# Patient Record
Sex: Female | Born: 1940
Health system: Southern US, Community
[De-identification: ages and names within clinical notes are randomized; demographics above are authoritative.]

## PROBLEM LIST (undated history)

## (undated) DIAGNOSIS — J189 Pneumonia, unspecified organism: Secondary | ICD-10-CM

## (undated) DIAGNOSIS — E785 Hyperlipidemia, unspecified: Secondary | ICD-10-CM

## (undated) DIAGNOSIS — K219 Gastro-esophageal reflux disease without esophagitis: Secondary | ICD-10-CM

## (undated) DIAGNOSIS — F32A Depression, unspecified: Secondary | ICD-10-CM

## (undated) DIAGNOSIS — N189 Chronic kidney disease, unspecified: Secondary | ICD-10-CM

## (undated) DIAGNOSIS — M353 Polymyalgia rheumatica: Secondary | ICD-10-CM

## (undated) DIAGNOSIS — M545 Low back pain, unspecified: Secondary | ICD-10-CM

## (undated) DIAGNOSIS — M1990 Unspecified osteoarthritis, unspecified site: Secondary | ICD-10-CM

## (undated) DIAGNOSIS — I10 Essential (primary) hypertension: Secondary | ICD-10-CM

## (undated) DIAGNOSIS — R42 Dizziness and giddiness: Secondary | ICD-10-CM

## (undated) DIAGNOSIS — F419 Anxiety disorder, unspecified: Secondary | ICD-10-CM

## (undated) DIAGNOSIS — Z8719 Personal history of other diseases of the digestive system: Secondary | ICD-10-CM

## (undated) DIAGNOSIS — I350 Nonrheumatic aortic (valve) stenosis: Secondary | ICD-10-CM

## (undated) DIAGNOSIS — J020 Streptococcal pharyngitis: Secondary | ICD-10-CM

## (undated) DIAGNOSIS — G8929 Other chronic pain: Secondary | ICD-10-CM

## (undated) DIAGNOSIS — M810 Age-related osteoporosis without current pathological fracture: Secondary | ICD-10-CM

## (undated) DIAGNOSIS — R011 Cardiac murmur, unspecified: Secondary | ICD-10-CM

## (undated) DIAGNOSIS — R0600 Dyspnea, unspecified: Secondary | ICD-10-CM

## (undated) DIAGNOSIS — F329 Major depressive disorder, single episode, unspecified: Secondary | ICD-10-CM

## (undated) HISTORY — DX: Age-related osteoporosis without current pathological fracture: M81.0

## (undated) HISTORY — DX: Chronic kidney disease, unspecified: N18.9

## (undated) HISTORY — DX: Nonrheumatic aortic (valve) stenosis: I35.0

## (undated) HISTORY — PX: CATARACT EXTRACTION W/ INTRAOCULAR LENS  IMPLANT, BILATERAL: SHX1307

## (undated) HISTORY — DX: Dizziness and giddiness: R42

## (undated) HISTORY — PX: JOINT REPLACEMENT: SHX530

## (undated) HISTORY — PX: TOTAL KNEE ARTHROPLASTY: SHX125

## (undated) HISTORY — DX: Streptococcal pharyngitis: J02.0

## (undated) HISTORY — DX: Hyperlipidemia, unspecified: E78.5

## (undated) HISTORY — DX: Unspecified osteoarthritis, unspecified site: M19.90

## (undated) HISTORY — DX: Essential (primary) hypertension: I10

## (undated) HISTORY — PX: BILATERAL CARPAL TUNNEL RELEASE: SHX6508

## (undated) HISTORY — PX: ABDOMINAL HYSTERECTOMY: SHX81

## (undated) HISTORY — PX: TOOTH EXTRACTION: SHX859

---

## 2001-04-03 HISTORY — PX: SHOULDER ARTHROSCOPY: SHX128

## 2002-03-03 ENCOUNTER — Encounter: Payer: Self-pay | Admitting: Orthopedic Surgery

## 2002-03-06 ENCOUNTER — Inpatient Hospital Stay (HOSPITAL_COMMUNITY): Admission: RE | Admit: 2002-03-06 | Discharge: 2002-03-11 | Payer: Self-pay | Admitting: Orthopedic Surgery

## 2002-03-06 DIAGNOSIS — Z96652 Presence of left artificial knee joint: Secondary | ICD-10-CM | POA: Insufficient documentation

## 2004-09-29 ENCOUNTER — Ambulatory Visit (HOSPITAL_BASED_OUTPATIENT_CLINIC_OR_DEPARTMENT_OTHER): Admission: RE | Admit: 2004-09-29 | Discharge: 2004-09-29 | Payer: Self-pay | Admitting: Orthopedic Surgery

## 2004-09-29 ENCOUNTER — Ambulatory Visit (HOSPITAL_COMMUNITY): Admission: RE | Admit: 2004-09-29 | Discharge: 2004-09-29 | Payer: Self-pay | Admitting: Orthopedic Surgery

## 2006-02-01 ENCOUNTER — Encounter: Admission: RE | Admit: 2006-02-01 | Discharge: 2006-02-01 | Payer: Self-pay | Admitting: Orthopedic Surgery

## 2006-02-27 ENCOUNTER — Encounter: Admission: RE | Admit: 2006-02-27 | Discharge: 2006-02-27 | Payer: Self-pay | Admitting: Orthopedic Surgery

## 2009-05-22 ENCOUNTER — Inpatient Hospital Stay (HOSPITAL_COMMUNITY): Admission: EM | Admit: 2009-05-22 | Discharge: 2009-05-22 | Payer: Self-pay | Admitting: Emergency Medicine

## 2009-06-01 ENCOUNTER — Encounter: Payer: Self-pay | Admitting: Cardiovascular Disease

## 2009-06-22 ENCOUNTER — Inpatient Hospital Stay (HOSPITAL_COMMUNITY): Admission: RE | Admit: 2009-06-22 | Discharge: 2009-06-26 | Payer: Self-pay | Admitting: Orthopedic Surgery

## 2009-06-22 DIAGNOSIS — Z96651 Presence of right artificial knee joint: Secondary | ICD-10-CM | POA: Insufficient documentation

## 2010-06-22 LAB — BASIC METABOLIC PANEL
BUN: 29 mg/dL — ABNORMAL HIGH (ref 6–23)
CO2: 24 mEq/L (ref 19–32)
Calcium: 8.7 mg/dL (ref 8.4–10.5)
Chloride: 103 mEq/L (ref 96–112)
Creatinine, Ser: 1.22 mg/dL — ABNORMAL HIGH (ref 0.4–1.2)
GFR calc Af Amer: 53 mL/min — ABNORMAL LOW (ref >60)
GFR calc non Af Amer: 44 mL/min — ABNORMAL LOW (ref >60)
Glucose, Bld: 131 mg/dL — ABNORMAL HIGH (ref 70–99)
Potassium: 4.2 mEq/L (ref 3.5–5.1)
Sodium: 133 mEq/L — ABNORMAL LOW (ref 135–145)

## 2010-06-22 LAB — URINE MICROSCOPIC-ADD ON

## 2010-06-22 LAB — DIFFERENTIAL
Basophils Absolute: 0 10*3/uL (ref 0.0–0.1)
Basophils Relative: 0 % (ref 0–1)
Eosinophils Absolute: 0 10*3/uL (ref 0.0–0.7)
Eosinophils Relative: 0 % (ref 0–5)
Lymphocytes Relative: 5 % — ABNORMAL LOW (ref 12–46)
Lymphs Abs: 0.9 10*3/uL (ref 0.7–4.0)
Monocytes Absolute: 0.9 10*3/uL (ref 0.1–1.0)
Monocytes Relative: 5 % (ref 3–12)
Neutro Abs: 15.3 10*3/uL — ABNORMAL HIGH (ref 1.7–7.7)
Neutrophils Relative %: 89 % — ABNORMAL HIGH (ref 43–77)

## 2010-06-22 LAB — APTT: aPTT: 28 seconds (ref 24–37)

## 2010-06-22 LAB — URINALYSIS, ROUTINE W REFLEX MICROSCOPIC
Bilirubin Urine: NEGATIVE
Glucose, UA: NEGATIVE mg/dL
Ketones, ur: NEGATIVE mg/dL
Nitrite: NEGATIVE
Protein, ur: NEGATIVE mg/dL
Specific Gravity, Urine: 1.012 (ref 1.005–1.030)
Urobilinogen, UA: 0.2 mg/dL (ref 0.0–1.0)
pH: 5 (ref 5.0–8.0)

## 2010-06-22 LAB — CK TOTAL AND CKMB (NOT AT ARMC)
CK, MB: 1.1 ng/mL (ref 0.3–4.0)
Relative Index: INVALID (ref 0.0–2.5)
Total CK: 63 U/L (ref 7–177)

## 2010-06-22 LAB — RAPID STREP SCREEN (MED CTR MEBANE ONLY): Streptococcus, Group A Screen (Direct): POSITIVE — AB

## 2010-06-22 LAB — CARDIAC PANEL(CRET KIN+CKTOT+MB+TROPI)
CK, MB: 1.1 ng/mL (ref 0.3–4.0)
Relative Index: INVALID (ref 0.0–2.5)
Total CK: 62 U/L (ref 7–177)
Troponin I: 0.02 ng/mL (ref 0.00–0.06)

## 2010-06-22 LAB — CBC
HCT: 33.3 % — ABNORMAL LOW (ref 36.0–46.0)
Hemoglobin: 11.5 g/dL — ABNORMAL LOW (ref 12.0–15.0)
MCHC: 34.5 g/dL (ref 30.0–36.0)
MCV: 93.3 fL (ref 78.0–100.0)
Platelets: 307 10*3/uL (ref 150–400)
RBC: 3.57 MIL/uL — ABNORMAL LOW (ref 3.87–5.11)
RDW: 14 % (ref 11.5–15.5)
WBC: 17.1 10*3/uL — ABNORMAL HIGH (ref 4.0–10.5)

## 2010-06-22 LAB — LIPID PANEL
Cholesterol: 175 mg/dL (ref 0–200)
HDL: 55 mg/dL (ref >39)
LDL Cholesterol: 105 mg/dL — ABNORMAL HIGH (ref 0–99)
Total CHOL/HDL Ratio: 3.2 RATIO
Triglycerides: 76 mg/dL (ref <150)
VLDL: 15 mg/dL (ref 0–40)

## 2010-06-22 LAB — TROPONIN I: Troponin I: 0.01 ng/mL (ref 0.00–0.06)

## 2010-06-22 LAB — POCT CARDIAC MARKERS
CKMB, poc: <1 ng/mL — ABNORMAL LOW (ref 1.0–8.0)
Myoglobin, poc: 81 ng/mL (ref 12–200)
Troponin i, poc: <0.05 ng/mL (ref 0.00–0.09)

## 2010-06-22 LAB — PROTIME-INR
INR: 1.06 (ref 0.00–1.49)
Prothrombin Time: 13.7 seconds (ref 11.6–15.2)

## 2010-06-26 LAB — COMPREHENSIVE METABOLIC PANEL
ALT: 13 U/L (ref 0–35)
ALT: 15 U/L (ref 0–35)
AST: 26 U/L (ref 0–37)
AST: 31 U/L (ref 0–37)
Albumin: 2.6 g/dL — ABNORMAL LOW (ref 3.5–5.2)
Albumin: 3.3 g/dL — ABNORMAL LOW (ref 3.5–5.2)
Alkaline Phosphatase: 62 U/L (ref 39–117)
Alkaline Phosphatase: 73 U/L (ref 39–117)
BUN: 31 mg/dL — ABNORMAL HIGH (ref 6–23)
BUN: 37 mg/dL — ABNORMAL HIGH (ref 6–23)
CO2: 23 mEq/L (ref 19–32)
CO2: 26 mEq/L (ref 19–32)
Calcium: 8.3 mg/dL — ABNORMAL LOW (ref 8.4–10.5)
Calcium: 8.5 mg/dL (ref 8.4–10.5)
Chloride: 109 mEq/L (ref 96–112)
Chloride: 110 mEq/L (ref 96–112)
Creatinine, Ser: 1.66 mg/dL — ABNORMAL HIGH (ref 0.4–1.2)
Creatinine, Ser: 1.95 mg/dL — ABNORMAL HIGH (ref 0.4–1.2)
GFR calc Af Amer: 31 mL/min — ABNORMAL LOW (ref >60)
GFR calc Af Amer: 37 mL/min — ABNORMAL LOW (ref >60)
GFR calc non Af Amer: 26 mL/min — ABNORMAL LOW (ref >60)
GFR calc non Af Amer: 31 mL/min — ABNORMAL LOW (ref >60)
Glucose, Bld: 102 mg/dL — ABNORMAL HIGH (ref 70–99)
Glucose, Bld: 121 mg/dL — ABNORMAL HIGH (ref 70–99)
Potassium: 5.6 mEq/L — ABNORMAL HIGH (ref 3.5–5.1)
Potassium: 6 mEq/L — ABNORMAL HIGH (ref 3.5–5.1)
Sodium: 137 mEq/L (ref 135–145)
Sodium: 141 mEq/L (ref 135–145)
Total Bilirubin: 0.3 mg/dL (ref 0.3–1.2)
Total Bilirubin: 0.6 mg/dL (ref 0.3–1.2)
Total Protein: 5.6 g/dL — ABNORMAL LOW (ref 6.0–8.3)
Total Protein: 6.7 g/dL (ref 6.0–8.3)

## 2010-06-26 LAB — URINALYSIS, ROUTINE W REFLEX MICROSCOPIC
Bilirubin Urine: NEGATIVE
Bilirubin Urine: NEGATIVE
Glucose, UA: NEGATIVE mg/dL
Glucose, UA: NEGATIVE mg/dL
Hgb urine dipstick: NEGATIVE
Hgb urine dipstick: NEGATIVE
Ketones, ur: NEGATIVE mg/dL
Ketones, ur: NEGATIVE mg/dL
Nitrite: NEGATIVE
Nitrite: NEGATIVE
Protein, ur: NEGATIVE mg/dL
Protein, ur: NEGATIVE mg/dL
Specific Gravity, Urine: 1.015 (ref 1.005–1.030)
Specific Gravity, Urine: 1.022 (ref 1.005–1.030)
Urobilinogen, UA: 0.2 mg/dL (ref 0.0–1.0)
Urobilinogen, UA: 0.2 mg/dL (ref 0.0–1.0)
pH: 5 (ref 5.0–8.0)
pH: 5.5 (ref 5.0–8.0)

## 2010-06-26 LAB — RENAL FUNCTION PANEL
Albumin: 2.8 g/dL — ABNORMAL LOW (ref 3.5–5.2)
BUN: 27 mg/dL — ABNORMAL HIGH (ref 6–23)
CO2: 26 mEq/L (ref 19–32)
Calcium: 8.1 mg/dL — ABNORMAL LOW (ref 8.4–10.5)
Chloride: 108 mEq/L (ref 96–112)
Creatinine, Ser: 1.32 mg/dL — ABNORMAL HIGH (ref 0.4–1.2)
GFR calc Af Amer: 48 mL/min — ABNORMAL LOW (ref >60)
GFR calc non Af Amer: 40 mL/min — ABNORMAL LOW (ref >60)
Glucose, Bld: 96 mg/dL (ref 70–99)
Phosphorus: 3.4 mg/dL (ref 2.3–4.6)
Potassium: 4.8 mEq/L (ref 3.5–5.1)
Sodium: 139 mEq/L (ref 135–145)

## 2010-06-26 LAB — DIFFERENTIAL
Basophils Absolute: 0 10*3/uL (ref 0.0–0.1)
Basophils Relative: 0 % (ref 0–1)
Eosinophils Absolute: 0.2 10*3/uL (ref 0.0–0.7)
Eosinophils Relative: 4 % (ref 0–5)
Lymphocytes Relative: 31 % (ref 12–46)
Lymphs Abs: 1.7 10*3/uL (ref 0.7–4.0)
Monocytes Absolute: 0.5 10*3/uL (ref 0.1–1.0)
Monocytes Relative: 10 % (ref 3–12)
Neutro Abs: 3 10*3/uL (ref 1.7–7.7)
Neutrophils Relative %: 55 % (ref 43–77)

## 2010-06-26 LAB — CBC
HCT: 23.8 % — ABNORMAL LOW (ref 36.0–46.0)
HCT: 25.9 % — ABNORMAL LOW (ref 36.0–46.0)
HCT: 27.1 % — ABNORMAL LOW (ref 36.0–46.0)
HCT: 27.3 % — ABNORMAL LOW (ref 36.0–46.0)
HCT: 27.5 % — ABNORMAL LOW (ref 36.0–46.0)
HCT: 34.8 % — ABNORMAL LOW (ref 36.0–46.0)
Hemoglobin: 11.1 g/dL — ABNORMAL LOW (ref 12.0–15.0)
Hemoglobin: 8 g/dL — ABNORMAL LOW (ref 12.0–15.0)
Hemoglobin: 8.8 g/dL — ABNORMAL LOW (ref 12.0–15.0)
Hemoglobin: 8.8 g/dL — ABNORMAL LOW (ref 12.0–15.0)
Hemoglobin: 9.1 g/dL — ABNORMAL LOW (ref 12.0–15.0)
Hemoglobin: 9.2 g/dL — ABNORMAL LOW (ref 12.0–15.0)
MCHC: 32 g/dL (ref 30.0–36.0)
MCHC: 32.4 g/dL (ref 30.0–36.0)
MCHC: 33 g/dL (ref 30.0–36.0)
MCHC: 33.7 g/dL (ref 30.0–36.0)
MCHC: 33.8 g/dL (ref 30.0–36.0)
MCHC: 34 g/dL (ref 30.0–36.0)
MCV: 94.5 fL (ref 78.0–100.0)
MCV: 94.6 fL (ref 78.0–100.0)
MCV: 94.8 fL (ref 78.0–100.0)
MCV: 95 fL (ref 78.0–100.0)
MCV: 95.4 fL (ref 78.0–100.0)
MCV: 96 fL (ref 78.0–100.0)
Platelets: 272 10*3/uL (ref 150–400)
Platelets: 274 10*3/uL (ref 150–400)
Platelets: 283 10*3/uL (ref 150–400)
Platelets: 296 10*3/uL (ref 150–400)
Platelets: 297 10*3/uL (ref 150–400)
Platelets: 354 10*3/uL (ref 150–400)
RBC: 2.51 MIL/uL — ABNORMAL LOW (ref 3.87–5.11)
RBC: 2.73 MIL/uL — ABNORMAL LOW (ref 3.87–5.11)
RBC: 2.87 MIL/uL — ABNORMAL LOW (ref 3.87–5.11)
RBC: 2.88 MIL/uL — ABNORMAL LOW (ref 3.87–5.11)
RBC: 2.89 MIL/uL — ABNORMAL LOW (ref 3.87–5.11)
RBC: 3.62 MIL/uL — ABNORMAL LOW (ref 3.87–5.11)
RDW: 13.6 % (ref 11.5–15.5)
RDW: 14.1 % (ref 11.5–15.5)
RDW: 14.3 % (ref 11.5–15.5)
RDW: 14.5 % (ref 11.5–15.5)
RDW: 14.5 % (ref 11.5–15.5)
RDW: 14.7 % (ref 11.5–15.5)
WBC: 10.2 10*3/uL (ref 4.0–10.5)
WBC: 5.4 10*3/uL (ref 4.0–10.5)
WBC: 6.9 10*3/uL (ref 4.0–10.5)
WBC: 7.2 10*3/uL (ref 4.0–10.5)
WBC: 7.5 10*3/uL (ref 4.0–10.5)
WBC: 8.4 10*3/uL (ref 4.0–10.5)

## 2010-06-26 LAB — BASIC METABOLIC PANEL
BUN: 37 mg/dL — ABNORMAL HIGH (ref 6–23)
BUN: 45 mg/dL — ABNORMAL HIGH (ref 6–23)
BUN: 46 mg/dL — ABNORMAL HIGH (ref 6–23)
CO2: 23 mEq/L (ref 19–32)
CO2: 24 mEq/L (ref 19–32)
CO2: 28 mEq/L (ref 19–32)
Calcium: 8.3 mg/dL — ABNORMAL LOW (ref 8.4–10.5)
Calcium: 8.5 mg/dL (ref 8.4–10.5)
Calcium: 9.3 mg/dL (ref 8.4–10.5)
Chloride: 109 mEq/L (ref 96–112)
Chloride: 109 mEq/L (ref 96–112)
Chloride: 109 mEq/L (ref 96–112)
Creatinine, Ser: 1.6 mg/dL — ABNORMAL HIGH (ref 0.4–1.2)
Creatinine, Ser: 1.91 mg/dL — ABNORMAL HIGH (ref 0.4–1.2)
Creatinine, Ser: 2.21 mg/dL — ABNORMAL HIGH (ref 0.4–1.2)
GFR calc Af Amer: 27 mL/min — ABNORMAL LOW (ref >60)
GFR calc Af Amer: 32 mL/min — ABNORMAL LOW (ref >60)
GFR calc Af Amer: 39 mL/min — ABNORMAL LOW (ref >60)
GFR calc non Af Amer: 22 mL/min — ABNORMAL LOW (ref >60)
GFR calc non Af Amer: 26 mL/min — ABNORMAL LOW (ref >60)
GFR calc non Af Amer: 32 mL/min — ABNORMAL LOW (ref >60)
Glucose, Bld: 101 mg/dL — ABNORMAL HIGH (ref 70–99)
Glucose, Bld: 129 mg/dL — ABNORMAL HIGH (ref 70–99)
Glucose, Bld: 68 mg/dL — ABNORMAL LOW (ref 70–99)
Potassium: 5.7 mEq/L — ABNORMAL HIGH (ref 3.5–5.1)
Potassium: 6.6 mEq/L (ref 3.5–5.1)
Potassium: 6.7 mEq/L (ref 3.5–5.1)
Sodium: 135 mEq/L (ref 135–145)
Sodium: 136 mEq/L (ref 135–145)
Sodium: 144 mEq/L (ref 135–145)

## 2010-06-26 LAB — ABO/RH: ABO/RH(D): O POS

## 2010-06-26 LAB — PROTIME-INR
INR: 0.98 (ref 0.00–1.49)
Prothrombin Time: 12.9 seconds (ref 11.6–15.2)

## 2010-06-26 LAB — POTASSIUM: Potassium: 5.5 mEq/L — ABNORMAL HIGH (ref 3.5–5.1)

## 2010-06-26 LAB — OSMOLALITY, URINE: Osmolality, Ur: 466 mOsm/kg (ref 390–1090)

## 2010-06-26 LAB — CREATININE, URINE, RANDOM: Creatinine, Urine: 55.4 mg/dL

## 2010-06-26 LAB — TYPE AND SCREEN
ABO/RH(D): O POS
Antibody Screen: NEGATIVE

## 2010-06-26 LAB — PHOSPHORUS: Phosphorus: 3.5 mg/dL (ref 2.3–4.6)

## 2010-06-26 LAB — APTT: aPTT: 28 seconds (ref 24–37)

## 2010-06-26 LAB — SODIUM, URINE, RANDOM: Sodium, Ur: 106 mEq/L

## 2010-08-19 NOTE — Op Note (Signed)
NAME:  Tracey Walton, Tracey Walton                        ACCOUNT NO.:  [id number]   MEDICAL RECORD NO.:  [id number]                   PATIENT TYPE:  INP   LOCATION:  0002                                 FACILITY:  [facility]   PHYSICIAN:  Vania Rea. Supple, M.D.               DATE OF BIRTH:  [birth year]   DATE OF PROCEDURE:  03/06/2002  DATE OF DISCHARGE:                                 OPERATIVE REPORT   PREOPERATIVE DIAGNOSES:  Left knee end-stage osteoarthrosis.   POSTOPERATIVE DIAGNOSES:  Left knee end-stage osteoarthrosis.   PROCEDURE:  Cemented Osteonics [place] posterior stabilized total knee  arthroplasty with a size 7 femur, size 7 tibia, 12 mm thick polyethylene  insert and a 26 mm patella.   SURGEON:  Vania Rea. Supple, M.D.   Threasa HeadsFrench Ana A. Shuford, P.A.-C.   ANESTHESIA:  General endotracheal.   TOURNIQUET TIME:  Approximately one hour.   ESTIMATED BLOOD LOSS:  150 cc   DRAINS:  Hemovac x1.   HISTORY OF PRESENT ILLNESS:  Tracey Walton is a 70 year old female whose had  chronic and progressively increasing left knee pain and functional  limitations with an examination showing diffuse joint line tenderness and  painful arc of motion. Radiographs confirm end-stage osteoarthrosis with  primarily medial compartment involvement and slight varus alignment. Due to  her persistent pain and increasing functional limitations, she is brought to  the operating room at this time for planned left total knee arthroplasty.   Preoperatively she was counselled on treatment options as well as risks  versus benefits thereof, possible complications of bleeding, infection,  neurovascular injury, DVT, PE, persistence of pain, loss of motion, and  possible need for revision surgery as well as potential anesthetic  complications reviewed. The patient understands and accepts and agrees with  our planned procedure.   DESCRIPTION OF PROCEDURE:  After undergoing preoperative evaluation, the  patient  received prophylactic antibiotics. She was placed supine on the  operating table and underwent smooth induction of general endotracheal  anesthesia. A Foley catheter was placed. The tourniquet was applied to the  left thigh and left leg was sterilely prepped and draped in standard  fashion. The leg was exsanguinated with the tourniquet inflated to 350 mmHg.  An anterior midline incision was then made from four fingerbreadths above  the patella to just medial to the tibial tubercle. Total length  approximately 20 cm. Skin flaps elevated medially and laterally with  electrocautery used for hemostasis. A median parapatellar arthrotomy was  then made and the patella was everted, approximately 1/2 the fat pad was  excised. Remnants of the ACL were divided and incised. The knee was then  flexed up and the medial and lateral menisci were removed. A drill was then  used to gain entrance into the femoral canal and intermedullary guide was  then placed and a distal femoral cut was then made removing 1 cm from the  distal femur with a 5 degree valgus alignment. The distal femoral sizing  guide was then placed and a size 7 had the appropriate fit. The distal  femoral cutting guide was then applied and the appropriate cuts were then  made from the distal femur. The trochlear groove cutting guide was then  placed. We notched the trochlear groove as well as the intercondylar notch  box cut utilizing the appropriate devices. The trial size 7 distal femoral  implant was then placed with good fit. The knee was then flexed up and the  proximal tibia was exposed. The tibial eminences were removed with an  oscillating saw. Access was then gained into the intermedullary canal with a  drill and intermedullary guide was then passed. We made a 5 degree posterior  slope cut removing 6 mm from the defect on the medial tibial plateau. A  trial tibial tray was then placed and proper rotation of the tibial tray was   then determined. The proximal tibia was then exposed and we used the keel  cutting broach to sequentially broach up to the appropriate sized keep for  the tibial tray. Once this was completed, the trial implants were removed.  Our attention was turned to the patella where we utilized the recessed  patella cutting guide to create a 26 mm diameter recess patellar cut out and  three stabilizing drill holes. Meticulous debridement of residual soft  tissue and bony debris was then performed. Pulsatile lavage was used to  meticulously clean the joint. The cement was then mixed at the back table  and at the appropriate consistency it was used to place the implants  beginning with the tibia, then the femur and then the patella. After the  cement was appropriately hardened, we meticulously removed extra cement. The  knee was taken through a range of motion with the size 10 and 12 trials and  the 12 mm implant gave excellent soft tissue balance and full knee motion  and good stability. I should mention that prior to cementing the implants we  did remove osteophytes from the posterior aspect of the femoral condyles and  meticulously debrided the posterior aspect of the joint. Once the trial  implant was removed, we did a final inspection of the posterior aspect of  the joint and found no obvious additional debris. The final tibial insert  was then opened and adapted, the tibial tray was meticulously cleaned. It  was tapped into position. The knee was then taken through a final range of  motion  showing excellent stability. The patella showed excellent tracking  so a lateral release was not required. The Hemovac drain was then brought  out laterally. At this point, the tourniquet was let down and we used  electrocautery for hemostasis. The wound was then closed with a running #1  Vicryl for the synovium and interrupted figure-of-eight #1 Vicryl for the arthrotomy, 2-0 Vicryl to the subcu and 3-0  Monocryl running intracuticular  used to close the skin. Steri-Strips were then applied. A combination of  Marcaine with epinephrine was instilled into the knee joint through the  drain at the end of the case. The leg was wrapped with a bulky dry dressing  from foot to thigh with an Ace bandage. A knee immobilizer was applied. The  patient was then extubated and taken to the recovery room in stable  condition.  Vania Rea. Supple, M.D.    KMS/MEDQ  D:  03/06/2002  T:  03/06/2002  Job:  [id number]

## 2010-08-19 NOTE — Discharge Summary (Signed)
NAME:  DIEDRA, SINOR                        ACCOUNT NO.:  [id number]   MEDICAL RECORD NO.:  [id number]                   PATIENT TYPE:  INP   LOCATION:  0483                                 FACILITY:  [facility]   PHYSICIAN:  Vania Rea. Supple, M.D.               DATE OF BIRTH:  [birth year]   DATE OF ADMISSION:  03/06/2002  DATE OF DISCHARGE:  03/11/2002                                 DISCHARGE SUMMARY   ADMISSION DIAGNOSES:  1. End-stage osteoarthritis of the left knee.  2. Hypercholesterolemia.   DISCHARGE DIAGNOSES:  1. End-stage osteoarthritis of the left knee, status post left total knee     replacement.  2. Hypercholesterolemia.  3. Postoperative hemorrhagic anemia.   PROCEDURE:  The patient was taken to the operating room on 03/06/02, and  underwent a left total knee replacement by Dr. Francena Hanly.  Assistant was  [employer]. Shuford, P.A.-C.  Surgery was done under general anesthesia, and a  Hemovac drain x1 was placed at the time of surgery.   CONSULTATIONS:  1. Pharmacy for Coumadin management.  2. Physical therapy.  3. Occupational therapy.   HISTORY OF PRESENT ILLNESS:  The patient is a 70 year old female who has  progressive pain and deformity to the left knee.  She has undergone previous  Synvisc injections.  She has also had previous knee intra-articular  Cortizone injections without significant relief.  It has all been very  temporary.  She has been noted to had end-stage osteoarthritis on x-ray with  left knee being greater then the right.  At this time, she has failed  conservative outpatient management and wishes to proceed with total knee  arthroplasty.  Risks and benefits have been discussed at length with the  patient, and at this time she is ready to proceed.   LABORATORY DATA:  CBC on admission showed a hemoglobin of 13.2, hematocrit  38.6, white blood cell count 5.7, red blood cell count 4.20.  Serial  hemoglobins and hematocrits were followed throughout  hospital stay.  Hemoglobin and hematocrit did decline to 9.4 and 27.0 on 03/11/02, but were  stable at the time of discharge.  Differential on admission was all within  normal limits.  Coagulation studies on admission were all within normal  limits.  PT and INR at the time of discharge were 19.9 and 1.9 on Coumadin  therapy.  Routine chemistries on admission were all within normal limits.  Followup chemistry on 03/07/02, showed a glucose slightly high at 133.  Urinalysis on admission showed urobilinogen high at 2.0.  The patient's  blood type is O positive with antibody screen negative.  EKG on admission  showed normal sinus rhythm with normal electrocardiogram.  Preoperative knee  x-rays revealed marked degenerative changes of the left knee.   HOSPITAL COURSE:  The patient was admitted to [facility] and taken  to the operating room.  She underwent the above stated  procedure without  complications.  The patient tolerated the procedure well, allowed to return  to the recovery room and then to the orthopaedic floor for continued  postoperative care.  On postoperative day #1, seen by orthopaedics.  The  patient was doing okay overall.  She had nausea with Percocet.  Stated that  Darvocet worked in the past.  Hemoglobin was 10.2.  Hemovac discontinued on  this day.  Pain medication changed to Darvocet, and clonidine 0.1 mg b.i.d.  was ordered for a blood pressure greater then 160/90.  On 03/08/02,  postoperative day #2, the patient was resting comfortably, had no major  complaints, temperature max was 102, she was afebrile at the time being  seen.  Hemoglobin and hematocrit were 10.0 and 29.3.  Dressing was changed.  She was continued with physical therapy and occupational therapy, IV and PCA  were discontinued on this day.  Bedside incentive spirometer was encouraged  q.1h. while awake.  On 03/09/02, seen by orthopaedics.  The patient was doing  well, had no change.  Hemoglobin was  10.1.  On 03/10/02, postoperative day  #4, seen by orthopaedics.  Temperature max of 100.6, minimal pain.  Incision  was clean, dry, and intact.  Had fair range of motion.  Plan was to be  discharged on 03/11/02.  On 03/11/02, seen by orthopaedics.  The patient was  doing well.  Had no complaints, was ready for discharge, and was discharged  home on this date.   DISPOSITION:  The patient was discharged home on 03/11/02.   DISCHARGE MEDICATIONS:  1. Percocet 5/325 mg one or two p.o. q.4-6h. p.r.n. pain.  2. Ambien 10 mg one p.o. q.h.s. p.r.n.  3. Robaxin 500 mg one p.o. q.6-8h. p.r.n.  4. Coumadin 5 mg take 1-1/2 tablets daily at 6 p.m.   DIET:  As tolerated.   ACTIVITY:  The patient is weightbearing as tolerated.  [employer] for home  care.  Knee precautions.   FOLLOWUP:  The patient is to follow up two weeks from the date of surgery  with Dr. Rennis Chris.  She is to call the office for an appointment.   CONDITION ON DISCHARGE:  Stable and improved.     Clarene Reamer, P.A.-C.                   Vania Rea. Supple, M.D.    SW/MEDQ  D:  03/24/2002  T:  03/24/2002  Job:  [id number]

## 2010-08-19 NOTE — H&P (Signed)
NAME:  Tracey Walton, Tracey Walton                        ACCOUNT NO.:  [id number]   MEDICAL RECORD NO.:  [id number]                   PATIENT TYPE:  INP   LOCATION:  NA                                   FACILITY:  [facility]   PHYSICIAN:  Vania Rea. Supple, M.D.               DATE OF BIRTH:  [birth year]   DATE OF ADMISSION:  03/06/2002  DATE OF DISCHARGE:                                HISTORY & PHYSICAL   CHIEF COMPLAINT:  Left knee pain.   HISTORY OF PRESENT ILLNESS:  The patient is a 70 year old female who has had  progressive and pain and deformity into her left knee.  She has undergone  previous Synvisc injections.  She has also had previous knee intra-articular  Cortizone injections without significant relief.  These have all been very  temporary.  She has been noted to have end-stage osteoarthritis on x-ray  with the left being greater then the right.  At this time, she has failed  conservative outpatient measures, and wishes to proceed with total knee  arthroplasty.  Risks and benefits have been discussed at length with the  patient, and at this time she is ready to proceed.   PAST MEDICAL HISTORY:  1. Hypercholesterolemia.  2. The patient was seen by Dr. Donnie Aho, cardiologist, on 02/17/02, and     cleared for surgery.   PAST SURGICAL HISTORY:  1. Hysterectomy.  2. Right shoulder arthroscopy.   MEDICATIONS:  1. Lipitor 40 mg q.d.  2. Vioxx 25 mg q.d.   ALLERGIES:  1. PENICILLIN causes a rash.  2. MORPHINE causes itching.   FAMILY HISTORY:  Mother with heart disease.   REVIEW OF SYMPTOMS:  The patient denies any recent fevers, chills, night  sweats, no bleeding tendencies.  CNS:  No blurred, double vision, seizures,  paralysis.  RESPIRATORY:  No shortness of breath, productive cough, or  hemoptysis.  CARDIOVASCULAR:  No chest pain, angina, or orthopnea.  GASTROINTESTINAL:  No nausea, vomiting, constipation, diarrhea, melena, or  bloody stools.  GENITOURINARY:  No dysuria,  hematuria.  MUSCULOSKELETAL:  As  pertinent to present illness.   SOCIAL HISTORY:  The patient is widowed and lives alone in a one story home.  Her sister has had recently had bilateral total knees and will have some  family available postoperatively.   PHYSICAL EXAMINATION:  GENERAL:  A well-developed, well-nourished 60-year-  old female.  VITAL SIGNS:  Blood pressure 142/82.  HEENT:  Normocephalic, atraumatic.  NECK:  Supple, no lymphadenopathy, no carotid bruits.  CHEST:  Clear to auscultation bilaterally.  No rales, no rhonchi.  HEART:  Regular rate and rhythm, no murmurs, rubs, gallops, heaves, or  thrills.  ABDOMEN:  Positive bowel sounds, soft, nontender.  EXTREMITIES:  Bilateral knees with crepitus, and tenderness along the  bilateral joint lines.  Her left knee is mild effusion.   IMPRESSION:  1. End-stage osteoarthritis of the left greater then right  knee.  2. Hypercholesterolemia.   PLAN:  Left total knee arthroplasty.  Risks and benefits were discussed with  the patient at length, and she wishes to proceed.       Tracy A. Shuford, P.A.-C.                 Vania Rea. Supple, M.D.    TAS/MEDQ  D:  03/05/2002  T:  03/05/2002  Job:  [id number]

## 2010-08-19 NOTE — Op Note (Signed)
NAMESELMA, [name]              ACCOUNT NO.:  [id number]   MEDICAL RECORD NO.:  [id number]          PATIENT TYPE:  AMB   LOCATION:  DSC                          FACILITY:  [facility]   PHYSICIAN:  Katy Fitch. Sypher, M.D. DATE OF BIRTH:  [birth year]   DATE OF PROCEDURE:  09/29/2004  DATE OF DISCHARGE:                                 OPERATIVE REPORT   PREOPERATIVE DIAGNOSIS:  Chronic entrapment neuropathy, median nerve left  carpal tunnel.   POSTOPERATIVE DIAGNOSIS:  Chronic entrapment neuropathy, median nerve left  carpal tunnel.   OPERATION:  Release of left transcarpal ligament.   OPERATIVE SURGEON:  Josephine Igo, M.D.   ASSISTANT:  Annye Rusk, PA-C   ANESTHESIA:  General by LMA.   SUPERVISING ANESTHESIOLOGIST:  Dr. Gelene [name].   INDICATION:  Tracey Walton is a 70 year old woman referred for evaluation  and management of and numb and painful left hand. Clinical examination  revealed signs of carpal tunnel syndrome. Electrodiagnostic completed by Dr.  Julius Bowels confirmed median neuropathy at the level of left wrist.  Due to a  failure to respond to nonoperative measures, she is brought to the operating  at this time for release of her left transcarpal ligament.   PROCEDURE:  Tracey Walton is brought to the operating room and placed in  supine position on the table.  Following the induction of general anesthesia  by LMA, the left arm was prepped with Betadine soap and solution, sterilely  draped.  Following exsanguination of the left arm an Esmarch bandage and  arterial tourniquet on the proximal brachium, it was inflated to 220 mmHg.   Procedure commenced with short incision in the line the ring finger in the  palm.  The subcutaneous tissue were carefully divided revealing the palmar  fascia. This was split longitudinally to the medial of the common sensory  branch of the median nerve. This was followed back to transcarpal ligament  which was carefully isolated from the  nerve. The ligament was released along  its ulnar border extending into the distal forearm. This widely opened the  carpal canal. No mass or other predicaments were noted. Bleeding points were  not evident. This wound was then repaired with intradermal 3-0 Prolene  suture.  A compressive dressing applied with a volar plaster splint  maintaining the wrist in 5 degrees dorsiflexion.  For aftercare [name]  is provided a prescription for Percocet 5 milligrams one p.o. q.4-6h. 6  hours, pain 20 tablets without refill.     RVS/MEDQ  D:  09/29/2004  T:  09/29/2004  Job:  [id number]

## 2011-07-14 ENCOUNTER — Encounter: Payer: Self-pay | Admitting: *Deleted

## 2012-09-30 DIAGNOSIS — M545 Low back pain, unspecified: Secondary | ICD-10-CM | POA: Insufficient documentation

## 2012-09-30 DIAGNOSIS — G8929 Other chronic pain: Secondary | ICD-10-CM | POA: Insufficient documentation

## 2013-01-04 DIAGNOSIS — M353 Polymyalgia rheumatica: Secondary | ICD-10-CM | POA: Insufficient documentation

## 2014-02-16 ENCOUNTER — Other Ambulatory Visit (HOSPITAL_COMMUNITY): Payer: Self-pay | Admitting: Orthopedic Surgery

## 2014-02-16 DIAGNOSIS — M25562 Pain in left knee: Secondary | ICD-10-CM

## 2014-02-19 ENCOUNTER — Other Ambulatory Visit: Payer: Self-pay | Admitting: Gastroenterology

## 2014-02-19 DIAGNOSIS — R1310 Dysphagia, unspecified: Secondary | ICD-10-CM

## 2014-02-23 ENCOUNTER — Encounter (HOSPITAL_COMMUNITY)
Admission: RE | Admit: 2014-02-23 | Discharge: 2014-02-23 | Disposition: A | Discharge status: Home / Self Care | Payer: Medicare Other | Source: Ambulatory Visit | Attending: Orthopedic Surgery | Admitting: Orthopedic Surgery

## 2014-02-23 DIAGNOSIS — M25562 Pain in left knee: Secondary | ICD-10-CM

## 2014-02-23 MED ORDER — TECHNETIUM TC 99M MEDRONATE IV KIT
25.0000 | PACK | Freq: Once | INTRAVENOUS | Status: AC | PRN
  Administered 2014-02-23: 25 via INTRAVENOUS

## 2014-02-24 ENCOUNTER — Other Ambulatory Visit: Payer: Medicare Other

## 2014-03-06 ENCOUNTER — Ambulatory Visit
Admission: RE | Admit: 2014-03-06 | Discharge: 2014-03-06 | Disposition: A | Discharge status: Home / Self Care | Payer: Medicare Other | Source: Ambulatory Visit | Attending: Gastroenterology | Admitting: Gastroenterology

## 2014-03-06 DIAGNOSIS — R1310 Dysphagia, unspecified: Secondary | ICD-10-CM

## 2014-05-29 ENCOUNTER — Ambulatory Visit (INDEPENDENT_AMBULATORY_CARE_PROVIDER_SITE_OTHER): Payer: Medicare Other | Admitting: Cardiovascular Disease

## 2014-05-29 ENCOUNTER — Encounter: Payer: Self-pay | Admitting: Cardiovascular Disease

## 2014-05-29 VITALS — BP 160/100 | HR 79 | Ht 64.5 in | Wt 228.0 lb

## 2014-05-29 DIAGNOSIS — R079 Chest pain, unspecified: Secondary | ICD-10-CM

## 2014-05-29 DIAGNOSIS — R0789 Other chest pain: Secondary | ICD-10-CM | POA: Insufficient documentation

## 2014-05-29 DIAGNOSIS — Z13220 Encounter for screening for lipoid disorders: Secondary | ICD-10-CM

## 2014-05-29 DIAGNOSIS — R011 Cardiac murmur, unspecified: Secondary | ICD-10-CM | POA: Diagnosis not present

## 2014-05-29 DIAGNOSIS — E785 Hyperlipidemia, unspecified: Secondary | ICD-10-CM

## 2014-05-29 DIAGNOSIS — I10 Essential (primary) hypertension: Secondary | ICD-10-CM | POA: Insufficient documentation

## 2014-05-29 LAB — LIPID PANEL
Cholesterol: 238 mg/dL — ABNORMAL HIGH (ref 0–200)
HDL: 45.5 mg/dL (ref >39.00)
LDL Cholesterol: 166 mg/dL — ABNORMAL HIGH (ref 0–99)
NonHDL: 192.5
Total CHOL/HDL Ratio: 5
Triglycerides: 131 mg/dL (ref 0.0–149.0)
VLDL: 26.2 mg/dL (ref 0.0–40.0)

## 2014-05-29 LAB — HEPATIC FUNCTION PANEL
ALT: 15 U/L (ref 0–35)
AST: 19 U/L (ref 0–37)
Albumin: 4.3 g/dL (ref 3.5–5.2)
Alkaline Phosphatase: 87 U/L (ref 39–117)
Bilirubin, Direct: 0.1 mg/dL (ref 0.0–0.3)
Total Bilirubin: 0.5 mg/dL (ref 0.2–1.2)
Total Protein: 7.9 g/dL (ref 6.0–8.3)

## 2014-05-29 LAB — MAGNESIUM: Magnesium: 2 mg/dL (ref 1.5–2.5)

## 2014-05-29 LAB — BASIC METABOLIC PANEL
BUN: 32 mg/dL — ABNORMAL HIGH (ref 6–23)
CO2: 32 mEq/L (ref 19–32)
Calcium: 10.3 mg/dL (ref 8.4–10.5)
Chloride: 99 mEq/L (ref 96–112)
Creatinine, Ser: 1.2 mg/dL (ref 0.40–1.20)
GFR: 46.78 mL/min — ABNORMAL LOW (ref >60.00)
Glucose, Bld: 100 mg/dL — ABNORMAL HIGH (ref 70–99)
Potassium: 4.1 mEq/L (ref 3.5–5.1)
Sodium: 137 mEq/L (ref 135–145)

## 2014-05-29 LAB — TSH: TSH: 0.86 u[IU]/mL (ref 0.35–4.50)

## 2014-05-29 MED ORDER — CARVEDILOL 6.25 MG PO TABS: 6.2500 mg | ORAL_TABLET | Freq: Two times a day (BID) | ORAL | Status: DC

## 2014-05-29 NOTE — Patient Instructions (Addendum)
Your physician has recommended you make the following change in your medication:  START Carvedilol (Coreg) 6.25 mg twice daily - take 12 hours apart  Your physician recommends that you have lab work: TODAY - BMET, Magnesium, TSH, Liver function, cholesterol  Your physician has requested that you have a lexiscan myoview. For further information please visit [email or web address]. Please follow instruction sheet, as given.  Your physician has requested that you have an echocardiogram. Echocardiography is a painless test that uses sound waves to create images of your heart. It provides your doctor with information about the size and shape of your heart and how well your heart's chambers and valves are working. This procedure takes approximately one hour. There are no restrictions for this procedure.  Your physician recommends that you schedule a follow-up appointment in: 1 month with Dr. Elease HashimotoNahser

## 2014-05-29 NOTE — Progress Notes (Signed)
Cardiology Office Note   Date:  05/29/2014   ID:  KC SEDLAK, DOB 1941/03/15, MRN [id number]  PCP:  No PCP Per Patient  Cardiologist:   Vesta Mixer, MD   Chief Complaint  Patient presents with  . Hypertension   Problem List 1. Essential HTN  2. Chest pressure 3. Arthritis 4. Heart murmur 5. Hyperlipidemia 6. Hiatal hernia    History of Present Illness: Tracey Walton is a 74 y.o. female who presents for further evaluation of her HTN Has had HTN for many years.  Her medical doctors at the no longer [facility] have had difficulty getting her blood pressure controlled. She has occasional episodes of chest pressure. These chest pain  Tend occur with exertion and are relieved with rest. They last for several minutes. There is no radiation.  She has chronic shortness of breath but the chest pains are not necessarily associated with worsening shortness breath.  She does not get any regular exercise. She tries to avoid eating excessive salt but admits that she still eats occasional salty foods.  She's had hyperlipidemia for the past 7 or 8 years. She is currently on Pravachol.    Past Medical History  Diagnosis Date  . Strep throat   . Chest pain 2011    negative for MI  . Hypertension   . Hyperlipidemia   . Chronic kidney disease     renal insufficiency  . Arthritis     Past Surgical History  Procedure Laterality Date  . Partial hysterectomy    . Carpal tunnel repair      left 2005 right 2003 surgery  . Right shoulder arthroscopy  2003     Current Outpatient Prescriptions  Medication Sig Dispense Refill  . amLODipine (NORVASC) 2.5 MG tablet Take 2.5 mg by mouth daily.    . calcium-vitamin D (OSCAL WITH D) 500-200 MG-UNIT per tablet Take 1 tablet by mouth daily.    . fish oil-omega-3 fatty acids 1000 MG capsule Take 1,200 mg by mouth daily.    Marland Kitchen FLUoxetine (PROZAC) 20 MG capsule Take 20 mg by mouth daily.    Marland Kitchen lisinopril-hydrochlorothiazide  (PRINZIDE,ZESTORETIC) 20-25 MG per tablet Take 1 tablet by mouth daily.    . Magnesium 250 MG TABS Take 250 mg by mouth daily.    . naproxen sodium (ANAPROX) 220 MG tablet Take 220 mg by mouth daily as needed. AS NEEDED FOR MILD PAIN    . omeprazole (PRILOSEC) 20 MG capsule Take 20 mg by mouth daily.    . pravastatin (PRAVACHOL) 40 MG tablet Take 40 mg by mouth daily.    . traMADol (ULTRAM) 50 MG tablet Take 50 mg by mouth every 8 (eight) hours as needed. AS NEEDED FOR MILD PAIN     No current facility-administered medications for this visit.    Allergies:   Penicillins    Social History:  The patient  reports that she has never smoked. She has never used smokeless tobacco. She reports that she does not drink alcohol or use illicit drugs.   Family History:  The patient's family history includes Heart attack in her mother; Heart disease in her mother; Heart failure in her father; Hyperlipidemia in her father and sister; Hypertension in her father and sister; Kidney failure in her father.    ROS:  Please see the history of present illness.    Review of Systems: Constitutional:  denies fever, chills, diaphoresis, appetite change and fatigue.  HEENT: denies photophobia, eye pain, redness,  hearing loss, ear pain, congestion, sore throat, rhinorrhea, sneezing, neck pain, neck stiffness and tinnitus.  Respiratory: admits to SOB, DOE,   Cardiovascular: admits to chest pain, palpitations . Has some leg swelling.  Gastrointestinal: denies nausea, vomiting, abdominal pain, diarrhea, constipation, blood in stool.  Genitourinary: denies dysuria, urgency, frequency, hematuria, flank pain and difficulty urinating.  Musculoskeletal: admits to  back pain, joint swelling, arthralgias    Skin: denies pallor, rash and wound.  Neurological: denies dizziness, seizures, syncope, weakness, light-headedness, numbness and headaches.   Hematological: denies adenopathy, easy bruising, personal or family bleeding  history.  Psychiatric/ Behavioral: denies suicidal ideation, mood changes, confusion, nervousness, sleep disturbance and agitation.       All other systems are reviewed and negative.    PHYSICAL EXAM: VS:  BP 160/100 mmHg  Pulse 79  Ht 5' 4.5" (1.638 m)  Wt 228 lb (103.42 kg)  BMI 38.55 kg/m2 , BMI Body mass index is 38.55 kg/(m^2). GEN: Well nourished, well developed, in no acute distress HEENT: normal Neck: no JVD, carotid bruits, or masses Cardiac: RRR; no murmurs, rubs, or gallops,no edema  Respiratory:  clear to auscultation bilaterally, normal work of breathing GI: soft, nontender, nondistended, + BS MS: no deformity or atrophy Skin: warm and dry, no rash Neuro:  Strength and sensation are intact Psych: normal   EKG:  EKG is ordered today. The ekg ordered today demonstrates normal sinus rhythm at 79. She has poor R-wave progression that is probably due to lead placement. She has tiny inferior, nonsignificant Q waves.   Recent Labs: No results found for requested labs within last 365 days.    Lipid Panel    Component Value Date/Time   CHOL  05/22/2009 1417    175        ATP III CLASSIFICATION:  <200     mg/dL   Desirable  [id number]  mg/dL   Borderline High  >=045>=240    mg/dL   High          TRIG 76 05/22/2009 1417   HDL 55 05/22/2009 1417   CHOLHDL 3.2 05/22/2009 1417   VLDL 15 05/22/2009 1417   LDLCALC * 05/22/2009 1417    105        Total Cholesterol/HDL:CHD Risk Coronary Heart Disease Risk Table                     Men   Women  1/2 Average Risk   3.4   3.3  Average Risk       5.0   4.4  2 X Average Risk   9.6   7.1  3 X Average Risk  23.4   11.0        Use the calculated Patient Ratio above and the CHD Risk Table to determine the patient's CHD Risk.        ATP III CLASSIFICATION (LDL):  <100     mg/dL   Optimal  [id number]  mg/dL   Near or Above                    Optimal  130-159  mg/dL   Borderline  [id number]  mg/dL   High  >956>190     mg/dL   Very  High      Wt Readings from Last 3 Encounters:  05/29/14 228 lb (103.42 kg)      Other studies Reviewed: Additional studies/ records that were reviewed today include: . Review of the above records  demonstrates:    ASSESSMENT AND PLAN:  1. Essential HTN  - she presents with poorly controlled hypertension. I suspect a lot of this is her dietary indiscretion. We had a long talk about high salt foods. She will work on her diet. We'll start her on carvedilol 6.25 mg twice a day. We will see her again in one month for further evaluation  2. Chest pressure- she has some chest pressure that sounds somewhat typical and at other times atypical. We'll get a 2 day axis can Myoview study for further evaluation.  3. Arthritis 4. Heart murmur - she has a soft systolic murmur and also has a soft diastolic murmur. She may have some degree of aortic insufficiency/aortic stenosis. We will get an echo cardiogram for further evaluation.  5. Hyperlipidemia - she has a history of hyperlipidemia. She's currently on Pravachol. We'll follow up with further levels at a later time.  6. Hiatal hernia   Current medicines are reviewed at length with the patient today.  The patient does not have concerns regarding medicines.  The following changes have been made:  no change   Disposition:   FU with me 1 month    Signed, Shalese Strahan, Deloris Ping, MD  05/29/2014 10:17 AM    [facility] [address] [place], [place], [place]  [address] Phone: [phone]; Fax: [phone]

## 2014-06-19 ENCOUNTER — Ambulatory Visit (HOSPITAL_BASED_OUTPATIENT_CLINIC_OR_DEPARTMENT_OTHER): Payer: Medicare Other | Admitting: Radiology

## 2014-06-19 ENCOUNTER — Ambulatory Visit (HOSPITAL_COMMUNITY): Payer: Medicare Other | Attending: Cardiology | Admitting: Radiology

## 2014-06-19 DIAGNOSIS — I10 Essential (primary) hypertension: Secondary | ICD-10-CM

## 2014-06-19 DIAGNOSIS — E785 Hyperlipidemia, unspecified: Secondary | ICD-10-CM | POA: Diagnosis not present

## 2014-06-19 DIAGNOSIS — E669 Obesity, unspecified: Secondary | ICD-10-CM | POA: Diagnosis not present

## 2014-06-19 DIAGNOSIS — R011 Cardiac murmur, unspecified: Secondary | ICD-10-CM

## 2014-06-19 DIAGNOSIS — R079 Chest pain, unspecified: Secondary | ICD-10-CM | POA: Diagnosis not present

## 2014-06-19 DIAGNOSIS — I340 Nonrheumatic mitral (valve) insufficiency: Secondary | ICD-10-CM | POA: Insufficient documentation

## 2014-06-19 MED ORDER — REGADENOSON 0.4 MG/5ML IV SOLN
0.4000 mg | Freq: Once | INTRAVENOUS | Status: AC
  Administered 2014-06-19: 0.4 mg via INTRAVENOUS

## 2014-06-19 MED ORDER — TECHNETIUM TC 99M SESTAMIBI GENERIC - CARDIOLITE
33.0000 | Freq: Once | INTRAVENOUS | Status: AC | PRN
  Administered 2014-06-19: 33 via INTRAVENOUS

## 2014-06-19 NOTE — Progress Notes (Signed)
[facility] SITE 3 NUCLEAR MED [address] [place], [place] [address] [phone]    Cardiology Nuclear Med Study  Tracey Walton is a 74 y.o. female     MRN : [id number]     DOB: 02/27/1941  Procedure Date: 06/19/2014  Nuclear Med Background Indication for Stress Test:  Evaluation for Ischemia and Abnormal EKG History:  no prior cardiac hx or testing Cardiac Risk Factors: Family History - CAD and Hypertension  Symptoms:  Chest Pressure with Exertion, DOE and SOB   Nuclear Pre-Procedure Caffeine/Decaff Intake:  None NPO After: 8:30pm   Lungs:  clear O2 Sat: 93% on room air. IV 0.9% NS with Angio Cath:  22g  IV Site: R Hand  IV Started by:  Doyne Keelonya Yount, CNMT  Chest Size (in):  42 Cup Size: DD  Height: 5' 4.5" (1.638 m)  Weight:  228 lb (103.42 kg)  BMI:  Body mass index is 38.55 kg/(m^2). Tech Comments:  n/a    Nuclear Med Study 1 or 2 day study: 2 day  Stress Test Type:  Lexiscan  Reading MD: n/a  Order Authorizing Provider:  Katherina RightP. Nahser, MD  Resting Radionuclide: Technetium 856m Sestamibi  Resting Radionuclide Dose: 33.0 mCi on 06/24/14   Stress Radionuclide:  Technetium 726m Sestamibi  Stress Radionuclide Dose: 33.0 mCi on 06/19/14           Stress Protocol Rest HR: 62 Stress HR: 86  Rest BP: 132/77 Stress BP: 128/57  Exercise Time (min): n/a METS: n/a   Predicted Max HR: 147 bpm % Max HR: 58.5 bpm Rate Pressure Product: [id number]   Dose of Adenosine (mg):  n/a Dose of Lexiscan: 0.4 mg  Dose of Atropine (mg): n/a Dose of Dobutamine: n/a mcg/kg/min (at max HR)  Stress Test Technologist: Nelson ChimesSharon Brooks, BS-ES  Nuclear Technologist:  Kerby NoraElzbieta Kubak, CNMT     Rest Procedure:  Myocardial perfusion imaging was performed at rest 45 minutes following the intravenous administration of Technetium 286m Sestamibi. Rest ECG: NSR-LVH  Stress Procedure:  The patient received IV Lexiscan 0.4 mg over 15-seconds.  Technetium 46m Sestamibi injected at 30-seconds.   Quantitative spect images were obtained after a 45 minute delay.  During the infusion of Lexiscan the patient complained of SOB that resolved in recovery.  Stress ECG: No significant change from baseline ECG  QPS Raw Data Images:  Breast attenuation artifact noted. Stress Images:  Overall homogeneous radiotracer uptake at stress and rest. Bowel loop attenuation as well as breast attenuation noted. Rest Images:  As above Subtraction (SDS):  No evidence of ischemia. Transient Ischemic Dilatation (Normal <1.22):  0.89 Lung/Heart Ratio (Normal <0.45):  0.20  Quantitative Gated Spect Images QGS EDV:  110 ml QGS ESV:  50 ml  Impression Exercise Capacity:  Lexiscan with no exercise. BP Response:  Normal blood pressure response. Clinical Symptoms:  No significant symptoms noted. ECG Impression:  No significant ST segment change suggestive of ischemia. Comparison with Prior Nuclear Study: No images to compare  Overall Impression:  Normal stress nuclear study. Overall low risk study with no areas of significant ischemia identified. Sensitivity and specificity of study are reduced by breast attenuation as well as bowel loop attenuation artifact.  LV Ejection Fraction: 54%.  LV Wall Motion:  There is hypokinesis of the base to mid inferior wall. This may be artifactual however given attenuation.  Donato SchultzSKAINS, Princetta Uplinger, MD

## 2014-06-19 NOTE — Progress Notes (Signed)
Echocardiogram performed.  

## 2014-06-24 ENCOUNTER — Encounter: Payer: Self-pay | Admitting: Cardiovascular Disease

## 2014-06-24 ENCOUNTER — Ambulatory Visit (INDEPENDENT_AMBULATORY_CARE_PROVIDER_SITE_OTHER): Payer: Medicare Other | Admitting: Cardiovascular Disease

## 2014-06-24 ENCOUNTER — Ambulatory Visit (HOSPITAL_COMMUNITY): Payer: Medicare Other | Attending: Cardiology

## 2014-06-24 VITALS — BP 136/90 | HR 84 | Ht 64.5 in | Wt 228.8 lb

## 2014-06-24 DIAGNOSIS — I159 Secondary hypertension, unspecified: Secondary | ICD-10-CM | POA: Diagnosis not present

## 2014-06-24 DIAGNOSIS — R0989 Other specified symptoms and signs involving the circulatory and respiratory systems: Secondary | ICD-10-CM

## 2014-06-24 LAB — BASIC METABOLIC PANEL
BUN: 36 mg/dL — ABNORMAL HIGH (ref 6–23)
CO2: 28 mEq/L (ref 19–32)
Calcium: 9.6 mg/dL (ref 8.4–10.5)
Chloride: 98 mEq/L (ref 96–112)
Creatinine, Ser: 1.17 mg/dL (ref 0.40–1.20)
GFR: 48.16 mL/min — ABNORMAL LOW (ref >60.00)
Glucose, Bld: 83 mg/dL (ref 70–99)
Potassium: 4.1 mEq/L (ref 3.5–5.1)
Sodium: 134 mEq/L — ABNORMAL LOW (ref 135–145)

## 2014-06-24 MED ORDER — ATORVASTATIN CALCIUM 40 MG PO TABS: 20.0000 mg | ORAL_TABLET | Freq: Every day | ORAL | Status: DC

## 2014-06-24 MED ORDER — TECHNETIUM TC 99M SESTAMIBI GENERIC - CARDIOLITE
33.0000 | Freq: Once | INTRAVENOUS | Status: AC | PRN
  Administered 2014-06-24: 33 via INTRAVENOUS

## 2014-06-24 NOTE — Progress Notes (Signed)
Cardiology Office Note   Date:  06/24/2014   ID:  [name], DOB 1940-12-04, MRN [id number]  PCP:  No PCP Per Patient  Cardiologist:   Thayer Headings, MD   Chief Complaint  Patient presents with  . Hypertension   Problem List 1. Essential HTN  2. Chest pressure 3. Arthritis 4. Heart murmur 5. Hyperlipidemia 6. Hiatal hernia    History of Present Illness: Tracey Walton is a 74 y.o. female who presents for further evaluation of her HTN Has had HTN for many years.  Her medical doctors at the no longer [facility] have had difficulty getting her blood pressure controlled. She has occasional episodes of chest pressure. These chest pain  Tend occur with exertion and are relieved with rest. They last for several minutes. There is no radiation.  She has chronic shortness of breath but the chest pains are not necessarily associated with worsening shortness breath.  She does not get any regular exercise. She tries to avoid eating excessive salt but admits that she still eats occasional salty foods.  She's had hyperlipidemia for the past 7 or 8 years. She is currently on Pravachol.  June 24, 2014:   [name] presents follow up of her HTN, and CP.  She was scheduled for an echo and myoview  Echo showed: normal LV systolic function. She has mild aortic stenosis, trivial aortic insufficiency, and mild mitral regurgitation.  Left ventricle: The cavity size was normal. Systolic function was normal. The estimated ejection fraction was in the range of 55% to 60%. Wall motion was normal; there were no regional wall motion abnormalities. Doppler parameters are consistent with abnormal left ventricular relaxation (grade 1 diastolic dysfunction). - Aortic valve: Cusp separation was reduced. There was very mild stenosis. There was trivial regurgitation. Mean gradient (S): 11 mm Hg. - Mitral valve: There was mild regurgitation  She had a [employer] today.   Initial images look good. No evidence of ischemia  Has been limiting the salt in her diet.    Past Medical History  Diagnosis Date  . Strep throat   . Chest pain 2011    negative for MI  . Hypertension   . Hyperlipidemia   . Chronic kidney disease     renal insufficiency  . Arthritis     Past Surgical History  Procedure Laterality Date  . Partial hysterectomy    . Carpal tunnel repair      left 2005 right 2003 surgery  . Right shoulder arthroscopy  2003     Current Outpatient Prescriptions  Medication Sig Dispense Refill  . amLODipine (NORVASC) 2.5 MG tablet Take 2.5 mg by mouth daily.    . calcium-vitamin D (OSCAL WITH D) 500-200 MG-UNIT per tablet Take 1 tablet by mouth daily.    . carvedilol (COREG) 6.25 MG tablet Take 1 tablet (6.25 mg total) by mouth 2 (two) times daily. 62 tablet 11  . fish oil-omega-3 fatty acids 1000 MG capsule Take 1,200 mg by mouth daily.    Marland Kitchen FLUoxetine (PROZAC) 20 MG capsule Take 20 mg by mouth daily.    Marland Kitchen lisinopril-hydrochlorothiazide (PRINZIDE,ZESTORETIC) 20-25 MG per tablet Take 1 tablet by mouth daily.    . Magnesium 250 MG TABS Take 250 mg by mouth daily.    . naproxen sodium (ANAPROX) 220 MG tablet Take 220 mg by mouth daily as needed. AS NEEDED FOR MILD PAIN    . omeprazole (PRILOSEC) 20 MG capsule Take 20 mg by mouth daily.    Marland Kitchen  pravastatin (PRAVACHOL) 40 MG tablet Take 40 mg by mouth daily.    . traMADol (ULTRAM) 50 MG tablet Take 50 mg by mouth every 8 (eight) hours as needed. AS NEEDED FOR MILD PAIN     No current facility-administered medications for this visit.    Allergies:   Penicillins    Social History:  The patient  reports that she has never smoked. She has never used smokeless tobacco. She reports that she does not drink alcohol or use illicit drugs.   Family History:  The patient's family history includes Heart attack in her mother; Heart disease in her mother; Heart failure in her father; Hyperlipidemia in her father  and sister; Hypertension in her father and sister; Kidney failure in her father.    ROS:  Please see the history of present illness.    Review of Systems: Constitutional:  denies fever, chills, diaphoresis, appetite change and fatigue.  HEENT: denies photophobia, eye pain, redness, hearing loss, ear pain, congestion, sore throat, rhinorrhea, sneezing, neck pain, neck stiffness and tinnitus.  Respiratory: admits to SOB, DOE,   Cardiovascular: admits to chest pain, palpitations . Has some leg swelling.  Gastrointestinal: denies nausea, vomiting, abdominal pain, diarrhea, constipation, blood in stool.  Genitourinary: denies dysuria, urgency, frequency, hematuria, flank pain and difficulty urinating.  Musculoskeletal: admits to  back pain, joint swelling, arthralgias    Skin: denies pallor, rash and wound.  Neurological: denies dizziness, seizures, syncope, weakness, light-headedness, numbness and headaches.   Hematological: denies adenopathy, easy bruising, personal or family bleeding history.  Psychiatric/ Behavioral: denies suicidal ideation, mood changes, confusion, nervousness, sleep disturbance and agitation.       All other systems are reviewed and negative.    PHYSICAL EXAM: VS:  There were no vitals taken for this visit. , BMI There is no weight on file to calculate BMI. GEN: Well nourished, well developed, in no acute distress HEENT: normal Neck: no JVD, carotid bruits, or masses Cardiac: RRR; no murmurs, rubs, or gallops,no edema  Respiratory:  clear to auscultation bilaterally, normal work of breathing GI: soft, nontender, nondistended, + BS MS: no deformity or atrophy Skin: warm and dry, no rash Neuro:  Strength and sensation are intact Psych: normal   EKG:  EKG is ordered today. The ekg ordered today demonstrates normal sinus rhythm at 79. She has poor R-wave progression that is probably due to lead placement. She has tiny inferior, nonsignificant Q  waves.   Recent Labs: 05/29/2014: ALT 15; BUN 32*; Creatinine 1.20; Magnesium 2.0; Potassium 4.1; Sodium 137; TSH 0.86    Lipid Panel    Component Value Date/Time   CHOL 238* 05/29/2014 1104   TRIG 131.0 05/29/2014 1104   HDL 45.50 05/29/2014 1104   CHOLHDL 5 05/29/2014 1104   VLDL 26.2 05/29/2014 1104   LDLCALC 166* 05/29/2014 1104      Wt Readings from Last 3 Encounters:  06/19/14 228 lb (103.42 kg)  05/29/14 228 lb (103.42 kg)      Other studies Reviewed: Additional studies/ records that were reviewed today include: . Review of the above records demonstrates:    ASSESSMENT AND PLAN:  1. Essential HTN  - she is improved her diet since we last met.   We've started her on carvedilol and she seems to be tolerating the carvedilol quite well. She does have occasional episodes of some lightheadedness just before lunchtime. I've encouraged her to work on diet, exercise, and weight loss program. Will check BMP today.  2. Chest pressure-  her stress Myoview study is unremarkable. There is no evidence of ischemia.  3. Arthritis 4. Heart murmur -she was found have mild aortic stenosis, trace aortic insufficiency, mild mitral regurgitation. We'll continue to follow this. None of these are clinically significant at this time.  5. Hyperlipidemia - she has a history of hyperlipidemia. She's currently on Pravachol. Her LDL is still 166. Will DC the Pravachol start her on atorvastatin 40 mg a day.  6. Hiatal hernia   Current medicines are reviewed at length with the patient today.  The patient does not have concerns regarding medicines.  The following changes have been made:  no change   Disposition:   FU with me 1 month    Signed, Marieann Zipp, Wonda Cheng, MD  06/24/2014 8:12 AM    [facility] [place], [place], [place]  [address] Phone: [phone]; Fax: [phone]

## 2014-06-24 NOTE — Patient Instructions (Addendum)
Your physician has recommended you make the following change in your medication:  1) STOP Pravachol 2) START Atorvastatin 40 mg daily  Your physician recommends that you return for lab work: TODAY (bmet)  Your physician recommends that you return for lab work in: 3 months (Fasting Lipids/LFT/BMET)   Your physician recommends that you schedule a follow-up appointment in: 3 months with Dr. Carolan ShiverNahser  BRING MEDICATIONS WITH YOU TO THIS VISIT

## 2014-06-29 ENCOUNTER — Other Ambulatory Visit: Payer: Self-pay | Admitting: Nurse Practitioner

## 2014-06-29 ENCOUNTER — Telehealth: Payer: Self-pay | Admitting: Cardiovascular Disease

## 2014-06-29 NOTE — Telephone Encounter (Signed)
Returned patient's call to review stress test and lab results with patient.  Patient verbalized understanding of results and asked if she could reduce one of her blood pressure medicines.  She advised that at office visit last week, Dr. Elease HashimotoNahser was thinking of changing one of them and wanted her to call back to report her dosages.  Patient reports she is not taking Lisinopril/HCTZ; I have removed this from her list.  Patient reports BP readings today 120/70 when she woke up and then 96/50 after medications; she reports average readings are approximately 110/65.  She complains of some light-headedness.  She reports that she takes Carvedilol and HCTZ when she wakes up and Carvedilol and Norvasc before bedtime.  I advised patient that I will discuss with Dr. Elease HashimotoNahser and call her back today or tomorrow with his advice and to try taking Norvasc in the middle of the day today to try to alleviate light-headedness in the mornings.  Patient verbalized understanding and agreement.

## 2014-06-29 NOTE — Telephone Encounter (Signed)
Follow UP  Pt returning phone call from Friday 3/25. Please call back and discuss.

## 2014-06-30 NOTE — Telephone Encounter (Signed)
Spoke with patient and reviewed Dr. Harvie BridgeNahser's advice to decrease HCTZ to 12.5 mg once daily to help with her orthostasis.  I advised patient to call me next week to let me know how she is feeling.  Patient verbalized understanding and agreement.

## 2014-06-30 NOTE — Telephone Encounter (Signed)
Will decrease HCTZ to 1/2 current dose to see if that helps her orthostasis

## 2014-07-06 ENCOUNTER — Other Ambulatory Visit: Payer: Self-pay | Admitting: Cardiovascular Disease

## 2014-07-06 DIAGNOSIS — I350 Nonrheumatic aortic (valve) stenosis: Secondary | ICD-10-CM | POA: Insufficient documentation

## 2014-07-07 ENCOUNTER — Other Ambulatory Visit: Payer: Self-pay

## 2014-07-07 MED ORDER — HYDROCHLOROTHIAZIDE 25 MG PO TABS: 12.5000 mg | ORAL_TABLET | Freq: Every day | ORAL | Status: DC

## 2014-09-04 ENCOUNTER — Other Ambulatory Visit (INDEPENDENT_AMBULATORY_CARE_PROVIDER_SITE_OTHER): Payer: Medicare Other | Admitting: *Deleted

## 2014-09-04 DIAGNOSIS — I159 Secondary hypertension, unspecified: Secondary | ICD-10-CM | POA: Diagnosis not present

## 2014-09-04 LAB — HEPATIC FUNCTION PANEL
ALT: 15 U/L (ref 0–35)
AST: 17 U/L (ref 0–37)
Albumin: 3.8 g/dL (ref 3.5–5.2)
Alkaline Phosphatase: 77 U/L (ref 39–117)
Bilirubin, Direct: 0.1 mg/dL (ref 0.0–0.3)
Total Bilirubin: 0.4 mg/dL (ref 0.2–1.2)
Total Protein: 7 g/dL (ref 6.0–8.3)

## 2014-09-04 LAB — LIPID PANEL
Cholesterol: 202 mg/dL — ABNORMAL HIGH (ref 0–200)
HDL: 41 mg/dL (ref >39.00)
LDL Cholesterol: 140 mg/dL — ABNORMAL HIGH (ref 0–99)
NonHDL: 161
Total CHOL/HDL Ratio: 5
Triglycerides: 104 mg/dL (ref 0.0–149.0)
VLDL: 20.8 mg/dL (ref 0.0–40.0)

## 2014-09-04 LAB — BASIC METABOLIC PANEL
BUN: 34 mg/dL — ABNORMAL HIGH (ref 6–23)
CO2: 28 mEq/L (ref 19–32)
Calcium: 9.1 mg/dL (ref 8.4–10.5)
Chloride: 102 mEq/L (ref 96–112)
Creatinine, Ser: 1.24 mg/dL — ABNORMAL HIGH (ref 0.40–1.20)
GFR: 45.01 mL/min — ABNORMAL LOW (ref >60.00)
Glucose, Bld: 90 mg/dL (ref 70–99)
Potassium: 4.4 mEq/L (ref 3.5–5.1)
Sodium: 136 mEq/L (ref 135–145)

## 2014-09-08 ENCOUNTER — Encounter: Payer: Self-pay | Admitting: *Deleted

## 2014-09-08 NOTE — Progress Notes (Signed)
Cardiology Office Note   Date:  09/08/2014   ID:  Tracey Walton, DOB 1941-03-17, MRN [id number]  PCP:  No PCP Per Patient  Cardiologist:   Thayer Headings, MD   Chief Complaint  Patient presents with  . Hypertension   Problem List 1. Essential HTN  2. Chest pressure 3. Arthritis 4. Heart murmur 5. Hyperlipidemia 6. Hiatal hernia    History of Present Illness: Tracey Walton is a 74 y.o. female who presents for further evaluation of her HTN Has had HTN for many years.  Her medical doctors at the no longer [facility] have had difficulty getting her blood pressure controlled. She has occasional episodes of chest pressure. These chest pain  Tend occur with exertion and are relieved with rest. They last for several minutes. There is no radiation.  She has chronic shortness of breath but the chest pains are not necessarily associated with worsening shortness breath.  She does not get any regular exercise. She tries to avoid eating excessive salt but admits that she still eats occasional salty foods.  She's had hyperlipidemia for the past 7 or 8 years. She is currently on Pravachol.  June 24, 2014:   Tracey Walton presents follow up of her HTN, and CP.  She was scheduled for an echo and myoview  Echo showed: normal LV systolic function. She has mild aortic stenosis, trivial aortic insufficiency, and mild mitral regurgitation.  Left ventricle: The cavity size was normal. Systolic function was normal. The estimated ejection fraction was in the range of 55% to 60%. Wall motion was normal; there were no regional wall motion abnormalities. Doppler parameters are consistent with abnormal left ventricular relaxation (grade 1 diastolic dysfunction). - Aortic valve: Cusp separation was reduced. There was very mild stenosis. There was trivial regurgitation. Mean gradient (S): 11 mm Hg. - Mitral valve: There was mild regurgitation  She had a [employer] today.   Initial images look good. No evidence of ischemia  Has been limiting the salt in her diet.    June 8.  2016:  Tracey Walton is doing well. BP is well controlled at home.   Past Medical History  Diagnosis Date  . Strep throat   . Chest pain 2011    negative for MI  . Hypertension   . Hyperlipidemia   . Chronic kidney disease     renal insufficiency  . Arthritis     Past Surgical History  Procedure Laterality Date  . Partial hysterectomy    . Carpal tunnel repair      left 2005 right 2003 surgery  . Right shoulder arthroscopy  2003     Current Outpatient Prescriptions  Medication Sig Dispense Refill  . amLODipine (NORVASC) 2.5 MG tablet TAKE 1 TABLET BY MOUTH DAILY IN THE EVENING 30 tablet 3  . atorvastatin (LIPITOR) 40 MG tablet Take 0.5 tablets (20 mg total) by mouth daily. 3 tablet 6  . calcium-vitamin D (OSCAL WITH D) 500-200 MG-UNIT per tablet Take 1 tablet by mouth daily.    . carvedilol (COREG) 6.25 MG tablet Take 1 tablet (6.25 mg total) by mouth 2 (two) times daily. 62 tablet 11  . fish oil-omega-3 fatty acids 1000 MG capsule Take 1,200 mg by mouth daily.    Marland Kitchen FLUoxetine (PROZAC) 20 MG capsule Take 20 mg by mouth daily.    . hydrochlorothiazide (HYDRODIURIL) 25 MG tablet Take 0.5 tablets (12.5 mg total) by mouth daily. 30 tablet 3  . Magnesium 250 MG TABS Take  250 mg by mouth daily.    . naproxen sodium (ANAPROX) 220 MG tablet Take 220 mg by mouth daily as needed. AS NEEDED FOR MILD PAIN    . omeprazole (PRILOSEC) 20 MG capsule Take 20 mg by mouth daily.    . traMADol (ULTRAM) 50 MG tablet Take 50 mg by mouth every 8 (eight) hours as needed. AS NEEDED FOR MILD PAIN     No current facility-administered medications for this visit.    Allergies:   Penicillins    Social History:  The patient  reports that she has never smoked. She has never used smokeless tobacco. She reports that she does not drink alcohol or use illicit drugs.   Family History:  The patient's  family history includes Heart attack in her mother; Heart disease in her mother; Heart failure in her father; Hyperlipidemia in her father and sister; Hypertension in her father and sister; Kidney failure in her father.    ROS:  Please see the history of present illness.    Review of Systems: Constitutional:  denies fever, chills, diaphoresis, appetite change and fatigue.  HEENT: denies photophobia, eye pain, redness, hearing loss, ear pain, congestion, sore throat, rhinorrhea, sneezing, neck pain, neck stiffness and tinnitus.  Respiratory: admits to SOB, DOE,   Cardiovascular: admits to chest pain, palpitations . Has some leg swelling.  Gastrointestinal: denies nausea, vomiting, abdominal pain, diarrhea, constipation, blood in stool.  Genitourinary: denies dysuria, urgency, frequency, hematuria, flank pain and difficulty urinating.  Musculoskeletal: admits to  back pain, joint swelling, arthralgias    Skin: denies pallor, rash and wound.  Neurological: denies dizziness, seizures, syncope, weakness, light-headedness, numbness and headaches.   Hematological: denies adenopathy, easy bruising, personal or family bleeding history.  Psychiatric/ Behavioral: denies suicidal ideation, mood changes, confusion, nervousness, sleep disturbance and agitation.       All other systems are reviewed and negative.    PHYSICAL EXAM: VS:  There were no vitals taken for this visit. , BMI There is no weight on file to calculate BMI. GEN: Well nourished, well developed, in no acute distress HEENT: normal Neck: no JVD, carotid bruits, or masses Cardiac: RRR; no murmurs, rubs, or gallops,no edema  Respiratory:  clear to auscultation bilaterally, normal work of breathing GI: soft, nontender, nondistended, + BS MS: no deformity or atrophy Skin: warm and dry, no rash Neuro:  Strength and sensation are intact Psych: normal   EKG:  EKG is ordered today. The ekg ordered today demonstrates normal sinus  rhythm at 79. She has poor R-wave progression that is probably due to lead placement. She has tiny inferior, nonsignificant Q waves.   Recent Labs: 05/29/2014: Magnesium 2.0; TSH 0.86 09/04/2014: ALT 15; BUN 34*; Creatinine 1.24*; Potassium 4.4; Sodium 136    Lipid Panel    Component Value Date/Time   CHOL 202* 09/04/2014 0742   TRIG 104.0 09/04/2014 0742   HDL 41.00 09/04/2014 0742   CHOLHDL 5 09/04/2014 0742   VLDL 20.8 09/04/2014 0742   LDLCALC 140* 09/04/2014 0742      Wt Readings from Last 3 Encounters:  06/24/14 103.783 kg (228 lb 12.8 oz)  06/19/14 103.42 kg (228 lb)  05/29/14 103.42 kg (228 lb)      Other studies Reviewed: Additional studies/ records that were reviewed today include: . Review of the above records demonstrates:    ASSESSMENT AND PLAN:  1. Essential HTN  - she is improved her diet since we last met.  BP is much better at home.  Continue current meds.   2. Chest pressure- her stress Myoview study is unremarkable. There is no evidence of ischemia.  3. Heart murmur -she was found have mild aortic stenosis, trace aortic insufficiency, mild mitral regurgitation. We'll continue to follow this. None of these are clinically significant at this time.  4. Hyperlipidemia - she has a history of hyperlipidemia. She's was on  Pravachol.    We started atorvastatin 40 a day but she took 1/2 tablet instead.  Her LDL is still 140. Will increase Atorvastatin to 40 a day and recheck labs in 3 months .    5. Essential Hypertension :    We added coreg 6.25 BID at her last visit.  Will continue to encourage diet and exercise and weight loss.   Current medicines are reviewed at length with the patient today.  The patient does not have concerns regarding medicines.  The following changes have been made:  no change   Disposition:   FU with me in 6 months    Signed, Nahser, Wonda Cheng, MD  09/08/2014 6:00 AM    [facility] [place],  [place], [place]  [address] Phone: [phone]; Fax: [phone]

## 2014-09-09 ENCOUNTER — Encounter: Payer: Self-pay | Admitting: Cardiovascular Disease

## 2014-09-09 ENCOUNTER — Ambulatory Visit (INDEPENDENT_AMBULATORY_CARE_PROVIDER_SITE_OTHER): Payer: Medicare Other | Admitting: Cardiovascular Disease

## 2014-09-09 VITALS — BP 156/90 | HR 81 | Ht 64.5 in | Wt 225.1 lb

## 2014-09-09 DIAGNOSIS — I10 Essential (primary) hypertension: Secondary | ICD-10-CM

## 2014-09-09 DIAGNOSIS — E785 Hyperlipidemia, unspecified: Secondary | ICD-10-CM | POA: Diagnosis not present

## 2014-09-09 NOTE — Patient Instructions (Signed)
Medication Instructions:  INCREASE Atorvastatin to 40 mg once daily - call Eligha BridegroomMichelle Corleen Otwell, RN on Tuesday 6/14 to give pharmacy name  Labwork: Your physician recommends that you return for lab work in: 3 months with Dr. Elease HashimotoNahser   Testing/Procedures: None Ordered   Follow-Up: Your physician wants you to follow-up in: 6 months with Dr. Elease HashimotoNahser.  You will receive a reminder letter in the mail two months in advance. If you don't receive a letter, please call our office to schedule the follow-up appointment.

## 2014-09-16 ENCOUNTER — Telehealth: Payer: Self-pay | Admitting: Cardiovascular Disease

## 2014-09-16 MED ORDER — ATORVASTATIN CALCIUM 40 MG PO TABS: 40.0000 mg | ORAL_TABLET | Freq: Every day | ORAL | Status: DC

## 2014-09-16 NOTE — Telephone Encounter (Signed)
Patient was advised at office visit on 6/8 to call back to give me her mail order pharmacy.  I have sent Atorvastatin 40 mg #90 with 3 refills to [facility] per patient request.

## 2014-09-16 NOTE — Telephone Encounter (Signed)
New message      Calling to give Tracey Walton her mail order pharmacy name.  It is [facility]

## 2014-09-18 ENCOUNTER — Other Ambulatory Visit: Payer: Self-pay | Admitting: *Deleted

## 2014-09-18 MED ORDER — OMEPRAZOLE 20 MG PO CPDR: 20.0000 mg | DELAYED_RELEASE_CAPSULE | Freq: Every day | ORAL | Status: DC

## 2014-09-18 MED ORDER — AMLODIPINE BESYLATE 2.5 MG PO TABS: 2.5000 mg | ORAL_TABLET | Freq: Every evening | ORAL | Status: DC

## 2014-09-18 MED ORDER — CARVEDILOL 6.25 MG PO TABS: 6.2500 mg | ORAL_TABLET | Freq: Two times a day (BID) | ORAL | Status: DC

## 2014-09-18 MED ORDER — HYDROCHLOROTHIAZIDE 25 MG PO TABS: 12.5000 mg | ORAL_TABLET | Freq: Every day | ORAL | Status: DC

## 2014-09-22 ENCOUNTER — Other Ambulatory Visit: Payer: Self-pay

## 2014-09-22 NOTE — Telephone Encounter (Signed)
Per Dr. Elease Hashimoto, he does not prescribe anti-depressants

## 2014-09-22 NOTE — Telephone Encounter (Signed)
Yes, please send to her PCP

## 2014-12-18 ENCOUNTER — Other Ambulatory Visit (INDEPENDENT_AMBULATORY_CARE_PROVIDER_SITE_OTHER): Payer: Medicare Other | Admitting: *Deleted

## 2014-12-18 DIAGNOSIS — E785 Hyperlipidemia, unspecified: Secondary | ICD-10-CM

## 2014-12-18 LAB — BASIC METABOLIC PANEL
BUN: 25 mg/dL — ABNORMAL HIGH (ref 6–23)
CO2: 28 mEq/L (ref 19–32)
Calcium: 9.4 mg/dL (ref 8.4–10.5)
Chloride: 102 mEq/L (ref 96–112)
Creatinine, Ser: 1.36 mg/dL — ABNORMAL HIGH (ref 0.40–1.20)
GFR: 40.43 mL/min — ABNORMAL LOW (ref >60.00)
Glucose, Bld: 83 mg/dL (ref 70–99)
Potassium: 4.1 mEq/L (ref 3.5–5.1)
Sodium: 138 mEq/L (ref 135–145)

## 2014-12-18 LAB — LIPID PANEL
Cholesterol: 178 mg/dL (ref 0–200)
HDL: 46.6 mg/dL (ref >39.00)
LDL Cholesterol: 118 mg/dL — ABNORMAL HIGH (ref 0–99)
NonHDL: 131.62
Total CHOL/HDL Ratio: 4
Triglycerides: 70 mg/dL (ref 0.0–149.0)
VLDL: 14 mg/dL (ref 0.0–40.0)

## 2014-12-18 LAB — HEPATIC FUNCTION PANEL
ALT: 18 U/L (ref 0–35)
AST: 26 U/L (ref 0–37)
Albumin: 3.8 g/dL (ref 3.5–5.2)
Alkaline Phosphatase: 79 U/L (ref 39–117)
Bilirubin, Direct: 0.1 mg/dL (ref 0.0–0.3)
Total Bilirubin: 0.5 mg/dL (ref 0.2–1.2)
Total Protein: 7.3 g/dL (ref 6.0–8.3)

## 2014-12-28 ENCOUNTER — Other Ambulatory Visit: Payer: Self-pay | Admitting: Cardiovascular Disease

## 2015-02-19 ENCOUNTER — Other Ambulatory Visit: Payer: Self-pay | Admitting: Cardiovascular Disease

## 2015-03-12 ENCOUNTER — Ambulatory Visit (INDEPENDENT_AMBULATORY_CARE_PROVIDER_SITE_OTHER): Payer: Medicare Other | Admitting: Cardiovascular Disease

## 2015-03-12 ENCOUNTER — Encounter: Payer: Self-pay | Admitting: Cardiovascular Disease

## 2015-03-12 VITALS — BP 170/92 | HR 71 | Ht 64.5 in | Wt 228.4 lb

## 2015-03-12 DIAGNOSIS — E785 Hyperlipidemia, unspecified: Secondary | ICD-10-CM | POA: Diagnosis not present

## 2015-03-12 DIAGNOSIS — I10 Essential (primary) hypertension: Secondary | ICD-10-CM

## 2015-03-12 NOTE — Patient Instructions (Signed)

## 2015-03-12 NOTE — Progress Notes (Signed)
Cardiology Office Note   Date:  03/12/2015   ID:  Tracey Walton, DOB Oct 07, 1940, MRN [id number]  PCP:  No PCP Per Patient  Cardiologist:   Thayer Headings, MD   Chief Complaint  Patient presents with  . Follow-up    hyperlipidemia   Problem List 1. Essential HTN  2. Chest pressure 3. Arthritis 4. Heart murmur 5. Hyperlipidemia 6. Hiatal hernia    History of Present Illness: Tracey Walton is a 74 y.o. female who presents for further evaluation of her HTN Has had HTN for many years.  Her medical doctors at the no longer [facility] have had difficulty getting her blood pressure controlled. She has occasional episodes of chest pressure. These chest pain  Tend occur with exertion and are relieved with rest. They last for several minutes. There is no radiation.  She has chronic shortness of breath but the chest pains are not necessarily associated with worsening shortness breath.  She does not get any regular exercise. She tries to avoid eating excessive salt but admits that she still eats occasional salty foods.  She's had hyperlipidemia for the past 7 or 8 years. She is currently on Pravachol.  June 24, 2014:   Tracey Walton presents follow up of her HTN, and CP.  She was scheduled for an echo and myoview  Echo showed: normal LV systolic function. She has mild aortic stenosis, trivial aortic insufficiency, and mild mitral regurgitation.  Left ventricle: The cavity size was normal. Systolic function was normal. The estimated ejection fraction was in the range of 55% to 60%. Wall motion was normal; there were no regional wall motion abnormalities. Doppler parameters are consistent with abnormal left ventricular relaxation (grade 1 diastolic dysfunction). - Aortic valve: Cusp separation was reduced. There was very mild stenosis. There was trivial regurgitation. Mean gradient (S): 11 mm Hg. - Mitral valve: There was mild regurgitation  She had a [employer] today.  Initial images look good. No evidence of ischemia  Has been limiting the salt in her diet.    June 8.  2016:  Tracey Walton is doing well. BP is well controlled at home.   Dec. 9, 2016  Doing well.  BP is normal at home.  Is always elevated at the office.  No CP,  Has some DOE with walking .  Does not walk regularly .   Past Medical History  Diagnosis Date  . Strep throat   . Chest pain 2011    negative for MI  . Hypertension   . Hyperlipidemia   . Chronic kidney disease     renal insufficiency  . Arthritis     Past Surgical History  Procedure Laterality Date  . Partial hysterectomy    . Carpal tunnel repair      left 2005 right 2003 surgery  . Right shoulder arthroscopy  2003     Current Outpatient Prescriptions  Medication Sig Dispense Refill  . amLODipine (NORVASC) 2.5 MG tablet Take 1 tablet by mouth  every evening 90 tablet 3  . atorvastatin (LIPITOR) 40 MG tablet Take 1 tablet (40 mg total) by mouth daily at 6 PM. 90 tablet 3  . calcium-vitamin D (OSCAL WITH D) 500-200 MG-UNIT per tablet Take 1 tablet by mouth daily.    . carvedilol (COREG) 6.25 MG tablet Take 1 tablet (6.25 mg total) by mouth 2 (two) times daily. 62 tablet 11  . fish oil-omega-3 fatty acids 1000 MG capsule Take 1,200 mg by mouth daily.    Marland Kitchen  FLUoxetine (PROZAC) 10 MG capsule Take 10 mg by mouth daily.    Marland Kitchen FLUoxetine (PROZAC) 20 MG capsule Take 20 mg by mouth daily.    . hydrochlorothiazide (HYDRODIURIL) 25 MG tablet Take 0.5 tablets (12.5 mg total) by mouth daily. 30 tablet 5  . Magnesium 250 MG TABS Take 250 mg by mouth daily.    . naproxen sodium (ANAPROX) 220 MG tablet Take 220 mg by mouth daily as needed. AS NEEDED FOR MILD PAIN    . omeprazole (PRILOSEC) 20 MG capsule Take 1 capsule by mouth  daily 90 capsule 3  . traMADol (ULTRAM) 50 MG tablet Take 50 mg by mouth every 8 (eight) hours as needed. AS NEEDED FOR MILD PAIN     No current facility-administered medications for  this visit.    Allergies:   Penicillins    Social History:  The patient  reports that she has never smoked. She has never used smokeless tobacco. She reports that she does not drink alcohol or use illicit drugs.   Family History:  The patient's family history includes Heart attack in her mother; Heart disease in her mother; Heart failure in her father; Hyperlipidemia in her father and sister; Hypertension in her father and sister; Kidney failure in her father.    ROS:  Please see the history of present illness.    Review of Systems: Constitutional:  denies fever, chills, diaphoresis, appetite change and fatigue.  HEENT: denies photophobia, eye pain, redness, hearing loss, ear pain, congestion, sore throat, rhinorrhea, sneezing, neck pain, neck stiffness and tinnitus.  Respiratory: admits to SOB, DOE,   Cardiovascular: admits to chest pain, palpitations . Has some leg swelling.  Gastrointestinal: denies nausea, vomiting, abdominal pain, diarrhea, constipation, blood in stool.  Genitourinary: denies dysuria, urgency, frequency, hematuria, flank pain and difficulty urinating.  Musculoskeletal: admits to  back pain, joint swelling, arthralgias    Skin: denies pallor, rash and wound.  Neurological: denies dizziness, seizures, syncope, weakness, light-headedness, numbness and headaches.   Hematological: denies adenopathy, easy bruising, personal or family bleeding history.  Psychiatric/ Behavioral: denies suicidal ideation, mood changes, confusion, nervousness, sleep disturbance and agitation.       All other systems are reviewed and negative.    PHYSICAL EXAM: VS:  BP 170/92 mmHg  Pulse 71  Ht 5' 4.5" (1.638 m)  Wt 228 lb 6.4 oz (103.602 kg)  BMI 38.61 kg/m2  SpO2 97% , BMI Body mass index is 38.61 kg/(m^2). GEN: Well nourished, well developed, in no acute distress HEENT: normal Neck: no JVD, carotid bruits, or masses Cardiac: RRR; no murmurs, rubs, or gallops,no edema    Respiratory:  clear to auscultation bilaterally, normal work of breathing GI: soft, nontender, nondistended, + BS MS: no deformity or atrophy Skin: warm and dry, no rash Neuro:  Strength and sensation are intact Psych: normal   EKG:  EKG is ordered today. The ekg ordered today demonstrates normal sinus rhythm at 79. She has poor R-wave progression that is probably due to lead placement. She has tiny inferior, nonsignificant Q waves.   Recent Labs: 05/29/2014: Magnesium 2.0; TSH 0.86 12/18/2014: ALT 18; BUN 25*; Creatinine, Ser 1.36*; Potassium 4.1; Sodium 138    Lipid Panel    Component Value Date/Time   CHOL 178 12/18/2014 1103   TRIG 70.0 12/18/2014 1103   HDL 46.60 12/18/2014 1103   CHOLHDL 4 12/18/2014 1103   VLDL 14.0 12/18/2014 1103   LDLCALC 118* 12/18/2014 1103      Wt Readings from  Last 3 Encounters:  03/12/15 228 lb 6.4 oz (103.602 kg)  09/09/14 225 lb 1.9 oz (102.114 kg)  06/24/14 228 lb 12.8 oz (103.783 kg)      Other studies Reviewed: Additional studies/ records that were reviewed today include: . Review of the above records demonstrates:    ASSESSMENT AND PLAN:  1. Essential HTN  - she is improved her diet since we last met.  BP is much better at home.  Continue current meds.   2. Chest pressure- her stress Myoview study is unremarkable. There is no evidence of ischemia.  3. Heart murmur -she was found have mild aortic stenosis, trace aortic insufficiency, mild mitral regurgitation. We'll continue to follow this. None of these are clinically significant at this time.  4. Hyperlipidemia - she has a history of hyperlipidemia.   She is ow on Atorvastatin 40 .   Needs to improve diet.   .    5. Essential Hypertension :    Continue current meds.  BP is normal at home.   Will continue to encourage diet and exercise and weight loss.   She admits that she is lazy at times. .   Current medicines are reviewed at length with the patient today.  The patient  does not have concerns regarding medicines.  The following changes have been made:  no change   Disposition:   FU with me in 6 months    Signed, Denard Tuminello, Wonda Cheng, MD  03/12/2015 12:10 PM    [facility] [place], [place], [place]  [address] Phone: [phone]; Fax: [phone]

## 2015-07-05 ENCOUNTER — Other Ambulatory Visit: Payer: Self-pay | Admitting: Cardiovascular Disease

## 2015-07-20 ENCOUNTER — Other Ambulatory Visit: Payer: Self-pay | Admitting: Cardiovascular Disease

## 2015-07-26 ENCOUNTER — Other Ambulatory Visit: Payer: Self-pay | Admitting: *Deleted

## 2015-07-26 MED ORDER — ATORVASTATIN CALCIUM 40 MG PO TABS: ORAL_TABLET | ORAL | Status: DC

## 2015-07-26 MED ORDER — CARVEDILOL 6.25 MG PO TABS: ORAL_TABLET | ORAL | Status: DC

## 2015-09-10 ENCOUNTER — Ambulatory Visit: Payer: Medicare Other | Admitting: Cardiovascular Disease

## 2015-09-10 ENCOUNTER — Other Ambulatory Visit: Payer: Medicare Other

## 2015-10-01 ENCOUNTER — Encounter: Payer: Self-pay | Admitting: Cardiovascular Disease

## 2015-10-01 ENCOUNTER — Ambulatory Visit (INDEPENDENT_AMBULATORY_CARE_PROVIDER_SITE_OTHER): Payer: Medicare Other | Admitting: Cardiovascular Disease

## 2015-10-01 ENCOUNTER — Other Ambulatory Visit (INDEPENDENT_AMBULATORY_CARE_PROVIDER_SITE_OTHER): Payer: Medicare Other | Admitting: *Deleted

## 2015-10-01 VITALS — BP 145/88 | HR 59 | Ht 64.5 in | Wt 225.4 lb

## 2015-10-01 DIAGNOSIS — E785 Hyperlipidemia, unspecified: Secondary | ICD-10-CM

## 2015-10-01 DIAGNOSIS — I10 Essential (primary) hypertension: Secondary | ICD-10-CM | POA: Diagnosis not present

## 2015-10-01 LAB — LIPID PANEL
Cholesterol: 200 mg/dL (ref 125–200)
HDL: 55 mg/dL (ref >=46)
LDL Cholesterol: 131 mg/dL — ABNORMAL HIGH (ref <130)
Total CHOL/HDL Ratio: 3.6 Ratio (ref <=5.0)
Triglycerides: 72 mg/dL (ref <150)
VLDL: 14 mg/dL (ref <30)

## 2015-10-01 LAB — COMPREHENSIVE METABOLIC PANEL
ALT: 14 U/L (ref 6–29)
AST: 17 U/L (ref 10–35)
Albumin: 3.8 g/dL (ref 3.6–5.1)
Alkaline Phosphatase: 73 U/L (ref 33–130)
BUN: 36 mg/dL — ABNORMAL HIGH (ref 7–25)
CO2: 27 mmol/L (ref 20–31)
Calcium: 9.2 mg/dL (ref 8.6–10.4)
Chloride: 101 mmol/L (ref 98–110)
Creat: 1.1 mg/dL — ABNORMAL HIGH (ref 0.60–0.93)
Glucose, Bld: 84 mg/dL (ref 65–99)
Potassium: 4.1 mmol/L (ref 3.5–5.3)
Sodium: 138 mmol/L (ref 135–146)
Total Bilirubin: 0.7 mg/dL (ref 0.2–1.2)
Total Protein: 6.8 g/dL (ref 6.1–8.1)

## 2015-10-01 MED ORDER — ATORVASTATIN CALCIUM 40 MG PO TABS: ORAL_TABLET | ORAL | Status: DC

## 2015-10-01 MED ORDER — CARVEDILOL 6.25 MG PO TABS: ORAL_TABLET | ORAL | Status: DC

## 2015-10-01 MED ORDER — OMEPRAZOLE 20 MG PO CPDR: 20.0000 mg | DELAYED_RELEASE_CAPSULE | Freq: Every day | ORAL | Status: DC

## 2015-10-01 MED ORDER — AMLODIPINE BESYLATE 2.5 MG PO TABS: ORAL_TABLET | ORAL | Status: DC

## 2015-10-01 MED ORDER — HYDROCHLOROTHIAZIDE 25 MG PO TABS: ORAL_TABLET | ORAL | Status: DC

## 2015-10-01 NOTE — Progress Notes (Signed)
Cardiology Office Note   Date:  10/01/2015   ID:  Tracey Walton, DOB 1940-09-08, MRN [id number]  PCP:  No PCP Per Patient  Cardiologist:   Kristeen MissPhilip Nahser, MD   Chief Complaint  Patient presents with  . Hyperlipidemia   Problem List 1. Essential HTN  2. Chest pressure 3. Arthritis 4. Heart murmur 5. Hyperlipidemia 6. Hiatal hernia    History of Present Illness: Tracey Walton is a 75 y.o. female who presents for further evaluation of her HTN Has had HTN for many years.  Her medical doctors at the no longer [facility] have had difficulty getting her blood pressure controlled. She has occasional episodes of chest pressure. These chest pain  Tend occur with exertion and are relieved with rest. They last for several minutes. There is no radiation.  She has chronic shortness of breath but the chest pains are not necessarily associated with worsening shortness breath.  She does not get any regular exercise. She tries to avoid eating excessive salt but admits that she still eats occasional salty foods.  She's had hyperlipidemia for the past 7 or 8 years. She is currently on Pravachol.  June 24, 2014:   Tracey Walton presents follow up of her HTN, and CP.  She was scheduled for an echo and myoview  Echo showed: normal LV systolic function. She has mild aortic stenosis, trivial aortic insufficiency, and mild mitral regurgitation.  Left ventricle: The cavity size was normal. Systolic function was normal. The estimated ejection fraction was in the range of 55% to 60%. Wall motion was normal; there were no regional wall motion abnormalities. Doppler parameters are consistent with abnormal left ventricular relaxation (grade 1 diastolic dysfunction). - Aortic valve: Cusp separation was reduced. There was very mild stenosis. There was trivial regurgitation. Mean gradient (S): 11 mm Hg. - Mitral valve: There was mild regurgitation  She had a [employer] myoview today.   Initial images look good. No evidence of ischemia  Has been limiting the salt in her diet.    June 8.  2016:  Tracey Walton is doing well. BP is well controlled at home.   Dec. 9, 2016  Doing well.  BP is normal at home.  Is always elevated at the office.  No CP,  Has some DOE with walking .  Does not walk regularly .   October 01, 2015: Doing well. BP is normal at home.   Always elevated in the doctors office. Does not exercise.   Back pain.   Needs to use a walker if she goes for any distance.   Has pain in her back and hands.    Past Medical History  Diagnosis Date  . Strep throat   . Chest pain 2011    negative for MI  . Hypertension   . Hyperlipidemia   . Chronic kidney disease     renal insufficiency  . Arthritis     Past Surgical History  Procedure Laterality Date  . Partial hysterectomy    . Carpal tunnel repair      left 2005 right 2003 surgery  . Right shoulder arthroscopy  2003     Current Outpatient Prescriptions  Medication Sig Dispense Refill  . amLODipine (NORVASC) 2.5 MG tablet Take 1 tablet by mouth  every evening 90 tablet 3  . atorvastatin (LIPITOR) 40 MG tablet TAKE 1 TABLET BY MOUTH  DAILY AT 6 PM. 30 tablet 0  . calcium-vitamin D (OSCAL WITH D) 500-200 MG-UNIT per tablet Take 1  tablet by mouth daily.    . carvedilol (COREG) 6.25 MG tablet Take 1 tablet by mouth two  times daily 60 tablet 0  . fish oil-omega-3 fatty acids 1000 MG capsule Take 1,200 mg by mouth daily.    Marland Kitchen FLUoxetine (PROZAC) 10 MG capsule Take 10 mg by mouth daily.    Marland Kitchen FLUoxetine (PROZAC) 20 MG capsule Take 20 mg by mouth daily.    . hydrochlorothiazide (HYDRODIURIL) 25 MG tablet Take one-half tablet by  mouth daily 45 tablet 2  . Magnesium 250 MG TABS Take 250 mg by mouth daily.    . naproxen sodium (ANAPROX) 220 MG tablet Take 220 mg by mouth daily as needed. AS NEEDED FOR MILD PAIN    . omeprazole (PRILOSEC) 20 MG capsule Take 1 capsule by mouth  daily 90 capsule 3  . traMADol  (ULTRAM) 50 MG tablet Take 50 mg by mouth every 8 (eight) hours as needed. AS NEEDED FOR MILD PAIN     No current facility-administered medications for this visit.    Allergies:   Penicillins    Social History:  The patient  reports that she has never smoked. She has never used smokeless tobacco. She reports that she does not drink alcohol or use illicit drugs.   Family History:  The patient's family history includes Heart attack in her mother; Heart disease in her mother; Heart failure in her father; Hyperlipidemia in her father and sister; Hypertension in her father and sister; Kidney failure in her father.    ROS:  Please see the history of present illness.    Review of Systems: Constitutional:  denies fever, chills, diaphoresis, appetite change and fatigue.  HEENT: denies photophobia, eye pain, redness, hearing loss, ear pain, congestion, sore throat, rhinorrhea, sneezing, neck pain, neck stiffness and tinnitus.  Respiratory: admits to SOB, DOE,   Cardiovascular: admits to chest pain, palpitations . Has some leg swelling.  Gastrointestinal: denies nausea, vomiting, abdominal pain, diarrhea, constipation, blood in stool.  Genitourinary: denies dysuria, urgency, frequency, hematuria, flank pain and difficulty urinating.  Musculoskeletal: admits to  back pain, joint swelling, arthralgias    Skin: denies pallor, rash and wound.  Neurological: denies dizziness, seizures, syncope, weakness, light-headedness, numbness and headaches.   Hematological: denies adenopathy, easy bruising, personal or family bleeding history.  Psychiatric/ Behavioral: denies suicidal ideation, mood changes, confusion, nervousness, sleep disturbance and agitation.       All other systems are reviewed and negative.    PHYSICAL EXAM: VS:  BP 158/100 mmHg  Pulse 59  Ht 5' 4.5" (1.638 m)  Wt 225 lb 6.4 oz (102.241 kg)  BMI 38.11 kg/m2 , BMI Body mass index is 38.11 kg/(m^2). GEN: Well nourished, well  developed, in no acute distress HEENT: normal Neck: no JVD, carotid bruits, or masses Cardiac: RRR; no murmurs, rubs, or gallops,no edema  Respiratory:  clear to auscultation bilaterally, normal work of breathing GI: soft, nontender, nondistended, + BS MS: no deformity or atrophy Skin: warm and dry, no rash Neuro:  Strength and sensation are intact Psych: normal   EKG:  EKG is ordered today. The ekg ordered today demonstrates sinus brady at 59.    Minimal voltage for LVH.    Recent Labs: 12/18/2014: ALT 18; BUN 25*; Creatinine, Ser 1.36*; Potassium 4.1; Sodium 138    Lipid Panel    Component Value Date/Time   CHOL 178 12/18/2014 1103   TRIG 70.0 12/18/2014 1103   HDL 46.60 12/18/2014 1103   CHOLHDL 4 12/18/2014  1103   VLDL 14.0 12/18/2014 1103   LDLCALC 118* 12/18/2014 1103      Wt Readings from Last 3 Encounters:  10/01/15 225 lb 6.4 oz (102.241 kg)  03/12/15 228 lb 6.4 oz (103.602 kg)  09/09/14 225 lb 1.9 oz (102.114 kg)      Other studies Reviewed: Additional studies/ records that were reviewed today include: . Review of the above records demonstrates:    ASSESSMENT AND PLAN:  1. Essential HTN  - .  BP is much better at home.  Continue current meds.  Does not limit her carbs.  Needs to work on diet   2. Chest pressure- her stress Myoview study is unremarkable. There is no evidence of ischemia.  3. Heart murmur -she was found have mild aortic stenosis, trace aortic insufficiency, mild mitral regurgitation. We'll continue to follow this. None of these are clinically significant at this time.  4. Hyperlipidemia - she has a history of hyperlipidemia.   She is ow on Atorvastatin 40 .   Needs to improve diet.   .    Current medicines are reviewed at length with the patient today.  The patient does not have concerns regarding medicines.  The following changes have been made:  no change   Disposition:   FU with me in 6 months    Signed, Kristeen MissPhilip Nahser, MD    10/01/2015 8:29 AM    [facility] [address], [place], [place]  [address] Phone: [phone]; Fax: [phone]

## 2015-10-01 NOTE — Patient Instructions (Addendum)
Medication Instructions:  Your physician recommends that you continue on your current medications as directed. Please refer to the Current Medication list given to you today. All requested medications were filled at office visit today to [facility]  Labwork: -None  Testing/Procedures: -None  Follow-Up: Your physician wants you to follow-up in: 6 month with Dr. Elease HashimotoNahser. You will receive a reminder letter in the mail two months in advance. If you don't receive a letter, please call our office to schedule the follow-up appointment.   Any Other Special Instructions Will Be Listed Below (If Applicable).     If you need a refill on your cardiac medications before your next appointment, please call your pharmacy.

## 2015-10-08 ENCOUNTER — Encounter: Payer: Self-pay | Admitting: Nurse Practitioner

## 2015-11-03 ENCOUNTER — Other Ambulatory Visit: Payer: Self-pay | Admitting: Cardiovascular Disease

## 2015-11-03 DIAGNOSIS — I10 Essential (primary) hypertension: Secondary | ICD-10-CM

## 2015-11-03 DIAGNOSIS — E785 Hyperlipidemia, unspecified: Secondary | ICD-10-CM

## 2015-12-11 ENCOUNTER — Emergency Department (HOSPITAL_COMMUNITY)
Admission: EM | Admit: 2015-12-11 | Discharge: 2015-12-11 | Disposition: A | Discharge status: Home / Self Care | Payer: Medicare Other | Attending: Emergency Medicine | Admitting: Emergency Medicine

## 2015-12-11 ENCOUNTER — Encounter (HOSPITAL_COMMUNITY): Payer: Self-pay

## 2015-12-11 DIAGNOSIS — W268XXD Contact with other sharp object(s), not elsewhere classified, subsequent encounter: Secondary | ICD-10-CM | POA: Insufficient documentation

## 2015-12-11 DIAGNOSIS — S81811D Laceration without foreign body, right lower leg, subsequent encounter: Secondary | ICD-10-CM | POA: Insufficient documentation

## 2015-12-11 DIAGNOSIS — Y999 Unspecified external cause status: Secondary | ICD-10-CM | POA: Insufficient documentation

## 2015-12-11 DIAGNOSIS — Z79899 Other long term (current) drug therapy: Secondary | ICD-10-CM | POA: Diagnosis not present

## 2015-12-11 DIAGNOSIS — I129 Hypertensive chronic kidney disease with stage 1 through stage 4 chronic kidney disease, or unspecified chronic kidney disease: Secondary | ICD-10-CM | POA: Diagnosis not present

## 2015-12-11 DIAGNOSIS — N189 Chronic kidney disease, unspecified: Secondary | ICD-10-CM | POA: Insufficient documentation

## 2015-12-11 DIAGNOSIS — Y929 Unspecified place or not applicable: Secondary | ICD-10-CM | POA: Diagnosis not present

## 2015-12-11 DIAGNOSIS — Y939 Activity, unspecified: Secondary | ICD-10-CM | POA: Diagnosis not present

## 2015-12-11 MED ORDER — BACITRACIN ZINC 500 UNIT/GM EX OINT
TOPICAL_OINTMENT | CUTANEOUS | Status: AC
Start: 2015-12-11 — End: 2015-12-11
  Administered 2015-12-11: 2
  Filled 2015-12-11: qty 1.8

## 2015-12-11 NOTE — ED Provider Notes (Signed)
[facility]-EMERGENCY DEPT Provider Note   CSN: [id number] Arrival date & time: 12/11/15  1119     History   Chief Complaint Chief Complaint  Patient presents with  . Extremity Laceration    HPI Tracey Walton is a 75 y.o. female.  Patient is a 75 year old female presenting today for reevaluation of the wound on her right lower extremity. Patient states she cut her leg approximately 5 days ago on on exam. She went to an urgent care where she saw wound care physician who recommended wet-to-dry dressings. She has been taking Keflex since Wednesday but feels that it might be getting a little red and wanted to have it checked before her next appointment on Tuesday. This [place] beach [place] she [place] here and this is where she is establishing ongoing care. She denies a history of diabetes. No drainage from the wound other than bleeding when she pulls the bandage off. No fever or new pain.   The history is provided by the patient.    Past Medical History:  Diagnosis Date  . Arthritis   . Chest pain 2011   negative for MI  . Chronic kidney disease    renal insufficiency  . Hyperlipidemia   . Hypertension   . Strep throat     Patient Active Problem List   Diagnosis Date Noted  . HTN (hypertension) 05/29/2014  . Chest pressure 05/29/2014  . Hyperlipidemia 05/29/2014    Past Surgical History:  Procedure Laterality Date  . carpal tunnel repair     left 2005 right 2003 surgery  . PARTIAL HYSTERECTOMY    . right shoulder arthroscopy  2003    OB History    No data available       Home Medications    Prior to Admission medications   Medication Sig Start Date End Date Taking? Authorizing Provider  amLODipine (NORVASC) 2.5 MG tablet Take 1 tablet by mouth  every evening 10/01/15   Vesta MixerPhilip J Nahser, MD  atorvastatin (LIPITOR) 40 MG tablet TAKE 1 TABLET BY MOUTH  DAILY AT 6 PM. 10/01/15   Vesta MixerPhilip J Nahser, MD  calcium-vitamin D (OSCAL WITH D) 500-200  MG-UNIT per tablet Take 1 tablet by mouth daily.    Historical Provider, MD  carvedilol (COREG) 6.25 MG tablet Take 1 tablet by mouth two  times daily 11/03/15   Vesta MixerPhilip J Nahser, MD  fish oil-omega-3 fatty acids 1000 MG capsule Take 1,200 mg by mouth daily.    Historical Provider, MD  FLUoxetine (PROZAC) 10 MG capsule Take 10 mg by mouth daily. 02/19/15   Historical Provider, MD  FLUoxetine (PROZAC) 20 MG capsule Take 20 mg by mouth daily.    Historical Provider, MD  hydrochlorothiazide (HYDRODIURIL) 25 MG tablet Take one-half tablet by  mouth daily 10/01/15   Vesta MixerPhilip J Nahser, MD  Magnesium 250 MG TABS Take 250 mg by mouth daily.    Historical Provider, MD  naproxen sodium (ANAPROX) 220 MG tablet Take 220 mg by mouth daily as needed. AS NEEDED FOR MILD PAIN    Historical Provider, MD  omeprazole (PRILOSEC) 20 MG capsule Take 1 capsule (20 mg total) by mouth daily. 10/01/15   Vesta MixerPhilip J Nahser, MD  traMADol (ULTRAM) 50 MG tablet Take 50 mg by mouth every 8 (eight) hours as needed. AS NEEDED FOR MILD PAIN    Historical Provider, MD    Family History Family History  Problem Relation Age of Onset  . Heart attack Mother   .  Heart disease Mother   . Hypertension Father   . Heart failure Father   . Kidney failure Father   . Hyperlipidemia Father   . Hypertension Sister   . Hyperlipidemia Sister     Social History Social History  Substance Use Topics  . Smoking status: Never Smoker  . Smokeless tobacco: Never Used  . Alcohol use No     Allergies   Penicillins   Review of Systems Review of Systems  All other systems reviewed and are negative.    Physical Exam Updated Vital Signs BP 148/73 (BP Location: Right Arm)   Pulse 66   Temp 97.8 F (36.6 C) (Oral)   Resp 16   SpO2 96%   Physical Exam  Constitutional: She is oriented to person, place, and time. She appears well-developed and well-nourished.  HENT:  Head: Normocephalic and atraumatic.  Pulmonary/Chest: Effort normal.    Musculoskeletal: She exhibits tenderness.       Legs: Neurological: She is alert and oriented to person, place, and time.  Skin: Skin is warm and dry.  Psychiatric: She has a normal mood and affect. Her behavior is normal.  Nursing note and vitals reviewed.    ED Treatments / Results  Labs (all labs ordered are listed, but only abnormal results are displayed) Labs Reviewed - No data to display  EKG  EKG Interpretation None       Radiology No results found.  Procedures Procedures (including critical care time)  Medications Ordered in ED Medications  bacitracin 500 UNIT/GM ointment (not administered)     Initial Impression / Assessment and Plan / ED Course  I have reviewed the triage vital signs and the nursing notes.  Pertinent labs & imaging results that were available during my care of the patient were reviewed by me and considered in my medical decision making (see chart for details).  Clinical Course   Patient is a 75 year old female with a history of heart disease and chronic renal insufficiency presenting today for wound recheck. Patient sustained a laceration approximately 5 days ago and when seen was instructed to pack the wound and do what total dry dressing changes. It was not sutured at that time. Patient has been doing that twice a day however her granddaughter felt that it may be getting more red. On exam the wound appears to be healing appropriately without signs of cellulitis. Patient is taking Keflex and is not diabetic. At this time feel patient would benefit from bacitracin and no stick bandage changes twice a day until following up with her doctor on Tuesday.  Final Clinical Impressions(s) / ED Diagnoses   Final diagnoses:  Leg laceration, right, subsequent encounter    New Prescriptions Discharge Medication List as of 12/11/2015 12:00 PM       Gwyneth Sprout, MD 12/11/15 1227

## 2015-12-11 NOTE — ED Triage Notes (Signed)
Pt struck rt leg and lacerated.  [place] was at beach. Wound is being packed. Now with redness at site.  Pt is on keflex.  No fever.

## 2015-12-14 ENCOUNTER — Ambulatory Visit: Payer: Medicare Other | Admitting: Internal Medicine

## 2016-05-12 ENCOUNTER — Other Ambulatory Visit: Payer: Self-pay | Admitting: Cardiovascular Disease

## 2016-05-12 DIAGNOSIS — E785 Hyperlipidemia, unspecified: Secondary | ICD-10-CM

## 2016-05-12 DIAGNOSIS — I10 Essential (primary) hypertension: Secondary | ICD-10-CM

## 2016-08-24 ENCOUNTER — Other Ambulatory Visit: Payer: Self-pay | Admitting: Cardiovascular Disease

## 2016-08-24 DIAGNOSIS — E785 Hyperlipidemia, unspecified: Secondary | ICD-10-CM

## 2016-08-24 DIAGNOSIS — I10 Essential (primary) hypertension: Secondary | ICD-10-CM

## 2016-08-24 MED ORDER — ATORVASTATIN CALCIUM 40 MG PO TABS: ORAL_TABLET | ORAL | 0 refills | Status: DC

## 2016-08-24 MED ORDER — CARVEDILOL 6.25 MG PO TABS: 6.2500 mg | ORAL_TABLET | Freq: Two times a day (BID) | ORAL | 0 refills | Status: DC

## 2016-10-06 ENCOUNTER — Other Ambulatory Visit (HOSPITAL_COMMUNITY): Payer: Self-pay

## 2016-10-06 NOTE — Pre-Procedure Instructions (Signed)
Tracey Walton  10/06/2016      [facility] - [place] - [address] [address] [address] [place] [address] Phone: [phone] Fax: [phone]  [facility] [place] - [id number] [place] [id number] Phone: [phone] Fax: [phone]   Your procedure is scheduled on Thursday, October 12, 2016 at 7:30 AM.   [facility] Entrance "A" Admitting Office at 5:30 AM.   Call this number if you have problems the morning of surgery: [phone]   Questions prior to day of surgery, please call [phone] between 8 & 4 PM.   Remember:  Do not eat food or drink liquids after midnight Wednesday, 10/11/16.  Take these medicines the morning of surgery with A SIP OF WATER:   Do not wear jewelry, make-up or nail polish.  Do not wear lotions, powders, perfumes or deodorant.  Do not shave 48 hours prior to surgery.   Do not bring valuables to the hospital.  [facility] is not responsible for any belongings or valuables.  Contacts, dentures or bridgework may not be worn into surgery.  Leave your suitcase in the car.  After surgery it may be brought to your room.  For patients admitted to the hospital, discharge time will be determined by your treatment team.  Patients discharged the day of surgery will not be allowed to drive home.   Special instructions: [facility] - Preparing for Surgery  Before surgery, you can play an important role.  Because skin is not sterile, your skin needs to be as free of germs as possible.  You can reduce the number of germs on you skin by washing with CHG (chlorahexidine gluconate) soap before surgery.  CHG is an antiseptic cleaner which kills germs and bonds with the skin to continue killing germs even after washing.  Please DO NOT use if you have an allergy to CHG or antibacterial soaps.  If your skin becomes reddened/irritated  stop using the CHG and inform your nurse when you arrive at Short Stay.  Do not shave (including legs and underarms) for at least 48 hours prior to the first CHG shower.  You may shave your face.  Please follow these instructions carefully:   1.  Shower with CHG Soap the night before surgery and the                    morning of Surgery.  2.  If you choose to wash your hair, wash your hair first as usual with your       normal shampoo.  3.  After you shampoo, rinse your hair and body thoroughly to remove the shampoo.  4.  Use CHG as you would any other liquid soap.  You can apply chg directly       to the skin and wash gently with scrungie or a clean washcloth.  5.  Apply the CHG Soap to your body ONLY FROM THE NECK DOWN.        Do not use on open wounds or open sores.  Avoid contact with your eyes, ears, mouth and genitals (private parts).  Wash genitals (private parts) with your normal soap.  6.  Wash thoroughly, paying special attention to the area where your surgery        will be performed.  7.  Thoroughly rinse your body with warm water from the  neck down.  8.  DO NOT shower/wash with your normal soap after using and rinsing off       the CHG Soap.  9.  Pat yourself dry with a clean towel.            10.  Wear clean pajamas.            11.  Place clean sheets on your bed the night of your first shower and do not        sleep with pets.  Day of Surgery  Do not apply any lotions/deodorants the morning of surgery.  Please wear clean clothes to the hospital.   Please read over the fact sheets that you were given.

## 2016-10-09 ENCOUNTER — Encounter (HOSPITAL_COMMUNITY): Payer: Self-pay

## 2016-10-09 ENCOUNTER — Other Ambulatory Visit: Payer: Self-pay

## 2016-10-09 ENCOUNTER — Encounter (HOSPITAL_COMMUNITY)
Admission: RE | Admit: 2016-10-09 | Discharge: 2016-10-09 | Disposition: A | Discharge status: Home / Self Care | Payer: Medicare Other | Source: Ambulatory Visit | Attending: Orthopedic Surgery | Admitting: Orthopedic Surgery

## 2016-10-09 DIAGNOSIS — I10 Essential (primary) hypertension: Secondary | ICD-10-CM

## 2016-10-09 DIAGNOSIS — Z01812 Encounter for preprocedural laboratory examination: Secondary | ICD-10-CM | POA: Insufficient documentation

## 2016-10-09 DIAGNOSIS — Z01810 Encounter for preprocedural cardiovascular examination: Secondary | ICD-10-CM | POA: Insufficient documentation

## 2016-10-09 HISTORY — DX: Depression, unspecified: F32.A

## 2016-10-09 HISTORY — DX: Pneumonia, unspecified organism: J18.9

## 2016-10-09 HISTORY — DX: Gastro-esophageal reflux disease without esophagitis: K21.9

## 2016-10-09 HISTORY — DX: Major depressive disorder, single episode, unspecified: F32.9

## 2016-10-09 HISTORY — DX: Polymyalgia rheumatica: M35.3

## 2016-10-09 HISTORY — DX: Personal history of other diseases of the digestive system: Z87.19

## 2016-10-09 HISTORY — DX: Dyspnea, unspecified: R06.00

## 2016-10-09 HISTORY — DX: Cardiac murmur, unspecified: R01.1

## 2016-10-09 LAB — CBC
HCT: 39.6 % (ref 36.0–46.0)
Hemoglobin: 12.3 g/dL (ref 12.0–15.0)
MCH: 28.9 pg (ref 26.0–34.0)
MCHC: 31.1 g/dL (ref 30.0–36.0)
MCV: 93.2 fL (ref 78.0–100.0)
Platelets: 333 10*3/uL (ref 150–400)
RBC: 4.25 MIL/uL (ref 3.87–5.11)
RDW: 16.3 % — ABNORMAL HIGH (ref 11.5–15.5)
WBC: 5.7 10*3/uL (ref 4.0–10.5)

## 2016-10-09 LAB — BASIC METABOLIC PANEL
Anion gap: 7 (ref 5–15)
BUN: 26 mg/dL — ABNORMAL HIGH (ref 6–20)
CO2: 26 mmol/L (ref 22–32)
Calcium: 9.1 mg/dL (ref 8.9–10.3)
Chloride: 108 mmol/L (ref 101–111)
Creatinine, Ser: 0.93 mg/dL (ref 0.44–1.00)
GFR calc Af Amer: >60 mL/min (ref >60)
GFR calc non Af Amer: 59 mL/min — ABNORMAL LOW (ref >60)
Glucose, Bld: 80 mg/dL (ref 65–99)
Potassium: 4.5 mmol/L (ref 3.5–5.1)
Sodium: 141 mmol/L (ref 135–145)

## 2016-10-09 LAB — SURGICAL PCR SCREEN
MRSA, PCR: NEGATIVE
Staphylococcus aureus: NEGATIVE

## 2016-10-09 NOTE — Progress Notes (Addendum)
PCP -  Cardiologist - Dr. Shaune PascalPhillip Walton- Last seen on 10/01/15  Chest x-ray - Denies EKG - 10/09/16 Stress Test - 06/19/14 ECHO - 06/19/14 Cardiac Cath - Denies  Sleep Study - Deneis CPAP - None    Pt denies having chest pain, sob, or fever at this time. The chart will be given to anesthesia for further evaluation due to pt's heart hx. All instructions explained to the pt, with a verbal understanding of the material. Pt agrees to go over the instructions while at home for a better understanding. The opportunity to ask questions was provided.

## 2016-10-09 NOTE — Pre-Procedure Instructions (Signed)
Herbert SetaRebecca V Plantz  10/09/2016      [facility] - [place], [place] - [address] [place] [address] Phone: [phone] Fax: [phone]  [facility] [place] - [id number] [place] [id number] Phone: [phone] Fax: [phone]   Your procedure is scheduled on Thursday, October 12, 2016 at 7:30 AM.   [facility]" Admitting Office at 5:30 AM.   Call this number if you have problems the morning of surgery: [phone]   Questions prior to day of surgery, please call [phone] between 8 & 4 PM.   Remember:  Do not eat food or drink liquids after midnight Wednesday, 10/11/16.  Take these medicines the morning of surgery with A SIP OF WATER: AmLODipine (NORVASC), Carvedilol (COREG), Cetirizine (ZYRTEC), FLUoxetine (PROZAC), omeprazole (PRILOSEC), If needed TraMADol (ULTRAM) for pain.  Stop taking all Aspirins, Vitamins, Fish oils, and Herbal medicaitons. Also stop taking all NSAIDS i.e. Advil, Motrin, Aleve, Anaprox, Naproxen, BC and Goody Powders.   Do not wear jewelry, make-up or nail polish.  Do not wear lotions, powders, perfumes or deodorant.  Do not shave 48 hours prior to surgery.   Do not bring valuables to the hospital.  [facility] is not responsible for any belongings or valuables.  Contacts, dentures or bridgework may not be worn into surgery.  Leave your suitcase in the car.  After surgery it may be brought to your room.  For patients admitted to the hospital, discharge time will be determined by your treatment team.  Patients discharged the day of surgery will not be allowed to drive home.   Special instructions: [place] - Preparing for Surgery  Before surgery, you can play an important role.  Because skin is not sterile, your skin needs to be as free of germs as possible.  You can reduce the number of germs  on you skin by washing with CHG (chlorahexidine gluconate) soap before surgery.  CHG is an antiseptic cleaner which kills germs and bonds with the skin to continue killing germs even after washing.  Please DO NOT use if you have an allergy to CHG or antibacterial soaps.  If your skin becomes reddened/irritated stop using the CHG and inform your nurse when you arrive at Short Stay.  Do not shave (including legs and underarms) for at least 48 hours prior to the first CHG shower.  You may shave your face.  Please follow these instructions carefully:   1.  Shower with CHG Soap the night before surgery and the                    morning of Surgery.  2.  If you choose to wash your hair, wash your hair first as usual with your       normal shampoo.  3.  After you shampoo, rinse your hair and body thoroughly to remove the shampoo.  4.  Use CHG as you would any other liquid soap.  You can apply chg directly       to the skin and wash gently with scrungie or a clean washcloth.  5.  Apply the CHG Soap to your body ONLY FROM THE NECK DOWN.        Do not use on open wounds or open sores.  Avoid contact with your eyes, ears, mouth and genitals (private parts).  Wash genitals (  private parts) with your normal soap.  6.  Wash thoroughly, paying special attention to the area where your surgery        will be performed.  7.  Thoroughly rinse your body with warm water from the neck down.  8.  DO NOT shower/wash with your normal soap after using and rinsing off       the CHG Soap.  9.  Pat yourself dry with a clean towel.            10.  Wear clean pajamas.            11.  Place clean sheets on your bed the night of your first shower and do not        sleep with pets.  Day of Surgery  Do not apply any lotions/deodorants the morning of surgery.  Please wear clean clothes to the hospital.   Please read over the fact sheets that you were given.

## 2016-10-10 ENCOUNTER — Encounter (HOSPITAL_COMMUNITY): Payer: Self-pay

## 2016-10-10 NOTE — Progress Notes (Signed)
Anesthesia Chart Review:  Pt is a [age] year old female scheduled for reverse L shoulder arthroplasty on 10/12/2016 with Francena HanlyKevin Supple, M.D.  - No PCP documented. [facility] [place] (notes in care everywhere)  - Cardiologist is Kristeen MissPhilip Nahser, MD. last office visit 10/01/15  PMH includes: very mild aortic stenosis (by 06/19/14 echo), HTN, hyperlipidemia, CKD, GERD. Never smoker. BMI 38.5.  Medications include: Amlodipine, Lipitor, carvedilol, HCTZ, Prilosec  BP (!) 143/66   Pulse 71   Temp 36.6 C (Oral)   Resp 20   Ht 5' 4.5" (1.638 m)   Wt 227 lb 8.2 oz (103.2 kg)   SpO2 97%   BMI 38.45 kg/m    Preoperative labs reviewed.  EKG 10/09/16: Sinus rhythm with short PR. Cannot rule out Inferior infarct, age undetermined. Appears stable compared with EKG 05/29/14.   Nuclear stress test 06/25/14:  - Normal stress nuclear study. Overall low risk study with no areas of significant ischemia identified. Sensitivity and specificity of study are reduced by breast attenuation as well as bowel loop attenuation artifact. - LV Ejection Fraction: 54%.  LV Wall Motion:  There is hypokinesis of the base to mid inferior wall. This may be artifactual however given attenuation.   Echo 06/19/14: - Left ventricle: The cavity size was normal. Systolic function was normal. The estimated ejection fraction was in the range of 55%to 60%. Wall motion was normal; there were no regional wallmotion abnormalities. Doppler parameters are consistent with abnormal left ventricular relaxation (grade 1 diastolic dysfunction). - Aortic valve: Cusp separation was reduced. There was very mildstenosis. There was trivial regurgitation. Mean gradient (S): 11mm Hg. - Mitral valve: There was mild regurgitation.  If no changes, I anticipate pt can proceed with surgery as scheduled.   Rica Mastngela Carrick Rijos, FNP-BC [facility]Anesthesiology Phone:  [phone] 10/10/2016 11:48 AM

## 2016-10-11 MED ORDER — SODIUM CHLORIDE 0.9 % IV SOLN
1000.0000 mg | INTRAVENOUS | Status: AC
  Administered 2016-10-12: 1000 mg via INTRAVENOUS
  Filled 2016-10-11: qty 1100

## 2016-10-11 NOTE — Anesthesia Preprocedure Evaluation (Addendum)
Anesthesia Evaluation  Patient identified by MRN, date of birth, ID band Patient awake    Reviewed: Allergy & Precautions, NPO status , Patient's Chart, lab work & pertinent test results  Airway Mallampati: III  TM Distance: >3 FB Neck ROM: Full    Dental no notable dental hx.    Pulmonary neg pulmonary ROS,    Pulmonary exam normal breath sounds clear to auscultation       Cardiovascular hypertension, Pt. on medications Normal cardiovascular exam Rhythm:Regular Rate:Normal     Neuro/Psych Depression    GI/Hepatic Neg liver ROS, GERD  Medicated,  Endo/Other  negative endocrine ROS  Renal/GU negative Renal ROS  negative genitourinary   Musculoskeletal  (+) Arthritis , Osteoarthritis,    Abdominal   Peds negative pediatric ROS (+)  Hematology negative hematology ROS (+)   Anesthesia Other Findings   Reproductive/Obstetrics negative OB ROS                             Anesthesia Physical Anesthesia Plan  ASA: III  Anesthesia Plan: General   Post-op Pain Management:  Regional for Post-op pain   Induction: Intravenous  PONV Risk Score and Plan: 2 and Ondansetron, Dexamethasone and Treatment may vary due to age or medical condition  Airway Management Planned: Oral ETT  Additional Equipment:   Intra-op Plan:   Post-operative Plan: Extubation in OR  Informed Consent: I have reviewed the patients History and Physical, chart, labs and discussed the procedure including the risks, benefits and alternatives for the proposed anesthesia with the patient or authorized representative who has indicated his/her understanding and acceptance.   Dental advisory given  Plan Discussed with:   Anesthesia Plan Comments: (  )        Anesthesia Quick Evaluation

## 2016-10-12 ENCOUNTER — Encounter (HOSPITAL_COMMUNITY)
Admission: RE | Disposition: A | Discharge status: Home Health | Payer: Self-pay | Source: Ambulatory Visit | Attending: Orthopedic Surgery

## 2016-10-12 ENCOUNTER — Inpatient Hospital Stay (HOSPITAL_COMMUNITY)
Admission: RE | Admit: 2016-10-12 | Discharge: 2016-10-13 | DRG: 483 | Disposition: A | Discharge status: Home Health | Payer: Medicare Other | Source: Ambulatory Visit | Attending: Orthopedic Surgery | Admitting: Orthopedic Surgery

## 2016-10-12 ENCOUNTER — Inpatient Hospital Stay (HOSPITAL_COMMUNITY): Payer: Medicare Other | Admitting: Anesthesiology

## 2016-10-12 ENCOUNTER — Encounter (HOSPITAL_COMMUNITY): Payer: Self-pay

## 2016-10-12 ENCOUNTER — Other Ambulatory Visit: Payer: Self-pay | Admitting: Cardiovascular Disease

## 2016-10-12 ENCOUNTER — Inpatient Hospital Stay (HOSPITAL_COMMUNITY): Payer: Medicare Other | Admitting: Emergency Medicine

## 2016-10-12 DIAGNOSIS — I10 Essential (primary) hypertension: Secondary | ICD-10-CM | POA: Diagnosis present

## 2016-10-12 DIAGNOSIS — M75102 Unspecified rotator cuff tear or rupture of left shoulder, not specified as traumatic: Principal | ICD-10-CM | POA: Diagnosis present

## 2016-10-12 DIAGNOSIS — E785 Hyperlipidemia, unspecified: Secondary | ICD-10-CM | POA: Diagnosis present

## 2016-10-12 DIAGNOSIS — F329 Major depressive disorder, single episode, unspecified: Secondary | ICD-10-CM | POA: Diagnosis present

## 2016-10-12 DIAGNOSIS — M25512 Pain in left shoulder: Secondary | ICD-10-CM | POA: Diagnosis present

## 2016-10-12 DIAGNOSIS — Z79899 Other long term (current) drug therapy: Secondary | ICD-10-CM | POA: Diagnosis not present

## 2016-10-12 DIAGNOSIS — Z96619 Presence of unspecified artificial shoulder joint: Secondary | ICD-10-CM

## 2016-10-12 DIAGNOSIS — K219 Gastro-esophageal reflux disease without esophagitis: Secondary | ICD-10-CM | POA: Diagnosis present

## 2016-10-12 HISTORY — PX: REVERSE SHOULDER ARTHROPLASTY: SHX5054

## 2016-10-12 SURGERY — ARTHROPLASTY, SHOULDER, TOTAL, REVERSE
Anesthesia: General | Site: Shoulder | Laterality: Left

## 2016-10-12 MED ORDER — ACETAMINOPHEN 650 MG RE SUPP: 650.0000 mg | Freq: Four times a day (QID) | RECTAL | Status: DC | PRN

## 2016-10-12 MED ORDER — FENTANYL CITRATE (PF) 250 MCG/5ML IJ SOLN
INTRAMUSCULAR | Status: DC | PRN
  Administered 2016-10-12: 50 ug via INTRAVENOUS

## 2016-10-12 MED ORDER — LACTATED RINGERS IV SOLN
INTRAVENOUS | Status: DC
  Administered 2016-10-12: 12:00:00 via INTRAVENOUS

## 2016-10-12 MED ORDER — MENTHOL 3 MG MT LOZG: 1.0000 | LOZENGE | OROMUCOSAL | Status: DC | PRN

## 2016-10-12 MED ORDER — ACETAMINOPHEN 325 MG PO TABS
650.0000 mg | ORAL_TABLET | Freq: Four times a day (QID) | ORAL | Status: DC | PRN
  Administered 2016-10-12 – 2016-10-13 (×3): 650 mg via ORAL
  Filled 2016-10-12 (×3): qty 2

## 2016-10-12 MED ORDER — DIAZEPAM 5 MG PO TABS: 2.5000 mg | ORAL_TABLET | Freq: Four times a day (QID) | ORAL | Status: DC | PRN

## 2016-10-12 MED ORDER — CEFAZOLIN SODIUM 1 G IJ SOLR
INTRAMUSCULAR | Status: DC | PRN
Start: 2016-10-12 — End: 2016-10-12
  Administered 2016-10-12: .1 g via INTRAMUSCULAR
  Administered 2016-10-12: 1 g via INTRAMUSCULAR
  Administered 2016-10-12: .9 g via INTRAMUSCULAR

## 2016-10-12 MED ORDER — FENTANYL CITRATE (PF) 250 MCG/5ML IJ SOLN
INTRAMUSCULAR | Status: AC
  Filled 2016-10-12: qty 5

## 2016-10-12 MED ORDER — DIPHENHYDRAMINE HCL 12.5 MG/5ML PO ELIX
12.5000 mg | ORAL_SOLUTION | ORAL | Status: DC | PRN
  Administered 2016-10-13: 25 mg via ORAL
  Filled 2016-10-12: qty 10

## 2016-10-12 MED ORDER — MIDAZOLAM HCL 2 MG/2ML IJ SOLN
INTRAMUSCULAR | Status: AC
  Filled 2016-10-12: qty 2

## 2016-10-12 MED ORDER — POLYETHYLENE GLYCOL 3350 17 G PO PACK: 17.0000 g | PACK | Freq: Every day | ORAL | Status: DC | PRN

## 2016-10-12 MED ORDER — DEXAMETHASONE SODIUM PHOSPHATE 10 MG/ML IJ SOLN
INTRAMUSCULAR | Status: DC | PRN
  Administered 2016-10-12: 10 mg via INTRAVENOUS

## 2016-10-12 MED ORDER — SUCCINYLCHOLINE CHLORIDE 200 MG/10ML IV SOSY
PREFILLED_SYRINGE | INTRAVENOUS | Status: AC
  Filled 2016-10-12: qty 10

## 2016-10-12 MED ORDER — METOCLOPRAMIDE HCL 10 MG/10ML PO SOLN
10.0000 mg | Freq: Once | ORAL | Status: DC
  Filled 2016-10-12: qty 10

## 2016-10-12 MED ORDER — ATORVASTATIN CALCIUM 40 MG PO TABS
40.0000 mg | ORAL_TABLET | Freq: Every day | ORAL | Status: DC
  Administered 2016-10-12: 40 mg via ORAL
  Filled 2016-10-12: qty 1

## 2016-10-12 MED ORDER — PHENYLEPHRINE 40 MCG/ML (10ML) SYRINGE FOR IV PUSH (FOR BLOOD PRESSURE SUPPORT)
PREFILLED_SYRINGE | INTRAVENOUS | Status: AC
  Filled 2016-10-12: qty 10

## 2016-10-12 MED ORDER — MIDAZOLAM HCL 2 MG/2ML IJ SOLN
INTRAMUSCULAR | Status: DC | PRN
Start: 2016-10-12 — End: 2016-10-12
  Administered 2016-10-12 (×2): 1 mg via INTRAVENOUS

## 2016-10-12 MED ORDER — MEPERIDINE HCL 25 MG/ML IJ SOLN: 6.2500 mg | INTRAMUSCULAR | Status: DC | PRN

## 2016-10-12 MED ORDER — ALUM & MAG HYDROXIDE-SIMETH 200-200-20 MG/5ML PO SUSP
30.0000 mL | ORAL | Status: DC | PRN
  Administered 2016-10-13: 30 mL via ORAL
  Filled 2016-10-12: qty 30

## 2016-10-12 MED ORDER — SUGAMMADEX SODIUM 200 MG/2ML IV SOLN
INTRAVENOUS | Status: DC | PRN
  Administered 2016-10-12: 200 mg via INTRAVENOUS

## 2016-10-12 MED ORDER — HYDROMORPHONE HCL 1 MG/ML IJ SOLN
1.0000 mg | INTRAMUSCULAR | Status: DC | PRN
  Administered 2016-10-12 – 2016-10-13 (×2): 1 mg via INTRAVENOUS
  Filled 2016-10-12 (×2): qty 1

## 2016-10-12 MED ORDER — ROCURONIUM BROMIDE 50 MG/5ML IV SOLN
INTRAVENOUS | Status: AC
  Filled 2016-10-12: qty 1

## 2016-10-12 MED ORDER — DOCUSATE SODIUM 100 MG PO CAPS
100.0000 mg | ORAL_CAPSULE | Freq: Two times a day (BID) | ORAL | Status: DC
  Administered 2016-10-12 – 2016-10-13 (×3): 100 mg via ORAL
  Filled 2016-10-12 (×3): qty 1

## 2016-10-12 MED ORDER — CARVEDILOL 6.25 MG PO TABS
6.2500 mg | ORAL_TABLET | Freq: Two times a day (BID) | ORAL | Status: DC
  Administered 2016-10-12 – 2016-10-13 (×2): 6.25 mg via ORAL
  Filled 2016-10-12 (×2): qty 1

## 2016-10-12 MED ORDER — SUGAMMADEX SODIUM 200 MG/2ML IV SOLN
INTRAVENOUS | Status: AC
  Filled 2016-10-12: qty 2

## 2016-10-12 MED ORDER — HYDROCHLOROTHIAZIDE 25 MG PO TABS
12.5000 mg | ORAL_TABLET | Freq: Every day | ORAL | Status: DC
Start: 2016-10-12 — End: 2016-10-13
  Administered 2016-10-12 – 2016-10-13 (×2): 12.5 mg via ORAL
  Filled 2016-10-12 (×2): qty 1

## 2016-10-12 MED ORDER — ONDANSETRON HCL 4 MG/2ML IJ SOLN
INTRAMUSCULAR | Status: DC | PRN
  Administered 2016-10-12: 4 mg via INTRAVENOUS

## 2016-10-12 MED ORDER — LIDOCAINE HCL (CARDIAC) 20 MG/ML IV SOLN
INTRAVENOUS | Status: AC
  Filled 2016-10-12: qty 5

## 2016-10-12 MED ORDER — ROPIVACAINE HCL 5 MG/ML IJ SOLN
INTRAMUSCULAR | Status: DC | PRN
  Administered 2016-10-12: 25 mL via PERINEURAL

## 2016-10-12 MED ORDER — CHLORHEXIDINE GLUCONATE 4 % EX LIQD: 60.0000 mL | Freq: Once | CUTANEOUS | Status: DC

## 2016-10-12 MED ORDER — METOCLOPRAMIDE HCL 5 MG/ML IJ SOLN
INTRAMUSCULAR | Status: AC
  Administered 2016-10-12: 10 mg
  Filled 2016-10-12: qty 2

## 2016-10-12 MED ORDER — BISACODYL 5 MG PO TBEC: 5.0000 mg | DELAYED_RELEASE_TABLET | Freq: Every day | ORAL | Status: DC | PRN

## 2016-10-12 MED ORDER — PHENOL 1.4 % MT LIQD: 1.0000 | OROMUCOSAL | Status: DC | PRN

## 2016-10-12 MED ORDER — AMLODIPINE BESYLATE 2.5 MG PO TABS
2.5000 mg | ORAL_TABLET | Freq: Every evening | ORAL | Status: DC
  Administered 2016-10-12: 2.5 mg via ORAL
  Filled 2016-10-12: qty 1

## 2016-10-12 MED ORDER — EPHEDRINE 5 MG/ML INJ
INTRAVENOUS | Status: AC
  Filled 2016-10-12: qty 10

## 2016-10-12 MED ORDER — PANTOPRAZOLE SODIUM 40 MG PO TBEC
40.0000 mg | DELAYED_RELEASE_TABLET | Freq: Every day | ORAL | Status: DC
  Administered 2016-10-13: 40 mg via ORAL
  Filled 2016-10-12: qty 1

## 2016-10-12 MED ORDER — ONDANSETRON HCL 4 MG/2ML IJ SOLN
4.0000 mg | Freq: Four times a day (QID) | INTRAMUSCULAR | Status: DC | PRN
  Administered 2016-10-13: 4 mg via INTRAVENOUS
  Filled 2016-10-12: qty 2

## 2016-10-12 MED ORDER — ONDANSETRON HCL 4 MG PO TABS: 4.0000 mg | ORAL_TABLET | Freq: Four times a day (QID) | ORAL | Status: DC | PRN

## 2016-10-12 MED ORDER — PHENYLEPHRINE HCL 10 MG/ML IJ SOLN
INTRAVENOUS | Status: DC | PRN
  Administered 2016-10-12: 30 ug/min via INTRAVENOUS

## 2016-10-12 MED ORDER — OXYCODONE HCL 5 MG PO TABS
5.0000 mg | ORAL_TABLET | ORAL | Status: DC | PRN
  Administered 2016-10-12 – 2016-10-13 (×5): 10 mg via ORAL
  Filled 2016-10-12 (×5): qty 2

## 2016-10-12 MED ORDER — 0.9 % SODIUM CHLORIDE (POUR BTL) OPTIME
TOPICAL | Status: DC | PRN
  Administered 2016-10-12: 1000 mL

## 2016-10-12 MED ORDER — KETOROLAC TROMETHAMINE 15 MG/ML IJ SOLN
7.5000 mg | Freq: Four times a day (QID) | INTRAMUSCULAR | Status: AC
  Administered 2016-10-12 – 2016-10-13 (×4): 7.5 mg via INTRAVENOUS
  Filled 2016-10-12 (×4): qty 1

## 2016-10-12 MED ORDER — PROPOFOL 10 MG/ML IV BOLUS
INTRAVENOUS | Status: AC
  Filled 2016-10-12: qty 20

## 2016-10-12 MED ORDER — ONDANSETRON HCL 4 MG/2ML IJ SOLN
INTRAMUSCULAR | Status: AC
  Filled 2016-10-12: qty 2

## 2016-10-12 MED ORDER — LIDOCAINE 2% (20 MG/ML) 5 ML SYRINGE
INTRAMUSCULAR | Status: DC | PRN
  Administered 2016-10-12: 100 mg via INTRAVENOUS

## 2016-10-12 MED ORDER — TRANEXAMIC ACID 1000 MG/10ML IV SOLN
1000.0000 mg | Freq: Once | INTRAVENOUS | Status: AC
  Administered 2016-10-12: 1000 mg via INTRAVENOUS
  Filled 2016-10-12: qty 10

## 2016-10-12 MED ORDER — CEFAZOLIN SODIUM-DEXTROSE 2-4 GM/100ML-% IV SOLN: 2.0000 g | INTRAVENOUS | Status: DC

## 2016-10-12 MED ORDER — FENTANYL CITRATE (PF) 100 MCG/2ML IJ SOLN: 25.0000 ug | INTRAMUSCULAR | Status: DC | PRN

## 2016-10-12 MED ORDER — MAGNESIUM CITRATE PO SOLN: 1.0000 | Freq: Once | ORAL | Status: DC | PRN

## 2016-10-12 MED ORDER — CEFAZOLIN SODIUM-DEXTROSE 2-4 GM/100ML-% IV SOLN
2.0000 g | Freq: Four times a day (QID) | INTRAVENOUS | Status: AC
  Administered 2016-10-12 – 2016-10-13 (×3): 2 g via INTRAVENOUS
  Filled 2016-10-12 (×3): qty 100

## 2016-10-12 MED ORDER — PROPOFOL 10 MG/ML IV BOLUS
INTRAVENOUS | Status: DC | PRN
  Administered 2016-10-12: 150 mg via INTRAVENOUS

## 2016-10-12 MED ORDER — PHENYLEPHRINE 40 MCG/ML (10ML) SYRINGE FOR IV PUSH (FOR BLOOD PRESSURE SUPPORT)
PREFILLED_SYRINGE | INTRAVENOUS | Status: DC | PRN
  Administered 2016-10-12: 120 ug via INTRAVENOUS

## 2016-10-12 MED ORDER — LACTATED RINGERS IV SOLN
INTRAVENOUS | Status: DC | PRN
  Administered 2016-10-12: 07:00:00 via INTRAVENOUS

## 2016-10-12 MED ORDER — METOCLOPRAMIDE HCL 5 MG/ML IJ SOLN: 5.0000 mg | Freq: Three times a day (TID) | INTRAMUSCULAR | Status: DC | PRN

## 2016-10-12 MED ORDER — METOCLOPRAMIDE HCL 5 MG PO TABS: 5.0000 mg | ORAL_TABLET | Freq: Three times a day (TID) | ORAL | Status: DC | PRN

## 2016-10-12 MED ORDER — METOCLOPRAMIDE HCL 5 MG/ML IJ SOLN
10.0000 mg | Freq: Once | INTRAMUSCULAR | Status: AC
  Administered 2016-10-12: 10 mg via INTRAVENOUS

## 2016-10-12 MED ORDER — FLUOXETINE HCL 10 MG PO CAPS
10.0000 mg | ORAL_CAPSULE | Freq: Every day | ORAL | Status: DC
  Administered 2016-10-13: 10 mg via ORAL
  Filled 2016-10-12: qty 1

## 2016-10-12 MED ORDER — ROCURONIUM BROMIDE 10 MG/ML (PF) SYRINGE
PREFILLED_SYRINGE | INTRAVENOUS | Status: DC | PRN
  Administered 2016-10-12: 50 mg via INTRAVENOUS

## 2016-10-12 SURGICAL SUPPLY — 63 items
BASEPLATE GLENOID SHLDR SM (Shoulder) ×2 IMPLANT
BLADE SAW SGTL 83.5X18.5 (BLADE) ×2 IMPLANT
COVER SURGICAL LIGHT HANDLE (MISCELLANEOUS) ×2 IMPLANT
CUP SUT UNIV REVERS 36 NEUTRAL (Cup) ×2 IMPLANT
DERMABOND ADHESIVE PROPEN (GAUZE/BANDAGES/DRESSINGS) ×1
DERMABOND ADVANCED (GAUZE/BANDAGES/DRESSINGS) ×1
DERMABOND ADVANCED .7 DNX12 (GAUZE/BANDAGES/DRESSINGS) ×1 IMPLANT
DERMABOND ADVANCED .7 DNX6 (GAUZE/BANDAGES/DRESSINGS) ×1 IMPLANT
DRAPE ORTHO SPLIT 77X108 STRL (DRAPES) ×2
DRAPE SURG 17X11 SM STRL (DRAPES) ×2 IMPLANT
DRAPE SURG ORHT 6 SPLT 77X108 (DRAPES) ×2 IMPLANT
DRAPE U-SHAPE 47X51 STRL (DRAPES) ×2 IMPLANT
DRSG AQUACEL AG ADV 3.5X10 (GAUZE/BANDAGES/DRESSINGS) ×2 IMPLANT
DURAPREP 26ML APPLICATOR (WOUND CARE) ×2 IMPLANT
ELECT BLADE 4.0 EZ CLEAN MEGAD (MISCELLANEOUS) ×2
ELECT CAUTERY BLADE 6.4 (BLADE) ×2 IMPLANT
ELECT REM PT RETURN 9FT ADLT (ELECTROSURGICAL) ×2
ELECTRODE BLDE 4.0 EZ CLN MEGD (MISCELLANEOUS) ×1 IMPLANT
ELECTRODE REM PT RTRN 9FT ADLT (ELECTROSURGICAL) ×1 IMPLANT
FACESHIELD WRAPAROUND (MASK) ×6 IMPLANT
GLENOSPHERE LATERAL 36MM+4 (Shoulder) ×2 IMPLANT
GLOVE BIO SURGEON STRL SZ7.5 (GLOVE) ×2 IMPLANT
GLOVE BIO SURGEON STRL SZ8 (GLOVE) ×2 IMPLANT
GLOVE EUDERMIC 7 POWDERFREE (GLOVE) ×2 IMPLANT
GLOVE SS BIOGEL STRL SZ 7.5 (GLOVE) ×1 IMPLANT
GLOVE SUPERSENSE BIOGEL SZ 7.5 (GLOVE) ×1
GOWN STRL REUS W/ TWL LRG LVL3 (GOWN DISPOSABLE) ×1 IMPLANT
GOWN STRL REUS W/ TWL XL LVL3 (GOWN DISPOSABLE) ×2 IMPLANT
GOWN STRL REUS W/TWL LRG LVL3 (GOWN DISPOSABLE) ×1
GOWN STRL REUS W/TWL XL LVL3 (GOWN DISPOSABLE) ×2
KIT BASIN OR (CUSTOM PROCEDURE TRAY) ×2 IMPLANT
KIT ROOM TURNOVER OR (KITS) ×2 IMPLANT
LINER HUMERAL 36 +3MM SM (Shoulder) ×2 IMPLANT
MANIFOLD NEPTUNE II (INSTRUMENTS) ×2 IMPLANT
NDL SUT 6 .5 CRC .975X.05 MAYO (NEEDLE) IMPLANT
NEEDLE HYPO 25GX1X1/2 BEV (NEEDLE) IMPLANT
NEEDLE MAYO TAPER (NEEDLE)
NEEDLE TAPERED W/ NITINOL LOOP (MISCELLANEOUS) ×2 IMPLANT
NS IRRIG 1000ML POUR BTL (IV SOLUTION) ×2 IMPLANT
PACK SHOULDER (CUSTOM PROCEDURE TRAY) ×2 IMPLANT
PAD ARMBOARD 7.5X6 YLW CONV (MISCELLANEOUS) ×4 IMPLANT
RESTRAINT HEAD UNIVERSAL NS (MISCELLANEOUS) ×2 IMPLANT
SCREW CENTRAL NONLOCK 6.5X20MM (Shoulder) ×2 IMPLANT
SCREW LOCK PERIPHERAL 30MM (Shoulder) ×2 IMPLANT
SCREW LOCK PERIPHERAL 36MM (Screw) ×2 IMPLANT
SET PIN UNIVERSAL REVERSE (SET/KITS/TRAYS/PACK) ×2 IMPLANT
SLING ARM FOAM STRAP LRG (SOFTGOODS) ×2 IMPLANT
SPACER SHLD UNI REV 36 +6 (Shoulder) ×2 IMPLANT
SPONGE LAP 18X18 X RAY DECT (DISPOSABLE) ×2 IMPLANT
SPONGE LAP 4X18 X RAY DECT (DISPOSABLE) ×2 IMPLANT
STEM REVERS UNIVERS SZ7 CAP (Shoulder) ×2 IMPLANT
SUCTION FRAZIER HANDLE 10FR (MISCELLANEOUS) ×1
SUCTION TUBE FRAZIER 10FR DISP (MISCELLANEOUS) ×1 IMPLANT
SUT FIBERWIRE #2 38 T-5 BLUE (SUTURE) ×6
SUT MNCRL AB 3-0 PS2 18 (SUTURE) ×2 IMPLANT
SUT MON AB 2-0 CT1 27 (SUTURE) IMPLANT
SUT VIC AB 1 CT1 27 (SUTURE) ×1
SUT VIC AB 1 CT1 27XBRD ANBCTR (SUTURE) ×1 IMPLANT
SUTURE FIBERWR #2 38 T-5 BLUE (SUTURE) ×3 IMPLANT
SYR CONTROL 10ML LL (SYRINGE) IMPLANT
TOWEL OR 17X24 6PK STRL BLUE (TOWEL DISPOSABLE) IMPLANT
TOWEL OR 17X26 10 PK STRL BLUE (TOWEL DISPOSABLE) ×2 IMPLANT
WATER STERILE IRR 1000ML POUR (IV SOLUTION) IMPLANT

## 2016-10-12 NOTE — Transfer of Care (Signed)
Immediate Anesthesia Transfer of Care Note  Patient: Tracey Walton  Procedure(s) Performed: Procedure(s): REVERSE LEFT SHOULDER ARTHROPLASTY (Left)  Patient Location: PACU  Anesthesia Type:GA combined with regional for post-op pain  Level of Consciousness: awake, alert , oriented and patient cooperative  Airway & Oxygen Therapy: Patient Spontanous Breathing and Patient connected to face mask oxygen  Post-op Assessment: Report given to RN and Post -op Vital signs reviewed and stable  Post vital signs: Reviewed and stable  Last Vitals:  Vitals:   10/12/16 0717 10/12/16 0718  BP:    Pulse: 70 70  Resp: (!) 22 (!) 22  Temp:      Last Pain:  Vitals:   10/12/16 0558  TempSrc: Oral  PainSc: 5          Complications: No apparent anesthesia complications

## 2016-10-12 NOTE — H&P (Signed)
Herbert Setaebecca V Gotwalt    Chief Complaint: Left rotator cuff tear arthropathy HPI: The patient is a 76 y.o. female with end stage left shoulder rotator cuff tear arthropathy  Past Medical History:  Diagnosis Date  . Arthritis   . Chest pain 2011   negative for MI  . Chronic kidney disease    renal insufficiency. 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 Depression   . Dyspnea    SOB while walking  . GERD (gastroesophageal reflux disease)   . Heart murmur   . History of hiatal hernia   . Hyperlipidemia   . Hypertension   . Pneumonia   . Polymyalgia (HCC)   . Strep throat     Past Surgical History:  Procedure Laterality Date  . carpal tunnel repair     left 2005 right 2003 surgery  . EYE SURGERY    . KNEE SURGERY     Bilateral  . PARTIAL HYSTERECTOMY    . right shoulder arthroscopy  2003    Family History  Problem Relation Age of Onset  . Heart attack Mother   . Heart disease Mother   . Hypertension Father   . Heart failure Father   . Kidney failure Father   . Hyperlipidemia Father   . Hypertension Sister   . Hyperlipidemia Sister     Social History:  reports that she has never smoked. She has never used smokeless tobacco. She reports that she does not drink alcohol or use drugs.   Medications Prior to Admission  Medication Sig Dispense Refill  . amLODipine (NORVASC) 2.5 MG tablet TAKE 1 TABLET BY MOUTH  EVERY EVENING (Patient taking differently: TAKE 2.5 MG BY MOUTH  EVERY EVENING) 30 tablet 0  . atorvastatin (LIPITOR) 40 MG tablet TAKE 1 TABLET BY MOUTH  DAILY AT 6 PM. (Patient taking differently: Take 40 mg by mouth daily at 6 PM. ) 30 tablet 0  . calcium-vitamin D (OSCAL WITH D) 500-200 MG-UNIT per tablet Take 1 tablet by mouth daily.    . carvedilol (COREG) 6.25 MG tablet Take 1 tablet (6.25 mg total) by mouth 2 (two) times daily. 60 tablet 0  . cetirizine (ZYRTEC) 10 MG tablet Take 10 mg by mouth daily.    . fish oil-omega-3 fatty acids 1000 MG  capsule Take 1,200 mg by mouth daily.    Marland Kitchen. FLUoxetine (PROZAC) 10 MG capsule Take 10 mg by mouth daily.    . hydrochlorothiazide (HYDRODIURIL) 25 MG tablet Take one-half tablet by  mouth daily (Patient taking differently: Take 12.5 mg by mouth daily. ) 90 tablet 3  . Magnesium 250 MG TABS Take 250 mg by mouth daily.    . naproxen sodium (ANAPROX) 220 MG tablet Take 220 mg by mouth daily as needed (for pain).     Marland Kitchen. omeprazole (PRILOSEC) 20 MG capsule TAKE 1 CAPSULE BY MOUTH  DAILY (Patient taking differently: TAKE 20 MG BY MOUTH  DAILY) 30 capsule 0  . traMADol (ULTRAM) 50 MG tablet Take 50 mg by mouth 4 (four) times daily.        Physical Exam: Left shoulder with painful and restricted motion as noted at recent office visits  Vitals     Assessment/Plan  Impression: Left rotator cuff tear arthropathy  Plan of Action: Procedure(s): REVERSE LEFT SHOULDER ARTHROPLASTY  Johnpaul Gillentine M Dannon Nguyenthi 10/12/2016, 5:31 AM Contact # [phone]

## 2016-10-12 NOTE — Discharge Instructions (Signed)

## 2016-10-12 NOTE — Op Note (Signed)
10/12/2016  9:04 AM  PATIENT:   Tracey Walton  76 y.o. female  PRE-OPERATIVE DIAGNOSIS:  Left shoulder rotator cuff tear arthropathy  POST-OPERATIVE DIAGNOSIS:  same  PROCEDURE:  L reverse shoulder arthroplasty #7 stem, +6 spacer, +3 poly, 36/+4 glenosphere  SURGEON:  Savita Runner, Vania ReaKevin M. M.D.  ASSISTANTS: Shuford pac   ANESTHESIA:   GET + ISB  EBL: 150  SPECIMEN:  none  Drains: none   PATIENT DISPOSITION:  PACU - hemodynamically stable.    PLAN OF CARE: Admit for overnight observation  Dictation# [id number]   Contact # [phone]

## 2016-10-12 NOTE — Op Note (Signed)
NAME:  Tracey Walton, Tracey Walton                   ACCOUNT NO.:  MEDICAL RECORD NO.:  [id number]  LOCATION:                                 FACILITY:  PHYSICIAN:  Vania Rea. Madelyn Tlatelpa, M.D.       DATE OF BIRTH:  DATE OF PROCEDURE:  10/12/2016 DATE OF DISCHARGE:                              OPERATIVE REPORT   PREOPERATIVE DIAGNOSIS:  End-stage left shoulder rotator cuff tear arthropathy.  POSTOPERATIVE DIAGNOSIS:  End-stage left shoulder rotator cuff tear arthropathy.  PROCEDURE:  Left shoulder reverse arthroplasty utilizing a press-fit size 7 Arthrex stem with a +6 spacer and a +3 polyethylene insert on a neutral metaphysis and a 36 +4 glenosphere and a small baseplate.  SURGEON:  Vania Rea. Kyllian Clingerman, M.D.  Threasa HeadsFrench Ana A. Shuford, P.A.-C.  ANESTHESIA:  General endotracheal as well as interscalene block.  ESTIMATED BLOOD LOSS:  150 mL.  DRAINS:  None.  HISTORY:  Tracey Walton is a 76 year old female who has had chronic and progressive increasing left shoulder pain related to end-stage rotator cuff tear arthropathy.  Due to increasing pain and functional limitations, she was brought to the operating room at this time for left reverse shoulder arthroplasty as described below.  Preoperatively, I counseled Tracey Walton regarding treatment options and potential risks versus benefits thereof.  Possible surgical complications were reviewed including bleeding, infection, neurovascular injury, persistent pain, loss of motion, anesthetic complication, failure of the implant, possible need for additional surgery.  She understands and accepts and agrees with our planned procedure.  PROCEDURE IN DETAIL:  After undergoing routine preop evaluation, the patient received prophylactic antibiotics.  An interscalene block was established in the holding area by the Anesthesia Department.  Of note, she did tolerate her Ancef without difficulty.  She was brought to the operative room, placed supine on the  operative table.  Underwent smooth induction of a general endotracheal anesthesia.  Placed in beach-chair position and appropriately padded and protected.  The left shoulder girdle region was then sterilely prepped and draped in standard fashion. Time-out was called.  An anterior deltopectoral approach to the left shoulder was made through a 10 cm incision.  Skin flaps were elevated. Dissection carried deeply.  Electrocautery was used for hemostasis.  The deltopectoral interval was then developed with vein taken laterally and the upper centimeter of the pectoralis major was tenotomized to improve exposure and the conjoint tendon was then mobilized and retracted medially.  On the deep layer, it was found that there was complete absence of the entirety of the rotator cuff from the mid equator superiorly with no obvious viable subscapularis.  In addition, the biceps tendon had previously ruptured with no obvious remnant.  We performed a bursectomy and divided the capsule attachments from the anterior and anterior-inferior aspects of the humeral neck, allowing delivery of the humeral head through the wound.  We then outlined our proposed humeral head resection and approximately 20 degrees retroversion was performed with an oscillating saw.  Metal cap placed over the cut surface of the proximal humerus.  At this point, we then exposed the glenoid with combination of Fukuda, pitchfork, and snake tongue retractors.  We excised  the remnant of the glenoid labrum, getting complete visualization of the entire glenoid margin.  A guide pin was then inserted into the center of the glenoid.  We then reamed with the +2 reamer, gaining our central socket and then the peripheral reamer was then utilized, obtaining excellent bony bed for our base plate.  A small baseplate was then selected, this was impacted.  Our central lag screw was placed with excellent interference fit and fixation, then we placed the  inferior and superior locking screws again with excellent bony purchase and fixation.  The peripheral reamer was then placed at this time.  We irrigated the baseplate.  The 36 +4 glenosphere was then inserted over the base plate, impacted, and then confirmed good stability.  At this point, we then returned our attention back to the proximal humerus where the metal cap was removed.  We performed hand reaming up to size 7 and broached up to size 7 which had excellent fit and fixation.  We then reamed the metaphysis with the 36 metaphyseal reamer.  The trial implant was then placed.  Trial reductions were performed, showing good soft tissue balance.  At this point, on the back table, we then assembled our final humeral stem with the appropriate metaphysis at 135-degree angle.  This was then impacted into position.  We then performed a series of trial reductions and ultimately felt that a total +9 off the implant was appropriate, so we used a +6 spacer, +3 poly, this was assembled and final reduction was performed, and this gave us excellent soft tissue balance, good shoulder motion, and good stability.  The joint was then copiously irrigated. Hemostasis was obtained.  There was no residual subscapularis repair. Deltopectoral interval was then reapproximated with a series of figure- of-eight #1 Vicryl sutures.  2-0 Monocryl used for the subcu layer, intracuticular 3-0 Monocryl for the skin followed by Dermabond and Aquacel dressing.  Left arm was placed in a sling.  The patient was awakened, extubated, and taken to the recovery room in stable condition.  Tracy A. Shuford, P.A.-C., was used as an Geophysicist/field seismologistassistant throughout this case, was essential for help with positioning of the patient, positioning of the extremity, tissue manipulation, implantation of the prosthesis, wound closure, and intraoperative decision making.     Vania ReaKevin M. Mieczyslaw Stamas, M.D.     KMS/MEDQ  D:  10/12/2016  T:  10/12/2016   Job:  [id number]

## 2016-10-12 NOTE — Anesthesia Postprocedure Evaluation (Signed)
Anesthesia Post Note  Patient: Herbert SetaRebecca V Acklin  Procedure(s) Performed: Procedure(s) (LRB): REVERSE LEFT SHOULDER ARTHROPLASTY (Left)     Patient location during evaluation: PACU Anesthesia Type: General Level of consciousness: awake and alert Pain management: pain level controlled Vital Signs Assessment: post-procedure vital signs reviewed and stable Respiratory status: spontaneous breathing, nonlabored ventilation, respiratory function stable and patient connected to nasal cannula oxygen Cardiovascular status: blood pressure returned to baseline and stable Postop Assessment: no signs of nausea or vomiting Anesthetic complications: no    Last Vitals:  Vitals:   10/12/16 1010 10/12/16 1026  BP: (!) 149/71 138/72  Pulse: 68 72  Resp: (!) 29 20  Temp:  36.7 C    Last Pain:  Vitals:   10/12/16 1026  TempSrc:   PainSc: 10-Worst pain ever                 Khaleef Ruby

## 2016-10-12 NOTE — Anesthesia Procedure Notes (Signed)
Anesthesia Regional Block: Interscalene brachial plexus block   Pre-Anesthetic Checklist: ,, timeout performed, Correct Patient, Correct Site, Correct Laterality, Correct Procedure, Correct Position, site marked, Risks and benefits discussed,  Surgical consent,  Pre-op evaluation,  At surgeon's request and post-op pain management  Laterality: Left  Prep: chloraprep       Needles:  Injection technique: Single-shot  Needle Type: Echogenic Needle     Needle Length: 5cm  Needle Gauge: 21     Additional Needles:   Procedures: ultrasound guided,,,,,,,,   Nerve Stimulator or Paresthesia:  Response: shoulder, 0.4 mA,   Additional Responses:   Narrative:  Start time: 10/12/2016 6:59 AM End time: 10/12/2016 7:09 AM Injection made incrementally with aspirations every 5 mL.  Performed by: Personally  Anesthesiologist: Wadie Liew

## 2016-10-12 NOTE — Anesthesia Procedure Notes (Signed)
Procedure Name: Intubation Date/Time: 10/12/2016 7:32 AM Performed by: Mervyn Gay Pre-anesthesia Checklist: Patient identified, Patient being monitored, Timeout performed, Emergency Drugs available and Suction available Patient Re-evaluated:Patient Re-evaluated prior to induction Oxygen Delivery Method: Circle System Utilized Preoxygenation: Pre-oxygenation with 100% oxygen Induction Type: IV induction Ventilation: Mask ventilation without difficulty Laryngoscope Size: 3 and Mac Grade View: Grade I Tube type: Oral Tube size: 7.0 mm Number of attempts: 1 Airway Equipment and Method: Stylet Placement Confirmation: ETT inserted through vocal cords under direct vision,  positive ETCO2 and breath sounds checked- equal and bilateral Secured at: 21 cm Tube secured with: Tape Dental Injury: Teeth and Oropharynx as per pre-operative assessment

## 2016-10-13 ENCOUNTER — Encounter (HOSPITAL_COMMUNITY): Payer: Self-pay | Admitting: Orthopedic Surgery

## 2016-10-13 MED ORDER — DIAZEPAM 5 MG PO TABS: 2.5000 mg | ORAL_TABLET | Freq: Four times a day (QID) | ORAL | 1 refills | Status: DC | PRN

## 2016-10-13 MED ORDER — ONDANSETRON HCL 4 MG PO TABS: 4.0000 mg | ORAL_TABLET | Freq: Three times a day (TID) | ORAL | 0 refills | Status: DC | PRN

## 2016-10-13 MED ORDER — OXYCODONE-ACETAMINOPHEN 5-325 MG PO TABS: 1.0000 | ORAL_TABLET | ORAL | 0 refills | Status: DC | PRN

## 2016-10-13 NOTE — Addendum Note (Signed)
Addendum  created 10/13/16 1249 by Bethena Midgetddono, Trudie Cervantes, MD   Sign clinical note

## 2016-10-13 NOTE — Evaluation (Signed)
Occupational Therapy Evaluation Patient Details Name: Tracey Walton MRN: [id number] DOB: 08-21-1940 Today's Date: 10/13/2016    History of Present Illness 76 yo female s/p Left rotator cuff tear arthropathy   Clinical Impression   Patient evaluated by Occupational Therapy with no further acute OT needs identified. All education has been completed and the patient has no further questions. See below for any follow-up Occupational Therapy or equipment needs. OT to sign off. Thank you for referral.      Follow Up Recommendations  Home health OT;Other (comment) (home safety)    Equipment Recommendations  None recommended by OT    Recommendations for Other Services       Precautions / Restrictions Precautions Precautions: Shoulder Type of Shoulder Precautions: ACTIVE Shoulder Interventions: Off for dressing/bathing/exercises Restrictions Weight Bearing Restrictions: Yes LUE Weight Bearing: Non weight bearing      Mobility Bed Mobility               General bed mobility comments: in chair on arrival. sleeps in chair at home  Transfers Overall transfer level: Needs assistance   Transfers: Sit to/from Stand Sit to Stand: Supervision              Balance Overall balance assessment: Needs assistance Sitting-balance support: No upper extremity supported;Feet supported Sitting balance-Leahy Scale: Good     Standing balance support: No upper extremity supported;During functional activity Standing balance-Leahy Scale: Good                             ADL either performed or assessed with clinical judgement   ADL Overall ADL's : Needs assistance/impaired Eating/Feeding: Set up   Grooming: Set up           Upper Body Dressing : Minimal assistance   Lower Body Dressing: Minimal assistance   Toilet Transfer: Supervision/safety           Functional mobility during ADLs: Supervision/safety General ADL Comments: pt with daughter  present for all education  Pt educated on bathing and avoid washing directly on incision. Pt educated to use new wash cloth and towel each day. Pt educated to allow water to run across dressing and not to soak in a tub at this time.       Vision   Vision Assessment?: No apparent visual deficits     Perception     Praxis      Pertinent Vitals/Pain Pain Assessment: Faces Faces Pain Scale: Hurts little more Pain Location: L shoulder Pain Descriptors / Indicators: Operative site guarding;Grimacing Pain Intervention(s): Monitored during session;Premedicated before session;Repositioned     Hand Dominance Right   Extremity/Trunk Assessment Upper Extremity Assessment Upper Extremity Assessment: LUE deficits/detail;RUE deficits/detail RUE Deficits / Details: shoulder ff limited to 90 with preexisting deficits  LUE Deficits / Details: s/p surg   Lower Extremity Assessment Lower Extremity Assessment: Overall WFL for tasks assessed   Cervical / Trunk Assessment Cervical / Trunk Assessment: Normal   Communication Communication Communication: No difficulties   Cognition Arousal/Alertness: Awake/alert Behavior During Therapy: WFL for tasks assessed/performed Overall Cognitive Status: Within Functional Limits for tasks assessed                                     General Comments  dressing on L shoulder post ob    Exercises Exercises: Shoulder Shoulder Exercises Shoulder Flexion: AROM;Left;10 reps (80) Shoulder  ABduction: AROM;Left;10 reps;Seated (60)   Shoulder Instructions Shoulder Instructions Donning/doffing shirt without moving shoulder: Set-up Method for sponge bathing under operated UE: Set-up Donning/doffing sling/immobilizer: Set-up Correct positioning of sling/immobilizer: Set-up ROM for elbow, wrist and digits of operated UE: Independent Sling wearing schedule (on at all times/off for ADL's): Set-up Positioning of UE while sleeping: Set-up     Home Living Family/patient expects to be discharged to:: Private residence Living Arrangements: Children Available Help at Discharge: Family;Available 24 hours/day Type of Home: House                       Home Equipment: Cane - single point          Prior Functioning/Environment Level of Independence: Independent with assistive device(s)                 OT Problem List:        OT Treatment/Interventions:      OT Goals(Current goals can be found in the care plan section) Acute Rehab OT Goals Patient Stated Goal: to go home today  OT Frequency:     Barriers to D/C:            Co-evaluation              AM-PAC PT "6 Clicks" Daily Activity     Outcome Measure Help from another person eating meals?: A Little Help from another person taking care of personal grooming?: A Little Help from another person toileting, which includes using toliet, bedpan, or urinal?: A Little Help from another person bathing (including washing, rinsing, drying)?: A Little Help from another person to put on and taking off regular upper body clothing?: A Little Help from another person to put on and taking off regular lower body clothing?: A Little 6 Click Score: 18   End of Session Equipment Utilized During Treatment: Other (comment) (sling) Nurse Communication: Mobility status;Precautions  Activity Tolerance: Patient tolerated treatment well Patient left: in chair;with call bell/phone within reach;with family/visitor present  OT Visit Diagnosis: Unsteadiness on feet (R26.81)                Time: 1610-9604 OT Time Calculation (min): 35 min Charges:  OT General Charges $OT Visit: 1 Procedure OT Evaluation $OT Eval Moderate Complexity: 1 Procedure OT Treatments $Self Care/Home Management : 8-22 mins G-Codes:      Mateo Flow   OTR/L Pager: [phone] Office: [phone] .   Boone Master B 10/13/2016, 9:46 AM

## 2016-10-13 NOTE — Progress Notes (Signed)
PT Cancellation Note  Patient Details Name: Tracey SetaRebecca V Alldredge MRN: [id number] DOB: 1940/11/21   Cancelled Treatment:    Reason Eval/Treat Not Completed: Patient declined, no reason specified. Has been having balance issues but is on a cane and declined intervention.  Invited her to ask nsg if she changes her mind, but family feels she is baseline.   Ivar DrapeRuth E Rahshawn Remo 10/13/2016, 10:13 AM   Samul Dadauth Luke Rigsbee, PT MS Acute Rehab Dept. Number: [facility] [id number] and [facility] [phone]

## 2016-10-13 NOTE — Care Management Note (Signed)
Case Management Note  Patient Details  Name: Tracey Walton MRN: [id number] Date of Birth: 02/17/1941  Subjective/Objective:   76 yr old female s/p left reverse total shoulder arthroplasty.                 Action/Plan: Case manager spoke with patient and her daughter concerning discharge plan. Choice for home Health was offered. Referral was called to Janeice RobinsonKaren Nusbaumm, [facility] Liaison. Patient will have family support at discharge.    Expected Discharge Date:  10/13/16               Expected Discharge Plan:  Home w Home Health Services  In-House Referral:  NA  Discharge planning Services  CM Consult  Post Acute Care Choice:  Home Health Choice offered to:  Patient, Adult Children  DME Arranged:  N/A DME Agency:  NA  HH Arranged:  OT, PT HH Agency:  [facility]  Status of Service:  Completed, signed off  If discussed at Long Length of Stay Meetings, dates discussed:    Additional Comments:  Durenda GuthrieBrady, Merl Bommarito Naomi, RN 10/13/2016, 10:57 AM

## 2016-10-13 NOTE — Discharge Summary (Signed)
PATIENT ID:      Tracey Walton  MRN:     [id number] DOB/AGE:    10-16-1940 / 76 y.o.     DISCHARGE SUMMARY  ADMISSION DATE:    10/12/2016 DISCHARGE DATE:    ADMISSION DIAGNOSIS: Left rotator cuff tear arthropathy Past Medical History:  Diagnosis Date  . Arthritis   . Chest pain 2011   negative for MI  . Chronic kidney disease    renal insufficiency. 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 Depression   . Dyspnea    SOB while walking  . GERD (gastroesophageal reflux disease)   . Heart murmur   . History of hiatal hernia   . Hyperlipidemia   . Hypertension   . Pneumonia   . Polymyalgia (HCC)   . Strep throat     DISCHARGE DIAGNOSIS:   Active Problems:   S/p reverse total shoulder arthroplasty   PROCEDURE: Procedure(s): REVERSE LEFT SHOULDER ARTHROPLASTY on 10/12/2016  CONSULTS:    HISTORY:  See H&P in chart.  HOSPITAL COURSE:  Tracey SetaRebecca V Cartier is a 76 y.o. admitted on 10/12/2016 with a diagnosis of Left rotator cuff tear arthropathy.  They were brought to the operating room on 10/12/2016 and underwent Procedure(s): REVERSE LEFT SHOULDER ARTHROPLASTY.    They were given perioperative antibiotics: Anti-infectives    Start     Dose/Rate Route Frequency Ordered Stop   10/12/16 1200  ceFAZolin (ANCEF) IVPB 2g/100 mL premix     2 g 200 mL/hr over 30 Minutes Intravenous Every 6 hours 10/12/16 1026 10/13/16 0118   10/12/16 0545  ceFAZolin (ANCEF) IVPB 2g/100 mL premix  Status:  Discontinued     2 g 200 mL/hr over 30 Minutes Intravenous On call to O.R. 10/12/16 0545 10/12/16 1019    .  Patient underwent the above named procedure and tolerated it well. The following day they were hemodynamically stable and pain was controlled on oral analgesics. They were neurovascularly intact to the operative extremity. OT was ordered and worked with patient per protocol. They were medically and orthopaedically stable for discharge on day 1. Home health was  arranged   DIAGNOSTIC STUDIES:  RECENT RADIOGRAPHIC STUDIES :  No results found.  RECENT VITAL SIGNS:  Patient Vitals for the past 24 hrs:  BP Temp Temp src Pulse Resp SpO2  10/13/16 0527 124/65 97.9 F (36.6 C) Oral 71 18 92 %  10/13/16 0109 132/60 98 F (36.7 C) Oral 70 16 95 %  10/12/16 2052 125/60 - - 73 16 96 %  10/12/16 1026 138/72 98.1 F (36.7 C) - 72 20 97 %  10/12/16 1010 (!) 149/71 - - 68 (!) 29 96 %  10/12/16 0950 - (!) 97.4 F (36.3 C) - 67 18 97 %  10/12/16 0940 134/74 - - 62 18 98 %  10/12/16 0925 (!) 143/72 - - 69 20 98 %  10/12/16 0908 134/82 (!) 97.2 F (36.2 C) - 70 20 96 %  .  RECENT EKG RESULTS:    Orders placed or performed during the hospital encounter of 10/09/16  . EKG 12 lead  . EKG 12 lead    DISCHARGE INSTRUCTIONS:    DISCHARGE MEDICATIONS:   Allergies as of 10/13/2016      Reactions   Penicillins Rash   Pt tolerated 2g ancef without issue 10/12/16      Medication List    TAKE these medications   amLODipine 2.5 MG tablet Commonly known as:  NORVASC TAKE 1 TABLET BY MOUTH  EVERY EVENING What changed:  See the new instructions.   atorvastatin 40 MG tablet Commonly known as:  LIPITOR TAKE 1 TABLET BY MOUTH  DAILY AT 6 PM. What changed:  how much to take  how to take this  when to take this  additional instructions   calcium-vitamin D 500-200 MG-UNIT tablet Commonly known as:  OSCAL WITH D Take 1 tablet by mouth daily.   carvedilol 6.25 MG tablet Commonly known as:  COREG Take 1 tablet (6.25 mg total) by mouth 2 (two) times daily.   cetirizine 10 MG tablet Commonly known as:  ZYRTEC Take 10 mg by mouth daily.   diazepam 5 MG tablet Commonly known as:  VALIUM Take 0.5-1 tablets (2.5-5 mg total) by mouth every 6 (six) hours as needed for muscle spasms or sedation.   fish oil-omega-3 fatty acids 1000 MG capsule Take 1,200 mg by mouth daily.   FLUoxetine 10 MG capsule Commonly known as:  PROZAC Take 10 mg by mouth  daily.   hydrochlorothiazide 25 MG tablet Commonly known as:  HYDRODIURIL Take one-half tablet by  mouth daily What changed:  how much to take  how to take this  when to take this  additional instructions   Magnesium 250 MG Tabs Take 250 mg by mouth daily.   naproxen sodium 220 MG tablet Commonly known as:  ANAPROX Take 220 mg by mouth daily as needed (for pain).   omeprazole 20 MG capsule Commonly known as:  PRILOSEC TAKE 1 CAPSULE BY MOUTH  DAILY What changed:  See the new instructions.   ondansetron 4 MG tablet Commonly known as:  ZOFRAN Take 1 tablet (4 mg total) by mouth every 8 (eight) hours as needed for nausea or vomiting.   oxyCODONE-acetaminophen 5-325 MG tablet Commonly known as:  PERCOCET Take 1-2 tablets by mouth every 4 (four) hours as needed.   traMADol 50 MG tablet Commonly known as:  ULTRAM Take 50 mg by mouth 4 (four) times daily.       FOLLOW UP VISIT:   Follow-up Information    Francena Hanly, MD.   Specialty:  Orthopedic Surgery Why:  call to be seen in 10 -14 days Contact information: [address] [place] [address] [phone]           DISCHARGE ON:GEXB DISPOSITION: Good  DISCHARGE CONDITION:  Tracey Walton for Dr. Francena Hanly 10/13/2016, 8:12 AM

## 2016-12-09 LAB — BASIC METABOLIC PANEL
BUN: 27 — AB (ref 4–21)
Creatinine: 1 (ref <=1.1)
Glucose: 87
Potassium: 4.5 (ref 3.4–5.3)
Sodium: 139 (ref 137–147)

## 2016-12-09 LAB — LIPID PANEL
Cholesterol: 184 (ref 0–200)
HDL: 44 (ref 35–70)
LDL Cholesterol: 117
Triglycerides: 116 (ref 40–160)

## 2016-12-09 LAB — VITAMIN D 25 HYDROXY (VIT D DEFICIENCY, FRACTURES): Vit D, 25-Hydroxy: 34.3

## 2016-12-09 LAB — CBC AND DIFFERENTIAL
HCT: 40 (ref 36–46)
Hemoglobin: 12 (ref 12.0–16.0)
Platelets: 416 — AB (ref 150–399)
WBC: 5.2

## 2016-12-09 LAB — HEPATIC FUNCTION PANEL
ALT: 16 (ref 7–35)
AST: 21 (ref 13–35)
Alkaline Phosphatase: 111 (ref 25–125)
Bilirubin, Total: 0.5

## 2016-12-09 LAB — TSH: TSH: 0.87 (ref <=5.90)

## 2017-05-25 DIAGNOSIS — M12819 Other specific arthropathies, not elsewhere classified, unspecified shoulder: Secondary | ICD-10-CM | POA: Insufficient documentation

## 2017-05-25 DIAGNOSIS — M25511 Pain in right shoulder: Secondary | ICD-10-CM | POA: Insufficient documentation

## 2017-05-25 DIAGNOSIS — M75100 Unspecified rotator cuff tear or rupture of unspecified shoulder, not specified as traumatic: Secondary | ICD-10-CM | POA: Insufficient documentation

## 2017-06-17 LAB — HEPATIC FUNCTION PANEL
ALT: 12 (ref 7–35)
AST: 17 (ref 13–35)
Alkaline Phosphatase: 107 (ref 25–125)
Bilirubin, Total: 0.4

## 2017-06-17 LAB — BASIC METABOLIC PANEL
BUN: 28 — AB (ref 4–21)
Creatinine: 1 (ref <=1.1)
Glucose: 104
Potassium: 4.8 (ref 3.4–5.3)
Sodium: 142 (ref 137–147)

## 2017-06-17 LAB — LIPID PANEL
Cholesterol: 180 (ref 0–200)
HDL: 47 (ref 35–70)
LDL Cholesterol: 121
Triglycerides: 62 (ref 40–160)

## 2017-06-17 LAB — VITAMIN D 25 HYDROXY (VIT D DEFICIENCY, FRACTURES): Vit D, 25-Hydroxy: 33.2

## 2017-07-25 DIAGNOSIS — Z79891 Long term (current) use of opiate analgesic: Secondary | ICD-10-CM | POA: Insufficient documentation

## 2017-07-26 NOTE — Pre-Procedure Instructions (Signed)
Tracey SetaRebecca V Walton  07/26/2017      [facility] - [place] - [address] [place] [address] [address] [place] [id number] Phone: [phone] Fax: [phone]  [facility] [place] - [id number] [place] [address] [place] [id number] Phone: [phone] Fax: [phone]    Your procedure is scheduled on May 9  [facility] at 0530 A.M.  Call this number if you have problems the morning of surgery:  [phone]   Remember:  Do not eat food or drink liquids after midnight.  Continue all medications as directed by your physician except follow these medication instructions before surgery below   Take these medicines the morning of surgery with A SIP OF WATER  acetaminophen (TYLENOL)  carvedilol (COREG) cetirizine (ZYRTEC) FLUoxetine (PROZAC)  fluticasone (FLONASE)  omeprazole (PRILOSEC) traMADol (ULTRAM)  7 days prior to surgery STOP taking any Aspirin(unless otherwise instructed by your surgeon), Aleve, Naproxen, Ibuprofen, Motrin, Advil, Goody's, BC's, all herbal medications, fish oil, and all vitamins    Do not wear jewelry, make-up or nail polish.  Do not wear lotions, powders, or perfumes, or deodorant.  Do not shave 48 hours prior to surgery.    Do not bring valuables to the hospital.  [facility] is not responsible for any belongings or valuables.  Contacts, dentures or bridgework may not be worn into surgery.  Leave your suitcase in the car.  After surgery it may be brought to your room.  For patients admitted to the hospital, discharge time will be determined by your treatment team.  Patients discharged the day of surgery will not be allowed to drive home.    Special instructions:   [place]- Preparing For Surgery  Before surgery, you can play an important role. Because skin is not sterile, your skin needs to be as free of germs as possible.  You can reduce the number of germs on your skin by washing with CHG (chlorahexidine gluconate) Soap before surgery.  CHG is an antiseptic cleaner which kills germs and bonds with the skin to continue killing germs even after washing.  Please do not use if you have an allergy to CHG or antibacterial soaps. If your skin becomes reddened/irritated stop using the CHG.  Do not shave (including legs and underarms) for at least 48 hours prior to first CHG shower. It is OK to shave your face.  Please follow these instructions carefully.   1. Shower the NIGHT BEFORE SURGERY and the MORNING OF SURGERY with CHG.   2. If you chose to wash your hair, wash your hair first as usual with your normal shampoo.  3. After you shampoo, rinse your hair and body thoroughly to remove the shampoo.  4. Use CHG as you would any other liquid soap. You can apply CHG directly to the skin and wash gently with a scrungie or a clean washcloth.   5. Apply the CHG Soap to your body ONLY FROM THE NECK DOWN.  Do not use on open wounds or open sores. Avoid contact with your eyes, ears, mouth and genitals (private parts). Wash Face and genitals (private parts)  with your normal soap.  6. Wash thoroughly, paying special attention to the area where your surgery will be performed.  7. Thoroughly rinse your body with warm water from the neck down.  8. DO NOT shower/wash with your normal soap after using and rinsing off  the CHG Soap.  9. Pat yourself dry with a CLEAN TOWEL.  10. Wear CLEAN PAJAMAS to bed the night before surgery, wear comfortable clothes the morning of surgery  11. Place CLEAN SHEETS on your bed the night of your first shower and DO NOT SLEEP WITH PETS.    Day of Surgery: Do not apply any deodorants/lotions. Please wear clean clothes to the hospital/surgery center.      Please read over the following fact sheets that you were given.

## 2017-07-27 ENCOUNTER — Encounter (HOSPITAL_COMMUNITY): Payer: Self-pay

## 2017-07-27 ENCOUNTER — Encounter (HOSPITAL_COMMUNITY)
Admission: RE | Admit: 2017-07-27 | Discharge: 2017-07-27 | Disposition: A | Discharge status: Home / Self Care | Payer: Medicare Other | Source: Ambulatory Visit | Attending: Orthopedic Surgery | Admitting: Orthopedic Surgery

## 2017-07-27 ENCOUNTER — Other Ambulatory Visit: Payer: Self-pay

## 2017-07-27 DIAGNOSIS — E785 Hyperlipidemia, unspecified: Secondary | ICD-10-CM | POA: Insufficient documentation

## 2017-07-27 DIAGNOSIS — Z79899 Other long term (current) drug therapy: Secondary | ICD-10-CM | POA: Diagnosis not present

## 2017-07-27 DIAGNOSIS — Z90710 Acquired absence of both cervix and uterus: Secondary | ICD-10-CM | POA: Insufficient documentation

## 2017-07-27 DIAGNOSIS — M75101 Unspecified rotator cuff tear or rupture of right shoulder, not specified as traumatic: Secondary | ICD-10-CM | POA: Insufficient documentation

## 2017-07-27 DIAGNOSIS — M353 Polymyalgia rheumatica: Secondary | ICD-10-CM | POA: Diagnosis not present

## 2017-07-27 DIAGNOSIS — F329 Major depressive disorder, single episode, unspecified: Secondary | ICD-10-CM | POA: Insufficient documentation

## 2017-07-27 DIAGNOSIS — N289 Disorder of kidney and ureter, unspecified: Secondary | ICD-10-CM | POA: Diagnosis not present

## 2017-07-27 DIAGNOSIS — Z01812 Encounter for preprocedural laboratory examination: Secondary | ICD-10-CM | POA: Insufficient documentation

## 2017-07-27 DIAGNOSIS — Z96612 Presence of left artificial shoulder joint: Secondary | ICD-10-CM | POA: Diagnosis not present

## 2017-07-27 DIAGNOSIS — I10 Essential (primary) hypertension: Secondary | ICD-10-CM | POA: Insufficient documentation

## 2017-07-27 DIAGNOSIS — K219 Gastro-esophageal reflux disease without esophagitis: Secondary | ICD-10-CM | POA: Diagnosis not present

## 2017-07-27 DIAGNOSIS — Z01818 Encounter for other preprocedural examination: Secondary | ICD-10-CM | POA: Insufficient documentation

## 2017-07-27 DIAGNOSIS — Z7951 Long term (current) use of inhaled steroids: Secondary | ICD-10-CM | POA: Diagnosis not present

## 2017-07-27 LAB — CBC
HCT: 39.6 % (ref 36.0–46.0)
Hemoglobin: 12.7 g/dL (ref 12.0–15.0)
MCH: 28.6 pg (ref 26.0–34.0)
MCHC: 32.1 g/dL (ref 30.0–36.0)
MCV: 89.2 fL (ref 78.0–100.0)
Platelets: 421 10*3/uL — ABNORMAL HIGH (ref 150–400)
RBC: 4.44 MIL/uL (ref 3.87–5.11)
RDW: 15.5 % (ref 11.5–15.5)
WBC: 5.3 10*3/uL (ref 4.0–10.5)

## 2017-07-27 LAB — SURGICAL PCR SCREEN
MRSA, PCR: NEGATIVE
Staphylococcus aureus: NEGATIVE

## 2017-07-27 LAB — BASIC METABOLIC PANEL
Anion gap: 12 (ref 5–15)
BUN: 34 mg/dL — ABNORMAL HIGH (ref 6–20)
CO2: 23 mmol/L (ref 22–32)
Calcium: 9.2 mg/dL (ref 8.9–10.3)
Chloride: 102 mmol/L (ref 101–111)
Creatinine, Ser: 0.98 mg/dL (ref 0.44–1.00)
GFR calc Af Amer: >60 mL/min (ref >60)
GFR calc non Af Amer: 55 mL/min — ABNORMAL LOW (ref >60)
Glucose, Bld: 88 mg/dL (ref 65–99)
Potassium: 4.3 mmol/L (ref 3.5–5.1)
Sodium: 137 mmol/L (ref 135–145)

## 2017-07-27 NOTE — Progress Notes (Signed)
PCP - doenst have one Cardiologist - Nasher  Chest x-ray - not needed EKG - 10/09/16 Stress Test -2016  ECHO - 2016 Cardiac Cathdenies -    Anesthesia review: Yes Angela note 10/09/16  Patient denies shortness of breath, fever, cough and chest pain at PAT appointment   Patient verbalized understanding of instructions that were given to them at the PAT appointment. Patient was also instructed that they will need to review over the PAT instructions again at home before surgery.

## 2017-07-30 NOTE — Progress Notes (Signed)
Anesthesia Chart Review:   Case:  [id number] Date/Time:  08/09/17 0715   Procedure:  RIGHT REVERSE SHOULDER ARTHROPLASTY (Right Shoulder)   Anesthesia type:  General   Pre-op diagnosis:  Right Shoulder rotator cuff tear arthropathy   Location:  [facility] ROOM 06 / [facility]   Surgeon:  Francena Hanly, MD      DISCUSSION:  Pt is a 77 year old female with "very mild" aortic stenosis by 2016 echo. Has not seen cardiology since 10/01/15. No documentation of murmur on exam during office visit 06/17/17 with Gennette Pac, NP (notes in care everywhere). Pt tolerated L shoulder arthroplasty 10/12/16 without issue.    VS: BP (!) 170/70   Pulse 74   Temp 36.8 C   Resp 20   Ht 5' 4.5" (1.638 m)   Wt 230 lb (104.3 kg)   SpO2 95%   BMI 38.87 kg/m   PROVIDERS: - No PCP documented. Receives routine care at [facility] - [facility] (notes in care everywhere)  - Cardiologist is Kristeen Miss, MD. last office visit 10/01/15   LABS: Labs reviewed: Acceptable for surgery. (all labs ordered are listed, but only abnormal results are displayed)  Labs Reviewed  CBC - Abnormal; Notable for the following components:      Result Value   Platelets 421 (*)    All other components within normal limits  BASIC METABOLIC PANEL - Abnormal; Notable for the following components:   BUN 34 (*)    GFR calc non Af Amer 55 (*)    All other components within normal limits  SURGICAL PCR SCREEN     IMAGES: No results found.  EKG 10/09/16: Sinus rhythm with short PR. Cannot rule out Inferior infarct, age undetermined. Appears stable compared with EKG 05/29/14.    CV:  Nuclear stress test 06/25/14:  - Normal stress nuclear study.Overall low risk study with no areas of significant ischemia identified. Sensitivity and specificity of study are reduced by breast attenuation as well as bowel loop attenuation artifact. - LV Ejection Fraction: 54%. LV Wall Motion: There is hypokinesis of  the base to mid inferior wall. This may be artifactual however given attenuation.   Echo 06/19/14: - Left ventricle: The cavity size was normal. Systolic function was normal. The estimated ejection fraction was in the range of 55%to 60%. Wall motion was normal; there were no regional wallmotion abnormalities. Doppler parameters are consistent with abnormal left ventricular relaxation (grade 1 diastolic dysfunction). - Aortic valve: Cusp separation was reduced. There was very mildstenosis. There was trivial regurgitation. Mean gradient (S): 11mm Hg. - Mitral valve: There was mild regurgitation.    Past Medical History:  Diagnosis Date  . Arthritis   . Chest pain 2011   negative for MI  . Chronic kidney disease    renal insufficiency. 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 Depression   . Dyspnea    SOB while walking  . GERD (gastroesophageal reflux disease)   . Heart murmur   . History of hiatal hernia   . Hyperlipidemia   . Hypertension   . Pneumonia   . Polymyalgia (HCC)   . Strep throat     Past Surgical History:  Procedure Laterality Date  . carpal tunnel repair     left 2005 right 2003 surgery  . EYE SURGERY     cataracts  . KNEE SURGERY     Bilateral  . PARTIAL  HYSTERECTOMY    . REVERSE SHOULDER ARTHROPLASTY Left 10/12/2016  . REVERSE SHOULDER ARTHROPLASTY Left 10/12/2016   Procedure: REVERSE LEFT SHOULDER ARTHROPLASTY;  Surgeon: Francena Hanly, MD;  Location: [facility] OR;  Service: Orthopedics;  Laterality: Left;  . right shoulder arthroscopy  2003  . TOOTH EXTRACTION      MEDICATIONS: . acetaminophen (TYLENOL) 325 MG tablet  . amLODipine (NORVASC) 2.5 MG tablet  . atorvastatin (LIPITOR) 40 MG tablet  . carvedilol (COREG) 6.25 MG tablet  . cetirizine (ZYRTEC) 10 MG tablet  . FLUoxetine (PROZAC) 10 MG capsule  . FLUoxetine (PROZAC) 20 MG capsule  . fluticasone (FLONASE) 50 MCG/ACT nasal spray  . hydrochlorothiazide (HYDRODIURIL) 25  MG tablet  . omeprazole (PRILOSEC) 20 MG capsule  . Polyethyl Glycol-Propyl Glycol (LUBRICANT EYE DROPS) 0.4-0.3 % SOLN  . traMADol (ULTRAM) 50 MG tablet  . vitamin C (ASCORBIC ACID) 500 MG tablet   No current facility-administered medications for this encounter.     If no changes, I anticipate pt can proceed with surgery as scheduled.   Rica Mast, FNP-BC [facility]/Anesthesiology Phone: [phone] 07/30/2017 9:59 AM

## 2017-08-08 ENCOUNTER — Encounter (HOSPITAL_COMMUNITY): Payer: Self-pay | Admitting: Anesthesiology

## 2017-08-08 MED ORDER — SODIUM CHLORIDE 0.9 % IV SOLN
1000.0000 mg | INTRAVENOUS | Status: AC
  Administered 2017-08-09: 1000 mg via INTRAVENOUS
  Filled 2017-08-08: qty 1100

## 2017-08-08 NOTE — Anesthesia Preprocedure Evaluation (Addendum)
Anesthesia Evaluation  Patient identified by MRN, date of birth, ID band Patient awake    Reviewed: Allergy & Precautions, NPO status , Patient's Chart, lab work & pertinent test results  Airway Mallampati: II       Dental no notable dental hx. (+) Teeth Intact   Pulmonary    Pulmonary exam normal breath sounds clear to auscultation       Cardiovascular hypertension, Pt. on medications and Pt. on home beta blockers Normal cardiovascular exam Rhythm:Regular Rate:Normal     Neuro/Psych    GI/Hepatic Neg liver ROS, GERD  Medicated,  Endo/Other  Morbid obesity  Renal/GU      Musculoskeletal   Abdominal (+) + obese,   Peds  Hematology negative hematology ROS (+)   Anesthesia Other Findings   Reproductive/Obstetrics                            Anesthesia Physical Anesthesia Plan  ASA: III  Anesthesia Plan: General   Post-op Pain Management:  Regional for Post-op pain   Induction: Intravenous  PONV Risk Score and Plan: 3 and Ondansetron and Dexamethasone  Airway Management Planned: Oral ETT  Additional Equipment:   Intra-op Plan:   Post-operative Plan: Extubation in OR  Informed Consent: I have reviewed the patients History and Physical, chart, labs and discussed the procedure including the risks, benefits and alternatives for the proposed anesthesia with the patient or authorized representative who has indicated his/her understanding and acceptance.   Dental advisory given  Plan Discussed with: CRNA and Surgeon  Anesthesia Plan Comments:        Anesthesia Quick Evaluation

## 2017-08-09 ENCOUNTER — Inpatient Hospital Stay (HOSPITAL_COMMUNITY)
Admission: RE | Admit: 2017-08-09 | Discharge: 2017-08-10 | DRG: 483 | Disposition: A | Discharge status: Home / Self Care | Payer: Medicare Other | Attending: Orthopedic Surgery | Admitting: Orthopedic Surgery

## 2017-08-09 ENCOUNTER — Inpatient Hospital Stay (HOSPITAL_COMMUNITY): Payer: Medicare Other | Admitting: Emergency Medicine

## 2017-08-09 ENCOUNTER — Encounter (HOSPITAL_COMMUNITY)
Admission: RE | Disposition: A | Discharge status: Home / Self Care | Payer: Self-pay | Source: Home / Self Care | Attending: Orthopedic Surgery

## 2017-08-09 ENCOUNTER — Inpatient Hospital Stay (HOSPITAL_COMMUNITY): Payer: Medicare Other | Admitting: Anesthesiology

## 2017-08-09 ENCOUNTER — Other Ambulatory Visit: Payer: Self-pay

## 2017-08-09 ENCOUNTER — Encounter (HOSPITAL_COMMUNITY): Payer: Self-pay | Admitting: General Practice

## 2017-08-09 DIAGNOSIS — Z79899 Other long term (current) drug therapy: Secondary | ICD-10-CM | POA: Diagnosis not present

## 2017-08-09 DIAGNOSIS — Z8349 Family history of other endocrine, nutritional and metabolic diseases: Secondary | ICD-10-CM | POA: Diagnosis not present

## 2017-08-09 DIAGNOSIS — Z8701 Personal history of pneumonia (recurrent): Secondary | ICD-10-CM | POA: Diagnosis not present

## 2017-08-09 DIAGNOSIS — M1990 Unspecified osteoarthritis, unspecified site: Secondary | ICD-10-CM | POA: Diagnosis present

## 2017-08-09 DIAGNOSIS — E785 Hyperlipidemia, unspecified: Secondary | ICD-10-CM | POA: Diagnosis present

## 2017-08-09 DIAGNOSIS — F329 Major depressive disorder, single episode, unspecified: Secondary | ICD-10-CM | POA: Diagnosis present

## 2017-08-09 DIAGNOSIS — Z6838 Body mass index (BMI) 38.0-38.9, adult: Secondary | ICD-10-CM

## 2017-08-09 DIAGNOSIS — Z90711 Acquired absence of uterus with remaining cervical stump: Secondary | ICD-10-CM

## 2017-08-09 DIAGNOSIS — G8929 Other chronic pain: Secondary | ICD-10-CM | POA: Diagnosis present

## 2017-08-09 DIAGNOSIS — M353 Polymyalgia rheumatica: Secondary | ICD-10-CM | POA: Diagnosis present

## 2017-08-09 DIAGNOSIS — M19011 Primary osteoarthritis, right shoulder: Secondary | ICD-10-CM | POA: Diagnosis present

## 2017-08-09 DIAGNOSIS — Z96612 Presence of left artificial shoulder joint: Secondary | ICD-10-CM | POA: Diagnosis present

## 2017-08-09 DIAGNOSIS — M75101 Unspecified rotator cuff tear or rupture of right shoulder, not specified as traumatic: Principal | ICD-10-CM | POA: Diagnosis present

## 2017-08-09 DIAGNOSIS — F419 Anxiety disorder, unspecified: Secondary | ICD-10-CM | POA: Diagnosis present

## 2017-08-09 DIAGNOSIS — Z96619 Presence of unspecified artificial shoulder joint: Secondary | ICD-10-CM

## 2017-08-09 DIAGNOSIS — Z8249 Family history of ischemic heart disease and other diseases of the circulatory system: Secondary | ICD-10-CM

## 2017-08-09 DIAGNOSIS — K219 Gastro-esophageal reflux disease without esophagitis: Secondary | ICD-10-CM | POA: Diagnosis present

## 2017-08-09 DIAGNOSIS — M545 Low back pain: Secondary | ICD-10-CM | POA: Diagnosis present

## 2017-08-09 DIAGNOSIS — I10 Essential (primary) hypertension: Secondary | ICD-10-CM | POA: Diagnosis present

## 2017-08-09 DIAGNOSIS — M25711 Osteophyte, right shoulder: Secondary | ICD-10-CM | POA: Diagnosis present

## 2017-08-09 DIAGNOSIS — M25511 Pain in right shoulder: Secondary | ICD-10-CM | POA: Diagnosis present

## 2017-08-09 HISTORY — DX: Other chronic pain: G89.29

## 2017-08-09 HISTORY — DX: Low back pain, unspecified: M54.50

## 2017-08-09 HISTORY — DX: Low back pain: M54.5

## 2017-08-09 HISTORY — DX: Anxiety disorder, unspecified: F41.9

## 2017-08-09 HISTORY — PX: REVERSE SHOULDER ARTHROPLASTY: SHX5054

## 2017-08-09 SURGERY — ARTHROPLASTY, SHOULDER, TOTAL, REVERSE
Anesthesia: General | Site: Shoulder | Laterality: Right

## 2017-08-09 MED ORDER — 0.9 % SODIUM CHLORIDE (POUR BTL) OPTIME
TOPICAL | Status: DC | PRN
  Administered 2017-08-09: 1000 mL

## 2017-08-09 MED ORDER — FENTANYL CITRATE (PF) 250 MCG/5ML IJ SOLN
INTRAMUSCULAR | Status: DC | PRN
  Administered 2017-08-09 (×4): 50 ug via INTRAVENOUS

## 2017-08-09 MED ORDER — ONDANSETRON HCL 4 MG PO TABS: 4.0000 mg | ORAL_TABLET | Freq: Four times a day (QID) | ORAL | Status: DC | PRN

## 2017-08-09 MED ORDER — SUGAMMADEX SODIUM 200 MG/2ML IV SOLN
INTRAVENOUS | Status: AC
  Filled 2017-08-09: qty 2

## 2017-08-09 MED ORDER — LACTATED RINGERS IV SOLN
INTRAVENOUS | Status: DC
  Administered 2017-08-09: 16:00:00 via INTRAVENOUS

## 2017-08-09 MED ORDER — HYDROMORPHONE HCL 2 MG/ML IJ SOLN
0.3000 mg | INTRAMUSCULAR | Status: DC | PRN
  Administered 2017-08-09: 0.3 mg via INTRAVENOUS

## 2017-08-09 MED ORDER — DOCUSATE SODIUM 100 MG PO CAPS
100.0000 mg | ORAL_CAPSULE | Freq: Two times a day (BID) | ORAL | Status: DC
  Administered 2017-08-09 – 2017-08-10 (×2): 100 mg via ORAL
  Filled 2017-08-09 (×2): qty 1

## 2017-08-09 MED ORDER — ONDANSETRON HCL 4 MG/2ML IJ SOLN: 4.0000 mg | Freq: Four times a day (QID) | INTRAMUSCULAR | Status: DC | PRN

## 2017-08-09 MED ORDER — KETOROLAC TROMETHAMINE 30 MG/ML IJ SOLN
INTRAMUSCULAR | Status: AC
  Filled 2017-08-09: qty 1

## 2017-08-09 MED ORDER — FLUOXETINE HCL 20 MG PO CAPS
30.0000 mg | ORAL_CAPSULE | Freq: Every day | ORAL | Status: DC
  Filled 2017-08-09 (×2): qty 1

## 2017-08-09 MED ORDER — MAGNESIUM CITRATE PO SOLN: 1.0000 | Freq: Once | ORAL | Status: DC | PRN

## 2017-08-09 MED ORDER — PHENOL 1.4 % MT LIQD: 1.0000 | OROMUCOSAL | Status: DC | PRN

## 2017-08-09 MED ORDER — MIDAZOLAM HCL 5 MG/5ML IJ SOLN
INTRAMUSCULAR | Status: DC | PRN
  Administered 2017-08-09 (×2): 1 mg via INTRAVENOUS

## 2017-08-09 MED ORDER — PROPOFOL 10 MG/ML IV BOLUS
INTRAVENOUS | Status: DC | PRN
  Administered 2017-08-09: 100 mg via INTRAVENOUS

## 2017-08-09 MED ORDER — PROPOFOL 10 MG/ML IV BOLUS
INTRAVENOUS | Status: AC
  Filled 2017-08-09: qty 20

## 2017-08-09 MED ORDER — TRAMADOL HCL 50 MG PO TABS
50.0000 mg | ORAL_TABLET | Freq: Four times a day (QID) | ORAL | Status: DC | PRN
  Administered 2017-08-09 – 2017-08-10 (×2): 50 mg via ORAL
  Filled 2017-08-09 (×2): qty 1

## 2017-08-09 MED ORDER — POLYETHYLENE GLYCOL 3350 17 G PO PACK: 17.0000 g | PACK | Freq: Every day | ORAL | Status: DC | PRN

## 2017-08-09 MED ORDER — ALUM & MAG HYDROXIDE-SIMETH 200-200-20 MG/5ML PO SUSP: 30.0000 mL | ORAL | Status: DC | PRN

## 2017-08-09 MED ORDER — CEFAZOLIN SODIUM-DEXTROSE 2-4 GM/100ML-% IV SOLN
INTRAVENOUS | Status: AC
  Filled 2017-08-09: qty 100

## 2017-08-09 MED ORDER — AMLODIPINE BESYLATE 2.5 MG PO TABS
2.5000 mg | ORAL_TABLET | Freq: Every evening | ORAL | Status: DC
  Administered 2017-08-09: 2.5 mg via ORAL
  Filled 2017-08-09: qty 1

## 2017-08-09 MED ORDER — ROCURONIUM BROMIDE 50 MG/5ML IV SOLN
INTRAVENOUS | Status: AC
  Filled 2017-08-09: qty 1

## 2017-08-09 MED ORDER — KETOROLAC TROMETHAMINE 15 MG/ML IJ SOLN
7.5000 mg | Freq: Four times a day (QID) | INTRAMUSCULAR | Status: DC
  Administered 2017-08-09 – 2017-08-10 (×3): 7.5 mg via INTRAVENOUS
  Filled 2017-08-09 (×3): qty 1

## 2017-08-09 MED ORDER — BUPIVACAINE-EPINEPHRINE (PF) 0.5% -1:200000 IJ SOLN
INTRAMUSCULAR | Status: DC | PRN
  Administered 2017-08-09 (×5): 3 mL via PERINEURAL

## 2017-08-09 MED ORDER — KETOROLAC TROMETHAMINE 30 MG/ML IJ SOLN
30.0000 mg | Freq: Once | INTRAMUSCULAR | Status: AC | PRN
  Administered 2017-08-09: 30 mg via INTRAVENOUS

## 2017-08-09 MED ORDER — CARVEDILOL 6.25 MG PO TABS
6.2500 mg | ORAL_TABLET | Freq: Two times a day (BID) | ORAL | Status: DC
  Administered 2017-08-09 – 2017-08-10 (×2): 6.25 mg via ORAL
  Filled 2017-08-09 (×2): qty 1

## 2017-08-09 MED ORDER — METOCLOPRAMIDE HCL 5 MG/ML IJ SOLN: 5.0000 mg | Freq: Three times a day (TID) | INTRAMUSCULAR | Status: DC | PRN

## 2017-08-09 MED ORDER — ROCURONIUM BROMIDE 10 MG/ML (PF) SYRINGE
PREFILLED_SYRINGE | INTRAVENOUS | Status: DC | PRN
  Administered 2017-08-09: 20 mg via INTRAVENOUS
  Administered 2017-08-09: 30 mg via INTRAVENOUS

## 2017-08-09 MED ORDER — BUPIVACAINE LIPOSOME 1.3 % IJ SUSP
INTRAMUSCULAR | Status: DC | PRN
  Administered 2017-08-09 (×5): 2 mL via PERINEURAL

## 2017-08-09 MED ORDER — FENTANYL CITRATE (PF) 250 MCG/5ML IJ SOLN
INTRAMUSCULAR | Status: AC
  Filled 2017-08-09: qty 5

## 2017-08-09 MED ORDER — ACETAMINOPHEN 325 MG PO TABS
325.0000 mg | ORAL_TABLET | Freq: Four times a day (QID) | ORAL | Status: DC | PRN
  Administered 2017-08-09: 325 mg via ORAL
  Filled 2017-08-09: qty 1

## 2017-08-09 MED ORDER — OXYCODONE HCL 5 MG PO TABS
5.0000 mg | ORAL_TABLET | ORAL | Status: DC | PRN
  Administered 2017-08-09 – 2017-08-10 (×2): 10 mg via ORAL
  Filled 2017-08-09 (×2): qty 2

## 2017-08-09 MED ORDER — ATORVASTATIN CALCIUM 40 MG PO TABS
40.0000 mg | ORAL_TABLET | Freq: Every day | ORAL | Status: DC
  Administered 2017-08-09: 40 mg via ORAL
  Filled 2017-08-09: qty 1

## 2017-08-09 MED ORDER — PHENYLEPHRINE HCL 10 MG/ML IJ SOLN
INTRAVENOUS | Status: DC | PRN
  Administered 2017-08-09: 80 ug/min via INTRAVENOUS

## 2017-08-09 MED ORDER — CEFAZOLIN SODIUM-DEXTROSE 2-4 GM/100ML-% IV SOLN
2.0000 g | INTRAVENOUS | Status: AC
  Administered 2017-08-09: 2 g via INTRAVENOUS

## 2017-08-09 MED ORDER — LIDOCAINE 2% (20 MG/ML) 5 ML SYRINGE
INTRAMUSCULAR | Status: AC
  Filled 2017-08-09: qty 5

## 2017-08-09 MED ORDER — SUGAMMADEX SODIUM 200 MG/2ML IV SOLN
INTRAVENOUS | Status: DC | PRN
  Administered 2017-08-09: 200 mg via INTRAVENOUS

## 2017-08-09 MED ORDER — MEPERIDINE HCL 50 MG/ML IJ SOLN: 6.2500 mg | INTRAMUSCULAR | Status: DC | PRN

## 2017-08-09 MED ORDER — CHLORHEXIDINE GLUCONATE 4 % EX LIQD: 60.0000 mL | Freq: Once | CUTANEOUS | Status: DC

## 2017-08-09 MED ORDER — BISACODYL 5 MG PO TBEC: 5.0000 mg | DELAYED_RELEASE_TABLET | Freq: Every day | ORAL | Status: DC | PRN

## 2017-08-09 MED ORDER — LIDOCAINE 2% (20 MG/ML) 5 ML SYRINGE
INTRAMUSCULAR | Status: DC | PRN
  Administered 2017-08-09: 60 mg via INTRAVENOUS

## 2017-08-09 MED ORDER — DEXAMETHASONE SODIUM PHOSPHATE 10 MG/ML IJ SOLN
INTRAMUSCULAR | Status: DC | PRN
  Administered 2017-08-09: 5 mg via INTRAVENOUS

## 2017-08-09 MED ORDER — HYDROCHLOROTHIAZIDE 25 MG PO TABS
25.0000 mg | ORAL_TABLET | Freq: Every day | ORAL | Status: DC
  Administered 2017-08-10: 25 mg via ORAL
  Filled 2017-08-09: qty 1

## 2017-08-09 MED ORDER — PHENYLEPHRINE 40 MCG/ML (10ML) SYRINGE FOR IV PUSH (FOR BLOOD PRESSURE SUPPORT)
PREFILLED_SYRINGE | INTRAVENOUS | Status: DC | PRN
  Administered 2017-08-09: 80 ug via INTRAVENOUS

## 2017-08-09 MED ORDER — TIZANIDINE HCL 4 MG PO TABS
4.0000 mg | ORAL_TABLET | Freq: Three times a day (TID) | ORAL | Status: DC | PRN
  Administered 2017-08-10: 4 mg via ORAL
  Filled 2017-08-09: qty 1

## 2017-08-09 MED ORDER — PROMETHAZINE HCL 25 MG/ML IJ SOLN: 6.2500 mg | INTRAMUSCULAR | Status: DC | PRN

## 2017-08-09 MED ORDER — PANTOPRAZOLE SODIUM 40 MG PO TBEC
40.0000 mg | DELAYED_RELEASE_TABLET | Freq: Every day | ORAL | Status: DC
  Administered 2017-08-09 – 2017-08-10 (×2): 40 mg via ORAL
  Filled 2017-08-09 (×2): qty 1

## 2017-08-09 MED ORDER — METOCLOPRAMIDE HCL 5 MG PO TABS: 5.0000 mg | ORAL_TABLET | Freq: Three times a day (TID) | ORAL | Status: DC | PRN

## 2017-08-09 MED ORDER — OXYCODONE HCL 5 MG PO TABS
ORAL_TABLET | ORAL | Status: AC
  Filled 2017-08-09: qty 2

## 2017-08-09 MED ORDER — OXYCODONE HCL 5 MG PO TABS
10.0000 mg | ORAL_TABLET | ORAL | Status: DC | PRN
  Administered 2017-08-09 – 2017-08-10 (×2): 10 mg via ORAL
  Filled 2017-08-09: qty 2

## 2017-08-09 MED ORDER — SODIUM CHLORIDE 0.9 % IJ SOLN
INTRAMUSCULAR | Status: AC
  Filled 2017-08-09: qty 10

## 2017-08-09 MED ORDER — MIDAZOLAM HCL 2 MG/2ML IJ SOLN
INTRAMUSCULAR | Status: AC
  Filled 2017-08-09: qty 2

## 2017-08-09 MED ORDER — HYDROMORPHONE HCL 2 MG/ML IJ SOLN
INTRAMUSCULAR | Status: AC
  Filled 2017-08-09: qty 1

## 2017-08-09 MED ORDER — DIPHENHYDRAMINE HCL 12.5 MG/5ML PO ELIX: 12.5000 mg | ORAL_SOLUTION | ORAL | Status: DC | PRN

## 2017-08-09 MED ORDER — HYDROMORPHONE HCL 2 MG/ML IJ SOLN: 0.5000 mg | INTRAMUSCULAR | Status: DC | PRN

## 2017-08-09 MED ORDER — FLUOXETINE HCL 20 MG PO CAPS: 20.0000 mg | ORAL_CAPSULE | Freq: Every day | ORAL | Status: DC

## 2017-08-09 MED ORDER — LACTATED RINGERS IV SOLN
INTRAVENOUS | Status: DC | PRN
  Administered 2017-08-09 (×2): via INTRAVENOUS

## 2017-08-09 MED ORDER — MENTHOL 3 MG MT LOZG: 1.0000 | LOZENGE | OROMUCOSAL | Status: DC | PRN

## 2017-08-09 SURGICAL SUPPLY — 53 items
BASEPLATE GLENOID SHLDR SM (Shoulder) ×2 IMPLANT
BLADE SAW SGTL 83.5X18.5 (BLADE) ×2 IMPLANT
COVER SURGICAL LIGHT HANDLE (MISCELLANEOUS) ×2 IMPLANT
CUP SUT UNIV REVERS 36 NEUTRAL (Cup) ×2 IMPLANT
DERMABOND ADVANCED (GAUZE/BANDAGES/DRESSINGS) ×1
DERMABOND ADVANCED .7 DNX12 (GAUZE/BANDAGES/DRESSINGS) ×1 IMPLANT
DRAPE ORTHO SPLIT 77X108 STRL (DRAPES) ×2
DRAPE SURG 17X11 SM STRL (DRAPES) ×2 IMPLANT
DRAPE SURG ORHT 6 SPLT 77X108 (DRAPES) ×2 IMPLANT
DRAPE U-SHAPE 47X51 STRL (DRAPES) ×2 IMPLANT
DRSG AQUACEL AG ADV 3.5X10 (GAUZE/BANDAGES/DRESSINGS) ×2 IMPLANT
DURAPREP 26ML APPLICATOR (WOUND CARE) ×4 IMPLANT
ELECT BLADE 4.0 EZ CLEAN MEGAD (MISCELLANEOUS) ×2
ELECT CAUTERY BLADE 6.4 (BLADE) ×2 IMPLANT
ELECT REM PT RETURN 9FT ADLT (ELECTROSURGICAL) ×2
ELECTRODE BLDE 4.0 EZ CLN MEGD (MISCELLANEOUS) ×1 IMPLANT
ELECTRODE REM PT RTRN 9FT ADLT (ELECTROSURGICAL) ×1 IMPLANT
FACESHIELD WRAPAROUND (MASK) ×6 IMPLANT
GLENOSPHERE LATERAL 36MM+4 (Shoulder) ×2 IMPLANT
GLOVE BIO SURGEON STRL SZ7.5 (GLOVE) ×2 IMPLANT
GLOVE BIO SURGEON STRL SZ8 (GLOVE) ×2 IMPLANT
GLOVE EUDERMIC 7 POWDERFREE (GLOVE) ×2 IMPLANT
GLOVE SS BIOGEL STRL SZ 7.5 (GLOVE) ×1 IMPLANT
GLOVE SUPERSENSE BIOGEL SZ 7.5 (GLOVE) ×1
GOWN STRL REUS W/ TWL LRG LVL3 (GOWN DISPOSABLE) ×1 IMPLANT
GOWN STRL REUS W/ TWL XL LVL3 (GOWN DISPOSABLE) ×2 IMPLANT
GOWN STRL REUS W/TWL LRG LVL3 (GOWN DISPOSABLE) ×1
GOWN STRL REUS W/TWL XL LVL3 (GOWN DISPOSABLE) ×2
KIT BASIN OR (CUSTOM PROCEDURE TRAY) ×2 IMPLANT
KIT TURNOVER KIT B (KITS) ×2 IMPLANT
LINER HUMERAL 36 +3MM SM (Shoulder) ×2 IMPLANT
MANIFOLD NEPTUNE II (INSTRUMENTS) ×2 IMPLANT
NEEDLE TAPERED W/ NITINOL LOOP (MISCELLANEOUS) ×2 IMPLANT
NS IRRIG 1000ML POUR BTL (IV SOLUTION) ×2 IMPLANT
PACK SHOULDER (CUSTOM PROCEDURE TRAY) ×2 IMPLANT
PAD ARMBOARD 7.5X6 YLW CONV (MISCELLANEOUS) ×4 IMPLANT
RESTRAINT HEAD UNIVERSAL NS (MISCELLANEOUS) ×2 IMPLANT
SCREW CENTRAL NONLOCK 6.5X20MM (Shoulder) ×2 IMPLANT
SCREW LOCK PERIPHERAL 30MM (Shoulder) ×4 IMPLANT
SET PIN UNIVERSAL REVERSE (SET/KITS/TRAYS/PACK) ×2 IMPLANT
SLING ARM FOAM STRAP LRG (SOFTGOODS) ×2 IMPLANT
SPACER SHLD UNI REV 36+9 (Spacer) ×2 IMPLANT
SPONGE LAP 18X18 X RAY DECT (DISPOSABLE) ×2 IMPLANT
SPONGE LAP 4X18 X RAY DECT (DISPOSABLE) ×2 IMPLANT
STEM HUMERAL UNIVERS SZ8 (Stem) ×2 IMPLANT
SUT MNCRL AB 3-0 PS2 18 (SUTURE) ×2 IMPLANT
SUT MON AB 2-0 CT1 27 (SUTURE) ×2 IMPLANT
SUT VIC AB 1 CT1 27 (SUTURE) ×1
SUT VIC AB 1 CT1 27XBRD ANBCTR (SUTURE) ×1 IMPLANT
SUTURE TAPE 1.3 40 TPR END (SUTURE) ×1 IMPLANT
SUTURETAPE 1.3 40 TPR END (SUTURE) ×2
TOWEL OR 17X26 10 PK STRL BLUE (TOWEL DISPOSABLE) ×2 IMPLANT
WATER STERILE IRR 1000ML POUR (IV SOLUTION) ×2 IMPLANT

## 2017-08-09 NOTE — Anesthesia Postprocedure Evaluation (Signed)
Anesthesia Post Note  Patient: Tracey Walton  Procedure(s) Performed: RIGHT REVERSE SHOULDER ARTHROPLASTY (Right Shoulder)     Patient location during evaluation: PACU Anesthesia Type: General Level of consciousness: awake Pain management: pain level controlled Vital Signs Assessment: post-procedure vital signs reviewed and stable Respiratory status: spontaneous breathing Cardiovascular status: stable Postop Assessment: no apparent nausea or vomiting Anesthetic complications: no    Last Vitals:  Vitals:   08/09/17 0613 08/09/17 0938  BP: (!) 144/59 (!) 131/57  Pulse: 68 67  Resp: 20 16  Temp: 36.4 C (!) 36.4 C  SpO2: 96%     Last Pain:  Vitals:   08/09/17 0613  TempSrc: Oral  PainSc:    Pain Goal:                 Tifanie Gardiner JR,JOHN Markela Wee

## 2017-08-09 NOTE — Op Note (Signed)
08/09/2017  9:30 AM  PATIENT:   Tracey Walton  77 y.o. female  PRE-OPERATIVE DIAGNOSIS:  Right Shoulder rotator cuff tear arthropathy  POST-OPERATIVE DIAGNOSIS:  same  PROCEDURE:  R RSA #8 stem, +6 spacer, +6 poly, 36/+4 glenosphere, small baseplate  SURGEON:  Robinson Brinkley, Vania Rea M.D.  ASSISTANTS: Shuford pac   ANESTHESIA:   GET + ISB  EBL: 200  SPECIMEN:  none  Drains: none   PATIENT DISPOSITION:  PACU - hemodynamically stable.  Brief history:  Tracey Walton is a 77 year old female has had chronic and progressively increasing right shoulder pain related to end-stage rotator cuff tear arthropathy.  Due to her ongoing pain and functional limitations she is brought to the operating this time for planned right shoulder reverse arthroplasty as described below  Preoperative counseled Tracey Walton regarding treatment options, as well as the potential risks versus benefits thereof.  Possible surgical complications were all reviewed with the potential for bleeding, infection, neurovascular injury, persistent pain, anesthetic location, and possible need for additional surgery.  She understands and accepts and agrees with the planned procedure.  Procedure in detail  In the preop holding area the patient received prophylactic antibiotics and interscalene block with Exparel was placed by the anesthesia service.  To the operating placed supine on the table on which was induction of a general endotracheal anesthesia.  Placed in the beachchair position and appropriate padding intact.  The right shoulder girdle region was sterilely prepped and draped in standard fashion.  Timeout was called.  Anterior deltopectoral approach to the right shoulder was made through a 10 cm incision.  Skin flaps were elevated dissection carried deeply electrocautery used for hemostasis.  The deltopectoral interval was then developed from proximal to distal with the upper centimeter of the pectoralis major tendon tenotomized  to improve exposure.  Conjoined tendon was mobilized and retracted medially with a self-retaining retractor.  Biceps tendon was then tenodesed at the upper border of the pectoralis major with a #2 suture tape.  Tendon was then tenotomized and the proximal portion unroofed and excised.  The subscapularis was then released from the lesser tuberosity using electrocautery and the free margin was tagged with the pair of suture tape sutures.  We then divided the capsule attachments anteriorly and her margins of the humeral neck allowing to deliver the humeral head to the wound.  We used extra medullary guide to outline the proposed humeral head resection which was then performed with an oscillating saw maintaining the need for diversion of approximate 20 degrees.  Osteophytes from the margin of the neck with and removed.  Middle Place was cut surface the proximal humerus this point and then exposed the glenoid with a combination of a Coude, pitchfork, and stick, retractors.  I performed a circumferential labral resection getting complete visualization of the periphery of the glenoid.  Guidepin placed in the central glenoid glenoid was then prepared with a central followed by peripheral reamer obtaining excellent bony bed for our baseplate.  The baseplate was then impacted and casting with a central lag screw followed by the inferior and superior locking screws all of these obtained excellent bony purchase and fixation.  We then placed a 36+4 glenosphere onto the baseplate which was impacted and showed stable fixation.  We then returned attention back to the proximal humerus which we hand reamed up to a size 6 broached up to size 8 with excellent fixation.  Then performed metaphyseal reaming with a neutral reaming peg.  At this point  trial reduction showed good soft tissue balance good stability.  We then assembled the final implant on the back table this was then impacted into the humeral canal with excellent stability  for fixation.  We then performed a series of trial reductions and ultimately felt that a +9 extension with the +3 probably give [place] the best soft tissue balance.  We then went ahead and use a + extension and +3 poly-final implants.  Final reduction was performed that showed excellent soft tissue balance good shoulder motion good stability.  At this point we then repaired the subscapularis back to the lesser tuberosity region using the eyelets on the collar of our humeral stem.  The wounds are copes irrigated.  Hemostasis was obtained.  The deltopectoral interval was then reapproximated with a series of figure-of-eight #1 Vicryl sutures.  2-0 Vicryl used for subcu layer intra-articular through Monocryl for the skin followed by Dermabond and Aquasol dressing right and was placed in a sling and the patient was awakened extubated and taken recovery room in stable condition.  PLAN OF CARE: Admit for overnight observation     Contact # [phone]

## 2017-08-09 NOTE — Progress Notes (Signed)
Pt arrived to room 5N13 via bed. All questions answered to satisfaction. Aquacel and sling to RUE in place. Pt resting comfortably. Received report from [place], RN in PACU. See assessment. Will continue to monitor.

## 2017-08-09 NOTE — Transfer of Care (Signed)
Immediate Anesthesia Transfer of Care Note  Patient: Tracey Walton  Procedure(s) Performed: RIGHT REVERSE SHOULDER ARTHROPLASTY (Right Shoulder)  Patient Location: PACU  Anesthesia Type:GA combined with regional for post-op pain  Level of Consciousness: drowsy and patient cooperative  Airway & Oxygen Therapy: Patient Spontanous Breathing and Patient connected to face mask oxygen  Post-op Assessment: Report given to RN, Post -op Vital signs reviewed and stable and Patient moving all extremities X 4  Post vital signs: Reviewed and stable  Last Vitals:  Vitals Value Taken Time  BP    Temp 36.4 C 08/09/2017  9:38 AM  Pulse    Resp    SpO2      Last Pain:  Vitals:   08/09/17 6045  TempSrc: Oral  PainSc:          Complications: No apparent anesthesia complications

## 2017-08-09 NOTE — H&P (Signed)
Tracey Walton    Chief Complaint: Right Shoulder rotator cuff tear arthropathy HPI: The patient is a 77 y.o. female with end stage right shoulder rotator cuff tear arthropathy  Past Medical History:  Diagnosis Date  . Arthritis   . Chest pain 2011   negative for MI  . Chronic kidney disease    renal insufficiency. 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 Depression   . Dyspnea    SOB while walking  . GERD (gastroesophageal reflux disease)   . Heart murmur   . History of hiatal hernia   . Hyperlipidemia   . Hypertension   . Pneumonia   . Polymyalgia (HCC)   . Strep throat     Past Surgical History:  Procedure Laterality Date  . carpal tunnel repair     left 2005 right 2003 surgery  . EYE SURGERY     cataracts  . KNEE SURGERY     Bilateral  . PARTIAL HYSTERECTOMY    . REVERSE SHOULDER ARTHROPLASTY Left 10/12/2016  . REVERSE SHOULDER ARTHROPLASTY Left 10/12/2016   Procedure: REVERSE LEFT SHOULDER ARTHROPLASTY;  Surgeon: Tracey Hanly, MD;  Location: [facility] OR;  Service: Orthopedics;  Laterality: Left;  . right shoulder arthroscopy  2003  . TOOTH EXTRACTION      Family History  Problem Relation Age of Onset  . Heart attack Mother   . Heart disease Mother   . Hypertension Father   . Heart failure Father   . Kidney failure Father   . Hyperlipidemia Father   . Hypertension Sister   . Hyperlipidemia Sister     Social History:  reports that she has never smoked. She has never used smokeless tobacco. She reports that she does not drink alcohol or use drugs.   Medications Prior to Admission  Medication Sig Dispense Refill  . acetaminophen (TYLENOL) 325 MG tablet Take 650 mg by mouth 4 (four) times daily as needed (for pain.).    Marland Kitchen amLODipine (NORVASC) 2.5 MG tablet TAKE 1 TABLET BY MOUTH  EVERY EVENING (Patient taking differently: TAKE 2.5 MG BY MOUTH  EVERY EVENING) 30 tablet 0  . atorvastatin (LIPITOR) 40 MG tablet TAKE 1 TABLET BY MOUTH   DAILY AT 6 PM. (Patient taking differently: Take 40 mg by mouth daily at 6 PM. ) 30 tablet 0  . carvedilol (COREG) 6.25 MG tablet Take 1 tablet (6.25 mg total) by mouth 2 (two) times daily. 60 tablet 0  . FLUoxetine (PROZAC) 10 MG capsule Take 10 mg by mouth daily.    Marland Kitchen FLUoxetine (PROZAC) 20 MG capsule Take 20 mg by mouth daily.    . fluticasone (FLONASE) 50 MCG/ACT nasal spray Place 2 sprays into both nostrils daily as needed for allergies.    . hydrochlorothiazide (HYDRODIURIL) 25 MG tablet Take one-half tablet by  mouth daily (Patient taking differently: Take 25 mg by mouth daily. ) 90 tablet 3  . omeprazole (PRILOSEC) 20 MG capsule TAKE 1 CAPSULE BY MOUTH  DAILY (Patient taking differently: TAKE 20 MG BY MOUTH  DAILY) 30 capsule 0  . Polyethyl Glycol-Propyl Glycol (LUBRICANT EYE DROPS) 0.4-0.3 % SOLN Place 1 drop into both eyes 3 (three) times daily as needed (for dry/irritated eyes.).    Marland Kitchen traMADol (ULTRAM) 50 MG tablet Take 50 mg by mouth 4 (four) times daily as needed (for pain.).     Marland Kitchen cetirizine (ZYRTEC) 10 MG tablet Take 10 mg by mouth daily.    Marland Kitchen  vitamin C (ASCORBIC ACID) 500 MG tablet Take 500 mg by mouth daily as needed (for immune health support).       Physical Exam: right shoulder with painful and restricted motion as noted at recent office visits  Vitals  Temp:  [97.6 F (36.4 C)] 97.6 F (36.4 C) (05/09 1610) Pulse Rate:  [68] 68 (05/09 0613) Resp:  [20] 20 (05/09 0613) BP: (144)/(59) 144/59 (05/09 0613) SpO2:  [96 %] 96 % (05/09 0613) Weight:  [104.3 kg (230 lb)] 104.3 kg (230 lb) (05/09 9604)  Assessment/Plan  Impression: Right Shoulder rotator cuff tear arthropathy  Plan of Action: Procedure(s): RIGHT REVERSE SHOULDER ARTHROPLASTY  Tracey Walton M Tracey Walton 08/09/2017, 7:21 AM Contact # [phone]

## 2017-08-09 NOTE — Discharge Instructions (Signed)
° °Kevin M. Supple, M.D., F.A.A.O.S. °Orthopaedic Surgery °Specializing in Arthroscopic and Reconstructive °Surgery of the Shoulder and Knee °[phone] °[address] - [place], [place] [address] - Fax [phone] ° ° °POST-OP TOTAL SHOULDER REPLACEMENT INSTRUCTIONS ° °1. Call the office at [phone] to schedule your first post-op appointment 10-14 days from the date of your surgery. ° °2. The bandage over your incision is waterproof. You may begin showering with this dressing on. You may leave this dressing on until first follow up appointment within 2 weeks. We prefer you leave this dressing in place until follow up however after 5-7 days if you are having itching or skin irritation and would like to remove it you may do so. Go slow and tug at the borders gently to break the bond the dressing has with the skin. At this point if there is no drainage it is okay to go without a bandage or you may cover it with a light guaze and tape. You can also expect significant bruising around your shoulder that will drift down your arm and into your chest wall. This is very normal and should resolve over several days. ° ° 3. Wear your sling/immobilizer at all times except to perform the exercises below or to occasionally let your arm dangle by your side to stretch your elbow. You also need to sleep in your sling immobilizer until instructed otherwise. ° °4. Range of motion to your elbow, wrist, and hand are encouraged 3-5 times daily. Exercise to your hand and fingers helps to reduce swelling you may experience. ° °5. Utilize ice to the shoulder 3-5 times minimum a day and additionally if you are experiencing pain. ° °6. Prescriptions for a pain medication and a muscle relaxant are provided for you. It is recommended that if you are experiencing pain that you pain medication alone is not controlling, add the muscle relaxant along with the pain medication which can give additional pain relief. The first 1-2 days  is generally the most severe of your pain and then should gradually decrease. As your pain lessens it is recommended that you decrease your use of the pain medications to an "as needed basis'" only and to always comply with the recommended dosages of the pain medications. ° °7. Pain medications can produce constipation along with their use. If you experience this, the use of an over the counter stool softener or laxative daily is recommended.  ° °8. For additional questions or concerns, please do not hesitate to call the office. If after hours there is an answering service to forward your concerns to the physician on call. ° °9.Pain control following an exparel block ° °To help control your post-operative pain you received a nerve block  performed with Exparel which is a long acting anesthetic (numbing agent) which can provide pain relief and sensations of numbness (and relief of pain) in the operative shoulder and arm for up to 3 days. Sometimes it provides mixed relief, meaning you may still have numbness in certain areas of the arm but can still be able to move  parts of that arm, hand, and fingers. We recommend that your prescribed pain medications  be used as needed. We do not feel it is necessary to "pre medicate" and "stay ahead" of pain.  Taking narcotic pain medications when you are not having any pain can lead to unnecessary and potentially dangerous side effects.  ° °POST-OP EXERCISES ° °Pendulum Exercises ° °Perform pendulum exercises while standing and bending at   the waist. Support your uninvolved arm on a table or chair and allow your operated arm to hang freely. Make sure to do these exercises passively - not using you shoulder muscles. ° °Repeat 20 times. Do 3 sessions per day. ° ° ° ° °

## 2017-08-09 NOTE — Anesthesia Procedure Notes (Signed)
Anesthesia Regional Block: Interscalene brachial plexus block   Pre-Anesthetic Checklist: ,, timeout performed, Correct Patient, Correct Site, Correct Laterality, Correct Procedure, Correct Position, site marked, Risks and benefits discussed,  Surgical consent,  Pre-op evaluation,  At surgeon's request and post-op pain management  Laterality: Right and Upper  Prep: chloraprep       Needles:  Injection technique: Single-shot  Needle Type: Echogenic Stimulator Needle     Needle Length: 10cm  Needle Gauge: 21   Needle insertion depth: 1 cm   Additional Needles:   Procedures:,,,, ultrasound used (permanent image in chart),,,,  Narrative:  Start time: 08/09/2017 7:16 AM End time: 08/09/2017 7:31 AM Injection made incrementally with aspirations every 5 mL.  Performed by: Personally  Anesthesiologist: Leilani Able, MD

## 2017-08-09 NOTE — Anesthesia Procedure Notes (Signed)
Procedure Name: Intubation Date/Time: 08/09/2017 7:51 AM Performed by: Freddie Breech, CRNA Pre-anesthesia Checklist: Patient identified, Emergency Drugs available, Suction available and Patient being monitored Patient Re-evaluated:Patient Re-evaluated prior to induction Oxygen Delivery Method: Circle System Utilized Preoxygenation: Pre-oxygenation with 100% oxygen Induction Type: IV induction Ventilation: Mask ventilation without difficulty and Oral airway inserted - appropriate to patient size Laryngoscope Size: Mac and 3 Grade View: Grade I Tube type: Oral Tube size: 7.0 mm Number of attempts: 1 Airway Equipment and Method: Stylet and Oral airway Placement Confirmation: ETT inserted through vocal cords under direct vision,  positive ETCO2 and breath sounds checked- equal and bilateral Secured at: 21 cm Tube secured with: Tape Dental Injury: Teeth and Oropharynx as per pre-operative assessment

## 2017-08-10 ENCOUNTER — Encounter (HOSPITAL_COMMUNITY): Payer: Self-pay | Admitting: Orthopedic Surgery

## 2017-08-10 MED ORDER — NAPROXEN 500 MG PO TABS: 500.0000 mg | ORAL_TABLET | Freq: Two times a day (BID) | ORAL | 1 refills | Status: DC

## 2017-08-10 MED ORDER — TIZANIDINE HCL 4 MG PO CAPS: 4.0000 mg | ORAL_CAPSULE | Freq: Three times a day (TID) | ORAL | 0 refills | Status: DC | PRN

## 2017-08-10 MED ORDER — OXYCODONE-ACETAMINOPHEN 5-325 MG PO TABS: 1.0000 | ORAL_TABLET | ORAL | 0 refills | Status: DC | PRN

## 2017-08-10 MED ORDER — FLUOXETINE HCL 20 MG PO CAPS
30.0000 mg | ORAL_CAPSULE | Freq: Every day | ORAL | Status: DC
  Filled 2017-08-10: qty 1

## 2017-08-10 MED ORDER — TRAMADOL HCL 50 MG PO TABS: 50.0000 mg | ORAL_TABLET | Freq: Four times a day (QID) | ORAL | 0 refills | Status: DC | PRN

## 2017-08-10 NOTE — Evaluation (Signed)
Occupational Therapy Evaluation Patient Details Name: Tracey Walton MRN: [id number] DOB: [birth year] Today's Date: 08/10/2017    History of Present Illness 77 yo female s/p RIGHT REVERSE SHOULDER ARTHROPLASTY on 08/09/2017   Clinical Impression   Patient evaluated by Occupational Therapy with no further acute OT needs identified. All education has been completed and the patient has no further questions. See below for any follow-up Occupational Therapy or equipment needs. OT to sign off. Thank you for referral.   Transport arriving just after OT and OT advocating for transport to wait for d/ cto make sure patient has necessary information. RN called d/c patient prior to OT. Charge RN made aware.      Follow Up Recommendations  No OT follow up    Equipment Recommendations  None recommended by OT    Recommendations for Other Services       Precautions / Restrictions Precautions Precautions: Shoulder Shoulder Interventions: Shoulder sling/immobilizer Precaution Comments: shoulder handout provided and reviewed for adls/ pendulums/ lap slides. Educated on sleep in sling at this time . Off when seated for comfort oly Restrictions Weight Bearing Restrictions: Yes RUE Weight Bearing: Non weight bearing      Mobility Bed Mobility               General bed mobility comments: up in chair on arrival. Reports only sleeping in the chaira t home  Transfers Overall transfer level: Needs assistance   Transfers: Sit to/from Stand Sit to Stand: Min assist         General transfer comment: requires (A) to power up into standing with R UE on chair to push    Balance Overall balance assessment: Needs assistance         Standing balance support: Single extremity supported Standing balance-Leahy Scale: Fair                             ADL either performed or assessed with clinical judgement   ADL Overall ADL's : Needs assistance/impaired                                        General ADL Comments: pt dressed on arrival and reports daughter (A) . Pt educated on dressing L UE first and then R UE. pt educated on ROM restrictions and ADL use only. Pt expressed understanding. Pt plans to wear house coats initailly upon d/c home     Vision Baseline Vision/History: Wears glasses Wears Glasses: At all times       Perception     Praxis      Pertinent Vitals/Pain Pain Assessment: No/denies pain     Hand Dominance Right   Extremity/Trunk Assessment Upper Extremity Assessment Upper Extremity Assessment: LUE deficits/detail LUE Deficits / Details: s/p surg with sling   Lower Extremity Assessment Lower Extremity Assessment: Generalized weakness(hx bil knee replacements)   Cervical / Trunk Assessment Cervical / Trunk Assessment: Kyphotic   Communication Communication Communication: No difficulties   Cognition Arousal/Alertness: Awake/alert Behavior During Therapy: WFL for tasks assessed/performed Overall Cognitive Status: Within Functional Limits for tasks assessed                                 General Comments: w   General Comments  educated on new linen each and every shower,  avoid washing on the cut and the dressing is wet to dry    Exercises Exercises: Shoulder Shoulder Exercises Pendulum Exercise: Left;10 reps;Standing Elbow Flexion: AROM;10 reps;Left Wrist Flexion: AROM;10 reps;Seated Digit Composite Flexion: AROM;Left;10 reps   Shoulder Instructions Shoulder Instructions Donning/doffing shirt without moving shoulder: Patient able to independently direct caregiver Method for sponge bathing under operated UE: Patient able to independently direct caregiver Donning/doffing sling/immobilizer: Patient able to independently direct caregiver Correct positioning of sling/immobilizer: Patient able to independently direct caregiver Pendulum exercises (written home exercise program): Minimal  assistance ROM for elbow, wrist and digits of operated UE: Independent Sling wearing schedule (on at all times/off for ADL's): Independent Positioning of UE while sleeping: Independent    Home Living Family/patient expects to be discharged to:: Private residence Living Arrangements: Children Available Help at Discharge: Family Type of Home: House             Bathroom Shower/Tub: Other (comment)   Bathroom Toilet: Standard     Home Equipment: Cane - single point   Additional Comments: plans to sponge bath at this time. daughter (A) with bathing/ dressing per patient      Prior Functioning/Environment Level of Independence: Independent        Comments: pt with previous R shoulder 1 year aog        OT Problem List:        OT Treatment/Interventions:      OT Goals(Current goals can be found in the care plan section)    OT Frequency:     Barriers to D/C:            Co-evaluation              AM-PAC PT "6 Clicks" Daily Activity     Outcome Measure Help from another person eating meals?: None Help from another person taking care of personal grooming?: A Little Help from another person toileting, which includes using toliet, bedpan, or urinal?: A Little Help from another person bathing (including washing, rinsing, drying)?: A Little Help from another person to put on and taking off regular upper body clothing?: A Little Help from another person to put on and taking off regular lower body clothing?: A Little 6 Click Score: 19   End of Session Nurse Communication: Mobility status;Precautions  Activity Tolerance: Patient tolerated treatment well Patient left: in chair;with call bell/phone within reach;with family/visitor present  OT Visit Diagnosis: Unsteadiness on feet (R26.81)                Time: 4098-1191 OT Time Calculation (min): 9 min Charges:  OT General Charges $OT Visit: 1 Visit OT Evaluation $OT Eval Low Complexity: 1 Low G-CodesMateo Flow   OTR/L Pager: [phone] Office: [phone] .   Boone Master B 08/10/2017, 3:09 PM

## 2017-08-10 NOTE — Discharge Summary (Signed)
PATIENT ID:      Tracey Walton  MRN:     [id number] DOB/AGE:    [birth year] / 77 y.o.     DISCHARGE SUMMARY  ADMISSION DATE:    08/09/2017 DISCHARGE DATE:    ADMISSION DIAGNOSIS: Right Shoulder rotator cuff tear arthropathy Past Medical History:  Diagnosis Date  . Anxiety   . Arthritis    "qwhere" (08/09/2017)  . Chest pain 2011   negative for MI  . Chronic kidney disease    renal insufficiency. 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 Chronic lower back pain   . Depression   . Dyspnea    SOB while walking  . GERD (gastroesophageal reflux disease)   . Heart murmur   . History of hiatal hernia   . Hyperlipidemia   . Hypertension   . Pneumonia ~ 2014  . Polymyalgia (HCC)   . Strep throat     DISCHARGE DIAGNOSIS:   Active Problems:   S/p reverse total shoulder arthroplasty   PROCEDURE: Procedure(s): RIGHT REVERSE SHOULDER ARTHROPLASTY on 08/09/2017  CONSULTS:    HISTORY:  See H&P in chart.  HOSPITAL COURSE:  Tracey Walton is a 77 y.o. admitted on 08/09/2017 with a diagnosis of Right Shoulder rotator cuff tear arthropathy.  They were brought to the operating room on 08/09/2017 and underwent Procedure(s): RIGHT REVERSE SHOULDER ARTHROPLASTY.    They were given perioperative antibiotics:  Anti-infectives (From admission, onward)   Start     Dose/Rate Route Frequency Ordered Stop   08/09/17 0600  ceFAZolin (ANCEF) IVPB 2g/100 mL premix     2 g 200 mL/hr over 30 Minutes Intravenous On call to O.R. 08/09/17 0865 08/09/17 0758   08/09/17 0555  ceFAZolin (ANCEF) 2-4 GM/100ML-% IVPB    Note to Pharmacy:  Ray Church   : cabinet override      08/09/17 0555 08/09/17 0753    .  Patient underwent the above named procedure and tolerated it well. The following day they were hemodynamically stable and pain was controlled on oral analgesics. They were neurovascularly intact to the operative extremity. OT was ordered but cancelled as pt had recollection  and was comfortable with exercises. They were medically and orthopaedically stable for discharge on day 1 . She was encouraged to continue her BSIS to help keep her sats up. Pt and daughter voiced understanding.     DIAGNOSTIC STUDIES:  RECENT RADIOGRAPHIC STUDIES :  No results found.  RECENT VITAL SIGNS:   Patient Vitals for the past 24 hrs:  BP Temp Temp src Pulse Resp SpO2 Height Weight  08/10/17 0500 129/60 98.5 F (36.9 C) Oral 69 - 94 % - -  08/09/17 2036 (!) 143/65 97.6 F (36.4 C) Oral 73 - 94 % - -  08/09/17 1551 - - - - - -  (1.651 m) 104.3 kg (229 lb 15 oz)  08/09/17 1504 129/72 (!) 97.3 F (36.3 C) Oral 77 16 98 % - -  08/09/17 1424 138/70 (!) 97.5 F (36.4 C) - 74 16 92 % - -  08/09/17 1354 123/67 - - 70 15 95 % - -  08/09/17 1339 130/71 - - 73 16 94 % - -  08/09/17 1238 129/63 - - 77 18 93 % - -  08/09/17 1138 (!) 142/69 97.6 F (36.4 C) - 70 16 95 % - -  08/09/17 1123 129/70 - - 67 15 96 % - -  08/09/17 1108  99/85 - - 73 19 97 % - -  08/09/17 1053 93/61 - - 66 16 98 % - -  08/09/17 1038 (!) 123/52 - - 65 15 98 % - -  08/09/17 1023 (!) 133/47 - - 71 17 97 % - -  08/09/17 1020 - - - 71 20 97 % - -  08/09/17 1008 114/60 - - 66 17 96 % - -  08/09/17 0938 (!) 131/57 (!) 97.5 F (36.4 C) - 67 16 - - -  .  RECENT EKG RESULTS:    Orders placed or performed during the hospital encounter of 10/09/16  . EKG 12 lead  . EKG 12 lead    DISCHARGE INSTRUCTIONS:    DISCHARGE MEDICATIONS:   Allergies as of 08/10/2017      Reactions   Penicillins Rash   Pt tolerated 2g ancef without issue 10/12/16      Medication List    TAKE these medications   acetaminophen 325 MG tablet Commonly known as:  TYLENOL Take 650 mg by mouth 4 (four) times daily as needed (for pain.).   amLODipine 2.5 MG tablet Commonly known as:  NORVASC TAKE 1 TABLET BY MOUTH  EVERY EVENING What changed:    how much to take  how to take this  when to take this   atorvastatin 40 MG  tablet Commonly known as:  LIPITOR TAKE 1 TABLET BY MOUTH  DAILY AT 6 PM. What changed:    how much to take  how to take this  when to take this  additional instructions   carvedilol 6.25 MG tablet Commonly known as:  COREG Take 1 tablet (6.25 mg total) by mouth 2 (two) times daily.   cetirizine 10 MG tablet Commonly known as:  ZYRTEC Take 10 mg by mouth daily.   FLUoxetine 20 MG capsule Commonly known as:  PROZAC Take 20 mg by mouth daily.   FLUoxetine 10 MG capsule Commonly known as:  PROZAC Take 10 mg by mouth daily.   fluticasone 50 MCG/ACT nasal spray Commonly known as:  FLONASE Place 2 sprays into both nostrils daily as needed for allergies.   hydrochlorothiazide 25 MG tablet Commonly known as:  HYDRODIURIL Take one-half tablet by  mouth daily What changed:    how much to take  how to take this  when to take this  additional instructions   LUBRICANT EYE DROPS 0.4-0.3 % Soln Generic drug:  Polyethyl Glycol-Propyl Glycol Place 1 drop into both eyes 3 (three) times daily as needed (for dry/irritated eyes.).   naproxen 500 MG tablet Commonly known as:  NAPROSYN Take 1 tablet (500 mg total) by mouth 2 (two) times daily with a meal.   omeprazole 20 MG capsule Commonly known as:  PRILOSEC TAKE 1 CAPSULE BY MOUTH  DAILY What changed:    how much to take  how to take this  when to take this   oxyCODONE-acetaminophen 5-325 MG tablet Commonly known as:  PERCOCET Take 1 tablet by mouth every 4 (four) hours as needed for severe pain.   tiZANidine 4 MG capsule Commonly known as:  ZANAFLEX Take 1 capsule (4 mg total) by mouth 3 (three) times daily as needed for muscle spasms.   traMADol 50 MG tablet Commonly known as:  ULTRAM Take 1 tablet (50 mg total) by mouth every 6 (six) hours as needed for moderate pain (for pain.). What changed:    when to take this  reasons to take this   vitamin C  500 MG tablet Commonly known as:  ASCORBIC ACID Take  500 mg by mouth daily as needed (for immune health support).       FOLLOW UP VISIT:   Follow-up Information    Francena Hanly, MD.   Specialty:  Orthopedic Surgery Why:  call to be seen in 10-14 days Contact information: [address] [place] [address] [phone]           DISCHARGE TO: Home   DISCHARGE CONDITION:  Tracey Walton for Dr. Caryn Bee Supple 08/10/2017, 8:16 AM

## 2017-08-10 NOTE — Progress Notes (Signed)
Patient discharge instructions and prescriptions have been given. Patient verbalized understanding. Patient is going home accompanied with daughter.

## 2017-08-24 DIAGNOSIS — Z9889 Other specified postprocedural states: Secondary | ICD-10-CM | POA: Insufficient documentation

## 2017-08-24 DIAGNOSIS — Z96611 Presence of right artificial shoulder joint: Secondary | ICD-10-CM | POA: Insufficient documentation

## 2017-11-06 NOTE — Progress Notes (Signed)
Subjective:    Patient ID: Tracey Walton, female    DOB: 1940-10-10, 77 y.o.   MRN: [id number]  HPI: Tracey Walton is here to establish as a new pt.  She is a pleasant 77 year old female.  PMH: HTN, HLD, GERD, Depression, Heart Murmur, CKD She had R shoulder surgery 08/07/2017- reports being "pretty inactive since then" She eats a diet high in saturated fat/sugar and denies any exercise due to "my arthritis everywhere". She reports dyspnea with exertion steadily worsening >12 months Reports sig dyspnea when walking from beach to [place] last week, est distance approx 100 yards Last OV with Dr. Melburn Popper 09/2015, advised to f/u in 6 months- advised to be seen by cards ASAP She reports sleeping in a recliner due to lumbar back pain She is followed by Dr. 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 Depression 11/07/2017  . Healthcare maintenance 11/07/2017  . Dyspnea on exertion 11/07/2017  . 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 HTN (hypertension) 05/29/2014  . Chest pressure 05/29/2014  . Hyperlipidemia 05/29/2014     Past Medical History:  Diagnosis Date  . Anxiety   . Arthritis    "qwhere" (08/09/2017)  . Chest pain 2011   negative for MI  . Chronic kidney disease    renal insufficiency. 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 Chronic lower back pain   . Depression   . Dyspnea    SOB while walking  . GERD (gastroesophageal reflux disease)   . Heart murmur   . History of hiatal hernia   . Hyperlipidemia    . Hypertension   . Pneumonia ~ 2014  . Polymyalgia (HCC)   . Strep throat      Past Surgical History:  Procedure Laterality Date  . ABDOMINAL HYSTERECTOMY     partial  . BILATERAL CARPAL TUNNEL RELEASE  2003-2005   right-left  . CATARACT EXTRACTION W/ INTRAOCULAR LENS  IMPLANT, BILATERAL Bilateral   . JOINT REPLACEMENT    . REVERSE SHOULDER ARTHROPLASTY Left 10/12/2016   Procedure: REVERSE LEFT SHOULDER ARTHROPLASTY;  Surgeon: Francena Hanly, MD;  Location: [facility] OR;  Service: Orthopedics;  Laterality: Left;  . REVERSE SHOULDER ARTHROPLASTY Right 08/09/2017   Procedure: RIGHT REVERSE SHOULDER ARTHROPLASTY;  Surgeon: Francena Hanly, MD;  Location: [facility] OR;  Service: Orthopedics;  Laterality: Right;  . SHOULDER ARTHROSCOPY Right 2003  . TOOTH EXTRACTION     "1 tooth" (08/09/2017)  . TOTAL KNEE ARTHROPLASTY Bilateral      Family History  Problem Relation Age of Onset  . Heart attack Mother   . Heart disease Mother   . Hypertension Father   . Heart failure Father   . Kidney failure Father   . Hyperlipidemia Father   . Stroke Father   . Hypertension Sister   . Hyperlipidemia Sister      Social History   Substance and Sexual Activity  Drug Use Never     Social History  Substance and Sexual Activity  Alcohol Use Never  . Frequency: Never     Social History   Tobacco Use  Smoking Status Never Smoker  Smokeless Tobacco Never Used     Outpatient Encounter Medications as of 11/07/2017  Medication Sig Note  . acetaminophen (TYLENOL) 325 MG tablet Take 650 mg by mouth 4 (four) times daily as needed (for pain.).   Marland Kitchen. amLODipine (NORVASC) 2.5 MG tablet TAKE 1 TABLET BY MOUTH  EVERY EVENING (Patient taking differently: TAKE 2.5 MG BY MOUTH  EVERY EVENING)   . atorvastatin (LIPITOR) 40 MG tablet TAKE 1 TABLET BY MOUTH  DAILY AT 6 PM. (Patient taking differently: Take 40 mg by mouth daily at 6 PM. )   . carvedilol (COREG) 6.25 MG tablet Take 1 tablet (6.25 mg total) by mouth 2  (two) times daily.   . cetirizine (ZYRTEC) 10 MG tablet Take 10 mg by mouth daily.   Marland Kitchen. FLUoxetine (PROZAC) 10 MG capsule Take 10 mg by mouth daily. 07/23/2017: TOTAL DAILY DOSE=30 MG   . FLUoxetine (PROZAC) 20 MG capsule Take 20 mg by mouth daily. 07/23/2017: TOTAL DAILY DOSE=30 MG  . fluticasone (FLONASE) 50 MCG/ACT nasal spray Place 2 sprays into both nostrils daily as needed for allergies.   . hydrochlorothiazide (HYDRODIURIL) 25 MG tablet Take 1 tablet by mouth daily.   . naproxen (NAPROSYN) 500 MG tablet Take 1 tablet (500 mg total) by mouth 2 (two) times daily with a meal.   . omeprazole (PRILOSEC) 20 MG capsule TAKE 1 CAPSULE BY MOUTH  DAILY (Patient taking differently: TAKE 20 MG BY MOUTH  DAILY)   . Polyethyl Glycol-Propyl Glycol (LUBRICANT EYE DROPS) 0.4-0.3 % SOLN Place 1 drop into both eyes 3 (three) times daily as needed (for dry/irritated eyes.).   Marland Kitchen. traMADol (ULTRAM) 50 MG tablet Take 1 tablet (50 mg total) by mouth every 6 (six) hours as needed for moderate pain (for pain.).   Marland Kitchen. pneumococcal 23 valent vaccine (PNEUMOVAX 23) 25 MCG/0.5ML injection Inject 0.5 mLs into the muscle tomorrow at 10 am for 1 dose.   Marland Kitchen. Zoster Vaccine Adjuvanted [facility](SHINGRIX) injection Inject 0.5 mLs into the muscle once for 1 dose.   . [DISCONTINUED] hydrochlorothiazide (HYDRODIURIL) 25 MG tablet Take one-half tablet by  mouth daily (Patient taking differently: Take 25 mg by mouth daily. )   . [DISCONTINUED] oxyCODONE-acetaminophen (PERCOCET) 5-325 MG tablet Take 1 tablet by mouth every 4 (four) hours as needed for severe pain.   . [DISCONTINUED] tiZANidine (ZANAFLEX) 4 MG capsule Take 1 capsule (4 mg total) by mouth 3 (three) times daily as needed for muscle spasms.   . [DISCONTINUED] vitamin C (ASCORBIC ACID) 500 MG tablet Take 500 mg by mouth daily as needed (for immune health support).    No facility-administered encounter medications on file as of 11/07/2017.     Allergies: Penicillins  Body mass index is  39.88 kg/m.  Blood pressure 136/77, pulse 75, height 5' 4.5" (1.638 m), weight 236 lb (107 kg), SpO2 97 %.  Review of Systems  Constitutional: Positive for fatigue. Negative for activity change, appetite change, chills, diaphoresis, fever and unexpected weight change.  Eyes: Negative for visual disturbance.  Respiratory: Negative for cough, chest tightness, shortness of breath, wheezing and stridor.   Cardiovascular: Negative for chest pain, palpitations and leg swelling.  Gastrointestinal: Positive for constipation. Negative for abdominal distention, abdominal pain, blood in stool, diarrhea, nausea and vomiting.  Genitourinary: Negative for difficulty urinating and frequency.  Musculoskeletal: Positive for  arthralgias, back pain, gait problem, joint swelling, myalgias and neck pain.  Skin: Negative for color change, pallor, rash and wound.  Neurological: Positive for dizziness. Negative for headaches.  Hematological: Does not bruise/bleed easily.  Psychiatric/Behavioral: Negative for confusion, decreased concentration, dysphoric mood, hallucinations, self-injury, sleep disturbance and suicidal ideas. The patient is not nervous/anxious and is not hyperactive.        Objective:   Physical Exam  Constitutional: She appears well-developed and well-nourished. No distress.  HENT:  Head: Normocephalic and atraumatic.  Right Ear: External ear normal.  Left Ear: External ear normal.  Nose: Nose normal.  Mouth/Throat: Oropharynx is clear and moist.  Eyes: Pupils are equal, round, and reactive to light. Conjunctivae and EOM are normal.  Cardiovascular: Normal rate, regular rhythm and intact distal pulses.  Murmur heard. Pulmonary/Chest: Effort normal and breath sounds normal. No stridor. No respiratory distress. She has no decreased breath sounds. She has no wheezes. She has no rhonchi. She has no rales. She exhibits no tenderness.  Sat 97% No acute respiratory distress noted  Skin: She is  not diaphoretic.  Nursing note and vitals reviewed.     Assessment & Plan:   1. Need for shingles vaccine   2. Need for pneumococcal vaccination   3. Hypertension, unspecified type   4. Hyperlipidemia, unspecified hyperlipidemia type   5. Healthcare maintenance   6. Need for hepatitis C screening test   7. Postmenopausal   8. Chest pressure   9. Dyspnea on exertion   10. BMI 39.0-39.9,adult     HTN (hypertension) BP at goal 136/77, HR 75 Continue amlodipine 2.5mg  QD, Carvedilol 6.25mg  BID, HCTZ 25mg  QD  Chest pressure Last OV with Dr. Melburn Popper 09/2015 Please call Dr. Sallee Provencal office and make appt ASAP, re: increased shortness of breath with exertion. Dr. Deatra [facility] [address] [place], [place], [place]  [address] Phone: [phone]; Fax: [phone]   Healthcare maintenance Do not exercise/exert yourself until seen by cards Last OV with Dr. Melburn Popper 09/2015 Please call Dr. Sallee Provencal office and make appt ASAP, re: increased shortness of breath with exertion. Dr. Deatra [facility] [address] [place], [place], [place]  [address] Phone: [phone]; Fax: [phone]  Dyspnea on exertion Steadily worsening >12 months Reports sig dyspnea when walking from beach to car last week, est distance approx 100 yards Do not exercise/exert yourself until seen by cards Last OV with Dr. Melburn Popper 09/2015 Please call Dr. Sallee Provencal office and make appt ASAP, re: increased shortness of breath with exertion. Dr. Deatra [facility] [address] [place], [place], [place]  [address] Phone: [phone]; Fax: [phone]  BMI 39.0-39.9,adult Body mass index is 39.88 kg/m.  Current wt 236 Reduce portion size, follow Mediterranean diet    FOLLOW-UP:  Return in about 2 months (around 01/07/2018) for CPE.

## 2017-11-07 ENCOUNTER — Encounter: Payer: Self-pay | Admitting: Adult Health

## 2017-11-07 ENCOUNTER — Ambulatory Visit (INDEPENDENT_AMBULATORY_CARE_PROVIDER_SITE_OTHER): Payer: Medicare Other | Admitting: Adult Health

## 2017-11-07 VITALS — BP 136/77 | HR 75 | Ht 64.5 in | Wt 236.0 lb

## 2017-11-07 DIAGNOSIS — F32A Depression, unspecified: Secondary | ICD-10-CM | POA: Insufficient documentation

## 2017-11-07 DIAGNOSIS — R0609 Other forms of dyspnea: Secondary | ICD-10-CM | POA: Insufficient documentation

## 2017-11-07 DIAGNOSIS — Z0000 Encounter for general adult medical examination without abnormal findings: Secondary | ICD-10-CM | POA: Insufficient documentation

## 2017-11-07 DIAGNOSIS — E785 Hyperlipidemia, unspecified: Secondary | ICD-10-CM

## 2017-11-07 DIAGNOSIS — R0600 Dyspnea, unspecified: Secondary | ICD-10-CM | POA: Insufficient documentation

## 2017-11-07 DIAGNOSIS — I10 Essential (primary) hypertension: Secondary | ICD-10-CM | POA: Diagnosis not present

## 2017-11-07 DIAGNOSIS — Z1159 Encounter for screening for other viral diseases: Secondary | ICD-10-CM

## 2017-11-07 DIAGNOSIS — E669 Obesity, unspecified: Secondary | ICD-10-CM | POA: Insufficient documentation

## 2017-11-07 DIAGNOSIS — Z23 Encounter for immunization: Secondary | ICD-10-CM | POA: Diagnosis not present

## 2017-11-07 DIAGNOSIS — Z6839 Body mass index (BMI) 39.0-39.9, adult: Secondary | ICD-10-CM

## 2017-11-07 DIAGNOSIS — Z780 Asymptomatic menopausal state: Secondary | ICD-10-CM

## 2017-11-07 DIAGNOSIS — R0789 Other chest pain: Secondary | ICD-10-CM

## 2017-11-07 DIAGNOSIS — F329 Major depressive disorder, single episode, unspecified: Secondary | ICD-10-CM | POA: Insufficient documentation

## 2017-11-07 MED ORDER — PNEUMOCOCCAL VAC POLYVALENT 25 MCG/0.5ML IJ INJ: 0.5000 mL | INJECTION | INTRAMUSCULAR | 0 refills | Status: AC

## 2017-11-07 MED ORDER — ZOSTER VAC RECOMB ADJUVANTED 50 MCG/0.5ML IM SUSR: 0.5000 mL | Freq: Once | INTRAMUSCULAR | 0 refills | Status: AC

## 2017-11-07 NOTE — Assessment & Plan Note (Signed)
BP at goal 136/77, HR 75 Continue amlodipine 2.5mg  QD, Carvedilol 6.25mg  BID, HCTZ 25mg  QD

## 2017-11-07 NOTE — Assessment & Plan Note (Signed)
Last OV with Dr. Melburn PopperNasher 09/2015 Please call Dr. Sallee ProvencalNasher's office and make appt ASAP, re: increased shortness of breath with exertion. Dr. Deatra RobinsonNasher [facility] [address], [place], [place]  [address] Phone: [phone](336) [phone]; Fax: [phone]

## 2017-11-07 NOTE — Assessment & Plan Note (Addendum)
Do not exercise/exert yourself until seen by cards Last OV with Dr. Melburn PopperNasher 09/2015 Please call Dr. Sallee ProvencalNasher's office and make appt ASAP, re: increased shortness of breath with exertion. Dr. Deatra RobinsonNasher [facility] [address], [place], [place]  [address] Phone: [phone]([phone]; Fax: [phone]

## 2017-11-07 NOTE — Assessment & Plan Note (Signed)
>>  ASSESSMENT AND PLAN FOR HYPERTENSION WRITTEN ON 11/07/2017 12:03 PM BY DANFORD, KATY D, NP  BP at goal 136/77, HR 75 Continue amlodipine 2.5mg  QD, Carvedilol 6.25mg  BID, HCTZ 25mg  QD

## 2017-11-07 NOTE — Assessment & Plan Note (Addendum)
Steadily worsening >12 months Reports sig dyspnea when walking [place] beach to [place] last week, est distance approx 100 yards Do not exercise/exert yourself until seen by cards Last OV with Dr. Melburn PopperNasher 09/2015 Please call Dr. Sallee ProvencalNasher's office and make appt ASAP, re: increased shortness of breath with exertion. Dr. Deatra RobinsonNasher [facility] [address], [place], [place]  [address] Phone: [phone]([phone]; Fax: [phone]

## 2017-11-07 NOTE — Assessment & Plan Note (Signed)
Body mass index is 39.88 kg/m.  Current wt 236 Reduce portion size, follow Mediterranean diet

## 2017-11-07 NOTE — Patient Instructions (Addendum)
Mediterranean Diet A Mediterranean diet refers to food and lifestyle choices that are based on the traditions of countries located on the [place] Sea. This way of eating has been shown to help prevent certain conditions and improve outcomes for people who have chronic diseases, like kidney disease and heart disease. What are tips for following this plan? Lifestyle  Cook and eat meals together with your family, when possible.  Drink enough fluid to keep your urine clear or pale yellow.  Be physically active every day. This includes: ? Aerobic exercise like running or swimming. ? Leisure activities like gardening, walking, or housework.  Get 7-8 hours of sleep each night.  If recommended by your health care provider, drink red wine in moderation. This means 1 glass a day for nonpregnant women and 2 glasses a day for men. A glass of wine equals 5 oz (150 mL). Reading food labels  Check the serving size of packaged foods. For foods such as rice and pasta, the serving size refers to the amount of cooked product, not dry.  Check the total fat in packaged foods. Avoid foods that have saturated fat or trans fats.  Check the ingredients list for added sugars, such as corn syrup. Shopping  At the grocery store, buy most of your food from the areas near the walls of the store. This includes: ? Fresh fruits and vegetables (produce). ? Grains, beans, nuts, and seeds. Some of these may be available in unpackaged forms or large amounts (in bulk). ? Fresh seafood. ? Poultry and eggs. ? Low-fat dairy products.  Buy whole ingredients instead of prepackaged foods.  Buy fresh fruits and vegetables in-season from local farmers markets.  Buy frozen fruits and vegetables in resealable bags.  If you do not have access to quality fresh seafood, buy precooked frozen shrimp or canned fish, such as tuna, salmon, or sardines.  Buy small amounts of raw or cooked vegetables, salads, or olives from the  deli or salad bar at your store.  Stock your pantry so you always have certain foods on hand, such as olive oil, canned tuna, canned tomatoes, rice, pasta, and beans. Cooking  Cook foods with extra-virgin olive oil instead of using butter or other vegetable oils.  Have meat as a side dish, and have vegetables or grains as your main dish. This means having meat in small portions or adding small amounts of meat to foods like pasta or stew.  Use beans or vegetables instead of meat in common dishes like chili or lasagna.  Experiment with different cooking methods. Try roasting or broiling vegetables instead of steaming or sauteing them.  Add frozen vegetables to soups, stews, pasta, or rice.  Add nuts or seeds for added healthy fat at each meal. You can add these to yogurt, salads, or vegetable dishes.  Marinate fish or vegetables using olive oil, lemon juice, garlic, and fresh herbs. Meal planning  Plan to eat 1 vegetarian meal one day each week. Try to work up to 2 vegetarian meals, if possible.  Eat seafood 2 or more times a week.  Have healthy snacks readily available, such as: ? Vegetable sticks with hummus. ? Greek yogurt. ? Fruit and nut trail mix.  Eat balanced meals throughout the week. This includes: ? Fruit: 2-3 servings a day ? Vegetables: 4-5 servings a day ? Low-fat dairy: 2 servings a day ? Fish, poultry, or lean meat: 1 serving a day ? Beans and legumes: 2 or more servings a week ? Nuts   and seeds: 1-2 servings a day ? Whole grains: 6-8 servings a day ? Extra-virgin olive oil: 3-4 servings a day  Limit red meat and sweets to only a few servings a month What are my food choices?  Mediterranean diet ? Recommended ? Grains: Whole-grain pasta. Brown rice. Bulgar wheat. Polenta. Couscous. Whole-wheat bread. Orpah Cobbatmeal. Quinoa. ? Vegetables: Artichokes. Beets. Broccoli. Cabbage. Carrots. Eggplant. Green beans. Chard. Kale. Spinach. Onions. Leeks. Peas. Squash.  Tomatoes. Peppers. Radishes. ? Fruits: Apples. Apricots. Avocado. Berries. Bananas. Cherries. Dates. Figs. Grapes. Lemons. Melon. Oranges. Peaches. Plums. Pomegranate. ? Meats and other protein foods: Beans. Almonds. Sunflower seeds. Pine nuts. Peanuts. Cod. Salmon. Scallops. Shrimp. Tuna. Tilapia. Clams. Oysters. Eggs. ? Dairy: Low-fat milk. Cheese. Greek yogurt. ? Beverages: Water. Red wine. Herbal tea. ? Fats and oils: Extra virgin olive oil. Avocado oil. Grape seed oil. ? Sweets and desserts: [place] yogurt with honey. Baked apples. Poached pears. Trail mix. ? Seasoning and other foods: Basil. Cilantro. Coriander. Cumin. Mint. Parsley. Sage. Rosemary. Tarragon. Garlic. Oregano. Thyme. Pepper. Balsalmic vinegar. Tahini. Hummus. Tomato sauce. Olives. Mushrooms. ? Limit these ? Grains: Prepackaged pasta or rice dishes. Prepackaged cereal with added sugar. ? Vegetables: Deep fried potatoes (french fries). ? Fruits: Fruit canned in syrup. ? Meats and other protein foods: Beef. Pork. Lamb. Poultry with skin. Hot dogs. Tomasa BlaseBacon. ? Dairy: Ice cream. Sour cream. Whole milk. ? Beverages: Juice. Sugar-sweetened soft drinks. Beer. Liquor and spirits. ? Fats and oils: Butter. Canola oil. Vegetable oil. Beef fat (tallow). Lard. ? Sweets and desserts: Cookies. Cakes. Pies. Candy. ? Seasoning and other foods: Mayonnaise. Premade sauces and marinades. ? The items listed may not be a complete list. Talk with your dietitian about what dietary choices are right for you. Summary  The Mediterranean diet includes both food and lifestyle choices.  Eat a variety of fresh fruits and vegetables, beans, nuts, seeds, and whole grains.  Limit the amount of red meat and sweets that you eat.  Talk with your health care provider about whether it is safe for you to drink red wine in moderation. This means 1 glass a day for nonpregnant women and 2 glasses a day for men. A glass of wine equals 5 oz (150 mL). This information  is not intended to replace advice given to you by your health care provider. Make sure you discuss any questions you have with your health care provider. Document Released: 11/11/2015 Document Revised: 12/14/2015 Document Reviewed: 11/11/2015 Elsevier Interactive Patient Education  2018 [employer]  Continue all medications as directed. Please call Dr. Sallee ProvencalNasher's office and make appt ASAP, re: increased shortness of breath with exertion. Dr. Deatra RobinsonNasher [facility] [address], [place], [place]  [address] Phone: [phone](336) [phone]; Fax: [phone] Please return in the next few weeks for fasting labs and 1-2 months for complete physical. Continue excellent water intake and follow Mediterranean diet. Decrease meal portion size. WELCOME TO THE PRACTICE!

## 2017-11-08 ENCOUNTER — Other Ambulatory Visit: Payer: Self-pay | Admitting: Adult Health

## 2017-11-08 DIAGNOSIS — E2839 Other primary ovarian failure: Secondary | ICD-10-CM

## 2017-11-09 ENCOUNTER — Other Ambulatory Visit (INDEPENDENT_AMBULATORY_CARE_PROVIDER_SITE_OTHER): Payer: Medicare Other

## 2017-11-09 DIAGNOSIS — I10 Essential (primary) hypertension: Secondary | ICD-10-CM

## 2017-11-09 DIAGNOSIS — E785 Hyperlipidemia, unspecified: Secondary | ICD-10-CM

## 2017-11-09 DIAGNOSIS — Z0000 Encounter for general adult medical examination without abnormal findings: Secondary | ICD-10-CM

## 2017-11-09 DIAGNOSIS — Z1159 Encounter for screening for other viral diseases: Secondary | ICD-10-CM

## 2017-11-10 LAB — CBC WITH DIFFERENTIAL/PLATELET
Basophils Absolute: 0 10*3/uL (ref 0.0–0.2)
Basos: 0 %
EOS (ABSOLUTE): 0.3 10*3/uL (ref 0.0–0.4)
Eos: 6 %
Hematocrit: 34 % (ref 34.0–46.6)
Hemoglobin: 10.7 g/dL — ABNORMAL LOW (ref 11.1–15.9)
Immature Grans (Abs): 0 10*3/uL (ref 0.0–0.1)
Immature Granulocytes: 0 %
Lymphocytes Absolute: 1.2 10*3/uL (ref 0.7–3.1)
Lymphs: 23 %
MCH: 27.1 pg (ref 26.6–33.0)
MCHC: 31.5 g/dL (ref 31.5–35.7)
MCV: 86 fL (ref 79–97)
Monocytes Absolute: 0.6 10*3/uL (ref 0.1–0.9)
Monocytes: 12 %
Neutrophils Absolute: 2.9 10*3/uL (ref 1.4–7.0)
Neutrophils: 59 %
Platelets: 388 10*3/uL (ref 150–450)
RBC: 3.95 x10E6/uL (ref 3.77–5.28)
RDW: 16.2 % — ABNORMAL HIGH (ref 12.3–15.4)
WBC: 4.9 10*3/uL (ref 3.4–10.8)

## 2017-11-10 LAB — HEMOGLOBIN A1C
Est. average glucose Bld gHb Est-mCnc: 117 mg/dL
Hgb A1c MFr Bld: 5.7 % — ABNORMAL HIGH (ref 4.8–5.6)

## 2017-11-10 LAB — LIPID PANEL
Chol/HDL Ratio: 4.7 ratio — ABNORMAL HIGH (ref 0.0–4.4)
Cholesterol, Total: 193 mg/dL (ref 100–199)
HDL: 41 mg/dL (ref >39)
LDL Calculated: 133 mg/dL — ABNORMAL HIGH (ref 0–99)
Triglycerides: 93 mg/dL (ref 0–149)
VLDL Cholesterol Cal: 19 mg/dL (ref 5–40)

## 2017-11-10 LAB — COMPREHENSIVE METABOLIC PANEL
ALT: 8 IU/L (ref 0–32)
AST: 15 IU/L (ref 0–40)
Albumin/Globulin Ratio: 1.2 (ref 1.2–2.2)
Albumin: 3.8 g/dL (ref 3.5–4.8)
Alkaline Phosphatase: 102 IU/L (ref 39–117)
BUN/Creatinine Ratio: 22 (ref 12–28)
BUN: 24 mg/dL (ref 8–27)
Bilirubin Total: 0.5 mg/dL (ref 0.0–1.2)
CO2: 24 mmol/L (ref 20–29)
Calcium: 9.7 mg/dL (ref 8.7–10.3)
Chloride: 104 mmol/L (ref 96–106)
Creatinine, Ser: 1.07 mg/dL — ABNORMAL HIGH (ref 0.57–1.00)
GFR calc Af Amer: 58 mL/min/{1.73_m2} — ABNORMAL LOW (ref >59)
GFR calc non Af Amer: 51 mL/min/{1.73_m2} — ABNORMAL LOW (ref >59)
Globulin, Total: 3.1 g/dL (ref 1.5–4.5)
Glucose: 89 mg/dL (ref 65–99)
Potassium: 4.7 mmol/L (ref 3.5–5.2)
Sodium: 142 mmol/L (ref 134–144)
Total Protein: 6.9 g/dL (ref 6.0–8.5)

## 2017-11-10 LAB — HEPATITIS C ANTIBODY: Hep C Virus Ab: 0.1 s/co ratio (ref 0.0–0.9)

## 2017-11-10 LAB — TSH: TSH: 1.39 u[IU]/mL (ref 0.450–4.500)

## 2017-11-13 ENCOUNTER — Other Ambulatory Visit: Payer: Self-pay | Admitting: Adult Health

## 2017-11-13 MED ORDER — FERROUS SULFATE 325 (65 FE) MG PO TABS: 325.0000 mg | ORAL_TABLET | Freq: Every day | ORAL | 2 refills | Status: DC

## 2017-11-20 ENCOUNTER — Other Ambulatory Visit: Payer: Self-pay | Admitting: Adult Health

## 2017-11-20 DIAGNOSIS — E785 Hyperlipidemia, unspecified: Secondary | ICD-10-CM

## 2017-11-20 DIAGNOSIS — I10 Essential (primary) hypertension: Secondary | ICD-10-CM

## 2017-11-20 MED ORDER — HYDROCHLOROTHIAZIDE 25 MG PO TABS: 25.0000 mg | ORAL_TABLET | Freq: Every day | ORAL | 0 refills | Status: DC

## 2017-11-20 MED ORDER — FLUOXETINE HCL 10 MG PO CAPS: 10.0000 mg | ORAL_CAPSULE | Freq: Every day | ORAL | 0 refills | Status: DC

## 2017-11-20 MED ORDER — ATORVASTATIN CALCIUM 40 MG PO TABS: ORAL_TABLET | ORAL | 0 refills | Status: DC

## 2017-11-20 MED ORDER — OMEPRAZOLE 20 MG PO CPDR: 20.0000 mg | DELAYED_RELEASE_CAPSULE | Freq: Every day | ORAL | 0 refills | Status: DC

## 2017-11-20 MED ORDER — CARVEDILOL 6.25 MG PO TABS: 6.2500 mg | ORAL_TABLET | Freq: Two times a day (BID) | ORAL | 0 refills | Status: DC

## 2017-11-20 MED ORDER — AMLODIPINE BESYLATE 2.5 MG PO TABS: 2.5000 mg | ORAL_TABLET | Freq: Every evening | ORAL | 0 refills | Status: DC

## 2017-11-20 MED ORDER — FLUOXETINE HCL 20 MG PO CAPS: 20.0000 mg | ORAL_CAPSULE | Freq: Every day | ORAL | 0 refills | Status: DC

## 2017-11-20 NOTE — Telephone Encounter (Signed)
We have not prescribed these medications for the patient previously.  Please review and refill if appropriate.  T. Nelson, CMA  

## 2017-11-20 NOTE — Telephone Encounter (Signed)
Patient called request (90 days/ 3 month) supply on Rx refill on Multiple Rx meds :  1)-- amLODipine (NORVASC) 2.5 MG tablet [[id number]][[id number]]   Order Details  Dose, Route, Frequency: As Directed Note to Pharmacy:  Please call our office to schedule an yearly appointment before anymore refills. [phone]. Thank you 1st attempt    Dispense Quantity: 30 tablet Refills: 0 Fills remaining      2)-- atorvastatin (LIPITOR) 40 MG tablet [[id number]][[id number]]   Order Details  Dose, Route, Frequency: As Directed Note to Pharmacy:  Please call our office to schedule an yearly appointment before anymore refills. [phone]. Thank you 1st attempt    Dispense Quantity: 30 tablet Refills: 0 Fills remaining: --      3)-- carvedilol (COREG) 6.25 MG tablet [[id number]][[id number]]   Order Details  Dose: 6.25 mg Route: Oral Frequency: 2 times daily  Note to Pharmacy:  Please call our office to schedule an yearly appointment before anymore refills. [phone]. Thank you 1st attempt      Dispense Quantity: 60 tablet Refills: 0 Fills remaining: --        Sig: Take 1 tablet (6.25 mg total) by mouth 2 (two) times daily.     4)-- FLUoxetine (PROZAC) 20 MG capsule [[id number]][[id number]]   Order Details  Dose: 20 mg Route: Oral Frequency: Daily  Dispense Quantity: -- Refills: -- Fills remaining: --        Sig: Take 20 mg by mouth daily.          5)-- FLUoxetine (PROZAC) 10 MG capsule [[id number]][[id number]]   Order Details  Dose: 10 mg Route: Oral Frequency: Daily  Dispense Quantity: -- Refills: -- Fills remaining: --        Sig: Take 10 mg by mouth daily.          6)-- omeprazole (PRILOSEC) 20 MG capsule [[id number]][[id number]]   Order Details  Dose, Route, Frequency: As Directed Note to Pharmacy:  Please call our office to schedule an yearly appointment before anymore refills. [phone]. Thank you 1st attempt    Dispense Quantity: 30 capsule Refills: 0 Fills remaining: --        Sig: TAKE 1 CAPSULE BY MOUTH DAILY  Patient  taking differently: TAKE 20 MG BY MOUTH DAILY          7)-- hydrochlorothiazide (HYDRODIURIL) 25 MG tablet [[id number]][[id number]]   Order Details  Dose: 1 tablet Route: Oral Frequency: Daily  Dispense Quantity: -- Refills: -- Fills remaining: --        Sig: Take 1 tablet by mouth daily.          Please send Rx refill order to:  [facility] [place] - [id number] [place] [phone] (Phone) [phone])   --Forwarding request to medical assistant.  --Fausto Skillernglh

## 2017-12-11 ENCOUNTER — Encounter: Payer: Self-pay | Admitting: Cardiology

## 2017-12-11 ENCOUNTER — Ambulatory Visit (INDEPENDENT_AMBULATORY_CARE_PROVIDER_SITE_OTHER): Payer: Medicare Other | Admitting: Cardiology

## 2017-12-11 VITALS — BP 120/70 | HR 77 | Ht 64.5 in | Wt 230.0 lb

## 2017-12-11 DIAGNOSIS — E669 Obesity, unspecified: Secondary | ICD-10-CM

## 2017-12-11 DIAGNOSIS — I10 Essential (primary) hypertension: Secondary | ICD-10-CM | POA: Diagnosis not present

## 2017-12-11 DIAGNOSIS — R0600 Dyspnea, unspecified: Secondary | ICD-10-CM

## 2017-12-11 DIAGNOSIS — R0609 Other forms of dyspnea: Secondary | ICD-10-CM | POA: Diagnosis not present

## 2017-12-11 DIAGNOSIS — R0789 Other chest pain: Secondary | ICD-10-CM

## 2017-12-11 DIAGNOSIS — E785 Hyperlipidemia, unspecified: Secondary | ICD-10-CM

## 2017-12-11 DIAGNOSIS — I350 Nonrheumatic aortic (valve) stenosis: Secondary | ICD-10-CM

## 2017-12-11 NOTE — Progress Notes (Signed)
Cardiology Office Note:    Date:  12/11/2017   ID:  Tracey Walton, DOB 1940/08/25, MRN [id number]  PCP:  Julaine Fusi, NP  Cardiologist:  Kristeen Miss, MD  Referring MD: Julaine Fusi, NP   Chief Complaint  Patient presents with  . Shortness of Breath    History of Present Illness:    Tracey Walton is a 77 y.o. female with a past medical history significant for hypertension, polymyalgia, hyperlipidemia, HH, GERD and mild AS.  She had shoulder surgery this summer. She has noted being more short of breath this summer. She is dyspneic walking up hill and she could barely make it off the beach when she went on vacation. She has mild chest heaviness with exertion that resolves when she sits down to relax. She denies palpitations. She has occ dizziness upon standing when her BP is low around 100. She drinks water and it improves. She had one episode of near syncope a few weeks ago when outside in the heat with the kids. She sleeps in a recliner due to back pain. She has occ ankle swelling like when she went to the beach, none now.   She has had long standing DOE but she notes that it has worsened as of this summer. Her daughter lives with her. She watches her 2 1/2 yr old grandson. She does all of her housework and cooking. She does occ yardwork, less lately due to her shoulder. She did push mow a small area a few days ago. She just takes frequent breaks.   She has never smoked or drank alcohol.   Home BPs  100-125/70's. Occ up to 150's/80's.   Past Medical History:  Diagnosis Date  . Anxiety   . Arthritis    "qwhere" (08/09/2017)  . Chest pain 2011   negative for MI  . Chronic kidney disease    renal insufficiency. 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 Chronic lower back pain   . Depression   . Dyspnea    SOB while walking  . GERD (gastroesophageal reflux disease)   . Heart murmur   . History of hiatal hernia   . Hyperlipidemia   .  Hypertension   . Pneumonia ~ 2014  . Polymyalgia (HCC)   . Strep throat     Past Surgical History:  Procedure Laterality Date  . ABDOMINAL HYSTERECTOMY     partial  . BILATERAL CARPAL TUNNEL RELEASE  2003-2005   right-left  . CATARACT EXTRACTION W/ INTRAOCULAR LENS  IMPLANT, BILATERAL Bilateral   . JOINT REPLACEMENT    . REVERSE SHOULDER ARTHROPLASTY Left 10/12/2016   Procedure: REVERSE LEFT SHOULDER ARTHROPLASTY;  Surgeon: Francena Hanly, MD;  Location: [facility] OR;  Service: Orthopedics;  Laterality: Left;  . REVERSE SHOULDER ARTHROPLASTY Right 08/09/2017   Procedure: RIGHT REVERSE SHOULDER ARTHROPLASTY;  Surgeon: Francena Hanly, MD;  Location: [facility] OR;  Service: Orthopedics;  Laterality: Right;  . SHOULDER ARTHROSCOPY Right 2003  . TOOTH EXTRACTION     "1 tooth" (08/09/2017)  . TOTAL KNEE ARTHROPLASTY Bilateral     Current Medications: Current Meds  Medication Sig  . acetaminophen (TYLENOL) 325 MG tablet Take 650 mg by mouth 4 (four) times daily as needed (for pain.).  Marland Kitchen amLODipine (NORVASC) 2.5 MG tablet Take 1 tablet (2.5 mg total) by mouth every evening.  Marland Kitchen atorvastatin (LIPITOR) 40 MG tablet TAKE 1 TABLET BY MOUTH  DAILY AT 6 PM.  . carvedilol (  COREG) 6.25 MG tablet Take 1 tablet (6.25 mg total) by mouth 2 (two) times daily.  . cetirizine (ZYRTEC) 10 MG tablet Take 10 mg by mouth daily.  . ferrous sulfate 325 (65 FE) MG tablet Take 1 tablet (325 mg total) by mouth daily with breakfast. Take on Monday, Wednesday, Fridays  . FLUoxetine (PROZAC) 10 MG capsule Take 1 capsule (10 mg total) by mouth daily.  Marland Kitchen FLUoxetine (PROZAC) 20 MG capsule Take 1 capsule (20 mg total) by mouth daily.  . fluticasone (FLONASE) 50 MCG/ACT nasal spray Place 2 sprays into both nostrils daily as needed for allergies.  . hydrochlorothiazide (HYDRODIURIL) 25 MG tablet Take 1 tablet (25 mg total) by mouth daily.  . naproxen (NAPROSYN) 500 MG tablet Take 1 tablet (500 mg total) by mouth 2 (two) times daily with a  meal.  . omeprazole (PRILOSEC) 20 MG capsule Take 1 capsule (20 mg total) by mouth daily.  Bertram Gala Glycol-Propyl Glycol (LUBRICANT EYE DROPS) 0.4-0.3 % SOLN Place 1 drop into both eyes 3 (three) times daily as needed (for dry/irritated eyes.).  Marland Kitchen traMADol (ULTRAM) 50 MG tablet Take 1 tablet (50 mg total) by mouth every 6 (six) hours as needed for moderate pain (for pain.).     Allergies:   Penicillins   Social History   Socioeconomic History  . Marital status: Widowed    Spouse name: Not on file  . Number of children: Not on file  . Years of education: Not on file  . Highest education level: Not on file  Occupational History  . Not on file  Social Needs  . Financial resource strain: Not on file  . Food insecurity:    Worry: Not on file    Inability: Not on file  . Transportation needs:    Medical: Not on file    Non-medical: Not on file  Tobacco Use  . Smoking status: Never Smoker  . Smokeless tobacco: Never Used  Substance and Sexual Activity  . Alcohol use: Never    Frequency: Never  . Drug use: Never  . Sexual activity: Not Currently    Birth control/protection: None  Lifestyle  . Physical activity:    Days per week: Not on file    Minutes per session: Not on file  . Stress: Not on file  Relationships  . Social connections:    Talks on phone: Not on file    Gets together: Not on file    Attends religious service: Not on file    Active member of club or organization: Not on file    Attends meetings of clubs or organizations: Not on file    Relationship status: Not on file  Other Topics Concern  . Not on file  Social History Narrative  . Not on file     Family History: The patient's family history includes Heart attack in her mother; Heart disease in her mother; Heart failure in her father; Hyperlipidemia in her father and sister; Hypertension in her father and sister; Kidney failure in her father; Stroke in her father. ROS:   Please see the history of  present illness.     All other systems reviewed and are negative.  EKGs/Labs/Other Studies Reviewed:    The following studies were reviewed today:  Echocardiogram 06/19/14 Study Conclusions - Left ventricle: The cavity size was normal. Systolic function was normal. The estimated ejection fraction was in the range of 55% to 60%. Wall motion was normal; there were no regional wall motion  abnormalities. Doppler parameters are consistent with abnormal left ventricular relaxation (grade 1 diastolic dysfunction). - Aortic valve: Cusp separation was reduced. There was very mild stenosis. There was trivial regurgitation. Mean gradient (S): 11 mm Hg. - Mitral valve: There was mild regurgitation.  Lexiscan myoview 06/18/17 Overall Impression:  Normal stress nuclear study. Overall low risk study with no areas of significant ischemia identified. Sensitivity and specificity of study are reduced by breast attenuation as well as bowel loop attenuation artifact.  LV Ejection Fraction: 54%.  LV Wall Motion:  There is hypokinesis of the base to mid inferior wall. This may be artifactual however given attenuation.  EKG:  EKG is ordered today.  The ekg ordered today demonstrates NSR without ischemic changes. 77 bpm, QTC 427  Recent Labs: 11/09/2017: ALT 8; BUN 24; Creatinine, Ser 1.07; Hemoglobin 10.7; Platelets 388; Potassium 4.7; Sodium 142; TSH 1.390   Recent Lipid Panel    Component Value Date/Time   CHOL 193 11/09/2017 0935   TRIG 93 11/09/2017 0935   HDL 41 11/09/2017 0935   CHOLHDL 4.7 (H) 11/09/2017 0935   CHOLHDL 3.6 10/01/2015 0746   VLDL 14 10/01/2015 0746   LDLCALC 133 (H) 11/09/2017 0935    Physical Exam:    VS:  BP 120/70   Pulse 77   Ht 5' 4.5" (1.638 m)   Wt 230 lb (104.3 kg)   SpO2 96%   BMI 38.87 kg/m     Wt Readings from Last 3 Encounters:  12/11/17 230 lb (104.3 kg)  11/07/17 236 lb (107 kg)  08/09/17 229 lb 15 oz (104.3 kg)     Physical Exam    Constitutional: She is oriented to person, place, and time. She appears well-developed and well-nourished. No distress.  Obese  HENT:  Head: Normocephalic and atraumatic.  Neck: Normal range of motion. Neck supple. No JVD present.  Cardiovascular: Normal rate and regular rhythm.  Murmur heard.  Harsh midsystolic murmur is present with a grade of 3/6 at the upper right sternal border. Pulmonary/Chest: Effort normal and breath sounds normal. No respiratory distress. She has no wheezes. She has no rales.  Abdominal: Soft. Bowel sounds are normal.  Musculoskeletal: Normal range of motion. She exhibits no edema or deformity.  Neurological: She is alert and oriented to person, place, and time.  Skin: Skin is warm and dry.  Psychiatric: She has a normal mood and affect. Her behavior is normal. Judgment and thought content normal.  Vitals reviewed.    ASSESSMENT:    1. Dyspnea on exertion   2. Chest pressure   3. Aortic valve stenosis, etiology of cardiac valve disease unspecified   4. Hypertension, unspecified type   5. Hyperlipidemia, unspecified hyperlipidemia type   6. Obesity with body mass index of 30.0-39.9    PLAN:    In order of problems listed above:  DOE/chest heaviness with exertion: Pt with long standing DOE, recently worsened over the summer. Pt cares for herself but is not very active with recent shoulder surgery imposing further limitations on activity. Mild exertioanl chest heaviness. Will check Lexiscan Myoview.   Aortic stenosis: stable with mild AS on echo in 2011 and 2016. 3/6 SEM present. Will check echocardiogram for LV function and valve status.   Hypertension: well controlled on current regimen. Occ lightheadedness with SBP ~100 improves with drinking water. Continue current therapy for now. Will follow up after above testing. IF AS worse may have to decrease antihypertensive meds.   Hyperlipidemia: On atorvastatin 40 mg. LDL  was 166 in 2016. Most recent LDL  133 11/09/2017. Await above testing. She would benefit from a lower LDL. Consider increasing statin or adding Zetia. I am not sure she would tolerate increased statin with her polymyalgias.   Obesity: Body mass index is 38.87 kg/m. Excess wt is likely contributing to her lack of mobility and activity intolerance. Advised on DASH diet and reducing intake of processed foods and bread.     Medication Adjustments/Labs and Tests Ordered: Current medicines are reviewed at length with the patient today.  Concerns regarding medicines are outlined above. Labs and tests ordered and medication changes are outlined in the patient instructions below:  Patient Instructions  Medication Instructions: Your physician recommends that you continue on your current medications as directed. Please refer to the Current Medication list given to you today.   Labwork: None  Procedures/Testing: Your physician has requested that you have a lexiscan myoview. For further information please visit [email or web address] Please follow instruction sheet, as given.   Your physician has requested that you have an echocardiogram. Echocardiography is a painless test that uses sound waves to create images of your heart. It provides your doctor with information about the size and shape of your heart and how well your heart's chambers and valves are working. This procedure takes approximately one hour. There are no restrictions for this procedure.    Follow-Up: Your physician recommends that you schedule a follow-up appointment in: 1 month with Lizabeth Leyden NP   Any Additional Special Instructions Will Be Listed Below (If Applicable).    DASH Eating Plan DASH stands for "Dietary Approaches to Stop Hypertension." The DASH eating plan is a healthy eating plan that has been shown to reduce high blood pressure (hypertension). It may also reduce your risk for type 2 diabetes, heart disease, and stroke. The DASH eating plan may also  help with weight loss. What are tips for following this plan? General guidelines  Avoid eating more than 2,300 mg (milligrams) of salt (sodium) a day. If you have hypertension, you may need to reduce your sodium intake to 1,500 mg a day.  Limit alcohol intake to no more than 1 drink a day for nonpregnant women and 2 drinks a day for men. One drink equals 12 oz of beer, 5 oz of wine, or 1 oz of hard liquor.  Work with your health care provider to maintain a healthy body weight or to lose weight. Ask what an ideal weight is for you.  Get at least 30 minutes of exercise that causes your heart to beat faster (aerobic exercise) most days of the week. Activities may include walking, swimming, or biking.  Work with your health care provider or diet and nutrition specialist (dietitian) to adjust your eating plan to your individual calorie needs. Reading food labels  Check food labels for the amount of sodium per serving. Choose foods with less than 5 percent of the Daily Value of sodium. Generally, foods with less than 300 mg of sodium per serving fit into this eating plan.  To find whole grains, look for the word "whole" as the first word in the ingredient list. Shopping  Buy products labeled as "low-sodium" or "no salt added."  Buy fresh foods. Avoid canned foods and premade or frozen meals. Cooking  Avoid adding salt when cooking. Use salt-free seasonings or herbs instead of table salt or sea salt. Check with your health care provider or pharmacist before using salt substitutes.  Do not fry foods. Cook foods using  healthy methods such as baking, boiling, grilling, and broiling instead.  Cook with heart-healthy oils, such as olive, canola, soybean, or sunflower oil. Meal planning   Eat a balanced diet that includes: ? 5 or more servings of fruits and vegetables each day. At each meal, try to fill half of your plate with fruits and vegetables. ? Up to 6-8 servings of whole grains each  day. ? Less than 6 oz of lean meat, poultry, or fish each day. A 3-oz serving of meat is about the same size as a deck of cards. One egg equals 1 oz. ? 2 servings of low-fat dairy each day. ? A serving of nuts, seeds, or beans 5 times each week. ? Heart-healthy fats. Healthy fats called Omega-3 fatty acids are found in foods such as flaxseeds and coldwater fish, like sardines, salmon, and mackerel.  Limit how much you eat of the following: ? Canned or prepackaged foods. ? Food that is high in trans fat, such as fried foods. ? Food that is high in saturated fat, such as fatty meat. ? Sweets, desserts, sugary drinks, and other foods with added sugar. ? Full-fat dairy products.  Do not salt foods before eating.  Try to eat at least 2 vegetarian meals each week.  Eat more home-cooked food and less restaurant, buffet, and fast food.  When eating at a restaurant, ask that your food be prepared with less salt or no salt, if possible. What foods are recommended? The items listed may not be a complete list. Talk with your dietitian about what dietary choices are best for you. Grains Whole-grain or whole-wheat bread. Whole-grain or whole-wheat pasta. Brown rice. Orpah Cobb. Bulgur. Whole-grain and low-sodium cereals. Pita bread. Low-fat, low-sodium crackers. Whole-wheat flour tortillas. Vegetables Fresh or frozen vegetables (raw, steamed, roasted, or grilled). Low-sodium or reduced-sodium tomato and vegetable juice. Low-sodium or reduced-sodium tomato sauce and tomato paste. Low-sodium or reduced-sodium canned vegetables. Fruits All fresh, dried, or frozen fruit. Canned fruit in natural juice (without added sugar). Meat and other protein foods Skinless chicken or [place]. Ground chicken or [place]. Pork with fat trimmed off. Fish and seafood. Egg whites. Dried beans, peas, or lentils. Unsalted nuts, nut butters, and seeds. Unsalted canned beans. Lean cuts of beef with fat trimmed off.  Low-sodium, lean deli meat. Dairy Low-fat (1%) or fat-free (skim) milk. Fat-free, low-fat, or reduced-fat cheeses. Nonfat, low-sodium ricotta or cottage cheese. Low-fat or nonfat yogurt. Low-fat, low-sodium cheese. Fats and oils Soft margarine without trans fats. Vegetable oil. Low-fat, reduced-fat, or light mayonnaise and salad dressings (reduced-sodium). Canola, safflower, olive, soybean, and sunflower oils. Avocado. Seasoning and other foods Herbs. Spices. Seasoning mixes without salt. Unsalted popcorn and pretzels. Fat-free sweets. What foods are not recommended? The items listed may not be a complete list. Talk with your dietitian about what dietary choices are best for you. Grains Baked goods made with fat, such as croissants, muffins, or some breads. Dry pasta or rice meal packs. Vegetables Creamed or fried vegetables. Vegetables in a cheese sauce. Regular canned vegetables (not low-sodium or reduced-sodium). Regular canned tomato sauce and paste (not low-sodium or reduced-sodium). Regular tomato and vegetable juice (not low-sodium or reduced-sodium). Rosita Fire. Olives. Fruits Canned fruit in a light or heavy syrup. Fried fruit. Fruit in cream or butter sauce. Meat and other protein foods Fatty cuts of meat. Ribs. Fried meat. Tomasa Blase. Sausage. Bologna and other processed lunch meats. Salami. Fatback. Hotdogs. Bratwurst. Salted nuts and seeds. Canned beans with added salt. Canned or smoked fish.  Whole eggs or egg yolks. Chicken or [place] with skin. Dairy Whole or 2% milk, cream, and half-and-half. Whole or full-fat cream cheese. Whole-fat or sweetened yogurt. Full-fat cheese. Nondairy creamers. Whipped toppings. Processed cheese and cheese spreads. Fats and oils Butter. Stick margarine. Lard. Shortening. Ghee. Bacon fat. Tropical oils, such as coconut, palm kernel, or palm oil. Seasoning and other foods Salted popcorn and pretzels. Onion salt, garlic salt, seasoned salt, table salt, and sea  salt. Worcestershire sauce. Tartar sauce. Barbecue sauce. Teriyaki sauce. Soy sauce, including reduced-sodium. Steak sauce. Canned and packaged gravies. Fish sauce. Oyster sauce. Cocktail sauce. Horseradish that you find on the shelf. Ketchup. Mustard. Meat flavorings and tenderizers. Bouillon cubes. Hot sauce and Tabasco sauce. Premade or packaged marinades. Premade or packaged taco seasonings. Relishes. Regular salad dressings. Where to find more information:  National Heart, Lung, and Blood Institute: PopSteam.is  American Heart Association: www.heart.org Summary  The DASH eating plan is a healthy eating plan that has been shown to reduce high blood pressure (hypertension). It may also reduce your risk for type 2 diabetes, heart disease, and stroke.  With the DASH eating plan, you should limit salt (sodium) intake to 2,300 mg a day. If you have hypertension, you may need to reduce your sodium intake to 1,500 mg a day.  When on the DASH eating plan, aim to eat more fresh fruits and vegetables, whole grains, lean proteins, low-fat dairy, and heart-healthy fats.  Work with your health care provider or diet and nutrition specialist (dietitian) to adjust your eating plan to your individual calorie needs. This information is not intended to replace advice given to you by your health care provider. Make sure you discuss any questions you have with your health care provider. Document Released: 03/09/2011 Document Revised: 03/13/2016 Document Reviewed: 03/13/2016 Elsevier Interactive Patient Education  [employer].   If you need a refill on your cardiac medications before your next appointment, please call your pharmacy.      Signed, Berton Bon, NP  12/11/2017 4:33 PM    [facility]

## 2017-12-11 NOTE — Patient Instructions (Signed)
Medication Instructions: Your physician recommends that you continue on your current medications as directed. Please refer to the Current Medication list given to you today.   Labwork: None  Procedures/Testing: Your physician has requested that you have a lexiscan myoview. For further information please visit [email or web address] Please follow instruction sheet, as given.   Your physician has requested that you have an echocardiogram. Echocardiography is a painless test that uses sound waves to create images of your heart. It provides your doctor with information about the size and shape of your heart and how well your heart's chambers and valves are working. This procedure takes approximately one hour. There are no restrictions for this procedure.    Follow-Up: Your physician recommends that you schedule a follow-up appointment in: 1 month with Lizabeth Leyden NP   Any Additional Special Instructions Will Be Listed Below (If Applicable).    DASH Eating Plan DASH stands for "Dietary Approaches to Stop Hypertension." The DASH eating plan is a healthy eating plan that has been shown to reduce high blood pressure (hypertension). It may also reduce your risk for type 2 diabetes, heart disease, and stroke. The DASH eating plan may also help with weight loss. What are tips for following this plan? General guidelines  Avoid eating more than 2,300 mg (milligrams) of salt (sodium) a day. If you have hypertension, you may need to reduce your sodium intake to 1,500 mg a day.  Limit alcohol intake to no more than 1 drink a day for nonpregnant women and 2 drinks a day for men. One drink equals 12 oz of beer, 5 oz of wine, or 1 oz of hard liquor.  Work with your health care provider to maintain a healthy body weight or to lose weight. Ask what an ideal weight is for you.  Get at least 30 minutes of exercise that causes your heart to beat faster (aerobic exercise) most days of the week. Activities  may include walking, swimming, or biking.  Work with your health care provider or diet and nutrition specialist (dietitian) to adjust your eating plan to your individual calorie needs. Reading food labels  Check food labels for the amount of sodium per serving. Choose foods with less than 5 percent of the Daily Value of sodium. Generally, foods with less than 300 mg of sodium per serving fit into this eating plan.  To find whole grains, look for the word "whole" as the first word in the ingredient list. Shopping  Buy products labeled as "low-sodium" or "no salt added."  Buy fresh foods. Avoid canned foods and premade or frozen meals. Cooking  Avoid adding salt when cooking. Use salt-free seasonings or herbs instead of table salt or sea salt. Check with your health care provider or pharmacist before using salt substitutes.  Do not fry foods. Cook foods using healthy methods such as baking, boiling, grilling, and broiling instead.  Cook with heart-healthy oils, such as olive, canola, soybean, or sunflower oil. Meal planning   Eat a balanced diet that includes: ? 5 or more servings of fruits and vegetables each day. At each meal, try to fill half of your plate with fruits and vegetables. ? Up to 6-8 servings of whole grains each day. ? Less than 6 oz of lean meat, poultry, or fish each day. A 3-oz serving of meat is about the same size as a deck of cards. One egg equals 1 oz. ? 2 servings of low-fat dairy each day. ? A serving of nuts, seeds, or  beans 5 times each week. ? Heart-healthy fats. Healthy fats called Omega-3 fatty acids are found in foods such as flaxseeds and coldwater fish, like sardines, salmon, and mackerel.  Limit how much you eat of the following: ? Canned or prepackaged foods. ? Food that is high in trans fat, such as fried foods. ? Food that is high in saturated fat, such as fatty meat. ? Sweets, desserts, sugary drinks, and other foods with added sugar. ? Full-fat  dairy products.  Do not salt foods before eating.  Try to eat at least 2 vegetarian meals each week.  Eat more home-cooked food and less restaurant, buffet, and fast food.  When eating at a restaurant, ask that your food be prepared with less salt or no salt, if possible. What foods are recommended? The items listed may not be a complete list. Talk with your dietitian about what dietary choices are best for you. Grains Whole-grain or whole-wheat bread. Whole-grain or whole-wheat pasta. Brown rice. Orpah Cobb. Bulgur. Whole-grain and low-sodium cereals. Pita bread. Low-fat, low-sodium crackers. Whole-wheat flour tortillas. Vegetables Fresh or frozen vegetables (raw, steamed, roasted, or grilled). Low-sodium or reduced-sodium tomato and vegetable juice. Low-sodium or reduced-sodium tomato sauce and tomato paste. Low-sodium or reduced-sodium canned vegetables. Fruits All fresh, dried, or frozen fruit. Canned fruit in natural juice (without added sugar). Meat and other protein foods Skinless chicken or [place]. Ground chicken or [place]. Pork with fat trimmed off. Fish and seafood. Egg whites. Dried beans, peas, or lentils. Unsalted nuts, nut butters, and seeds. Unsalted canned beans. Lean cuts of beef with fat trimmed off. Low-sodium, lean deli meat. Dairy Low-fat (1%) or fat-free (skim) milk. Fat-free, low-fat, or reduced-fat cheeses. Nonfat, low-sodium ricotta or cottage cheese. Low-fat or nonfat yogurt. Low-fat, low-sodium cheese. Fats and oils Soft margarine without trans fats. Vegetable oil. Low-fat, reduced-fat, or light mayonnaise and salad dressings (reduced-sodium). Canola, safflower, olive, soybean, and sunflower oils. Avocado. Seasoning and other foods Herbs. Spices. Seasoning mixes without salt. Unsalted popcorn and pretzels. Fat-free sweets. What foods are not recommended? The items listed may not be a complete list. Talk with your dietitian about what dietary choices are best  for you. Grains Baked goods made with fat, such as croissants, muffins, or some breads. Dry pasta or rice meal packs. Vegetables Creamed or fried vegetables. Vegetables in a cheese sauce. Regular canned vegetables (not low-sodium or reduced-sodium). Regular canned tomato sauce and paste (not low-sodium or reduced-sodium). Regular tomato and vegetable juice (not low-sodium or reduced-sodium). Rosita Fire. Olives. Fruits Canned fruit in a light or heavy syrup. Fried fruit. Fruit in cream or butter sauce. Meat and other protein foods Fatty cuts of meat. Ribs. Fried meat. Tomasa Blase. Sausage. Bologna and other processed lunch meats. Salami. Fatback. Hotdogs. Bratwurst. Salted nuts and seeds. Canned beans with added salt. Canned or smoked fish. Whole eggs or egg yolks. Chicken or [place] with skin. Dairy Whole or 2% milk, cream, and half-and-half. Whole or full-fat cream cheese. Whole-fat or sweetened yogurt. Full-fat cheese. Nondairy creamers. Whipped toppings. Processed cheese and cheese spreads. Fats and oils Butter. Stick margarine. Lard. Shortening. Ghee. Bacon fat. Tropical oils, such as coconut, palm kernel, or palm oil. Seasoning and other foods Salted popcorn and pretzels. Onion salt, garlic salt, seasoned salt, table salt, and sea salt. Worcestershire sauce. Tartar sauce. Barbecue sauce. Teriyaki sauce. Soy sauce, including reduced-sodium. Steak sauce. Canned and packaged gravies. Fish sauce. Oyster sauce. Cocktail sauce. Horseradish that you find on the shelf. Ketchup. Mustard. Meat flavorings and tenderizers. Bouillon  cubes. Hot sauce and Tabasco sauce. Premade or packaged marinades. Premade or packaged taco seasonings. Relishes. Regular salad dressings. Where to find more information:  National Heart, Lung, and Blood Institute: PopSteam.is  American Heart Association: www.heart.org Summary  The DASH eating plan is a healthy eating plan that has been shown to reduce high blood pressure  (hypertension). It may also reduce your risk for type 2 diabetes, heart disease, and stroke.  With the DASH eating plan, you should limit salt (sodium) intake to 2,300 mg a day. If you have hypertension, you may need to reduce your sodium intake to 1,500 mg a day.  When on the DASH eating plan, aim to eat more fresh fruits and vegetables, whole grains, lean proteins, low-fat dairy, and heart-healthy fats.  Work with your health care provider or diet and nutrition specialist (dietitian) to adjust your eating plan to your individual calorie needs. This information is not intended to replace advice given to you by your health care provider. Make sure you discuss any questions you have with your health care provider. Document Released: 03/09/2011 Document Revised: 03/13/2016 Document Reviewed: 03/13/2016 Elsevier Interactive Patient Education  [employer].   If you need a refill on your cardiac medications before your next appointment, please call your pharmacy.

## 2017-12-20 ENCOUNTER — Encounter (HOSPITAL_COMMUNITY): Payer: Medicare Other

## 2017-12-20 ENCOUNTER — Telehealth (HOSPITAL_COMMUNITY): Payer: Self-pay | Admitting: *Deleted

## 2017-12-20 ENCOUNTER — Other Ambulatory Visit (HOSPITAL_COMMUNITY): Payer: Medicare Other

## 2017-12-20 NOTE — Telephone Encounter (Signed)
Patient given detailed instructions per Myocardial Perfusion Study Information Sheet for the test on 12/27/17. Patient notified to arrive 15 minutes early and that it is imperative to arrive on time for appointment to keep from having the test rescheduled.  If you need to cancel or reschedule your appointment, please call the office within 24 hours of your appointment. . Patient verbalized understanding. Tifanie Gardiner Jacqueline     

## 2017-12-27 ENCOUNTER — Other Ambulatory Visit: Payer: Self-pay

## 2017-12-27 ENCOUNTER — Ambulatory Visit (HOSPITAL_BASED_OUTPATIENT_CLINIC_OR_DEPARTMENT_OTHER): Payer: Medicare Other

## 2017-12-27 ENCOUNTER — Ambulatory Visit (HOSPITAL_COMMUNITY): Payer: Medicare Other | Attending: Cardiology

## 2017-12-27 ENCOUNTER — Encounter (HOSPITAL_COMMUNITY): Payer: Medicare Other

## 2017-12-27 DIAGNOSIS — N189 Chronic kidney disease, unspecified: Secondary | ICD-10-CM | POA: Insufficient documentation

## 2017-12-27 DIAGNOSIS — Z6838 Body mass index (BMI) 38.0-38.9, adult: Secondary | ICD-10-CM | POA: Diagnosis not present

## 2017-12-27 DIAGNOSIS — I350 Nonrheumatic aortic (valve) stenosis: Secondary | ICD-10-CM

## 2017-12-27 DIAGNOSIS — E669 Obesity, unspecified: Secondary | ICD-10-CM | POA: Diagnosis not present

## 2017-12-27 DIAGNOSIS — I083 Combined rheumatic disorders of mitral, aortic and tricuspid valves: Secondary | ICD-10-CM | POA: Diagnosis not present

## 2017-12-27 DIAGNOSIS — R0600 Dyspnea, unspecified: Secondary | ICD-10-CM

## 2017-12-27 DIAGNOSIS — R0609 Other forms of dyspnea: Secondary | ICD-10-CM

## 2017-12-27 DIAGNOSIS — I129 Hypertensive chronic kidney disease with stage 1 through stage 4 chronic kidney disease, or unspecified chronic kidney disease: Secondary | ICD-10-CM | POA: Diagnosis not present

## 2017-12-27 DIAGNOSIS — R079 Chest pain, unspecified: Secondary | ICD-10-CM | POA: Diagnosis not present

## 2017-12-27 DIAGNOSIS — E785 Hyperlipidemia, unspecified: Secondary | ICD-10-CM | POA: Insufficient documentation

## 2017-12-27 DIAGNOSIS — R0789 Other chest pain: Secondary | ICD-10-CM | POA: Diagnosis not present

## 2017-12-27 LAB — ECHOCARDIOGRAM COMPLETE
Height: 64.5 in
Weight: 3680 oz

## 2017-12-27 MED ORDER — TECHNETIUM TC 99M TETROFOSMIN IV KIT
32.6000 | PACK | Freq: Once | INTRAVENOUS | Status: AC | PRN
  Administered 2017-12-27: 32.6 via INTRAVENOUS
  Filled 2017-12-27: qty 33

## 2017-12-27 MED ORDER — REGADENOSON 0.4 MG/5ML IV SOLN
0.4000 mg | Freq: Once | INTRAVENOUS | Status: AC
  Administered 2017-12-27: 0.4 mg via INTRAVENOUS

## 2017-12-28 ENCOUNTER — Other Ambulatory Visit (HOSPITAL_COMMUNITY): Payer: Medicare Other

## 2017-12-28 ENCOUNTER — Ambulatory Visit (HOSPITAL_COMMUNITY): Payer: Medicare Other | Attending: Cardiovascular Disease

## 2017-12-28 ENCOUNTER — Encounter (HOSPITAL_COMMUNITY): Payer: Medicare Other

## 2017-12-28 LAB — MYOCARDIAL PERFUSION IMAGING
LV dias vol: 111 mL (ref 46–106)
LV sys vol: 34 mL
Peak HR: 90 {beats}/min
RATE: 0.24
Rest HR: 61 {beats}/min
SDS: 1
SRS: 7
SSS: 8
TID: 0.88

## 2017-12-28 MED ORDER — TECHNETIUM TC 99M TETROFOSMIN IV KIT
32.1000 | PACK | Freq: Once | INTRAVENOUS | Status: AC | PRN
  Administered 2017-12-28: 32.1 via INTRAVENOUS
  Filled 2017-12-28: qty 33

## 2018-01-08 ENCOUNTER — Other Ambulatory Visit: Payer: Self-pay | Admitting: Adult Health

## 2018-01-08 DIAGNOSIS — I10 Essential (primary) hypertension: Secondary | ICD-10-CM

## 2018-01-08 DIAGNOSIS — E785 Hyperlipidemia, unspecified: Secondary | ICD-10-CM

## 2018-01-10 ENCOUNTER — Encounter: Payer: Self-pay | Admitting: Cardiology

## 2018-01-10 ENCOUNTER — Ambulatory Visit: Payer: Medicare Other | Admitting: Cardiology

## 2018-01-10 VITALS — BP 128/74 | HR 70 | Ht 64.5 in | Wt 228.8 lb

## 2018-01-10 DIAGNOSIS — R0609 Other forms of dyspnea: Secondary | ICD-10-CM | POA: Diagnosis not present

## 2018-01-10 DIAGNOSIS — E785 Hyperlipidemia, unspecified: Secondary | ICD-10-CM

## 2018-01-10 DIAGNOSIS — E669 Obesity, unspecified: Secondary | ICD-10-CM

## 2018-01-10 DIAGNOSIS — I10 Essential (primary) hypertension: Secondary | ICD-10-CM

## 2018-01-10 DIAGNOSIS — M79606 Pain in leg, unspecified: Secondary | ICD-10-CM | POA: Diagnosis not present

## 2018-01-10 DIAGNOSIS — I350 Nonrheumatic aortic (valve) stenosis: Secondary | ICD-10-CM

## 2018-01-10 DIAGNOSIS — R0600 Dyspnea, unspecified: Secondary | ICD-10-CM

## 2018-01-10 DIAGNOSIS — M79604 Pain in right leg: Secondary | ICD-10-CM | POA: Diagnosis not present

## 2018-01-10 DIAGNOSIS — M79605 Pain in left leg: Secondary | ICD-10-CM

## 2018-01-10 NOTE — Patient Instructions (Signed)
Medication Instructions:  Your physician recommends that you continue on your current medications as directed. Please refer to the Current Medication list given to you today.  If you need a refill on your cardiac medications before your next appointment, please call your pharmacy.   Lab work: None Ordered   If you have labs (blood work) drawn today and your tests are completely normal, you will receive your results only by: Marland Kitchen MyChart Message (if you have MyChart) OR . A paper copy in the mail If you have any lab test that is abnormal or we need to change your treatment, we will call you to review the results.  Testing/Procedures: Your physician has requested that you have a lower extremity arterial exercise duplex with ABI. During this test, exercise and ultrasound are used to evaluate arterial blood flow in the legs. Allow one hour for this exam. There are no restrictions or special instructions.   Follow-Up: At [facility] [place], you and your health needs are our priority.  As part of our continuing mission to provide you with exceptional heart care, we have created designated Provider Care Teams.  These Care Teams include your primary Cardiologist (physician) and Advanced Practice Providers (APPs -  Physician Assistants and Nurse Practitioners) who all work together to provide you with the care you need, when you need it. You will need a follow up appointment in:  12 months.  Please call our office 2 months in advance to schedule this appointment.  You may see Kristeen Miss, MD or one of the following Advanced Practice Providers on your designated Care Team: Tereso Newcomer, PA-C [place], [place] . Berton Bon, NP  Any Other Special Instructions Will Be Listed Below (If Applicable).

## 2018-01-10 NOTE — Progress Notes (Signed)
Cardiology Office Note:    Date:  01/10/2018   ID:  Tracey Walton, DOB 1940-10-08, MRN [id number]  PCP:  Julaine Fusi, NP  Cardiologist:  Kristeen Miss, MD  Referring MD: Julaine Fusi, NP   Chief Complaint  Patient presents with  . Follow-up    DOE    History of Present Illness:    Tracey Walton is a 77 y.o. female with a past medical history significant for hypertension, polymyalgia, hyperlipidemia, HH, GERD and mild AS.  I saw her in the office on 12/11/2017 at which time she had complaints of dyspnea on exertion that had worsened over the summer.  She also had mild exertional chest heaviness.  She underwent a Lexiscan Myoview which showed no evidence of ischemia and no evidence of previous infarction, normal EF.  Echocardiogram showed normal LV systolic function with EF 60-65%, no regional wall motion abnormalities, grade 2 diastolic dysfunction, mild aortic stenosis and mild aortic regurgitation, slight worsening of the aortic stenosis.  The studies did not reveal anything significant to explain her symptoms.  She was found to be anemic in August with Hgb 10.7 from previous 12.7. She has been started on iron and says that she is feeling much better since. She is still a little short of breath mostly with vacuuming. She is still caring for her 77 year old great grand son.  She has no chest pain/pressure/tightness.  She does have pain/numbness in her left leg with standing and walking.  It generally starts in her left thigh and moves down her entire leg.  It resolves with sitting down.  She has a scar on her right lower leg from an injury last year when a fan cut her leg.  She says that it took almost a year to heal.  Pedal pulses are +1 bilaterally  Past Medical History:  Diagnosis Date  . Anxiety   . Arthritis    "qwhere" (08/09/2017)  . Chest pain 2011   negative for MI  . Chronic kidney disease    renal insufficiency. 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 Chronic lower back pain   . Depression   . Dyspnea    SOB while walking  . GERD (gastroesophageal reflux disease)   . Heart murmur   . History of hiatal hernia   . Hyperlipidemia   . Hypertension   . Pneumonia ~ 2014  . Polymyalgia (HCC)   . Strep throat     Past Surgical History:  Procedure Laterality Date  . ABDOMINAL HYSTERECTOMY     partial  . BILATERAL CARPAL TUNNEL RELEASE  2003-2005   right-left  . CATARACT EXTRACTION W/ INTRAOCULAR LENS  IMPLANT, BILATERAL Bilateral   . JOINT REPLACEMENT    . REVERSE SHOULDER ARTHROPLASTY Left 10/12/2016   Procedure: REVERSE LEFT SHOULDER ARTHROPLASTY;  Surgeon: Francena Hanly, MD;  Location: [facility] OR;  Service: Orthopedics;  Laterality: Left;  . REVERSE SHOULDER ARTHROPLASTY Right 08/09/2017   Procedure: RIGHT REVERSE SHOULDER ARTHROPLASTY;  Surgeon: Francena Hanly, MD;  Location: [facility] OR;  Service: Orthopedics;  Laterality: Right;  . SHOULDER ARTHROSCOPY Right 2003  . TOOTH EXTRACTION     "1 tooth" (08/09/2017)  . TOTAL KNEE ARTHROPLASTY Bilateral     Current Medications: Current Meds  Medication Sig  . acetaminophen (TYLENOL) 325 MG tablet Take 650 mg by mouth 4 (four) times daily as needed (for pain.).  Marland Kitchen amLODipine (NORVASC) 2.5 MG tablet Take 1 tablet (  2.5 mg total) by mouth every evening.  Marland Kitchen atorvastatin (LIPITOR) 40 MG tablet TAKE 1 TABLET BY MOUTH  DAILY AT 6 PM.  . carvedilol (COREG) 6.25 MG tablet Take 1 tablet (6.25 mg total) by mouth 2 (two) times daily.  . cetirizine (ZYRTEC) 10 MG tablet Take 10 mg by mouth daily.  . ferrous sulfate 325 (65 FE) MG tablet Take 1 tablet (325 mg total) by mouth daily with breakfast. Take on Monday, Wednesday, Fridays  . FLUoxetine (PROZAC) 10 MG capsule Take 1 capsule (10 mg total) by mouth daily.  Marland Kitchen FLUoxetine (PROZAC) 20 MG capsule Take 1 capsule (20 mg total) by mouth daily.  . fluticasone (FLONASE) 50 MCG/ACT nasal spray Place 2 sprays into both nostrils daily as needed for  allergies.  . hydrochlorothiazide (HYDRODIURIL) 25 MG tablet Take 1 tablet (25 mg total) by mouth daily.  . naproxen (NAPROSYN) 500 MG tablet Take 1 tablet (500 mg total) by mouth 2 (two) times daily with a meal.  . omeprazole (PRILOSEC) 20 MG capsule Take 1 capsule (20 mg total) by mouth daily.  Bertram Gala Glycol-Propyl Glycol (LUBRICANT EYE DROPS) 0.4-0.3 % SOLN Place 1 drop into both eyes 3 (three) times daily as needed (for dry/irritated eyes.).  Marland Kitchen traMADol (ULTRAM) 50 MG tablet Take 1 tablet (50 mg total) by mouth every 6 (six) hours as needed for moderate pain (for pain.).     Allergies:   Penicillins   Social History   Socioeconomic History  . Marital status: Widowed    Spouse name: Not on file  . Number of children: Not on file  . Years of education: Not on file  . Highest education level: Not on file  Occupational History  . Not on file  Social Needs  . Financial resource strain: Not on file  . Food insecurity:    Worry: Not on file    Inability: Not on file  . Transportation needs:    Medical: Not on file    Non-medical: Not on file  Tobacco Use  . Smoking status: Never Smoker  . Smokeless tobacco: Never Used  Substance and Sexual Activity  . Alcohol use: Never    Frequency: Never  . Drug use: Never  . Sexual activity: Not Currently    Birth control/protection: None  Lifestyle  . Physical activity:    Days per week: Not on file    Minutes per session: Not on file  . Stress: Not on file  Relationships  . Social connections:    Talks on phone: Not on file    Gets together: Not on file    Attends religious service: Not on file    Active member of club or organization: Not on file    Attends meetings of clubs or organizations: Not on file    Relationship status: Not on file  Other Topics Concern  . Not on file  Social History Narrative  . Not on file     Family History: The patient's family history includes Heart attack in her mother; Heart disease in  her mother; Heart failure in her father; Hyperlipidemia in her father and sister; Hypertension in her father and sister; Kidney failure in her father; Stroke in her father. ROS:   Please see the history of present illness.     All other systems reviewed and are negative.  EKGs/Labs/Other Studies Reviewed:    The following studies were reviewed today:  Lexiscan Myoview 12/28/17 Study Highlights    Nuclear stress EF:  69%. The left ventricular ejection fraction is hyperdynamic (>65%).  There was no ST segment deviation noted during stress.  Defect 1: There is a medium fixed defect of mild severity present in the basal anteroseptal, mid anteroseptal and apical septal location. This is likely due to artifact. The LV contracts well in this region.  The study is normal. There is no evidence of ischemia. There is no evidence of previous infarction.  This is a low risk study.     Echocardiogram 12/27/2017 Study Conclusions - Left ventricle: The cavity size was normal. There was mild   concentric hypertrophy. Systolic function was normal. The   estimated ejection fraction was in the range of 60% to 65%. Wall   motion was normal; there were no regional wall motion   abnormalities. Features are consistent with a pseudonormal left   ventricular filling pattern, with concomitant abnormal relaxation   and increased filling pressure (grade 2 diastolic dysfunction). - Aortic valve: Noncoronary cusp mobility was severely restricted.   Transvalvular velocity was increased more than expected, due to   high cardiac output. There was mild stenosis. There was mild   regurgitation. - Mitral valve: Calcified annulus. There was mild to moderate   regurgitation directed posteriorly. - Left atrium: The atrium was mildly dilated.  Impressions: - There is mild worsening of aortic stenosis compared to 2017. The   increase in aortic valve gradients is at least in part due to a   higher cardiac  output.  EKG:  EKG is not ordered today.   Recent Labs: 11/09/2017: ALT 8; BUN 24; Creatinine, Ser 1.07; Hemoglobin 10.7; Platelets 388; Potassium 4.7; Sodium 142; TSH 1.390   Recent Lipid Panel    Component Value Date/Time   CHOL 193 11/09/2017 0935   TRIG 93 11/09/2017 0935   HDL 41 11/09/2017 0935   CHOLHDL 4.7 (H) 11/09/2017 0935   CHOLHDL 3.6 10/01/2015 0746   VLDL 14 10/01/2015 0746   LDLCALC 133 (H) 11/09/2017 0935    Physical Exam:    VS:  BP 128/74   Pulse 70   Ht 5' 4.5" (1.638 m)   Wt 228 lb 12.8 oz (103.8 kg)   SpO2 95%   BMI 38.67 kg/m     Wt Readings from Last 3 Encounters:  01/10/18 228 lb 12.8 oz (103.8 kg)  12/27/17 230 lb (104.3 kg)  12/11/17 230 lb (104.3 kg)     Physical Exam  Constitutional: She is oriented to person, place, and time.  Obese female, walks with a cane  HENT:  Head: Normocephalic and atraumatic.  Neck: Normal range of motion. Neck supple. No JVD present.  Cardiovascular: Normal rate, regular rhythm and intact distal pulses. Exam reveals no gallop and no friction rub.  Murmur heard. Pulmonary/Chest: Effort normal and breath sounds normal. No respiratory distress. She has no wheezes. She has no rales.  Abdominal: Soft. Bowel sounds are normal.  Musculoskeletal: She exhibits no edema.  Neurological: She is alert and oriented to person, place, and time.  Skin: Skin is warm and dry.  Psychiatric: She has a normal mood and affect. Her behavior is normal. Judgment and thought content normal.     ASSESSMENT:    1. Pain in both lower extremities   2. Pain of lower extremity, unspecified laterality    PLAN:    In order of problems listed above:  DOE: Normal echo except mild worsening of aortic stenosis. Lexiscan myoview without ischemia or scar. She may have some effects from diastolic  dysfunction but has no edema. Pt has anemia and breathing has improved on iron therapy.   Aortic stenosis: stable with mild AS on echo in 2011 and  2016. Slightly worse on recent echo but still mild. Mean gradient 22 mmHg.   Hypertension: well controlled on current regimen.   Hyperlipidemia: On atorvastatin 40 mg. Most recent LDL 133 11/09/2017.  She probably would not tolerate increased statin with her polymyalgias.   Obesity: Body mass index is 38.67 kg/m. Excess wt is likely contributing to her lack of mobility and activity intolerance. Advised on DASH diet and reducing intake of processed foods and bread.   Leg pain: left leg pain with standing for any length of time and with walking, she feels numbness and pain starting in the thigh and runs down her leg. Relieved with sitting down. Pt had slow healing wound on right lower leg that took almost a year to heal. DD include PAD vs peripheral neuropathy vs sciatica vs left hip arthritic pain.  Risk factors for PAD are HTN and HLD.  Will obtain a screening ABI  Medication Adjustments/Labs and Tests Ordered: Current medicines are reviewed at length with the patient today.  Concerns regarding medicines are outlined above. Labs and tests ordered and medication changes are outlined in the patient instructions below:  Patient Instructions  Medication Instructions:  Your physician recommends that you continue on your current medications as directed. Please refer to the Current Medication list given to you today.  If you need a refill on your cardiac medications before your next appointment, please call your pharmacy.   Lab work: None Ordered   If you have labs (blood work) drawn today and your tests are completely normal, you will receive your results only by: Marland Kitchen MyChart Message (if you have MyChart) OR . A paper copy in the mail If you have any lab test that is abnormal or we need to change your treatment, we will call you to review the results.  Testing/Procedures: Your physician has requested that you have a lower extremity arterial exercise duplex with ABI. During this test, exercise  and ultrasound are used to evaluate arterial blood flow in the legs. Allow one hour for this exam. There are no restrictions or special instructions.   Follow-Up: At [facility], you and your health needs are our priority.  As part of our continuing mission to provide you with exceptional heart care, we have created designated Provider Care Teams.  These Care Teams include your primary Cardiologist (physician) and Advanced Practice Providers (APPs -  Physician Assistants and Nurse Practitioners) who all work together to provide you with the care you need, when you need it. You will need a follow up appointment in:  12 months.  Please call our office 2 months in advance to schedule this appointment.  You may see Kristeen Miss, MD or one of the following Advanced Practice Providers on your designated Care Team: Tereso Newcomer, PA-C [place], [place] . Berton Bon, NP  Any Other Special Instructions Will Be Listed Below (If Applicable).       Signed, Berton Bon, NP  01/10/2018 4:10 PM    [facility]

## 2018-01-11 ENCOUNTER — Other Ambulatory Visit: Payer: Self-pay | Admitting: Cardiology

## 2018-01-11 DIAGNOSIS — I739 Peripheral vascular disease, unspecified: Secondary | ICD-10-CM

## 2018-01-14 ENCOUNTER — Ambulatory Visit (HOSPITAL_COMMUNITY)
Admission: RE | Admit: 2018-01-14 | Discharge: 2018-01-14 | Disposition: A | Discharge status: Home / Self Care | Payer: Medicare Other | Source: Ambulatory Visit | Attending: Cardiovascular Disease | Admitting: Cardiovascular Disease

## 2018-01-14 DIAGNOSIS — M79606 Pain in leg, unspecified: Secondary | ICD-10-CM

## 2018-01-14 DIAGNOSIS — I739 Peripheral vascular disease, unspecified: Secondary | ICD-10-CM

## 2018-03-08 ENCOUNTER — Telehealth: Payer: Self-pay | Admitting: Adult Health

## 2018-03-08 DIAGNOSIS — I10 Essential (primary) hypertension: Secondary | ICD-10-CM

## 2018-03-08 DIAGNOSIS — E785 Hyperlipidemia, unspecified: Secondary | ICD-10-CM

## 2018-03-08 MED ORDER — HYDROCHLOROTHIAZIDE 25 MG PO TABS: 25.0000 mg | ORAL_TABLET | Freq: Every day | ORAL | 0 refills | Status: DC

## 2018-03-08 MED ORDER — ATORVASTATIN CALCIUM 40 MG PO TABS: ORAL_TABLET | ORAL | 0 refills | Status: DC

## 2018-03-08 MED ORDER — OMEPRAZOLE 20 MG PO CPDR: 20.0000 mg | DELAYED_RELEASE_CAPSULE | Freq: Every day | ORAL | 0 refills | Status: DC

## 2018-03-08 MED ORDER — CARVEDILOL 6.25 MG PO TABS: 6.2500 mg | ORAL_TABLET | Freq: Two times a day (BID) | ORAL | 0 refills | Status: DC

## 2018-03-08 MED ORDER — AMLODIPINE BESYLATE 2.5 MG PO TABS: 2.5000 mg | ORAL_TABLET | Freq: Every evening | ORAL | 0 refills | Status: DC

## 2018-03-08 MED ORDER — FLUOXETINE HCL 10 MG PO CAPS: 10.0000 mg | ORAL_CAPSULE | Freq: Every day | ORAL | 0 refills | Status: DC

## 2018-03-08 MED ORDER — FLUOXETINE HCL 20 MG PO CAPS: 20.0000 mg | ORAL_CAPSULE | Freq: Every day | ORAL | 0 refills | Status: DC

## 2018-03-08 MED ORDER — FLUTICASONE PROPIONATE 50 MCG/ACT NA SUSP: 2.0000 | Freq: Every day | NASAL | 1 refills | Status: DC | PRN

## 2018-03-08 NOTE — Telephone Encounter (Signed)
Patient called requested Rx refill on these meds :   1) ---- amLODipine (NORVASC) 2.5 MG tablet [[id number]][[id number]]   Order Details  Dose: 2.5 mg Route: Oral Frequency: Every evening  Note to Pharmacy:  Please call our office to schedule an yearly appointment before anymore refills. [phone]. Thank you 1st attempt      Dispense Quantity: 90 tablet Refills: 0 Fills remaining: --        Sig: Take 1 tablet (2.5 mg total) by mouth every evening.           2)---- atorvastatin (LIPITOR) 40 MG tablet [[id number]][[id number]]   Order Details  Dose, Route, Frequency: As Directed Note to Pharmacy:  Please call our office to schedule an yearly appointment before anymore refills. [phone]. Thank you 1st attempt    Dispense Quantity: 90 tablet Refills: 0 Fills remaining: --        Sig: TAKE 1 TABLET BY MOUTH DAILY AT 6 PM.      3) ----- carvedilol (COREG) 6.25 MG tablet [[id number]][[id number]]   Order Details  Dose: 6.25 mg Route: Oral Frequency: 2 times daily  Note to Pharmacy:  Please call our office to schedule an yearly appointment before anymore refills. [phone]. Thank you 1st attempt      Dispense Quantity: 180 tablet Refills: 0 Fills remaining: --        Sig: Take 1 tablet (6.25 mg total) by mouth 2 (two) times daily.           4)---- ferrous sulfate 325 (65 FE) MG tablet [[id number]][[id number]]   Order Details  Dose: 325 mg Route: Oral Frequency: Daily with breakfast  Dispense Quantity: 90 tablet Refills: 2 Fills remaining: --        Sig: Take 1 tablet (325 mg total) by mouth daily with breakfast. Take on Monday, Wednesday, Fridays      5) ---- FLUoxetine (PROZAC) 10 MG capsule [[id number]][[id number]]   Order Details  Dose: 10 mg Route: Oral Frequency: Daily  Dispense Quantity: 90 capsule Refills: 0 Fills remaining: --        Sig: Take 1 capsule (10 mg total) by mouth daily.            6) ---- FLUoxetine (PROZAC) 20 MG capsule [[id number]][[id number]]   Order Details  Dose: 20 mg Route: Oral Frequency:  Daily  Dispense Quantity: 90 capsule Refills: 0 Fills remaining: --        Sig: Take 1 capsule (20 mg total) by mouth daily.      7) --- fluticasone (FLONASE) 50 MCG/ACT nasal spray [[id number]][[id number]]   Order Details  Dose: 2 spray Route: Each Nare Frequency: Daily PRN for allergies  Dispense Quantity: -- Refills: -- Fills remaining: --        Sig: Place 2 sprays into both nostrils daily as needed for allergies.      8) ---- hydrochlorothiazide (HYDRODIURIL) 25 MG tablet [[id number]][[id number]]   Order Details  Dose: 25 mg Route: Oral Frequency: Daily  Dispense Quantity: 90 tablet Refills: 0 Fills remaining: --        Sig: Take 1 tablet (25 mg total) by mouth daily.      omeprazole (PRILOSEC) 20 MG capsule [[id number]][[id number]]   Order Details  Dose: 20 mg Route: Oral Frequency: Daily  Note to Pharmacy:  Please call our office to schedule an yearly appointment before anymore refills. [phone]. Thank you 1st attempt      Dispense Quantity: 90 capsule Refills: 0 Fills remaining: --  Sig: Take 1 capsule (20 mg total) by mouth daily.      9) ---- omeprazole (PRILOSEC) 20 MG capsule [[id number]]   Order Details  Dose: 20 mg Route: Oral Frequency: Daily  Note to Pharmacy:  Please call our office to schedule an yearly appointment before anymore refills. [phone]. Thank you 1st attempt      Dispense Quantity: 90 capsule Refills: 0 Fills remaining: --        Sig: Take 1 capsule (20 mg total) by mouth daily.     10) ---traMADol (ULTRAM) 50 MG tablet [[id number]]   Order Details  Dose: 50 mg Route: Oral Frequency: Every 6 hours PRN for moderate pain, for pain.  Dispense Quantity: 30 tablet Refills: 0 Fills remaining: --        Sig: Take 1 tablet (50 mg total) by mouth every 6 (six) hours as needed for moderate pain (for pain.).       Written Date: 08/10/17 Expiration Date: 02/06/18    Start Date: 08/10/17 End Date: --         Ordering Provider: Yevonne Pax DEA #:   [id number] NPI:  [id number]       11) ----- naproxen (NAPROSYN) 500 MG tablet [[id number]]   Order Details  Dose: 500 mg Route: Oral Frequency: 2 times daily with meals  Dispense Quantity: 60 tablet Refills: 1 Fills remaining: --        Sig: Take 1 tablet (500 mg total) by mouth 2 (two) times daily with a meal.       Written Date: 08/10/17 Expiration Date: 08/10/18    Start Date: 08/10/17 End Date: --         Ordering Provider: Ralene Bathe, PA-C DEA #:  [id number] NPI:  [id number]       -----Forwarding request to medical assistant to send to :   [facility] - [place], [place] - [address] [phone] (Phone) [phone] (Fax)   --glh

## 2018-03-12 NOTE — Progress Notes (Signed)
Subjective:    Patient ID: Tracey Walton, female    DOB: October 22, 1940, 77 y.o.   MRN: [id number]  HPI: 11/07/17 OV: Ms. Tracey Walton is here to establish as a new pt.  She is a pleasant 77 year old female.  PMH: HTN, HLD, GERD, Depression, Heart Murmur, CKD She had R shoulder surgery 08/07/2017- reports being "pretty inactive since then" She eats a diet high in saturated fat/sugar and denies any exercise due to "my arthritis everywhere". She reports dyspnea with exertion steadily worsening >12 months Reports sig dyspnea when walking from beach to car last week, est distance approx 100 yards Last OV with Dr. Melburn PopperNasher 09/2015, advised to f/u in 6 months- advised to be seen by cards ASAP She reports sleeping in a recliner due to lumbar back pain She is followed by Dr. 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 Tracey Walton is here for f/u: HTN, HLD, GERD, Depression She was advised to f/u in Oct 2019 for CPE- she did not She was seen by cardiology 12/11/17- for increased dyspnea with exertion 12/27/17 ECHO- Impressions: LV EF: 60% -   65% - There is mild worsening of aortic stenosis compared to 2017. The   increase in aortic valve gradients is at least in part due to a   higher cardiac output. She was seen again at [facility] 10/19- no adjustment to cardiac medications and advised to follow-up annually. We had a long discussion about medication refills-  Since she has re-established with cards they will manage her anti-hypertensives We will manage all other rx's She reports drinking plenty of water, has been reducing food portion size and has lost 5 lbs since Oct-great! She continues to abstain from tobacco/vape/ETOH use She continues to  experience back/leg pain- declined referral to PT or start water aerobics program She denies any regular exercise, but reports "runing around with my grandson" She reports stable mood on Fluoxetine 30mg , "has been on it for years", denies SE or thoughts of harming herself/others She denies CP/increase in dyspnea    Patient Care Team    Relationship Specialty Notifications Start End  Julaine Fusianford, Katy D, NP PCP - General Family Medicine  11/07/17   Nahser, Deloris PingPhilip J, MD PCP - Cardiology Cardiology Admissions 12/11/17     Patient Active Problem List   Diagnosis Date Noted  . Chronic lower back pain 03/14/2018  . Leg pain 01/10/2018  . Aortic stenosis 12/11/2017  . Depression 11/07/2017  . Healthcare maintenance 11/07/2017  . Dyspnea on exertion 11/07/2017  . Obesity with body mass index of 30.0-39.9 11/07/2017  . 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 HTN (hypertension) 05/29/2014  . Chest pressure 05/29/2014  . Hyperlipidemia 05/29/2014     Past Medical History:  Diagnosis Date  . Anxiety   . Arthritis    "qwhere" (08/09/2017)  . Chest pain 2011   negative for MI  . Chronic kidney disease    renal insufficiency. 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 Chronic lower back pain   . Depression   . Dyspnea    SOB while walking  . GERD (gastroesophageal reflux disease)   . Heart murmur   .  History of hiatal hernia   . Hyperlipidemia   . Hypertension   . Pneumonia ~ 2014  . Polymyalgia (HCC)   . Strep throat      Past Surgical History:  Procedure Laterality Date  . ABDOMINAL HYSTERECTOMY     partial  . BILATERAL CARPAL TUNNEL RELEASE  2003-2005   right-left  . CATARACT EXTRACTION W/ INTRAOCULAR LENS  IMPLANT, BILATERAL Bilateral   . JOINT REPLACEMENT    . REVERSE SHOULDER ARTHROPLASTY Left 10/12/2016   Procedure: REVERSE LEFT SHOULDER ARTHROPLASTY;  Surgeon: Francena Hanly, MD;  Location: [facility] OR;  Service: Orthopedics;  Laterality:  Left;  . REVERSE SHOULDER ARTHROPLASTY Right 08/09/2017   Procedure: RIGHT REVERSE SHOULDER ARTHROPLASTY;  Surgeon: Francena Hanly, MD;  Location: [facility] OR;  Service: Orthopedics;  Laterality: Right;  . SHOULDER ARTHROSCOPY Right 2003  . TOOTH EXTRACTION     "1 tooth" (08/09/2017)  . TOTAL KNEE ARTHROPLASTY Bilateral      Family History  Problem Relation Age of Onset  . Heart attack Mother   . Heart disease Mother   . Hypertension Father   . Heart failure Father   . Kidney failure Father   . Hyperlipidemia Father   . Stroke Father   . Hypertension Sister   . Hyperlipidemia Sister      Social History   Substance and Sexual Activity  Drug Use Never     Social History   Substance and Sexual Activity  Alcohol Use Never  . Frequency: Never     Social History   Tobacco Use  Smoking Status Never Smoker  Smokeless Tobacco Never Used     Outpatient Encounter Medications as of 03/14/2018  Medication Sig  . acetaminophen (TYLENOL) 325 MG tablet Take 650 mg by mouth 4 (four) times daily as needed (for pain.).  Marland Kitchen amLODipine (NORVASC) 2.5 MG tablet Take 1 tablet (2.5 mg total) by mouth every evening.  Marland Kitchen atorvastatin (LIPITOR) 40 MG tablet TAKE 1 TABLET BY MOUTH  DAILY AT 6 PM.  . carvedilol (COREG) 6.25 MG tablet Take 1 tablet (6.25 mg total) by mouth 2 (two) times daily.  . cetirizine (ZYRTEC) 10 MG tablet Take 10 mg by mouth daily.  . ferrous sulfate 325 (65 FE) MG tablet Take 1 tablet (325 mg total) by mouth daily with breakfast. Take on Monday, Wednesday, Fridays  . FLUoxetine (PROZAC) 10 MG capsule Take 1 capsule (10 mg total) by mouth daily.  Marland Kitchen FLUoxetine (PROZAC) 20 MG capsule Take 1 capsule (20 mg total) by mouth daily.  . fluticasone (FLONASE) 50 MCG/ACT nasal spray Place 2 sprays into both nostrils daily as needed for allergies.  . hydrochlorothiazide (HYDRODIURIL) 25 MG tablet Take 1 tablet (25 mg total) by mouth daily.  . naproxen (NAPROSYN) 500 MG tablet Take 1  tablet (500 mg total) by mouth 2 (two) times daily with a meal.  . omeprazole (PRILOSEC) 20 MG capsule Take 1 capsule (20 mg total) by mouth daily.  Bertram Gala Glycol-Propyl Glycol (LUBRICANT EYE DROPS) 0.4-0.3 % SOLN Place 1 drop into both eyes 3 (three) times daily as needed (for dry/irritated eyes.).  . [DISCONTINUED] atorvastatin (LIPITOR) 40 MG tablet TAKE 1 TABLET BY MOUTH  DAILY AT 6 PM.  . [DISCONTINUED] ferrous sulfate 325 (65 FE) MG tablet Take 1 tablet (325 mg total) by mouth daily with breakfast. Take on Monday, Wednesday, Fridays  . [DISCONTINUED] FLUoxetine (PROZAC) 10 MG capsule Take 1 capsule (10 mg total) by mouth daily.  . [DISCONTINUED] FLUoxetine (PROZAC) 20  MG capsule Take 1 capsule (20 mg total) by mouth daily.  . [DISCONTINUED] fluticasone (FLONASE) 50 MCG/ACT nasal spray Place 2 sprays into both nostrils daily as needed for allergies.  . [DISCONTINUED] naproxen (NAPROSYN) 500 MG tablet Take 1 tablet (500 mg total) by mouth 2 (two) times daily with a meal.  . [DISCONTINUED] omeprazole (PRILOSEC) 20 MG capsule Take 1 capsule (20 mg total) by mouth daily.  . [DISCONTINUED] traMADol (ULTRAM) 50 MG tablet Take 1 tablet (50 mg total) by mouth every 6 (six) hours as needed for moderate pain (for pain.).   No facility-administered encounter medications on file as of 03/14/2018.     Allergies: Penicillins  Body mass index is 37.84 kg/m.  Blood pressure 132/73, pulse 70, temperature 98.1 F (36.7 C), temperature source Oral, height 5' 4.5" (1.638 m), weight 223 lb 14.4 oz (101.6 kg), SpO2 98 %.  Review of Systems  Constitutional: Positive for fatigue. Negative for activity change, appetite change, chills, diaphoresis, fever and unexpected weight change.  Eyes: Negative for visual disturbance.  Respiratory: Negative for cough, chest tightness, shortness of breath, wheezing and stridor.   Cardiovascular: Negative for chest pain, palpitations and leg swelling.   Gastrointestinal: Positive for constipation. Negative for abdominal distention, abdominal pain, blood in stool, diarrhea, nausea and vomiting.  Genitourinary: Negative for difficulty urinating and frequency.  Musculoskeletal: Positive for arthralgias, back pain, gait problem, joint swelling, myalgias and neck pain.  Skin: Negative for color change, pallor, rash and wound.  Neurological: Negative for dizziness and headaches.  Hematological: Does not bruise/bleed easily.  Psychiatric/Behavioral: Negative for confusion, decreased concentration, dysphoric mood, hallucinations, self-injury, sleep disturbance and suicidal ideas. The patient is not nervous/anxious and is not hyperactive.        Objective:   Physical Exam Vitals signs and nursing note reviewed.  Constitutional:      General: She is not in acute distress.    Appearance: She is well-developed and well-nourished. She is not diaphoretic.  HENT:     Head: Normocephalic and atraumatic.     Right Ear: External ear normal.     Left Ear: External ear normal.     Nose: Nose normal.     Mouth/Throat:     Mouth: Oropharynx is clear and moist.  Eyes:     Extraocular Movements: EOM normal.     Conjunctiva/sclera: Conjunctivae normal.     Pupils: Pupils are equal, round, and reactive to light.  Cardiovascular:     Rate and Rhythm: Normal rate and regular rhythm.     Pulses: Intact distal pulses.     Heart sounds: Murmur present. No gallop.   Pulmonary:     Effort: Pulmonary effort is normal. No respiratory distress.     Breath sounds: Normal breath sounds. No stridor. No decreased breath sounds, wheezing, rhonchi or rales.     Comments: Sat 98% on RA and Sat 97% No acute respiratory distress noted Chest:     Chest wall: No tenderness.  Skin:    General: Skin is warm and dry.     Capillary Refill: Capillary refill takes less than 2 seconds.  Neurological:     Mental Status: She is oriented to person, place, and time.   Psychiatric:        Mood and Affect: Mood normal.        Behavior: Behavior normal.        Thought Content: Thought content normal.        Judgment: Judgment normal.  Assessment & Plan:   1. Essential hypertension   2. Hyperlipidemia   3. Healthcare maintenance   4. Chronic bilateral low back pain, unspecified whether sciatica present     Healthcare maintenance Continue all medications as directed. You have seen your Cardiologist twice this fall and they will continue your blood pressure medications- Amlodipine Carvedilol Hydrochlorothiazide  Per the cardiology note Oct 2019, they instructed that you call your pharmacy and have them request your blood pressure medication refills from - Dr. Deatra [facility] [address] [place], [place], [place] [address] Phone: [phone]; Fax: [phone] We have refilled all your other medications. Continue to drink plenty of water and follow Mediterranean diet. Exercise at least 10 mins day- seated exercises, walking, stretching. Follow-up in 3 months, sooner if needed.  HTN (hypertension) BP 132/73 Advised to call established cardiologist for RFs on amlodipine, carvedilol, HCTZ  Hyperlipidemia Advised to follow Mediterranean diet and increase daily exercise  The 10-year ASCVD risk score Denman George DC Montez Hageman., et al., 2013) is: 24.1%   Values used to calculate the score:     Age: 43 years     Sex: Female     Is Non-Hispanic African American: No     Diabetic: No     Tobacco smoker: No     Systolic Blood Pressure: 132 mmHg     Is BP treated: Yes     HDL Cholesterol: 41 mg/dL     Total Cholesterol: 193 mg/dL  Currently already on high dose statin  Chronic lower back pain Followed by Dr. Ethelene Hal Q3M    FOLLOW-UP:  Return in about 3 months (around 06/13/2018) for Regular Follow Up.

## 2018-03-14 ENCOUNTER — Encounter: Payer: Self-pay | Admitting: Adult Health

## 2018-03-14 ENCOUNTER — Ambulatory Visit (INDEPENDENT_AMBULATORY_CARE_PROVIDER_SITE_OTHER): Payer: Medicare Other | Admitting: Adult Health

## 2018-03-14 DIAGNOSIS — I10 Essential (primary) hypertension: Secondary | ICD-10-CM | POA: Diagnosis not present

## 2018-03-14 DIAGNOSIS — M545 Low back pain, unspecified: Secondary | ICD-10-CM

## 2018-03-14 DIAGNOSIS — G8929 Other chronic pain: Secondary | ICD-10-CM

## 2018-03-14 DIAGNOSIS — Z0000 Encounter for general adult medical examination without abnormal findings: Secondary | ICD-10-CM | POA: Diagnosis not present

## 2018-03-14 DIAGNOSIS — E785 Hyperlipidemia, unspecified: Secondary | ICD-10-CM | POA: Diagnosis not present

## 2018-03-14 MED ORDER — NAPROXEN 500 MG PO TABS: 500.0000 mg | ORAL_TABLET | Freq: Two times a day (BID) | ORAL | 1 refills | Status: DC

## 2018-03-14 MED ORDER — FLUOXETINE HCL 10 MG PO CAPS: 10.0000 mg | ORAL_CAPSULE | Freq: Every day | ORAL | 1 refills | Status: DC

## 2018-03-14 MED ORDER — FLUOXETINE HCL 20 MG PO CAPS: 20.0000 mg | ORAL_CAPSULE | Freq: Every day | ORAL | 1 refills | Status: DC

## 2018-03-14 MED ORDER — ATORVASTATIN CALCIUM 40 MG PO TABS: ORAL_TABLET | ORAL | 1 refills | Status: DC

## 2018-03-14 MED ORDER — OMEPRAZOLE 20 MG PO CPDR: 20.0000 mg | DELAYED_RELEASE_CAPSULE | Freq: Every day | ORAL | 1 refills | Status: DC

## 2018-03-14 MED ORDER — FERROUS SULFATE 325 (65 FE) MG PO TABS: 325.0000 mg | ORAL_TABLET | Freq: Every day | ORAL | 1 refills | Status: DC

## 2018-03-14 MED ORDER — FLUTICASONE PROPIONATE 50 MCG/ACT NA SUSP: 2.0000 | Freq: Every day | NASAL | 1 refills | Status: DC | PRN

## 2018-03-14 NOTE — Assessment & Plan Note (Signed)
>>  ASSESSMENT AND PLAN FOR HYPERTENSION WRITTEN ON 03/14/2018 10:12 AM BY DANFORD, KATY D, NP  BP 132/73 Advised to call established cardiologist for RFs on amlodipine, carvedilol, HCTZ

## 2018-03-14 NOTE — Assessment & Plan Note (Signed)
Continue all medications as directed. You have seen your Cardiologist twice this fall and they will continue your blood pressure medications- Amlodipine Carvedilol Hydrochlorothiazide  Per the cardiology note Oct 2019, they instructed that you call your pharmacy and have them request your blood pressure medication refills from - Dr. Deatra RobinsonNasher [facility] [address], [place], [place] [address] Phone: [phone]; Fax: [phone] We have refilled all your other medications. Continue to drink plenty of water and follow Mediterranean diet. Exercise at least 10 mins day- seated exercises, walking, stretching. Follow-up in 3 months, sooner if needed.

## 2018-03-14 NOTE — Assessment & Plan Note (Signed)
BP 132/73 Advised to call established cardiologist for RFs on amlodipine, carvedilol, HCTZ

## 2018-03-14 NOTE — Assessment & Plan Note (Addendum)
Advised to follow Mediterranean diet and increase daily exercise  The 10-year ASCVD risk score Denman George(Goff DC Montez HagemanJr., et al., 2013) is: 24.1%   Values used to calculate the score:     Age: [age] years     Sex: Female     Is Non-Hispanic African American: No     Diabetic: No     Tobacco smoker: No     Systolic Blood Pressure: 132 mmHg     Is BP treated: Yes     HDL Cholesterol: 41 mg/dL     Total Cholesterol: 193 mg/dL  Currently already on high dose statin

## 2018-03-14 NOTE — Patient Instructions (Addendum)
Mediterranean Diet A Mediterranean diet refers to food and lifestyle choices that are based on the traditions of countries located on the [employer]. This way of eating has been shown to help prevent certain conditions and improve outcomes for people who have chronic diseases, like kidney disease and heart disease. What are tips for following this plan? Lifestyle  Cook and eat meals together with your family, when possible.  Drink enough fluid to keep your urine clear or pale yellow.  Be physically active every day. This includes: ? Aerobic exercise like running or swimming. ? Leisure activities like gardening, walking, or housework.  Get 7-8 hours of sleep each night. Reading food labels  Check the serving size of packaged foods. For foods such as rice and pasta, the serving size refers to the amount of cooked product, not dry.  Check the total fat in packaged foods. Avoid foods that have saturated fat or trans fats.  Check the ingredients list for added sugars, such as corn syrup. Shopping  At the grocery store, buy most of your food from the areas near the walls of the store. This includes: ? Fresh fruits and vegetables (produce). ? Grains, beans, nuts, and seeds. Some of these may be available in unpackaged forms or large amounts (in bulk). ? Fresh seafood. ? Poultry and eggs. ? Low-fat dairy products.  Buy whole ingredients instead of prepackaged foods.  Buy fresh fruits and vegetables in-season from local farmers markets.  Buy frozen fruits and vegetables in resealable bags.  If you do not have access to quality fresh seafood, buy precooked frozen shrimp or canned fish, such as tuna, salmon, or sardines.  Buy small amounts of raw or cooked vegetables, salads, or olives from the deli or salad bar at your store.  Stock your pantry so you always have certain foods on hand, such as olive oil, canned tuna, canned tomatoes, rice, pasta, and beans. Cooking  Cook foods  with extra-virgin olive oil instead of using butter or other vegetable oils.  Have meat as a side dish, and have vegetables or grains as your main dish. This means having meat in small portions or adding small amounts of meat to foods like pasta or stew.  Use beans or vegetables instead of meat in common dishes like chili or lasagna.  Experiment with different cooking methods. Try roasting or broiling vegetables instead of steaming or sauteing them.  Add frozen vegetables to soups, stews, pasta, or rice.  Add nuts or seeds for added healthy fat at each meal. You can add these to yogurt, salads, or vegetable dishes.  Marinate fish or vegetables using olive oil, lemon juice, garlic, and fresh herbs. Meal planning  Plan to eat 1 vegetarian meal one day each week. Try to work up to 2 vegetarian meals, if possible.  Eat seafood 2 or more times a week.  Have healthy snacks readily available, such as: ? Vegetable sticks with hummus. ? [place] yogurt. ? Fruit and nut trail mix.  Eat balanced meals throughout the week. This includes: ? Fruit: 2-3 servings a day ? Vegetables: 4-5 servings a day ? Low-fat dairy: 2 servings a day ? Fish, poultry, or lean meat: 1 serving a day ? Beans and legumes: 2 or more servings a week ? Nuts and seeds: 1-2 servings a day ? Whole grains: 6-8 servings a day ? Extra-virgin olive oil: 3-4 servings a day  Limit red meat and sweets to only a few servings a month What are my food  choices?  Mediterranean diet ? Recommended ? Grains: Whole-grain pasta. Brown rice. Bulgar wheat. Polenta. Couscous. Whole-wheat bread. [place] Cobbatmeal. Quinoa. ? Vegetables: Artichokes. Beets. Broccoli. Cabbage. Carrots. Eggplant. Green beans. Chard. Kale. Spinach. Onions. Leeks. Peas. Squash. Tomatoes. Peppers. Radishes. ? Fruits: Apples. Apricots. Avocado. Berries. Bananas. Cherries. Dates. Figs. Grapes. Lemons. Melon. Oranges. Peaches. Plums. Pomegranate. ? Meats and other protein  foods: Beans. Almonds. Sunflower seeds. Pine nuts. Peanuts. Cod. Salmon. Scallops. Shrimp. Tuna. Tilapia. Clams. Oysters. Eggs. ? Dairy: Low-fat milk. Cheese. Greek yogurt. ? Beverages: Water. Red wine. Herbal tea. ? Fats and oils: Extra virgin olive oil. Avocado oil. Grape seed oil. ? Sweets and desserts: [place] yogurt with honey. Baked apples. Poached pears. Trail mix. ? Seasoning and other foods: Basil. Cilantro. Coriander. Cumin. Mint. Parsley. Sage. Rosemary. Tarragon. Garlic. Oregano. Thyme. Pepper. Balsalmic vinegar. Tahini. Hummus. Tomato sauce. Olives. Mushrooms. ? Limit these ? Grains: Prepackaged pasta or rice dishes. Prepackaged cereal with added sugar. ? Vegetables: Deep fried potatoes (french fries). ? Fruits: Fruit canned in syrup. ? Meats and other protein foods: Beef. Pork. Lamb. Poultry with skin. Hot dogs. Tomasa BlaseBacon. ? Dairy: Ice cream. Sour cream. Whole milk. ? Beverages: Juice. Sugar-sweetened soft drinks. Beer. Liquor and spirits. ? Fats and oils: Butter. Canola oil. Vegetable oil. Beef fat (tallow). Lard. ? Sweets and desserts: Cookies. Cakes. Pies. Candy. ? Seasoning and other foods: Mayonnaise. Premade sauces and marinades. ? The items listed may not be a complete list. Talk with your dietitian about what dietary choices are right for you. Summary  The Mediterranean diet includes both food and lifestyle choices.  Eat a variety of fresh fruits and vegetables, beans, nuts, seeds, and whole grains.  Limit the amount of red meat and sweets that you eat. This information is not intended to replace advice given to you by your health care provider. Make sure you discuss any questions you have with your health care provider. Document Released: 11/11/2015 Document Revised: 12/14/2015 Document Reviewed: 11/11/2015 Elsevier Interactive Patient Education  2018 [employer]  Continue all medications as directed. You have seen your Cardiologist twice this fall and they will  continue your blood pressure medications- Amlodipine Carvedilol Hydrochlorothiazide  Per the cardiology note Oct 2019, they instructed that you call your pharmacy and have them request your blood pressure medication refills from - [facility] [address], [place], [place] [address] Phone: [phone]; Fax: [phone]) [phone] We have refilled all your other medications. Continue to drink plenty of water and follow Mediterranean diet. Exercise at least 10 mins day- seated exercises, walking, stretching. Follow-up in 3 months, sooner if needed. NICE TO SEE YOU!

## 2018-03-14 NOTE — Assessment & Plan Note (Signed)
Followed by Dr. Ethelene Halamos Q3M

## 2018-03-18 ENCOUNTER — Other Ambulatory Visit: Payer: Self-pay

## 2018-04-08 ENCOUNTER — Telehealth: Payer: Self-pay | Admitting: Adult Health

## 2018-04-08 ENCOUNTER — Other Ambulatory Visit: Payer: Self-pay | Admitting: Adult Health

## 2018-04-08 DIAGNOSIS — E785 Hyperlipidemia, unspecified: Secondary | ICD-10-CM

## 2018-04-08 DIAGNOSIS — I10 Essential (primary) hypertension: Secondary | ICD-10-CM

## 2018-04-08 NOTE — Telephone Encounter (Signed)
Patient is requesting call back from nurse about med refill error, she did not go into detail about the issue, just wanted to speak with nurse. Please call back on home number

## 2018-04-08 NOTE — Telephone Encounter (Signed)
Discussed with pt when medications were refilled, which pharmacies they were sent and the need to request HCTZ, amlodpine and carvedilol from cardiologist, per William Hamburger, NP.  Tiajuana Amass, CMA

## 2018-04-17 ENCOUNTER — Other Ambulatory Visit: Payer: Self-pay | Admitting: Adult Health

## 2018-04-17 DIAGNOSIS — I10 Essential (primary) hypertension: Secondary | ICD-10-CM

## 2018-04-17 DIAGNOSIS — E785 Hyperlipidemia, unspecified: Secondary | ICD-10-CM

## 2018-05-31 ENCOUNTER — Other Ambulatory Visit: Payer: Self-pay | Admitting: *Deleted

## 2018-05-31 DIAGNOSIS — I10 Essential (primary) hypertension: Secondary | ICD-10-CM

## 2018-05-31 DIAGNOSIS — E785 Hyperlipidemia, unspecified: Secondary | ICD-10-CM

## 2018-05-31 MED ORDER — AMLODIPINE BESYLATE 2.5 MG PO TABS: 2.5000 mg | ORAL_TABLET | Freq: Every evening | ORAL | 1 refills | Status: DC

## 2018-05-31 MED ORDER — HYDROCHLOROTHIAZIDE 25 MG PO TABS: 25.0000 mg | ORAL_TABLET | Freq: Every day | ORAL | 1 refills | Status: DC

## 2018-05-31 MED ORDER — CARVEDILOL 6.25 MG PO TABS: 6.2500 mg | ORAL_TABLET | Freq: Two times a day (BID) | ORAL | 1 refills | Status: DC

## 2018-06-07 NOTE — Progress Notes (Signed)
Subjective:    Patient ID: Tracey Walton, female    DOB: [birth year], 78 y.o.   MRN: [id number]  HPI : 11/07/17 OV: Tracey Walton is here to establish as a new pt.  She is a pleasant 78 year old female.  PMH: HTN, HLD, GERD, Depression, Heart Murmur, CKD She had R shoulder surgery 08/07/2017- reports being "pretty inactive since then" She eats a diet high in saturated fat/sugar and denies any exercise due to "my arthritis everywhere". She reports dyspnea with exertion steadily worsening >12 months Reports sig dyspnea when walking [place] beach to car last week, est distance approx 100 yards Last OV with Dr. Melburn Popper 09/2015, advised to f/u in 6 months- advised to be seen by cards ASAP She reports sleeping in a recliner due to lumbar back pain She is followed by Dr. 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 The   increase in aortic valve gradients is at least in part due to a   higher cardiac output. She was seen again at cards 10/19- no adjustment to cardiac medications and advised to follow-up annually. We had a long discussion about medication refills-  Since she has re-established with cards they will manage her anti-hypertensives We will manage all other rx's She reports drinking plenty of water, has been reducing food portion size and has lost 5 lbs since Oct-great! She continues to abstain from tobacco/vape/ETOH use She continues to  experience back/leg pain- declined referral to PT or start water aerobics program She denies any regular exercise, but reports "runing around with my grandson" She reports stable mood on Fluoxetine 30mg , "has been on it for years", denies SE or thoughts of harming herself/others She denies CP/increase in dyspnea   06/10/2018 OV: Tracey Walton is here for 3 month f/u: Depression, Obesity, HTN, Chronic Pain, HLD She reports stable mood, denies SI/HI Agreeable to reducing Fluoxetine dosage from 30mg  to 20mg  QD- esp since she is using Naproxen for chronic pain (increases risk of Serotonin Syndrome) She denies CP/palpitations/increase in dyspnea She remains quite inactive, now especially with increase in R knee pain- has appt with Dr. Janett Labella [facility] 23 March She in non-fasting, needs to schedule lab appt to have lipid panel checked  Patient Care Team    Relationship Specialty Notifications Start End  Julaine Fusi, NP PCP - General Family Medicine  11/07/17   Nahser, Deloris Ping, MD PCP - Cardiology Cardiology Admissions 12/11/17     Patient Active Problem List   Diagnosis Date Noted  . Chronic lower back pain 03/14/2018  . Leg pain 01/10/2018  . Aortic stenosis 12/11/2017  . Depression 11/07/2017  . Healthcare maintenance 11/07/2017  . Dyspnea on exertion 11/07/2017  . Obesity with body mass index of 30.0-39.9 11/07/2017  . 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 HTN (hypertension) 05/29/2014  . Chest pressure 05/29/2014  . Hyperlipidemia 05/29/2014  Past Medical History:  Diagnosis Date  . Anxiety   . Arthritis    "qwhere" (08/09/2017)  . Chest pain 2011   negative for MI  . Chronic kidney disease    renal insufficiency. 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 Chronic lower back pain   . Depression   . Dyspnea    SOB while walking  . GERD (gastroesophageal reflux disease)   . Heart murmur   . History of hiatal hernia   .  Hyperlipidemia   . Hypertension   . Pneumonia ~ 2014  . Polymyalgia (HCC)   . Strep throat      Past Surgical History:  Procedure Laterality Date  . ABDOMINAL HYSTERECTOMY     partial  . BILATERAL CARPAL TUNNEL RELEASE  2003-2005   right-left  . CATARACT EXTRACTION W/ INTRAOCULAR LENS  IMPLANT, BILATERAL Bilateral   . JOINT REPLACEMENT    . REVERSE SHOULDER ARTHROPLASTY Left 10/12/2016   Procedure: REVERSE LEFT SHOULDER ARTHROPLASTY;  Surgeon: Francena Hanly, MD;  Location: [facility] OR;  Service: Orthopedics;  Laterality: Left;  . REVERSE SHOULDER ARTHROPLASTY Right 08/09/2017   Procedure: RIGHT REVERSE SHOULDER ARTHROPLASTY;  Surgeon: Francena Hanly, MD;  Location: [facility] OR;  Service: Orthopedics;  Laterality: Right;  . SHOULDER ARTHROSCOPY Right 2003  . TOOTH EXTRACTION     "1 tooth" (08/09/2017)  . TOTAL KNEE ARTHROPLASTY Bilateral      Family History  Problem Relation Age of Onset  . Heart attack Mother   . Heart disease Mother   . Hypertension Father   . Heart failure Father   . Kidney failure Father   . Hyperlipidemia Father   . Stroke Father   . Hypertension Sister   . Hyperlipidemia Sister      Social History   Substance and Sexual Activity  Drug Use Never     Social History   Substance and Sexual Activity  Alcohol Use Never  . Frequency: Never     Social History   Tobacco Use  Smoking Status Never Smoker  Smokeless Tobacco Never Used     Outpatient Encounter Medications as of 06/10/2018  Medication Sig  . acetaminophen (TYLENOL) 325 MG tablet Take 650 mg by mouth 4 (four) times daily as needed (for pain.).  Marland Kitchen amLODipine (NORVASC) 2.5 MG tablet Take 1 tablet (2.5 mg total) by mouth every evening.  Marland Kitchen atorvastatin (LIPITOR) 40 MG tablet TAKE 1 TABLET BY MOUTH  DAILY AT 6 PM.  . carvedilol (COREG) 6.25 MG tablet Take 1 tablet (6.25 mg total) by mouth 2 (two) times daily.  . cetirizine (ZYRTEC) 10 MG tablet Take 10 mg by mouth daily.  . ferrous sulfate 325 (65  FE) MG tablet Take 1 tablet (325 mg total) by mouth daily with breakfast. Take on Monday, Wednesday, Fridays  . FLUoxetine (PROZAC) 20 MG capsule Take 1 capsule (20 mg total) by mouth daily.  . fluticasone (FLONASE) 50 MCG/ACT nasal spray Place 2 sprays into both nostrils daily as needed for allergies.  . hydrochlorothiazide (HYDRODIURIL) 25 MG tablet Take 1 tablet (25 mg total) by mouth daily.  . naproxen (NAPROSYN) 500 MG tablet Take 1 tablet (500 mg total) by mouth 2 (two) times daily with a meal.  . omeprazole (PRILOSEC) 20 MG capsule Take 1 capsule (20 mg total) by mouth daily.  Bertram Gala Glycol-Propyl Glycol (LUBRICANT EYE DROPS) 0.4-0.3 % SOLN Place 1 drop into both eyes 3 (three) times daily as needed (for  dry/irritated eyes.).  . [DISCONTINUED] atorvastatin (LIPITOR) 40 MG tablet TAKE 1 TABLET BY MOUTH  DAILY AT 6 PM.  . [DISCONTINUED] FLUoxetine (PROZAC) 10 MG capsule Take 1 capsule (10 mg total) by mouth daily.   No facility-administered encounter medications on file as of 06/10/2018.     Allergies: Penicillins  Body mass index is 38.57 kg/m.  Blood pressure (!) 142/78, pulse 71, temperature 98.2 F (36.8 C), temperature source Oral, height 5' 4.5" (1.638 m), weight 228 lb 3.2 oz (103.5 kg), SpO2 95 %.  Review of Systems  Constitutional: Positive for fatigue. Negative for activity change, appetite change, chills, diaphoresis, fever and unexpected weight change.  Eyes: Negative for visual disturbance.  Respiratory: Negative for cough, chest tightness, shortness of breath, wheezing and stridor.   Cardiovascular: Negative for chest pain, palpitations and leg swelling.  Gastrointestinal: Negative for abdominal distention, abdominal pain, blood in stool, constipation, diarrhea, nausea and vomiting.  Genitourinary: Negative for difficulty urinating and frequency.  Musculoskeletal: Positive for arthralgias, back pain, gait problem, joint swelling, myalgias and neck pain.  Skin:  Negative for color change, pallor, rash and wound.  Neurological: Negative for dizziness and headaches.  Hematological: Does not bruise/bleed easily.  Psychiatric/Behavioral: Negative for confusion, decreased concentration, dysphoric mood, hallucinations, self-injury, sleep disturbance and suicidal ideas. The patient is not nervous/anxious and is not hyperactive.       Objective:   Physical Exam Vitals signs and nursing note reviewed.  Constitutional:      General: She is not in acute distress.    Appearance: She is well-developed. She is not diaphoretic.  HENT:     Head: Normocephalic and atraumatic.     Right Ear: External ear normal.     Left Ear: External ear normal.     Nose: Nose normal.  Eyes:     Conjunctiva/sclera: Conjunctivae normal.     Pupils: Pupils are equal, round, and reactive to light.  Cardiovascular:     Rate and Rhythm: Normal rate and regular rhythm.     Heart sounds: Murmur present. No gallop.   Pulmonary:     Effort: Pulmonary effort is normal. No respiratory distress.     Breath sounds: Normal breath sounds. No stridor. No decreased breath sounds, wheezing, rhonchi or rales.     Comments: Sat 98% on RA Chest:     Chest wall: No tenderness.  Skin:    General: Skin is warm and dry.     Capillary Refill: Capillary refill takes less than 2 seconds.  Neurological:     Mental Status: She is oriented to person, place, and time.  Psychiatric:        Mood and Affect: Mood normal.        Behavior: Behavior normal.        Thought Content: Thought content normal.        Judgment: Judgment normal.       Assessment & Plan:   1. Hyperlipidemia, unspecified hyperlipidemia type   2. Essential hypertension   3. Healthcare maintenance     HTN (hypertension) Continue Amlodipine 2.5mg  QD, Carvedilol 6.25mg  BID, HCTZ  QD Followed by [facility]  Hyperlipidemia Needs fasting labs Currently on Atorvastatin  QD Has sig polymyalgia's, unlikely to tolerate  increase of statin   Healthcare maintenance Continue all medications as directed, with one change- stop Fluoxetine . Only take Fluoxetine  once daily. Please schedule fasting lab appt in the next week. Remain well hydrated and follow heart healthy diet. Remain as active as possible.  Please keep appt with Orthopedic Specialist. Follow-up 3 months- Medicare Wellness.    FOLLOW-UP:  Return in about 3 months (around 09/10/2018) for Medical Wellness.

## 2018-06-10 ENCOUNTER — Encounter: Payer: Self-pay | Admitting: Adult Health

## 2018-06-10 ENCOUNTER — Ambulatory Visit (INDEPENDENT_AMBULATORY_CARE_PROVIDER_SITE_OTHER): Payer: Medicare Other | Admitting: Adult Health

## 2018-06-10 VITALS — BP 142/78 | HR 71 | Temp 98.2°F | Ht 64.5 in | Wt 228.2 lb

## 2018-06-10 DIAGNOSIS — E785 Hyperlipidemia, unspecified: Secondary | ICD-10-CM | POA: Diagnosis not present

## 2018-06-10 DIAGNOSIS — Z0000 Encounter for general adult medical examination without abnormal findings: Secondary | ICD-10-CM

## 2018-06-10 DIAGNOSIS — I10 Essential (primary) hypertension: Secondary | ICD-10-CM | POA: Diagnosis not present

## 2018-06-10 MED ORDER — ATORVASTATIN CALCIUM 40 MG PO TABS: ORAL_TABLET | ORAL | 2 refills | Status: DC

## 2018-06-10 NOTE — Assessment & Plan Note (Signed)
Needs fasting labs Currently on Atorvastatin 40mg  QD Has sig polymyalgia's, unlikely to tolerate increase of statin

## 2018-06-10 NOTE — Patient Instructions (Addendum)
Managing Your Hypertension Hypertension is commonly called high blood pressure. This is when the force of your blood pressing against the walls of your arteries is too strong. Arteries are blood vessels that carry blood from your heart throughout your body. Hypertension forces the heart to work harder to pump blood, and may cause the arteries to become narrow or stiff. Having untreated or uncontrolled hypertension can cause heart attack, stroke, kidney disease, and other problems. What are blood pressure readings? A blood pressure reading consists of a higher number over a lower number. Ideally, your blood pressure should be below 120/80. The first ("top") number is called the systolic pressure. It is a measure of the pressure in your arteries as your heart beats. The second ("bottom") number is called the diastolic pressure. It is a measure of the pressure in your arteries as the heart relaxes. What does my blood pressure reading mean? Blood pressure is classified into four stages. Based on your blood pressure reading, your health care provider may use the following stages to determine what type of treatment you need, if any. Systolic pressure and diastolic pressure are measured in a unit called mm Hg. Normal  Systolic pressure: below 120.  Diastolic pressure: below 80. Elevated  Systolic pressure: 120-129.  Diastolic pressure: below 80. Hypertension stage 1  Systolic pressure: 130-139.  Diastolic pressure: 80-89. Hypertension stage 2  Systolic pressure: 140 or above.  Diastolic pressure: 90 or above. What health risks are associated with hypertension? Managing your hypertension is an important responsibility. Uncontrolled hypertension can lead to:  A heart attack.  A stroke.  A weakened blood vessel (aneurysm).  Heart failure.  Kidney damage.  Eye damage.  Metabolic syndrome.  Memory and concentration problems. What changes can I make to manage my  hypertension? Hypertension can be managed by making lifestyle changes and possibly by taking medicines. Your health care provider will help you make a plan to bring your blood pressure within a normal range. Eating and drinking   Eat a diet that is high in fiber and potassium, and low in salt (sodium), added sugar, and fat. An example eating plan is called the DASH (Dietary Approaches to Stop Hypertension) diet. To eat this way: ? Eat plenty of fresh fruits and vegetables. Try to fill half of your plate at each meal with fruits and vegetables. ? Eat whole grains, such as whole wheat pasta, brown rice, or whole grain bread. Fill about one quarter of your plate with whole grains. ? Eat low-fat diary products. ? Avoid fatty cuts of meat, processed or cured meats, and poultry with skin. Fill about one quarter of your plate with lean proteins such as fish, chicken without skin, beans, eggs, and tofu. ? Avoid premade and processed foods. These tend to be higher in sodium, added sugar, and fat.  Reduce your daily sodium intake. Most people with hypertension should eat less than 1,500 mg of sodium a day.  Limit alcohol intake to no more than 1 drink a day for nonpregnant women and 2 drinks a day for men. One drink equals 12 oz of beer, 5 oz of wine, or 1 oz of hard liquor. Lifestyle  Work with your health care provider to maintain a healthy body weight, or to lose weight. Ask what an ideal weight is for you.  Get at least 30 minutes of exercise that causes your heart to beat faster (aerobic exercise) most days of the week. Activities may include walking, swimming, or biking.  Include exercise   to strengthen your muscles (resistance exercise), such as weight lifting, as part of your weekly exercise routine. Try to do these types of exercises for 30 minutes at least 3 days a week.  Do not use any products that contain nicotine or tobacco, such as cigarettes and e-cigarettes. If you need help quitting,  ask your health care provider.  Control any long-term (chronic) conditions you have, such as high cholesterol or diabetes. Monitoring  Monitor your blood pressure at home as told by your health care provider. Your personal target blood pressure may vary depending on your medical conditions, your age, and other factors.  Have your blood pressure checked regularly, as often as told by your health care provider. Working with your health care provider  Review all the medicines you take with your health care provider because there may be side effects or interactions.  Talk with your health care provider about your diet, exercise habits, and other lifestyle factors that may be contributing to hypertension.  Visit your health care provider regularly. Your health care provider can help you create and adjust your plan for managing hypertension. Will I need medicine to control my blood pressure? Your health care provider may prescribe medicine if lifestyle changes are not enough to get your blood pressure under control, and if:  Your systolic blood pressure is 130 or higher.  Your diastolic blood pressure is 80 or higher. Take medicines only as told by your health care provider. Follow the directions carefully. Blood pressure medicines must be taken as prescribed. The medicine does not work as well when you skip doses. Skipping doses also puts you at risk for problems. Contact a health care provider if:  You think you are having a reaction to medicines you have taken.  You have repeated (recurrent) headaches.  You feel dizzy.  You have swelling in your ankles.  You have trouble with your vision. Get help right away if:  You develop a severe headache or confusion.  You have unusual weakness or numbness, or you feel faint.  You have severe pain in your chest or abdomen.  You vomit repeatedly.  You have trouble breathing. Summary  Hypertension is when the force of blood pumping  through your arteries is too strong. If this condition is not controlled, it may put you at risk for serious complications.  Your personal target blood pressure may vary depending on your medical conditions, your age, and other factors. For most people, a normal blood pressure is less than 120/80.  Hypertension is managed by lifestyle changes, medicines, or both. Lifestyle changes include weight loss, eating a healthy, low-sodium diet, exercising more, and limiting alcohol. This information is not intended to replace advice given to you by your health care provider. Make sure you discuss any questions you have with your health care provider. Document Released: 12/13/2011 Document Revised: 02/16/2016 Document Reviewed: 02/16/2016 Elsevier Interactive Patient Education  2019 [employer].  Continue all medications as directed, with one change- stop Fluoxetine 10mg . Only take Fluoxetine 20mg  once daily. Please schedule fasting lab appt in the next week. Remain well hydrated and follow heart healthy diet. Remain as active as possible. Please keep appt with Orthopedic Specialist. Follow-up 3 months- Medicare [facility]. GREAT TO SEE YOU!

## 2018-06-10 NOTE — Assessment & Plan Note (Signed)
Continue all medications as directed, with one change- stop Fluoxetine 10mg . Only take Fluoxetine 20mg  once daily. Please schedule fasting lab appt in the next week. Remain well hydrated and follow heart healthy diet. Remain as active as possible. Please keep appt with Orthopedic Specialist. Follow-up 3 months- Medicare [facility].

## 2018-06-10 NOTE — Assessment & Plan Note (Signed)
>>  ASSESSMENT AND PLAN FOR HYPERTENSION WRITTEN ON 06/10/2018  4:55 PM BY DANFORD, KATY D, NP  Continue Amlodipine 2.5mg  QD, Carvedilol 6.25mg  BID, HCTZ 25mg  QD Followed by Cards

## 2018-06-10 NOTE — Assessment & Plan Note (Signed)
Continue Amlodipine 2.5mg  QD, Carvedilol 6.25mg  BID, HCTZ 25mg  QD Followed by Cards

## 2018-06-17 ENCOUNTER — Other Ambulatory Visit: Payer: Self-pay

## 2018-06-17 ENCOUNTER — Other Ambulatory Visit: Payer: Medicare Other

## 2018-06-17 DIAGNOSIS — I10 Essential (primary) hypertension: Secondary | ICD-10-CM

## 2018-06-17 DIAGNOSIS — E785 Hyperlipidemia, unspecified: Secondary | ICD-10-CM

## 2018-06-18 LAB — LIPID PANEL
Chol/HDL Ratio: 4.3 ratio (ref 0.0–4.4)
Cholesterol, Total: 169 mg/dL (ref 100–199)
HDL: 39 mg/dL — ABNORMAL LOW (ref >39)
LDL Calculated: 116 mg/dL — ABNORMAL HIGH (ref 0–99)
Triglycerides: 68 mg/dL (ref 0–149)
VLDL Cholesterol Cal: 14 mg/dL (ref 5–40)

## 2018-07-05 ENCOUNTER — Other Ambulatory Visit: Payer: Self-pay | Admitting: Adult Health

## 2018-07-08 ENCOUNTER — Other Ambulatory Visit: Payer: Medicare Other

## 2018-08-25 ENCOUNTER — Other Ambulatory Visit: Payer: Self-pay | Admitting: Adult Health

## 2018-08-25 DIAGNOSIS — E785 Hyperlipidemia, unspecified: Secondary | ICD-10-CM

## 2018-08-25 DIAGNOSIS — I10 Essential (primary) hypertension: Secondary | ICD-10-CM

## 2018-09-12 ENCOUNTER — Other Ambulatory Visit: Payer: Self-pay | Admitting: Cardiovascular Disease

## 2018-09-12 ENCOUNTER — Other Ambulatory Visit: Payer: Self-pay | Admitting: Adult Health

## 2018-09-12 ENCOUNTER — Telehealth: Payer: Self-pay

## 2018-09-12 DIAGNOSIS — I10 Essential (primary) hypertension: Secondary | ICD-10-CM

## 2018-09-12 DIAGNOSIS — E785 Hyperlipidemia, unspecified: Secondary | ICD-10-CM

## 2018-09-12 NOTE — Telephone Encounter (Signed)
Please call pt to schedule Medicare Wellness via telephone.  No further refills until has followed up with [facility].   Charyl Bigger, CMA

## 2018-09-13 ENCOUNTER — Encounter: Payer: Self-pay | Admitting: Physician Assistant

## 2018-09-13 ENCOUNTER — Ambulatory Visit
Admission: EM | Admit: 2018-09-13 | Discharge: 2018-09-13 | Disposition: A | Discharge status: Home / Self Care | Payer: Medicare Other | Attending: Physician Assistant | Admitting: Physician Assistant

## 2018-09-13 ENCOUNTER — Other Ambulatory Visit: Payer: Self-pay

## 2018-09-13 DIAGNOSIS — I10 Essential (primary) hypertension: Secondary | ICD-10-CM

## 2018-09-13 DIAGNOSIS — R21 Rash and other nonspecific skin eruption: Secondary | ICD-10-CM

## 2018-09-13 MED ORDER — VALACYCLOVIR HCL 1 G PO TABS: 1000.0000 mg | ORAL_TABLET | Freq: Three times a day (TID) | ORAL | 0 refills | Status: AC

## 2018-09-13 MED ORDER — TRIAMCINOLONE ACETONIDE 0.1 % EX CREA: 1.0000 "application " | TOPICAL_CREAM | Freq: Two times a day (BID) | CUTANEOUS | 0 refills | Status: DC

## 2018-09-13 NOTE — ED Provider Notes (Signed)
[facility]    CSN: [id number] Arrival date & time: 09/13/18  1003      History   Chief Complaint Chief Complaint  Patient presents with  . Rash    HPI Tracey Walton is a 78 y.o. female.   78 year old female comes in for 1 to 2-day history of rash to the back.  Patient states, area was itching/burning/hurting, and thought it was due to insect bites.  Someone took a look today, and said may need evaluation, and came in.  She is unsure if area has spread. Denies warmth, swelling. Denies fever, chills, night sweats. Denies recent URI symptoms, such as cough, congestion, sore throat. Recently went to the beach, however, no obvious new exposures, contacts. Has tried ice without relief.      Past Medical History:  Diagnosis Date  . Anxiety   . Arthritis    "qwhere" (08/09/2017)  . Chest pain 2011   negative for MI  . Chronic kidney disease    renal insufficiency. 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 Chronic lower back pain   . Depression   . Dyspnea    SOB while walking  . GERD (gastroesophageal reflux disease)   . Heart murmur   . History of hiatal hernia   . Hyperlipidemia   . Hypertension   . Pneumonia ~ 2014  . Polymyalgia ([place])   . Strep throat     Patient Active Problem List   Diagnosis Date Noted  . Chronic lower back pain 03/14/2018  . Leg pain 01/10/2018  . Aortic stenosis 12/11/2017  . Depression 11/07/2017  . Healthcare maintenance 11/07/2017  . Dyspnea on exertion 11/07/2017  . Obesity with body mass index of 30.0-39.9 11/07/2017  . 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 HTN (hypertension) 05/29/2014  . Chest pressure 05/29/2014  . Hyperlipidemia 05/29/2014    Past Surgical History:  Procedure Laterality Date  . ABDOMINAL HYSTERECTOMY     partial  . BILATERAL CARPAL TUNNEL RELEASE  2003-2005   right-left  . CATARACT EXTRACTION W/ INTRAOCULAR LENS  IMPLANT, BILATERAL Bilateral   .  JOINT REPLACEMENT    . REVERSE SHOULDER ARTHROPLASTY Left 10/12/2016   Procedure: REVERSE LEFT SHOULDER ARTHROPLASTY;  Surgeon: Justice Britain, MD;  Location: [facility];  Service: Orthopedics;  Laterality: Left;  . REVERSE SHOULDER ARTHROPLASTY Right 08/09/2017   Procedure: RIGHT REVERSE SHOULDER ARTHROPLASTY;  Surgeon: Justice Britain, MD;  Location: [facility];  Service: Orthopedics;  Laterality: Right;  . SHOULDER ARTHROSCOPY Right 2003  . TOOTH EXTRACTION     "1 tooth" (08/09/2017)  . TOTAL KNEE ARTHROPLASTY Bilateral     OB History   No obstetric history on file.      Home Medications    Prior to Admission medications   Medication Sig Start Date End Date Taking? Authorizing Provider  acetaminophen (TYLENOL) 325 MG tablet Take 650 mg by mouth 4 (four) times daily as needed (for pain.).    [provider]  amLODipine (NORVASC) 2.5 MG tablet TAKE 1 TABLET BY MOUTH  EVERY EVENING 09/12/18   Nahser, Wonda Cheng, MD  atorvastatin (LIPITOR) 40 MG tablet TAKE 1 TABLET BY MOUTH  DAILY AT 6 PM. 06/10/18   Danford, Valetta Fuller D, NP  carvedilol (COREG) 6.25 MG tablet TAKE 1 TABLET BY MOUTH TWO  TIMES DAILY 09/12/18   Nahser, Wonda Cheng, MD  cetirizine (ZYRTEC) 10 MG tablet Take 10 mg by mouth daily.  [provider]  ferrous sulfate 325 (65 FE) MG tablet Take 1 tablet (325 mg total) by mouth daily with breakfast. Take on Monday, Wednesday, Fridays 03/14/18   Danford, Orpha BurKaty D, NP  FLUoxetine (PROZAC) 20 MG capsule Take 1 capsule (20 mg total) by mouth daily. PATIENT NEEDS FOLLOW UP WITH PRESCRIBER PRIOR TO ANY FURTHER REFILLS 09/12/18   Danford, Orpha BurKaty D, NP  fluticasone (FLONASE) 50 MCG/ACT nasal spray USE 2 SPRAYS INTO BOTH  NOSTRILS DAILY AS NEEDED  FOR ALLERGIES 07/08/18   Danford, Orpha BurKaty D, NP  hydrochlorothiazide (HYDRODIURIL) 25 MG tablet TAKE 1 TABLET BY MOUTH  DAILY 09/12/18   Nahser, Deloris PingPhilip J, MD  naproxen (NAPROSYN) 500 MG tablet TAKE 1 TABLET BY MOUTH TWO  TIMES DAILY WITH A MEAL 08/27/18   Danford, Orpha BurKaty  D, NP  omeprazole (PRILOSEC) 20 MG capsule TAKE 1 CAPSULE BY MOUTH  DAILY 08/27/18   Danford, Orpha BurKaty D, NP  Polyethyl Glycol-Propyl Glycol (LUBRICANT EYE DROPS) 0.4-0.3 % SOLN Place 1 drop into both eyes 3 (three) times daily as needed (for dry/irritated eyes.).    [provider]  triamcinolone cream (KENALOG) 0.1 % Apply 1 application topically 2 (two) times daily. 09/13/18   Cathie HoopsYu, Bich Mchaney V, PA-C  valACYclovir (VALTREX) 1000 MG tablet Take 1 tablet (1,000 mg total) by mouth 3 (three) times daily for 7 days. 09/13/18 09/20/18  Belinda FisherYu, Aubry Rankin V, PA-C    Family History Family History  Problem Relation Age of Onset  . Heart attack Mother   . Heart disease Mother   . Hypertension Father   . Heart failure Father   . Kidney failure Father   . Hyperlipidemia Father   . Stroke Father   . Hypertension Sister   . Hyperlipidemia Sister     Social History Social History   Tobacco Use  . Smoking status: Never Smoker  . Smokeless tobacco: Never Used  Substance Use Topics  . Alcohol use: Never    Frequency: Never  . Drug use: Never     Allergies   Penicillins   Review of Systems Review of Systems  Reason unable to perform ROS: See HPI as above.     Physical Exam Triage Vital Signs ED Triage Vitals  Enc Vitals Group     BP 09/13/18 1014 (!) 151/88     Pulse Rate 09/13/18 1014 76     Resp 09/13/18 1014 18     Temp 09/13/18 1014 98.2 F (36.8 C)     Temp Source 09/13/18 1014 Oral     SpO2 09/13/18 1014 94 %     Weight --      Height --      Head Circumference --      Peak Flow --      Pain Score 09/13/18 1013 8     Pain Loc --      Pain Edu? --      Excl. in GC? --    No data found.  Updated Vital Signs BP (!) 151/88 (BP Location: Left Arm)   Pulse 76   Temp 98.2 F (36.8 C) (Oral)   Resp 18   SpO2 94%   Physical Exam Constitutional:      General: She is not in acute distress.    Appearance: She is well-developed. She is not ill-appearing, toxic-appearing or  diaphoretic.  HENT:     Head: Normocephalic and atraumatic.  Eyes:     Conjunctiva/sclera: Conjunctivae normal.     Pupils: Pupils are equal,  round, and reactive to light.  Skin:    General: Skin is warm and dry.     Comments: See picture below. Erythematous lesions spreading bilaterally towards the abdomen. Few vesicles seen. No tenderness to palpation. No warmth  Neurological:     Mental Status: She is alert and oriented to person, place, and time.            UC Treatments / Results  Labs (all labs ordered are listed, but only abnormal results are displayed) Labs Reviewed - No data to display  EKG None  Radiology No results found.  Procedures Procedures (including critical care time)  Medications Ordered in UC Medications - No data to display  Initial Impression / Assessment and Plan / UC Course  I have reviewed the triage vital signs and the nursing notes.  Pertinent labs & imaging results that were available during my care of the patient were reviewed by me and considered in my medical decision making (see chart for details).    Discussed questionable shingles given bilateral rash. However, both seems to be following same dermatome, will cover for shingles with valtrex. Will also provide triamcinolone for itching. Return precautions given. Patient expresses understanding and agrees to plan.  Final Clinical Impressions(s) / UC Diagnoses   Final diagnoses:  Rash    ED Prescriptions    Medication Sig Dispense Auth. Provider   valACYclovir (VALTREX) 1000 MG tablet Take 1 tablet (1,000 mg total) by mouth 3 (three) times daily for 7 days. 21 tablet Hollie Wojahn V, PA-C   triamcinolone cream (KENALOG) 0.1 % Apply 1 application topically 2 (two) times daily. 30 g Threasa AlphaYu, Heena Woodbury V, PA-C        Shephanie Romas V, [place]PA-C 09/13/18 1053

## 2018-09-13 NOTE — Discharge Instructions (Signed)
As discussed, start valtrex for possible shingles. Start triamcinolone for itching. As discussed, if rash does not resolve, follow up in 7 days for recheck. Monitor for spreading redness, increased warmth, fever, follow up for reevaluation needed.

## 2018-09-13 NOTE — ED Notes (Signed)
Patient able to ambulate independently  

## 2018-09-13 NOTE — ED Triage Notes (Signed)
Pt presents to [facility] for assessment of rash left flank, patient strongly feels its shingles, but states she got the vaccine a few years ago.

## 2018-10-15 ENCOUNTER — Telehealth: Payer: Self-pay | Admitting: Adult Health

## 2018-11-19 ENCOUNTER — Ambulatory Visit: Payer: Medicare Other | Admitting: Adult Health

## 2018-11-28 ENCOUNTER — Other Ambulatory Visit: Payer: Self-pay | Admitting: Cardiovascular Disease

## 2018-11-28 DIAGNOSIS — I10 Essential (primary) hypertension: Secondary | ICD-10-CM

## 2018-11-28 DIAGNOSIS — E785 Hyperlipidemia, unspecified: Secondary | ICD-10-CM

## 2018-12-19 ENCOUNTER — Ambulatory Visit: Payer: Medicare Other | Admitting: Adult Health

## 2018-12-25 ENCOUNTER — Ambulatory Visit: Payer: Medicare Other | Admitting: Adult Health

## 2018-12-30 ENCOUNTER — Encounter: Payer: Self-pay | Admitting: Adult Health

## 2018-12-30 ENCOUNTER — Other Ambulatory Visit: Payer: Self-pay

## 2018-12-30 ENCOUNTER — Ambulatory Visit (INDEPENDENT_AMBULATORY_CARE_PROVIDER_SITE_OTHER): Payer: Medicare Other | Admitting: Adult Health

## 2018-12-30 VITALS — BP 148/76 | HR 71 | Temp 98.3°F | Ht 64.5 in | Wt 235.5 lb

## 2018-12-30 DIAGNOSIS — G629 Polyneuropathy, unspecified: Secondary | ICD-10-CM | POA: Diagnosis not present

## 2018-12-30 DIAGNOSIS — I350 Nonrheumatic aortic (valve) stenosis: Secondary | ICD-10-CM

## 2018-12-30 DIAGNOSIS — R0789 Other chest pain: Secondary | ICD-10-CM

## 2018-12-30 DIAGNOSIS — M79605 Pain in left leg: Secondary | ICD-10-CM

## 2018-12-30 DIAGNOSIS — I10 Essential (primary) hypertension: Secondary | ICD-10-CM | POA: Diagnosis not present

## 2018-12-30 DIAGNOSIS — E785 Hyperlipidemia, unspecified: Secondary | ICD-10-CM

## 2018-12-30 DIAGNOSIS — R739 Hyperglycemia, unspecified: Secondary | ICD-10-CM | POA: Diagnosis not present

## 2018-12-30 DIAGNOSIS — E669 Obesity, unspecified: Secondary | ICD-10-CM

## 2018-12-30 DIAGNOSIS — Z0000 Encounter for general adult medical examination without abnormal findings: Secondary | ICD-10-CM

## 2018-12-30 LAB — POCT GLYCOSYLATED HEMOGLOBIN (HGB A1C): Hemoglobin A1C: 5.5 % (ref 4.0–5.6)

## 2018-12-30 NOTE — Assessment & Plan Note (Signed)
12/2017 ECHO EF 60-65% Impressions: There is mild worsening of aortic stenosis compared to 2017. Theincrease in aortic valve gradients is at least in part due to a higher cardiac output

## 2018-12-30 NOTE — Assessment & Plan Note (Signed)
RLE- on/off >12 months She denies acute injury/accident prior to onset of sx's A1c-5.5 Will check B-12 level with fasting labs Instructed to re-start CoQ10 Use Volteran Gel consistently Stretch Daily

## 2018-12-30 NOTE — Patient Instructions (Addendum)
Muscle Pain, Adult Muscle pain (myalgia) may be mild or severe. In most cases, the pain lasts only a short time and it goes away without treatment. It is normal to feel some muscle pain after starting a workout program. Muscles that have not been used often will be sore at first. Muscle pain may also be caused by many other things, including:  Overuse or muscle strain, especially if you are not in shape. This is the most common cause of muscle pain.  Injury.  Bruises.  Viruses, such as the flu.  Infectious diseases.  A chronic condition that causes muscle tenderness, fatigue, and headache (fibromyalgia).  A condition, such as lupus, in which the body's disease-fighting system attacks other organs in the body (autoimmune or rheumatologic diseases).  Certain drugs, including ACE inhibitors and statins. To diagnose the cause of your muscle pain, your health care provider will do a physical exam and ask questions about the pain and when it began. If you have not had muscle pain for very long, your health care provider may want to wait before doing much testing. If your muscle pain has lasted a long time, your health care provider may want to run tests right away. In some cases, this may include tests to rule out certain conditions or illnesses. Treatment for muscle pain depends on the cause. Home care is often enough to relieve muscle pain. Your health care provider may also prescribe anti-inflammatory medicine. Follow these instructions at home: Activity  If overuse is causing your muscle pain: ? Slow down your activities until the pain goes away. ? Do regular, gentle exercises if you are not usually active. ? Warm up before exercising. Stretch before and after exercising. This can help lower the risk of muscle pain.  Do not continue working out if the pain is very bad. Bad pain could mean that you have injured a muscle. Managing pain and discomfort   If directed, apply ice to the sore  muscle: ? Put ice in a plastic bag. ? Place a towel between your skin and the bag. ? Leave the ice on for 20 minutes, 2-3 times a day.  You may also alternate between applying ice and applying heat as told by your health care provider. To apply heat, use the heat source that your health care provider recommends, such as a moist heat pack or a heating pad. ? Place a towel between your skin and the heat source. ? Leave the heat on for 20-30 minutes. ? Remove the heat if your skin turns bright red. This is especially important if you are unable to feel pain, heat, or cold. You may have a greater risk of getting burned. Medicines  Take over-the-counter and prescription medicines only as told by your health care provider.  Do not drive or use heavy machinery while taking prescription pain medicine. Contact a health care provider if:  Your muscle pain gets worse and medicines do not help.  You have muscle pain that lasts longer than 3 days.  You have a rash or fever along with muscle pain.  You have muscle pain after a tick bite.  You have muscle pain while working out, even though you are in good physical condition.  You have redness, soreness, or swelling along with muscle pain.  You have muscle pain after starting a new medicine or changing the dose of a medicine. Get help right away if:  You have trouble breathing.  You have trouble swallowing.  You have muscle   pain along with a stiff neck, fever, and vomiting.  You have severe muscle weakness or cannot move part of your body. This information is not intended to replace advice given to you by your health care provider. Make sure you discuss any questions you have with your health care provider. Document Released: 02/09/2006 Document Revised: 03/02/2017 Document Reviewed: 08/10/2015 Elsevier Patient Education  [place].  1) Re-start OTC CoQ10. 2) Remain well hydrated with water. 3) A1c (average of your blood sugar  over the last 3 months)- is normal at 5.5. 4) Consistently use Volteran Gel. 5) Return for fasting labs this week. 6) Please follow-up with Cardiologist/Dr. Acie Fredrickson- if you are concerned about exposure- ask about TeleMedicine options. 7) Remain as active as possible, weight loss will help reduce joint pain. 8) Follow-up 3 months for chronic medical conditions. 9) If right lower leg pain does not improve in 6 weeks- please schedule follow-up. 10) Continue to social distance and wear a mask when in public. GREAT TO SEE YOU!

## 2018-12-30 NOTE — Assessment & Plan Note (Signed)
Denies any acute cardiac sx's 12/2017 ECHO EF 60-65% Impressions: There is mild worsening of aortic stenosis compared to 2017. Theincrease in aortic valve gradients is at least in part due to a higher cardiac output.

## 2018-12-30 NOTE — Assessment & Plan Note (Signed)
Current wt 235, up 7 lbs since lasy OV 06/2018 Body mass index is 39.8 kg/m.  Wt loss encouraged

## 2018-12-30 NOTE — Progress Notes (Signed)
Subjective:    Patient ID: Tracey Walton, female    DOB: Dec 19, 1940, 78 y.o.   MRN: [id number]  HPI:  Tracey Walton presents with RLE pain that has been on/off >12 months. Pain is localized to R fibularis Longus and Extensor Digitorum Longus- pain is intermittent, described as aching, rated 4/10. She denies hx of DVT or other clotting disorders. She denies acute injury/accident prior to onset of sx's. She is on Atorvastatin 40mg - was on CoQ10, reports tolerating well- unsure why she stopped-instructed to re-start. She reports inconsistent use Volteran Gel. She has extensive hx of polymyalgia's She states "there was some uncertainty if I had RA". She denies any regular exercise.   Lab Results  Component Value Date   HGBA1C 5.5 12/30/2018   HGBA1C 5.7 (H) 11/09/2017   Patient Care Team    Relationship Specialty Notifications Start End  Mina Marble D, NP PCP - General Family Medicine  11/07/17   Nahser, Wonda Cheng, MD PCP - Cardiology Cardiology Admissions 12/11/17     Patient Active Problem List   Diagnosis Date Noted  . Chronic lower back pain 03/14/2018  . Leg pain 01/10/2018  . Aortic stenosis 12/11/2017  . Depression 11/07/2017  . Healthcare maintenance 11/07/2017  . Dyspnea on exertion 11/07/2017  . Obesity with body mass index of 30.0-39.9 11/07/2017  . 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 HTN (hypertension) 05/29/2014  . Chest pressure 05/29/2014  . Hyperlipidemia 05/29/2014     Past Medical History:  Diagnosis Date  . Anxiety   . Arthritis    "qwhere" (08/09/2017)  . Chest pain 2011   negative for MI  . Chronic kidney disease    renal insufficiency. 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 Chronic lower back pain   . Depression   . Dyspnea    SOB while walking  . GERD (gastroesophageal reflux disease)   . Heart murmur   . History of hiatal hernia   . Hyperlipidemia   . Hypertension   . Pneumonia ~ 2014  .  Polymyalgia ([place])   . Strep throat      Past Surgical History:  Procedure Laterality Date  . ABDOMINAL HYSTERECTOMY     partial  . BILATERAL CARPAL TUNNEL RELEASE  2003-2005   right-left  . CATARACT EXTRACTION W/ INTRAOCULAR LENS  IMPLANT, BILATERAL Bilateral   . JOINT REPLACEMENT    . REVERSE SHOULDER ARTHROPLASTY Left 10/12/2016   Procedure: REVERSE LEFT SHOULDER ARTHROPLASTY;  Surgeon: Justice Britain, MD;  Location: [facility];  Service: Orthopedics;  Laterality: Left;  . REVERSE SHOULDER ARTHROPLASTY Right 08/09/2017   Procedure: RIGHT REVERSE SHOULDER ARTHROPLASTY;  Surgeon: Justice Britain, MD;  Location: [facility];  Service: Orthopedics;  Laterality: Right;  . SHOULDER ARTHROSCOPY Right 2003  . TOOTH EXTRACTION     "1 tooth" (08/09/2017)  . TOTAL KNEE ARTHROPLASTY Bilateral      Family History  Problem Relation Age of Onset  . Heart attack Mother   . Heart disease Mother   . Hypertension Father   . Heart failure Father   . Kidney failure Father   . Hyperlipidemia Father   . Stroke Father   . Hypertension Sister   . Hyperlipidemia Sister      Social History   Substance and Sexual Activity  Drug Use Never     Social History   Substance and Sexual Activity  Alcohol Use Never  . Frequency: Never  Social History   Tobacco Use  Smoking Status Never Smoker  Smokeless Tobacco Never Used     Outpatient Encounter Medications as of 12/30/2018  Medication Sig  . acetaminophen (TYLENOL) 325 MG tablet Take 650 mg by mouth 4 (four) times daily as needed (for pain.).  Marland Kitchen amLODipine (NORVASC) 2.5 MG tablet Take 1 tablet (2.5 mg total) by mouth every evening. Please make yearly appt with Dr. Elease Hashimoto for October for future refills. 1st attempt  . atorvastatin (LIPITOR) 40 MG tablet TAKE 1 TABLET BY MOUTH  DAILY AT 6 PM.  . carvedilol (COREG) 6.25 MG tablet Take 1 tablet (6.25 mg total) by mouth 2 (two) times daily. Please make yearly appt with Dr. Elease Hashimoto for October for future  refills. 1st attempt  . cetirizine (ZYRTEC) 10 MG tablet Take 10 mg by mouth daily.  . ferrous sulfate 325 (65 FE) MG tablet Take 1 tablet (325 mg total) by mouth daily with breakfast. Take on Monday, Wednesday, Fridays  . FLUoxetine (PROZAC) 20 MG capsule Take 1 capsule (20 mg total) by mouth daily. PATIENT NEEDS FOLLOW UP WITH PRESCRIBER PRIOR TO ANY FURTHER REFILLS  . fluticasone (FLONASE) 50 MCG/ACT nasal spray USE 2 SPRAYS INTO BOTH  NOSTRILS DAILY AS NEEDED  FOR ALLERGIES  . hydrochlorothiazide (HYDRODIURIL) 25 MG tablet Take 1 tablet (25 mg total) by mouth daily. Please make yearly appt with Dr. Elease Hashimoto for October for future refills. 1st attempt  . naproxen (NAPROSYN) 500 MG tablet TAKE 1 TABLET BY MOUTH TWO  TIMES DAILY WITH A MEAL  . omeprazole (PRILOSEC) 20 MG capsule TAKE 1 CAPSULE BY MOUTH  DAILY  . Polyethyl Glycol-Propyl Glycol (LUBRICANT EYE DROPS) 0.4-0.3 % SOLN Place 1 drop into both eyes 3 (three) times daily as needed (for dry/irritated eyes.).  Marland Kitchen triamcinolone cream (KENALOG) 0.1 % Apply 1 application topically 2 (two) times daily.   No facility-administered encounter medications on file as of 12/30/2018.     Allergies: Penicillins  Body mass index is 39.8 kg/m.  Blood pressure (!) 148/76, pulse 71, temperature 98.3 F (36.8 C), temperature source Oral, height 5' 4.5" (1.638 m), weight 235 lb 8 oz (106.8 kg), SpO2 97 %.  Review of Systems  Constitutional: Positive for fatigue. Negative for activity change, appetite change, chills, diaphoresis, fever and unexpected weight change.  Respiratory: Negative for cough, chest tightness, shortness of breath, wheezing and stridor.   Cardiovascular: Negative for chest pain, palpitations and leg swelling.  Endocrine: Negative for cold intolerance, heat intolerance, polydipsia, polyphagia and polyuria.  Musculoskeletal: Positive for arthralgias, back pain, gait problem, joint swelling, myalgias, neck pain and neck stiffness.   Neurological: Negative for dizziness and headaches.  Hematological: Negative for adenopathy. Does not bruise/bleed easily.  Psychiatric/Behavioral: Negative for agitation, behavioral problems, confusion, decreased concentration, dysphoric mood, hallucinations, self-injury, sleep disturbance and suicidal ideas. The patient is not nervous/anxious and is not hyperactive.        Objective:   Physical Exam Vitals signs and nursing note reviewed.  Constitutional:      General: She is not in acute distress.    Appearance: She is obese. She is not ill-appearing, toxic-appearing or diaphoretic.  Cardiovascular:     Rate and Rhythm: Normal rate and regular rhythm.     Pulses: Normal pulses.     Heart sounds: Normal heart sounds. No murmur. No friction rub. No gallop.   Pulmonary:     Effort: Pulmonary effort is normal. No respiratory distress.     Breath sounds: Normal  breath sounds. No stridor. No wheezing, rhonchi or rales.  Chest:     Chest wall: No tenderness.  Musculoskeletal: Normal range of motion.        General: Tenderness present. No signs of injury.     Right lower leg: She exhibits tenderness. She exhibits no bony tenderness, no swelling, no deformity and no laceration. No edema.     Left lower leg: No edema.     Comments: No warmth, mass, swelling noted. Mild tenderness noted over R  R fibularis Longus and Extensor Digitorum Longus.   Skin:    Capillary Refill: Capillary refill takes less than 2 seconds.  Neurological:     Mental Status: She is alert and oriented to person, place, and time.  Psychiatric:        Mood and Affect: Mood normal.        Behavior: Behavior normal.        Thought Content: Thought content normal.        Judgment: Judgment normal.       Assessment & Plan:   1. Elevated blood sugar   2. Neuropathy   3. Hypertension, unspecified type   4. Hyperlipidemia, unspecified hyperlipidemia type   5. Healthcare maintenance   6. Pain of left lower  extremity   7. Chest pressure   8. Obesity with body mass index of 30.0-39.9   9. Aortic valve stenosis, etiology of cardiac valve disease unspecified     Healthcare maintenance 1) Re-start OTC CoQ10. 2) Remain well hydrated with water. 3) A1c (average of your blood sugar over the last 3 months)- is normal at 5.5. 4) Consistently use Volteran Gel. 5) Return for fasting labs this week. 6) Please follow-up with Cardiologist/Dr. Melburn PopperNasher- if you are concerned about exposure- ask about TeleMedicine options. 7) Remain as active as possible, weight loss will help reduce joint pain. 8) Follow-up 3 months for chronic medical conditions. 9) If right lower leg pain does not improve in 6 weeks- please schedule follow-up. 10) Continue to social distance and wear a mask when in public.  HTN (hypertension) 148/76, HR 71 Currently on Amlodipine 2.5mg  QD, Carvedilol 6.25mg  BID, HCTZ 25mg  QD Advised to f/u with her Cardiologist/Dr. Elease HashimotoNahser  Leg pain RLE- on/off >12 months She denies acute injury/accident prior to onset of sx's A1c-5.5 Will check B-12 level with fasting labs Instructed to re-start CoQ10 Use Volteran Gel consistently Stretch Daily   Hyperlipidemia Atorvastatin 40mg  QD Re-start CoQ10  Chest pressure Denies any acute cardiac sx's 12/2017 ECHO EF 60-65% Impressions: There is mild worsening of aortic stenosis compared to 2017. Theincrease in aortic valve gradients is at least in part due to a higher cardiac output.   Obesity with body mass index of 30.0-39.9 Current wt 235, up 7 lbs since lasy OV 06/2018 Body mass index is 39.8 kg/m.  Wt loss encouraged   Aortic stenosis 12/2017 ECHO EF 60-65% Impressions: There is mild worsening of aortic stenosis compared to 2017. Theincrease in aortic valve gradients is at least in part due to a higher cardiac output  FOLLOW-UP:  Return in about 3 months (around 03/31/2019) for Regular Follow Up, HTN, Obesity, Hypercholestermia.

## 2018-12-30 NOTE — Assessment & Plan Note (Addendum)
148/76, HR 71 Currently on Amlodipine 2.5mg  QD, Carvedilol 6.25mg  BID, HCTZ 25mg  QD Advised to f/u with her Cardiologist/Dr. Acie Fredrickson

## 2018-12-30 NOTE — Assessment & Plan Note (Signed)
>>  ASSESSMENT AND PLAN FOR HYPERTENSION WRITTEN ON 12/30/2018  2:43 PM BY DANFORD, KATY D, NP  148/76, HR 71 Currently on Amlodipine 2.5mg  QD, Carvedilol 6.25mg  BID, HCTZ 25mg  QD Advised to f/u with her Cardiologist/Dr. Elease Hashimoto

## 2018-12-30 NOTE — Assessment & Plan Note (Signed)
Atorvastatin 40mg  QD Re-start CoQ10

## 2018-12-30 NOTE — Assessment & Plan Note (Signed)
1) Re-start OTC CoQ10. 2) Remain well hydrated with water. 3) A1c (average of your blood sugar over the last 3 months)- is normal at 5.5. 4) Consistently use Volteran Gel. 5) Return for fasting labs this week. 6) Please follow-up with Cardiologist/Dr. Cathie Olden- if you are concerned about exposure- ask about TeleMedicine options. 7) Remain as active as possible, weight loss will help reduce joint pain. 8) Follow-up 3 months for chronic medical conditions. 9) If right lower leg pain does not improve in 6 weeks- please schedule follow-up. 10) Continue to social distance and wear a mask when in public.

## 2019-01-02 ENCOUNTER — Other Ambulatory Visit: Payer: Self-pay

## 2019-01-02 ENCOUNTER — Other Ambulatory Visit: Payer: Medicare Other

## 2019-01-02 DIAGNOSIS — Z0000 Encounter for general adult medical examination without abnormal findings: Secondary | ICD-10-CM

## 2019-01-02 DIAGNOSIS — I10 Essential (primary) hypertension: Secondary | ICD-10-CM

## 2019-01-02 DIAGNOSIS — E785 Hyperlipidemia, unspecified: Secondary | ICD-10-CM

## 2019-01-02 DIAGNOSIS — R739 Hyperglycemia, unspecified: Secondary | ICD-10-CM

## 2019-01-02 DIAGNOSIS — G629 Polyneuropathy, unspecified: Secondary | ICD-10-CM

## 2019-01-03 LAB — LIPID PANEL
Chol/HDL Ratio: 4.4 ratio (ref 0.0–4.4)
Cholesterol, Total: 181 mg/dL (ref 100–199)
HDL: 41 mg/dL (ref >39)
LDL Chol Calc (NIH): 123 mg/dL — ABNORMAL HIGH (ref 0–99)
Triglycerides: 95 mg/dL (ref 0–149)
VLDL Cholesterol Cal: 17 mg/dL (ref 5–40)

## 2019-01-03 LAB — CBC WITH DIFFERENTIAL/PLATELET
Basophils Absolute: 0 10*3/uL (ref 0.0–0.2)
Basos: 1 %
EOS (ABSOLUTE): 0.2 10*3/uL (ref 0.0–0.4)
Eos: 4 %
Hematocrit: 39.1 % (ref 34.0–46.6)
Hemoglobin: 13.4 g/dL (ref 11.1–15.9)
Immature Grans (Abs): 0 10*3/uL (ref 0.0–0.1)
Immature Granulocytes: 1 %
Lymphocytes Absolute: 1.3 10*3/uL (ref 0.7–3.1)
Lymphs: 29 %
MCH: 31.8 pg (ref 26.6–33.0)
MCHC: 34.3 g/dL (ref 31.5–35.7)
MCV: 93 fL (ref 79–97)
Monocytes Absolute: 0.5 10*3/uL (ref 0.1–0.9)
Monocytes: 11 %
Neutrophils Absolute: 2.4 10*3/uL (ref 1.4–7.0)
Neutrophils: 54 %
Platelets: 356 10*3/uL (ref 150–450)
RBC: 4.22 x10E6/uL (ref 3.77–5.28)
RDW: 12.6 % (ref 11.7–15.4)
WBC: 4.4 10*3/uL (ref 3.4–10.8)

## 2019-01-03 LAB — COMPREHENSIVE METABOLIC PANEL
ALT: 13 IU/L (ref 0–32)
AST: 14 IU/L (ref 0–40)
Albumin/Globulin Ratio: 1.4 (ref 1.2–2.2)
Albumin: 4.1 g/dL (ref 3.7–4.7)
Alkaline Phosphatase: 100 IU/L (ref 39–117)
BUN/Creatinine Ratio: 22 (ref 12–28)
BUN: 22 mg/dL (ref 8–27)
Bilirubin Total: 0.6 mg/dL (ref 0.0–1.2)
CO2: 25 mmol/L (ref 20–29)
Calcium: 9.1 mg/dL (ref 8.7–10.3)
Chloride: 99 mmol/L (ref 96–106)
Creatinine, Ser: 1.02 mg/dL — ABNORMAL HIGH (ref 0.57–1.00)
GFR calc Af Amer: 61 mL/min/{1.73_m2} (ref >59)
GFR calc non Af Amer: 53 mL/min/{1.73_m2} — ABNORMAL LOW (ref >59)
Globulin, Total: 2.9 g/dL (ref 1.5–4.5)
Glucose: 99 mg/dL (ref 65–99)
Potassium: 5 mmol/L (ref 3.5–5.2)
Sodium: 136 mmol/L (ref 134–144)
Total Protein: 7 g/dL (ref 6.0–8.5)

## 2019-01-03 LAB — VITAMIN B12: Vitamin B-12: 393 pg/mL (ref 232–1245)

## 2019-01-03 LAB — HEMOGLOBIN A1C
Est. average glucose Bld gHb Est-mCnc: 111 mg/dL
Hgb A1c MFr Bld: 5.5 % (ref 4.8–5.6)

## 2019-01-03 LAB — RHEUMATOID FACTOR: Rheumatoid fact SerPl-aCnc: <10 IU/mL (ref 0.0–13.9)

## 2019-01-03 LAB — TSH: TSH: 0.855 u[IU]/mL (ref 0.450–4.500)

## 2019-01-15 ENCOUNTER — Other Ambulatory Visit: Payer: Self-pay | Admitting: Adult Health

## 2019-01-17 ENCOUNTER — Other Ambulatory Visit: Payer: Self-pay | Admitting: Adult Health

## 2019-01-17 DIAGNOSIS — E785 Hyperlipidemia, unspecified: Secondary | ICD-10-CM

## 2019-01-17 DIAGNOSIS — I10 Essential (primary) hypertension: Secondary | ICD-10-CM

## 2019-02-12 ENCOUNTER — Telehealth: Payer: Self-pay

## 2019-02-12 ENCOUNTER — Telehealth (INDEPENDENT_AMBULATORY_CARE_PROVIDER_SITE_OTHER): Payer: Medicare Other | Admitting: Cardiovascular Disease

## 2019-02-12 ENCOUNTER — Other Ambulatory Visit: Payer: Self-pay

## 2019-02-12 DIAGNOSIS — I10 Essential (primary) hypertension: Secondary | ICD-10-CM | POA: Diagnosis not present

## 2019-02-12 DIAGNOSIS — E782 Mixed hyperlipidemia: Secondary | ICD-10-CM

## 2019-02-12 MED ORDER — HYDROCHLOROTHIAZIDE 25 MG PO TABS: 25.0000 mg | ORAL_TABLET | Freq: Every day | ORAL | 3 refills | Status: DC

## 2019-02-12 MED ORDER — CARVEDILOL 6.25 MG PO TABS: 6.2500 mg | ORAL_TABLET | Freq: Two times a day (BID) | ORAL | 3 refills | Status: DC

## 2019-02-12 MED ORDER — AMLODIPINE BESYLATE 2.5 MG PO TABS: 2.5000 mg | ORAL_TABLET | Freq: Every evening | ORAL | 3 refills | Status: DC

## 2019-02-12 MED ORDER — ATORVASTATIN CALCIUM 40 MG PO TABS: 40.0000 mg | ORAL_TABLET | Freq: Every day | ORAL | 3 refills | Status: DC

## 2019-02-12 NOTE — Progress Notes (Signed)
Virtual Visit via Telephone Note   This visit type was conducted due to national recommendations for restrictions regarding the COVID-19 Pandemic (e.g. social distancing) in an effort to limit this patient's exposure and mitigate transmission in our community.  Due to her co-morbid illnesses, this patient is at least at moderate risk for complications without adequate follow up.  This format is felt to be most appropriate for this patient at this time.  The patient did not have access to video technology/had technical difficulties with video requiring transitioning to audio format only (telephone).  All issues noted in this document were discussed and addressed.  No physical exam could be performed with this format.  Please refer to the patient's chart for her  consent to telehealth for [facility].   Date:  02/12/2019   ID:  Tracey Setaebecca V Raffo, DOB October 10, 1940, MRN [id number]  Patient Location: Home Provider Location: Office  PCP:  Julaine Fusianford, Katy D, NP  Cardiologist:  Tracey MissPhilip Kyliegh Jester, MD  Electrophysiologist:  None   Evaluation Performed:  Follow-Up Visit  Chief Complaint:  HTN,   History of Present Illness:    Tracey Walton is a 78 y.o. female with history of hypertension, polymyalgia, hyperlipidemia, hiatal hernia.  She was last seen in our office on January 10, 2018 by Lizabeth LeydenNina Hammond, NP.  She had some problems with anemia.  She has been under some stress with her brother in law .   BP has been a little elevated.  Has been eating more salt . Marland Kitchen. Also lots of pain with arthritis  No CP . Has some DOE. Eats [place]key hotdogs   meds reviewed  Labs reviewed,  Hb is now normal .    The patient does not have symptoms concerning for COVID-19 infection (fever, chills, cough, or new shortness of breath).    Past Medical History:  Diagnosis Date  . Anxiety   . Arthritis    "qwhere" (08/09/2017)  . Chest pain 2011   negative for MI  . Chronic kidney disease    renal insufficiency.  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 Chronic lower back pain   . Depression   . Dyspnea    SOB while walking  . GERD (gastroesophageal reflux disease)   . Heart murmur   . History of hiatal hernia   . Hyperlipidemia   . Hypertension   . Pneumonia ~ 2014  . Polymyalgia ([facility])   . Strep throat    Past Surgical History:  Procedure Laterality Date  . ABDOMINAL HYSTERECTOMY     partial  . BILATERAL CARPAL TUNNEL RELEASE  2003-2005   right-left  . CATARACT EXTRACTION W/ INTRAOCULAR LENS  IMPLANT, BILATERAL Bilateral   . JOINT REPLACEMENT    . REVERSE SHOULDER ARTHROPLASTY Left 10/12/2016   Procedure: REVERSE LEFT SHOULDER ARTHROPLASTY;  Surgeon: Francena HanlySupple, Kevin, MD;  Location: [facility];  Service: Orthopedics;  Laterality: Left;  . REVERSE SHOULDER ARTHROPLASTY Right 08/09/2017   Procedure: RIGHT REVERSE SHOULDER ARTHROPLASTY;  Surgeon: Francena HanlySupple, Kevin, MD;  Location: [facility];  Service: Orthopedics;  Laterality: Right;  . SHOULDER ARTHROSCOPY Right 2003  . TOOTH EXTRACTION     "1 tooth" (08/09/2017)  . TOTAL KNEE ARTHROPLASTY Bilateral      Current Meds  Medication Sig  . acetaminophen (TYLENOL) 325 MG tablet Take 650 mg by mouth 4 (four) times daily as needed (for pain.).  Marland Kitchen. amLODipine (NORVASC) 2.5 MG tablet Take 1 tablet (2.5 mg total)  by mouth every evening.  Marland Kitchen atorvastatin (LIPITOR) 40 MG tablet Take 1 tablet (40 mg total) by mouth daily at 6 PM.  . carvedilol (COREG) 6.25 MG tablet Take 1 tablet (6.25 mg total) by mouth 2 (two) times daily.  . cetirizine (ZYRTEC) 10 MG tablet Take 10 mg by mouth daily.  . Coenzyme Q10 ([employer] COENZYME Q-10) 100 MG capsule Take 200 mg by mouth daily.  . ferrous sulfate 325 (65 FE) MG tablet Take 1 tablet (325 mg total) by mouth daily with breakfast. Take on Monday, Wednesday, Fridays  . FLUoxetine (PROZAC) 20 MG capsule Take 1 capsule (20 mg total) by mouth daily.  . fluticasone (FLONASE) 50 MCG/ACT nasal spray USE 2 SPRAYS INTO  BOTH  NOSTRILS DAILY AS NEEDED  FOR ALLERGIES  . hydrochlorothiazide (HYDRODIURIL) 25 MG tablet Take 1 tablet (25 mg total) by mouth daily.  Marland Kitchen omeprazole (PRILOSEC) 20 MG capsule TAKE 1 CAPSULE BY MOUTH  DAILY  . Polyethyl Glycol-Propyl Glycol (LUBRICANT EYE DROPS) 0.4-0.3 % SOLN Place 1 drop into both eyes 3 (three) times daily as needed (for dry/irritated eyes.).  Marland Kitchen triamcinolone cream (KENALOG) 0.1 % Apply 1 application topically 2 (two) times daily.  . [DISCONTINUED] amLODipine (NORVASC) 2.5 MG tablet Take 1 tablet (2.5 mg total) by mouth every evening. Please make yearly appt with Dr. Acie Fredrickson for October for future refills. 1st attempt  . [DISCONTINUED] atorvastatin (LIPITOR) 40 MG tablet TAKE 1 TABLET BY MOUTH  DAILY AT 6 PM.  . [DISCONTINUED] carvedilol (COREG) 6.25 MG tablet Take 1 tablet (6.25 mg total) by mouth 2 (two) times daily. Please make yearly appt with Dr. Acie Fredrickson for October for future refills. 1st attempt  . [DISCONTINUED] hydrochlorothiazide (HYDRODIURIL) 25 MG tablet Take 1 tablet (25 mg total) by mouth daily. Please make yearly appt with Dr. Acie Fredrickson for October for future refills. 1st attempt     Allergies:   Penicillins   Social History   Tobacco Use  . Smoking status: Never Smoker  . Smokeless tobacco: Never Used  Substance Use Topics  . Alcohol use: Never    Frequency: Never  . Drug use: Never     Family Hx: The patient's family history includes Heart attack in her mother; Heart disease in her mother; Heart failure in her father; Hyperlipidemia in her father and sister; Hypertension in her father and sister; Kidney failure in her father; Stroke in her father.  ROS:   Please see the history of present illness.     All other systems reviewed and are negative.   Prior CV studies:   The following studies were reviewed today:    Labs/Other Tests and Data Reviewed:    EKG:  No ECG reviewed.  Recent Labs: 01/02/2019: ALT 13; BUN 22; Creatinine, Ser 1.02;  Hemoglobin 13.4; Platelets 356; Potassium 5.0; Sodium 136; TSH 0.855   Recent Lipid Panel Lab Results  Component Value Date/Time   CHOL 181 01/02/2019 09:33 AM   TRIG 95 01/02/2019 09:33 AM   HDL 41 01/02/2019 09:33 AM   CHOLHDL 4.4 01/02/2019 09:33 AM   CHOLHDL 3.6 10/01/2015 07:46 AM   LDLCALC 123 (H) 01/02/2019 09:33 AM    Wt Readings from Last 3 Encounters:  02/12/19 234 lb (106.1 kg)  12/30/18 235 lb 8 oz (106.8 kg)  06/10/18 228 lb 3.2 oz (103.5 kg)     Objective:    Vital Signs:  BP (!) 162/87   Pulse 70   Wt 234 lb (106.1 kg)   BMI  39.55 kg/m    Telephone visit.  No exam available   ASSESSMENT & PLAN:    1. Essential hypertension: [name] seems to be doing well.  She has been under lots of stress with the death of her brother-in-law.  Her blood pressure is on the low bit.  She admits to eating a lot more salty foods than she normally does.  She also admits to not exercising.  I encouraged her to continue with her current medications.  Of encouraged her to work on decreasing the salt in her diet.  We will have her follow-up with an APP in 6 months.  COVID-19 Education: The signs and symptoms of COVID-19 were discussed with the patient and how to seek care for testing (follow up with PCP or arrange E-visit).  The importance of social distancing was discussed today.  Time:   Today, I have spent  18  minutes with the patient with telehealth technology discussing the above problems.     Medication Adjustments/Labs and Tests Ordered: Current medicines are reviewed at length with the patient today.  Concerns regarding medicines are outlined above.   Tests Ordered: No orders of the defined types were placed in this encounter.   Medication Changes: Meds ordered this encounter  Medications  . amLODipine (NORVASC) 2.5 MG tablet    Sig: Take 1 tablet (2.5 mg total) by mouth every evening.    Dispense:  90 tablet    Refill:  3  . atorvastatin (LIPITOR) 40 MG tablet     Sig: Take 1 tablet (40 mg total) by mouth daily at 6 PM.    Dispense:  90 tablet    Refill:  3  . carvedilol (COREG) 6.25 MG tablet    Sig: Take 1 tablet (6.25 mg total) by mouth 2 (two) times daily.    Dispense:  180 tablet    Refill:  3  . hydrochlorothiazide (HYDRODIURIL) 25 MG tablet    Sig: Take 1 tablet (25 mg total) by mouth daily.    Dispense:  90 tablet    Refill:  3    Follow Up:  In Person in 6 month(s)  Signed, Tracey Miss, MD  02/12/2019 10:36 AM    [facility]

## 2019-02-12 NOTE — Telephone Encounter (Signed)
Called to "room" patient for her virtual visit with Dr. Acie Fredrickson today. VS and consent obtained.   Virtual Visit Pre-Appointment Phone Call Confirm consent - "In the setting of the current Covid19 crisis, you are scheduled for a (phone or video) visit with your provider on (date) at (time).  Just as we do with many in-office visits, in order for you to participate in this visit, we must obtain consent.  If you'd like, I can send this to your mychart (if signed up) or email for you to review.  Otherwise, I can obtain your verbal consent now.  All virtual visits are billed to your insurance company just like a normal visit would be.  By agreeing to a virtual visit, we'd like you to understand that the technology does not allow for your provider to perform an examination, and thus may limit your provider's ability to fully assess your condition. If your provider identifies any concerns that need to be evaluated in person, we will make arrangements to do so.  Finally, though the technology is pretty good, we cannot assure that it will always work on either your or our end, and in the setting of a video visit, we may have to convert it to a phone-only visit.  In either situation, we cannot ensure that we have a secure connection.  Are you willing to proceed?" STAFF: Did the patient verbally acknowledge consent to telehealth visit? Document YES/NO here: YES    TELEPHONE CALL NOTE  Tracey Walton has been deemed a candidate for a follow-up tele-health visit to limit community exposure during the Covid-19 pandemic. I spoke with the patient via phone to ensure availability of phone/video source, confirm preferred email & phone number, and discuss instructions and expectations.  I reminded Tracey Walton to be prepared with any vital sign and/or heart rhythm information that could potentially be obtained via home monitoring, at the time of her visit. I reminded Tracey Walton to expect a phone call prior to her  visit.  Theodoro Parma, RN 02/12/2019 8:06 AM      FULL LENGTH CONSENT FOR TELE-HEALTH VISIT   I hereby voluntarily request, consent and authorize [facility] and its employed or contracted physicians, physician assistants, nurse practitioners or other licensed health care professionals (the Practitioner), to provide me with telemedicine health care services (the "Services") as deemed necessary by the treating Practitioner. I acknowledge and consent to receive the Services by the Practitioner via telemedicine. I understand that the telemedicine visit will involve communicating with the Practitioner through live audiovisual communication technology and the disclosure of certain medical information by electronic transmission. I acknowledge that I have been given the opportunity to request an in-person assessment or other available alternative prior to the telemedicine visit and am voluntarily participating in the telemedicine visit.  I understand that I have the right to withhold or withdraw my consent to the use of telemedicine in the course of my care at any time, without affecting my right to future care or treatment, and that the Practitioner or I may terminate the telemedicine visit at any time. I understand that I have the right to inspect all information obtained and/or recorded in the course of the telemedicine visit and may receive copies of available information for a reasonable fee.  I understand that some of the potential risks of receiving the Services via telemedicine include:  Marland Kitchen Delay or interruption in medical evaluation due to technological equipment failure or disruption; . Information transmitted may  not be sufficient (e.g. poor resolution of images) to allow for appropriate medical decision making by the Practitioner; and/or  . In rare instances, security protocols could fail, causing a breach of personal health information.  Furthermore, I acknowledge that it is my  responsibility to provide information about my medical history, conditions and care that is complete and accurate to the best of my ability. I acknowledge that Practitioner's advice, recommendations, and/or decision may be based on factors not within their control, such as incomplete or inaccurate data provided by me or distortions of diagnostic images or specimens that may result from electronic transmissions. I understand that the practice of medicine is not an exact science and that Practitioner makes no warranties or guarantees regarding treatment outcomes. I acknowledge that I will receive a copy of this consent concurrently upon execution via email to the email address I last provided but may also request a printed copy by calling the office of [facility].    I understand that my insurance will be billed for this visit.   I have read or had this consent read to me. . I understand the contents of this consent, which adequately explains the benefits and risks of the Services being provided via telemedicine.  . I have been provided ample opportunity to ask questions regarding this consent and the Services and have had my questions answered to my satisfaction. . I give my informed consent for the services to be provided through the use of telemedicine in my medical care  By participating in this telemedicine visit I agree to the above.

## 2019-02-12 NOTE — Patient Instructions (Signed)
Medication Instructions:  Your provider recommends that you continue on your current medications as directed. Please refer to the Current Medication list given to you today.   *If you need a refill on your cardiac medications before your next appointment, please call your pharmacy*  Lab Work: Please come fasting to your next appointment in 6 months. We will check your cholesterol.  Follow-Up: At [facility], you and your health needs are our priority.  As part of our continuing mission to provide you with exceptional heart care, we have created designated Provider Care Teams.  These Care Teams include your primary Cardiologist (physician) and Advanced Practice Providers (APPs -  Physician Assistants and Nurse Practitioners) who all work together to provide you with the care you need, when you need it. Your next appointment:   6 months The format for your next appointment:   In Person Provider:   Daune Perch, NP

## 2019-02-13 ENCOUNTER — Other Ambulatory Visit: Payer: Self-pay | Admitting: Cardiovascular Disease

## 2019-02-13 DIAGNOSIS — E782 Mixed hyperlipidemia: Secondary | ICD-10-CM

## 2019-02-13 DIAGNOSIS — I10 Essential (primary) hypertension: Secondary | ICD-10-CM

## 2019-02-13 MED ORDER — ATORVASTATIN CALCIUM 40 MG PO TABS: 40.0000 mg | ORAL_TABLET | Freq: Every day | ORAL | 3 refills | Status: DC

## 2019-02-13 MED ORDER — AMLODIPINE BESYLATE 2.5 MG PO TABS: 2.5000 mg | ORAL_TABLET | Freq: Every evening | ORAL | 3 refills | Status: DC

## 2019-02-13 MED ORDER — HYDROCHLOROTHIAZIDE 25 MG PO TABS: 25.0000 mg | ORAL_TABLET | Freq: Every day | ORAL | 3 refills | Status: DC

## 2019-02-13 MED ORDER — CARVEDILOL 6.25 MG PO TABS: 6.2500 mg | ORAL_TABLET | Freq: Two times a day (BID) | ORAL | 3 refills | Status: DC

## 2019-02-13 NOTE — Telephone Encounter (Signed)
Pt calling stating that her medications were sent to the wrong pharmacy. I resent pt's medications to the correct pharmacy as requested. Confirmation received.  °

## 2019-03-17 ENCOUNTER — Other Ambulatory Visit: Payer: Self-pay | Admitting: Adult Health

## 2019-03-17 DIAGNOSIS — E785 Hyperlipidemia, unspecified: Secondary | ICD-10-CM

## 2019-03-17 DIAGNOSIS — I10 Essential (primary) hypertension: Secondary | ICD-10-CM

## 2019-03-26 ENCOUNTER — Ambulatory Visit: Payer: Medicare Other | Admitting: Podiatry

## 2019-03-26 ENCOUNTER — Encounter: Payer: Self-pay | Admitting: Podiatry

## 2019-03-26 ENCOUNTER — Other Ambulatory Visit: Payer: Self-pay

## 2019-03-26 DIAGNOSIS — Q828 Other specified congenital malformations of skin: Secondary | ICD-10-CM

## 2019-03-26 NOTE — Progress Notes (Signed)
This patient present to the office  with chief complaint of callus developing under the outside of ball of both feet.  She says this callus has become painful walking and wearing her shoes. Patient has provided no  treatment or sought professional help.  She presents to the office for treatment of her painful callus.  Vascular  Dorsalis pedis and posterior tibial pulses are palpable  B/L.  Capillary return  WNL.  Temperature gradient is  WNL.  Skin turgor  WNL  Sensorium  Senn Weinstein monofilament wire  WNL. Normal tactile sensation.  Nail Exam  Patient has normal nails with no evidence of bacterial or fungal infection.  Orthopedic  Exam  Muscle tone and muscle strength  WNL.  No limitations of motion feet  B/L.  No crepitus or joint effusion noted.  Foot type is unremarkable and digits show no abnormalities.  Bony prominences are unremarkable.  Plantar flexed fifth metatarsal  B/L.  HAV  B/L.  Skin  No open lesions.  Normal skin texture and turgor.  Callus/porokeratosis  sub 5th  B/L  Porokeratosis secondary plantar flexed fifth metatarsal  B/L  IE  Debride callus/porokeratosis.  Discussed condition with patient.  RTC prn.  Gardiner Barefoot DPM

## 2019-04-30 ENCOUNTER — Telehealth: Payer: Self-pay

## 2019-04-30 ENCOUNTER — Other Ambulatory Visit: Payer: Self-pay | Admitting: Adult Health

## 2019-04-30 NOTE — Telephone Encounter (Signed)
Please call pt to schedule appt.  No further refills until pt is seen.  T. Kinslei Labine, CMA  

## 2019-05-07 ENCOUNTER — Other Ambulatory Visit: Payer: Self-pay | Admitting: Adult Health

## 2019-05-12 ENCOUNTER — Telehealth: Payer: Self-pay | Admitting: Adult Health

## 2019-05-12 ENCOUNTER — Telehealth: Payer: Self-pay | Admitting: Cardiovascular Disease

## 2019-05-12 ENCOUNTER — Ambulatory Visit (INDEPENDENT_AMBULATORY_CARE_PROVIDER_SITE_OTHER): Payer: Medicare Other | Admitting: Adult Health

## 2019-05-12 ENCOUNTER — Encounter: Payer: Self-pay | Admitting: Adult Health

## 2019-05-12 ENCOUNTER — Other Ambulatory Visit: Payer: Self-pay

## 2019-05-12 VITALS — Temp 98.0°F | Ht 64.5 in | Wt 241.4 lb

## 2019-05-12 DIAGNOSIS — I10 Essential (primary) hypertension: Secondary | ICD-10-CM

## 2019-05-12 DIAGNOSIS — Z0000 Encounter for general adult medical examination without abnormal findings: Secondary | ICD-10-CM | POA: Diagnosis not present

## 2019-05-12 DIAGNOSIS — H6593 Unspecified nonsuppurative otitis media, bilateral: Secondary | ICD-10-CM | POA: Diagnosis not present

## 2019-05-12 DIAGNOSIS — R42 Dizziness and giddiness: Secondary | ICD-10-CM

## 2019-05-12 LAB — POCT GLYCOSYLATED HEMOGLOBIN (HGB A1C): Hemoglobin A1C: 5.2 % (ref 4.0–5.6)

## 2019-05-12 MED ORDER — FLUOXETINE HCL 20 MG PO CAPS: 20.0000 mg | ORAL_CAPSULE | Freq: Every day | ORAL | 0 refills | Status: DC

## 2019-05-12 NOTE — Assessment & Plan Note (Addendum)
Remain well hydrated, drink enough water until your urine is clear. Your blood pressure is elevated and with your headache- recommend that you follow-up with your cardiologist. If you develop chest pain or tightness with exertion or increase in shortness of breath- seek immediate medical assistance. A1c (average of your blood sugar over the last 3 months) stable at 5.2 We will call you with lab results. If you develop cough, nasal drainage, sore throat, increase in body aches, loss of sense or smell-please be tested for Corona Virus. To reduce fluid behind your ears- OTC antihistamine. Continue to social distance and wear a mask when in public. Follow-up with primary care in 3 months. 

## 2019-05-12 NOTE — Telephone Encounter (Signed)
Patient called states has been experiencing DIZZINESS for 2 dys----tranfered her to med asst for triaging

## 2019-05-12 NOTE — Telephone Encounter (Signed)
Please see other note in chart from today. AS, CMA

## 2019-05-12 NOTE — Assessment & Plan Note (Signed)
-  OTC antihistamine

## 2019-05-12 NOTE — Telephone Encounter (Signed)
Spoke with pt and the below readings are 1 from this am prior to meds and the other two are  from yesterday  Pt c/o head fullness "sinus pressure " Pt will call PCP. Pt has f/u appt 4/8 with Dr Elease Hashimoto Will forward to Dr Elease Hashimoto for review and recommendations ./cy

## 2019-05-12 NOTE — Assessment & Plan Note (Addendum)
BP elevated with HA- f/u with Cards She denies CP/tightness with exertion Currently on amlodipine 2.5mg  QD, Carvedilol 6.25mg  BID, HCTZ 25mg  QD

## 2019-05-12 NOTE — Assessment & Plan Note (Signed)
>>  ASSESSMENT AND PLAN FOR HYPERTENSION WRITTEN ON 05/12/2019  4:39 PM BY DANFORD, KATY D, NP  BP elevated with HA- f/u with Cards She denies CP/tightness with exertion Currently on amlodipine 2.5mg  QD, Carvedilol 6.25mg  BID, HCTZ 25mg  QD

## 2019-05-12 NOTE — Patient Instructions (Addendum)
Dizziness Dizziness is a common problem. It makes you feel unsteady or light-headed. You may feel like you are about to pass out (faint). Dizziness can lead to getting hurt if you stumble or fall. Dizziness can be caused by many things, including:  Medicines.  Not having enough water in your body (dehydration).  Illness. Follow these instructions at home: Eating and drinking   Drink enough fluid to keep your pee (urine) clear or pale yellow. This helps to keep you from getting dehydrated. Try to drink more clear fluids, such as water.  Do not drink alcohol.  Limit how much caffeine you drink or eat, if your doctor tells you to do that.  Limit how much salt (sodium) you drink or eat, if your doctor tells you to do that. Activity   Avoid making quick movements. ? When you stand up from sitting in a chair, steady yourself until you feel okay. ? In the morning, first sit up on the side of the bed. When you feel okay, stand slowly while you hold onto something. Do this until you know that your balance is fine.  If you need to stand in one place for a long time, move your legs often. Tighten and relax the muscles in your legs while you are standing.  Do not drive or use heavy machinery if you feel dizzy.  Avoid bending down if you feel dizzy. Place items in your home so you can reach them easily without leaning over. Lifestyle  Do not use any products that contain nicotine or tobacco, such as cigarettes and e-cigarettes. If you need help quitting, ask your doctor.  Try to lower your stress level. You can do this by using methods such as yoga or meditation. Talk with your doctor if you need help. General instructions  Watch your dizziness for any changes.  Take over-the-counter and prescription medicines only as told by your doctor. Talk with your doctor if you think that you are dizzy because of a medicine that you are taking.  Tell a friend or a family member that you are feeling  dizzy. If he or she notices any changes in your behavior, have this person call your doctor.  Keep all follow-up visits as told by your doctor. This is important. Contact a doctor if:  Your dizziness does not go away.  Your dizziness or light-headedness gets worse.  You feel sick to your stomach (nauseous).  You have trouble hearing.  You have new symptoms.  You are unsteady on your feet.  You feel like the room is spinning. Get help right away if:  You throw up (vomit) or have watery poop (diarrhea), and you cannot eat or drink anything.  You have trouble: ? Talking. ? Walking. ? Swallowing. ? Using your arms, hands, or legs.  You feel generally weak.  You are not thinking clearly, or you have trouble forming sentences. A friend or family member may notice this.  You have: ? Chest pain. ? Pain in your belly (abdomen). ? Shortness of breath. ? Sweating.  Your vision changes.  You are bleeding.  You have a very bad headache.  You have neck pain or a stiff neck.  You have a fever. These symptoms may be an emergency. Do not wait to see if the symptoms will go away. Get medical help right away. Call your local emergency services (911 in the [place] Do not drive yourself to the hospital. Summary  Dizziness makes you feel unsteady or light-headed. You   may feel like you are about to pass out (faint).  Drink enough fluid to keep your pee (urine) clear or pale yellow. Do not drink alcohol.  Avoid making quick movements if you feel dizzy.  Watch your dizziness for any changes. This information is not intended to replace advice given to you by your health care provider. Make sure you discuss any questions you have with your health care provider. Document Revised: 03/23/2017 Document Reviewed: 04/06/2016 Elsevier Patient Education  2020 [employer]  Remain well hydrated, drink enough water until your urine is clear. Your blood pressure is elevated and with your  headache- recommend that you follow-up with your cardiologist. If you develop chest pain or tightness with exertion or increase in shortness of breath- seek immediate medical assistance. A1c (average of your blood sugar over the last 3 months) stable at 5.2 We will call you with lab results. If you develop cough, nasal drainage, sore throat, increase in body aches, loss of sense or smell-please be tested for Corona Virus. To reduce fluid behind your ears- OTC antihistamine. Continue to social distance and wear a mask when in public. Follow-up with primary care in 3 months.

## 2019-05-12 NOTE — Progress Notes (Signed)
Subjective:    Patient ID: Tracey Walton, female    DOB: [birth year], 79 y.o.   MRN: [id number]  HPI:   Tracey Walton is presents with dizziness, HA (constant frontal pressure, rated 6/10), and elevated BP the last 24 hrs. She reports limited water intake over the weekend. She denies chest pain or tightness with exertion. She denies increase in dyspnea. She reports nasal drainage last week-none the last 72 hrs. She denies any known exposure to [facility] Virus. She denies sore throat, cough, fever, increase in body aches (hx of polymyalgia's), She denies loss of sense of smell or taste. She denies change in vision. She has remote hx of vertigo- last occurrence >10 yrs ago. She states "my head feels like it 10lbs". She reports her usual ambulatory BP SBP 130s DBP 70-80s Last two days-  SBP 150s DBP 80-90s Today her BP is well above goal- she is on amlodipine 2.5mg  QD, Carvedilol 6.25mg  BID, HCTZ 25mg  QD  Lab Results  Component Value Date   HGBA1C 5.2 05/12/2019   HGBA1C 5.5 01/02/2019   HGBA1C 5.5 12/30/2018    Patient Care Team    Relationship Specialty Notifications Start End  01/01/2019 D, NP PCP - General Family Medicine  11/07/17   Nahser, 01/07/18, MD PCP - Cardiology Cardiology Admissions 12/11/17     Patient Active Problem List   Diagnosis Date Noted  . Dizziness 05/12/2019  . Fluid level behind tympanic membrane of both ears 05/12/2019  . Porokeratosis 03/26/2019  . Chronic lower back pain 03/14/2018  . Leg pain 01/10/2018  . Aortic stenosis 12/11/2017  . Depression 11/07/2017  . Healthcare maintenance 11/07/2017  . Dyspnea on exertion 11/07/2017  . Obesity with body mass index of 30.0-39.9 11/07/2017  . 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 HTN (hypertension) 05/29/2014  . Chest pressure 05/29/2014  . Hyperlipidemia 05/29/2014     Past Medical History:  Diagnosis Date  . Anxiety   . Arthritis    "qwhere" (08/09/2017)  . Chest pain 2011    negative for MI  . Chronic kidney disease    renal insufficiency. 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 Chronic lower back pain   . Depression   . Dyspnea    SOB while walking  . GERD (gastroesophageal reflux disease)   . Heart murmur   . History of hiatal hernia   . Hyperlipidemia   . Hypertension   . Pneumonia ~ 2014  . Polymyalgia (HCC)   . Strep throat      Past Surgical History:  Procedure Laterality Date  . ABDOMINAL HYSTERECTOMY     partial  . BILATERAL CARPAL TUNNEL RELEASE  2003-2005   right-left  . CATARACT EXTRACTION W/ INTRAOCULAR LENS  IMPLANT, BILATERAL Bilateral   . JOINT REPLACEMENT    . REVERSE SHOULDER ARTHROPLASTY Left 10/12/2016   Procedure: REVERSE LEFT SHOULDER ARTHROPLASTY;  Surgeon: 12/13/2016, MD;  Location: [facility] OR;  Service: Orthopedics;  Laterality: Left;  . REVERSE SHOULDER ARTHROPLASTY Right 08/09/2017   Procedure: RIGHT REVERSE SHOULDER ARTHROPLASTY;  Surgeon: 10/09/2017, MD;  Location: [facility] OR;  Service: Orthopedics;  Laterality: Right;  . SHOULDER ARTHROSCOPY Right 2003  . TOOTH EXTRACTION     "1 tooth" (08/09/2017)  . TOTAL KNEE ARTHROPLASTY Bilateral      Family History  Problem Relation Age of Onset  . Heart attack Mother   . Heart disease Mother   . Hypertension Father   .  Heart failure Father   . Kidney failure Father   . Hyperlipidemia Father   . Stroke Father   . Hypertension Sister   . Hyperlipidemia Sister      Social History   Substance and Sexual Activity  Drug Use Never     Social History   Substance and Sexual Activity  Alcohol Use Never     Social History   Tobacco Use  Smoking Status Never Smoker  Smokeless Tobacco Never Used     Outpatient Encounter Medications as of 05/12/2019  Medication Sig  . acetaminophen (TYLENOL) 325 MG tablet Take 650 mg by mouth 4 (four) times daily as needed (for pain.).  Marland Kitchen amLODipine (NORVASC) 2.5 MG tablet Take 1 tablet (2.5 mg total) by  mouth every evening.  Marland Kitchen atorvastatin (LIPITOR) 40 MG tablet Take 1 tablet (40 mg total) by mouth daily at 6 PM.  . carvedilol (COREG) 6.25 MG tablet Take 1 tablet (6.25 mg total) by mouth 2 (two) times daily.  . cetirizine (ZYRTEC) 10 MG tablet Take 10 mg by mouth daily.  . Coenzyme Q10 ([employer] COENZYME Q-10) 100 MG capsule Take 200 mg by mouth daily.  . ferrous sulfate 325 (65 FE) MG tablet Take 1 tablet (325 mg total) by mouth daily with breakfast. Take on Monday, Wednesday, Fridays  . FLUoxetine (PROZAC) 20 MG capsule Take 1 capsule (20 mg total) by mouth daily.  . fluticasone (FLONASE) 50 MCG/ACT nasal spray USE 2 SPRAYS INTO BOTH  NOSTRILS DAILY AS NEEDED  FOR ALLERGIES  . hydrochlorothiazide (HYDRODIURIL) 25 MG tablet Take 1 tablet (25 mg total) by mouth daily.  . naproxen (NAPROSYN) 500 MG tablet TAKE 1 TABLET BY MOUTH TWO  TIMES DAILY WITH A MEAL  . omeprazole (PRILOSEC) 20 MG capsule TAKE 1 CAPSULE BY MOUTH  DAILY  . Polyethyl Glycol-Propyl Glycol (LUBRICANT EYE DROPS) 0.4-0.3 % SOLN Place 1 drop into both eyes 3 (three) times daily as needed (for dry/irritated eyes.).  Marland Kitchen triamcinolone cream (KENALOG) 0.1 % Apply 1 application topically 2 (two) times daily.  . [DISCONTINUED] FLUoxetine (PROZAC) 20 MG capsule TAKE 1 CAPSULE BY MOUTH  DAILY   No facility-administered encounter medications on file as of 05/12/2019.    Allergies: Penicillins  Body mass index is 40.8 kg/m.  Temperature 98 F (36.7 C), temperature source Oral, height 5' 4.5" (1.638 m), weight 241 lb 6.4 oz (109.5 kg), SpO2 96 %.     Review of Systems  Constitutional: Positive for fatigue. Negative for activity change, appetite change, chills, diaphoresis, fever and unexpected weight change.  HENT: Negative for congestion, ear pain, postnasal drip, sinus pressure and sore throat.   Eyes: Negative for visual disturbance.  Respiratory: Negative for cough, chest tightness, shortness of breath and wheezing.    Cardiovascular: Negative for chest pain, palpitations and leg swelling.  Gastrointestinal: Negative for abdominal distention, abdominal pain, blood in stool, constipation, nausea and vomiting.  Endocrine: Negative for polydipsia, polyphagia and polyuria.  Genitourinary: Negative for difficulty urinating, flank pain and hematuria.  Musculoskeletal: Positive for arthralgias, gait problem, myalgias, neck pain and neck stiffness.  Neurological: Positive for dizziness and headaches.  Hematological: Negative for adenopathy. Does not bruise/bleed easily.       Objective:   Physical Exam Vitals reviewed.  Constitutional:      General: She is not in acute distress.    Appearance: She is obese. She is not ill-appearing, toxic-appearing or diaphoretic.  HENT:     Head: Normocephalic and atraumatic.  Right Ear: Decreased hearing noted. Tympanic membrane is bulging. Tympanic membrane is not erythematous.     Left Ear: Decreased hearing noted. Tympanic membrane is bulging. Tympanic membrane is not erythematous.     Nose: Rhinorrhea present. No mucosal edema.     Right Turbinates: Not swollen.     Left Turbinates: Not swollen.     Right Sinus: No maxillary sinus tenderness or frontal sinus tenderness.     Left Sinus: No maxillary sinus tenderness or frontal sinus tenderness.  Eyes:     Extraocular Movements: Extraocular movements intact.     Conjunctiva/sclera: Conjunctivae normal.     Pupils: Pupils are equal, round, and reactive to light.  Cardiovascular:     Rate and Rhythm: Normal rate and regular rhythm.     Pulses: Normal pulses.     Heart sounds: Murmur present.  Pulmonary:     Effort: Pulmonary effort is normal. No respiratory distress.     Breath sounds: Normal breath sounds. No stridor. No wheezing, rhonchi or rales.  Chest:     Chest wall: No tenderness.  Musculoskeletal:     Cervical back: Normal range of motion and neck supple. No tenderness.  Lymphadenopathy:      Cervical: No cervical adenopathy.  Skin:    General: Skin is warm and dry.  Neurological:     Mental Status: She is alert and oriented to person, place, and time.     Coordination: Romberg sign negative. Heel to [facility] Test normal.  Psychiatric:        Mood and Affect: Mood normal.        Behavior: Behavior normal.        Thought Content: Thought content normal.        Judgment: Judgment normal.        Assessment & Plan:   1. Dizziness   2. Hypertension, unspecified type   3. Healthcare maintenance   4. Fluid level behind tympanic membrane of both ears     Dizziness Remain well hydrated, drink enough water until your urine is clear. Your blood pressure is elevated and with your headache- recommend that you follow-up with your cardiologist. If you develop chest pain or tightness with exertion or increase in shortness of breath- seek immediate medical assistance. A1c (average of your blood sugar over the last 3 months) stable at 5.2 We will call you with lab results. If you develop cough, nasal drainage, sore throat, increase in body aches, loss of sense or smell-please be tested for Corona Virus. To reduce fluid behind your ears- OTC antihistamine. Continue to social distance and wear a mask when in public. Follow-up with primary care in 3 months.  HTN (hypertension) BP elevated with HA- f/u with Cards She denies CP/tightness with exertion Currently on amlodipine 2.5mg  QD, Carvedilol 6.25mg  BID, HCTZ 25mg  QD  Healthcare maintenance Remain well hydrated, drink enough water until your urine is clear. Your blood pressure is elevated and with your headache- recommend that you follow-up with your cardiologist. If you develop chest pain or tightness with exertion or increase in shortness of breath- seek immediate medical assistance. A1c (average of your blood sugar over the last 3 months) stable at 5.2 We will call you with lab results. If you develop cough, nasal drainage, sore  throat, increase in body aches, loss of sense or smell-please be tested for Corona Virus. To reduce fluid behind your ears- OTC antihistamine. Continue to social distance and wear a mask when in public. Follow-up with primary care  in 3 months.  Fluid level behind tympanic membrane of both ears OTC antihistamine     FOLLOW-UP:  Return if symptoms worsen or fail to improve.

## 2019-05-12 NOTE — Telephone Encounter (Signed)
Patient called office with complaint of Dizziness and headache x 2 days. BP this morning is 153/79. No nausea, no vomiting, no injury to head, no known exposure to COVID, no other symptoms.   I spoke with Orpha Bur who advised that patient call her Cardiologist (they are the ones that manage her BP) and see what they advise and if they do not think its BP related for patient to call office back to schedule apt.  Patient was advised of the below and verbalized understanding. AS, CMA

## 2019-05-12 NOTE — Assessment & Plan Note (Signed)
Remain well hydrated, drink enough water until your urine is clear. Your blood pressure is elevated and with your headache- recommend that you follow-up with your cardiologist. If you develop chest pain or tightness with exertion or increase in shortness of breath- seek immediate medical assistance. A1c (average of your blood sugar over the last 3 months) stable at 5.2 We will call you with lab results. If you develop cough, nasal drainage, sore throat, increase in body aches, loss of sense or smell-please be tested for Corona Virus. To reduce fluid behind your ears- OTC antihistamine. Continue to social distance and wear a mask when in public. Follow-up with primary care in 3 months.

## 2019-05-12 NOTE — Telephone Encounter (Signed)
Pt c/o BP issue: STAT if pt c/o blurred vision, one-sided weakness or slurred speech  1. What are your last 5 BP readings? 164/78, 166/83, 159/88 HR 57  2. Are you having any other symptoms (ex. Dizziness, headache, blurred vision, passed out)? Dizziness  3. What is your BP issue? BP is high and fluctuates.

## 2019-05-13 LAB — CBC
Hematocrit: 38.4 % (ref 34.0–46.6)
Hemoglobin: 13.2 g/dL (ref 11.1–15.9)
MCH: 32 pg (ref 26.6–33.0)
MCHC: 34.4 g/dL (ref 31.5–35.7)
MCV: 93 fL (ref 79–97)
Platelets: 341 10*3/uL (ref 150–450)
RBC: 4.13 x10E6/uL (ref 3.77–5.28)
RDW: 12.8 % (ref 11.7–15.4)
WBC: 5.9 10*3/uL (ref 3.4–10.8)

## 2019-05-13 LAB — TSH: TSH: 1.09 u[IU]/mL (ref 0.450–4.500)

## 2019-05-13 NOTE — Telephone Encounter (Signed)
Called patient to assess if SBP readings have been consistently >130 mmHg. Patient states BP is highest in the mornings prior to her taking her medications and improves later in the day. She reports BP of 134/72 as the most recent reading she has taken after taking her medications in the morning. Due to her elevated BP at last visit, I scheduled her to see Dr. Elease Hashimoto tomorrow, 2/10. She was seen by PCP, William Hamburger, NP for fluid in her ear that was causing her dizziness. She verbalized agreement with plan and thanked me for the call.

## 2019-05-14 ENCOUNTER — Other Ambulatory Visit: Payer: Self-pay

## 2019-05-14 ENCOUNTER — Encounter: Payer: Self-pay | Admitting: Cardiovascular Disease

## 2019-05-14 ENCOUNTER — Ambulatory Visit: Payer: Medicare Other | Admitting: Cardiovascular Disease

## 2019-05-14 VITALS — BP 148/86 | HR 96 | Ht 64.5 in | Wt 238.0 lb

## 2019-05-14 DIAGNOSIS — I350 Nonrheumatic aortic (valve) stenosis: Secondary | ICD-10-CM

## 2019-05-14 DIAGNOSIS — I10 Essential (primary) hypertension: Secondary | ICD-10-CM | POA: Diagnosis not present

## 2019-05-14 NOTE — Progress Notes (Signed)
Cardiology Office Note   Date:  05/14/2019   ID:  Tracey Walton, DOB 1940-11-21, MRN [id number]  PCP:  Julaine Fusi, NP  Cardiologist:   Kristeen Miss, MD   Chief Complaint  Patient presents with  . Hypertension   Problem List 1. Essential HTN  2. Chest pressure 3. Arthritis 4. Heart murmur 5. Hyperlipidemia 6. Hiatal hernia    History of Present Illness: Tracey Walton is a 79 y.o. female who presents for further evaluation of her HTN Has had HTN for many years.  Her medical doctors at the no longer [facility] have had difficulty getting her blood pressure controlled. She has occasional episodes of chest pressure. These chest pain  Tend occur with exertion and are relieved with rest. They last for several minutes. There is no radiation.  She has chronic shortness of breath but the chest pains are not necessarily associated with worsening shortness breath.  She does not get any regular exercise. She tries to avoid eating excessive salt but admits that she still eats occasional salty foods.  She's had hyperlipidemia for the past 7 or 8 years. She is currently on Pravachol.  June 24, 2014:   Tracey Walton presents follow up of her HTN, and CP.  She was scheduled for an echo and myoview  Echo showed: normal LV systolic function. She has mild aortic stenosis, trivial aortic insufficiency, and mild mitral regurgitation.  Left ventricle: The cavity size was normal. Systolic function was normal. The estimated ejection fraction was in the range of 55% to 60%. Wall motion was normal; there were no regional wall motion abnormalities. Doppler parameters are consistent with abnormal left ventricular relaxation (grade 1 diastolic dysfunction). - Aortic valve: Cusp separation was reduced. There was very mild stenosis. There was trivial regurgitation. Mean gradient (S): 11 mm Hg. - Mitral valve: There was mild regurgitation  She had a [employer] today.   Initial images look good. No evidence of ischemia  Has been limiting the salt in her diet.    June 8.  2016:  Tracey Walton is doing well. BP is well controlled at home.   Dec. 9, 2016  Doing well.  BP is normal at home.  Is always elevated at the office.  No CP,  Has some DOE with walking .  Does not walk regularly .   October 01, 2015: Doing well. BP is normal at home.   Always elevated in the doctors office. Does not exercise.   Back pain.   Needs to use a walker if she goes for any distance.   Has pain in her back and hands.   May 14, 2019:  Tracey Walton seen today for a follow-up visit.  She has a history of mild aortic stenosis, hypertension, hyperlipidemia. BP at home are better.  She watches her salt  Uses a cane to walk , has some exertional tightness.   Lexiscan Myoview study performed September, 2019 revealed no ischemia.  Her echocardiogram showed some aortic stenosis that perhaps was slightly worse than previous. Some DOE    Past Medical History:  Diagnosis Date  . Anxiety   . Arthritis    "qwhere" (08/09/2017)  . Chest pain 2011   negative for MI  . Chronic kidney disease    renal insufficiency. 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 Chronic lower back pain   . Depression   . Dyspnea    SOB while walking  . GERD (  gastroesophageal reflux disease)   . Heart murmur   . History of hiatal hernia   . Hyperlipidemia   . Hypertension   . Pneumonia ~ 2014  . Polymyalgia (HCC)   . Strep throat     Past Surgical History:  Procedure Laterality Date  . ABDOMINAL HYSTERECTOMY     partial  . BILATERAL CARPAL TUNNEL RELEASE  2003-2005   right-left  . CATARACT EXTRACTION W/ INTRAOCULAR LENS  IMPLANT, BILATERAL Bilateral   . JOINT REPLACEMENT    . REVERSE SHOULDER ARTHROPLASTY Left 10/12/2016   Procedure: REVERSE LEFT SHOULDER ARTHROPLASTY;  Surgeon: Francena Hanly, MD;  Location: [facility] OR;  Service: Orthopedics;  Laterality: Left;  . REVERSE  SHOULDER ARTHROPLASTY Right 08/09/2017   Procedure: RIGHT REVERSE SHOULDER ARTHROPLASTY;  Surgeon: Francena Hanly, MD;  Location: [facility] OR;  Service: Orthopedics;  Laterality: Right;  . SHOULDER ARTHROSCOPY Right 2003  . TOOTH EXTRACTION     "1 tooth" (08/09/2017)  . TOTAL KNEE ARTHROPLASTY Bilateral      Current Outpatient Medications  Medication Sig Dispense Refill  . acetaminophen (TYLENOL) 325 MG tablet Take 650 mg by mouth 4 (four) times daily as needed (for pain.).    Marland Kitchen amLODipine (NORVASC) 2.5 MG tablet Take 1 tablet (2.5 mg total) by mouth every evening. 90 tablet 3  . atorvastatin (LIPITOR) 40 MG tablet Take 1 tablet (40 mg total) by mouth daily at 6 PM. 90 tablet 3  . carvedilol (COREG) 6.25 MG tablet Take 1 tablet (6.25 mg total) by mouth 2 (two) times daily. 180 tablet 3  . cetirizine (ZYRTEC) 10 MG tablet Take 10 mg by mouth daily.    . Coenzyme Q10 ([employer]S COENZYME Q-10) 100 MG capsule Take 200 mg by mouth daily.    . ferrous sulfate 325 (65 FE) MG tablet Take 1 tablet (325 mg total) by mouth daily with breakfast. Take on Monday, Wednesday, Fridays 90 tablet 1  . FLUoxetine (PROZAC) 20 MG capsule Take 1 capsule (20 mg total) by mouth daily. 90 capsule 0  . fluticasone (FLONASE) 50 MCG/ACT nasal spray USE 2 SPRAYS INTO BOTH  NOSTRILS DAILY AS NEEDED  FOR ALLERGIES 48 g 0  . hydrochlorothiazide (HYDRODIURIL) 25 MG tablet Take 1 tablet (25 mg total) by mouth daily. 90 tablet 3  . naproxen (NAPROSYN) 500 MG tablet TAKE 1 TABLET BY MOUTH TWO  TIMES DAILY WITH A MEAL 180 tablet 0  . omeprazole (PRILOSEC) 20 MG capsule TAKE 1 CAPSULE BY MOUTH  DAILY 90 capsule 0  . Polyethyl Glycol-Propyl Glycol (LUBRICANT EYE DROPS) 0.4-0.3 % SOLN Place 1 drop into both eyes 3 (three) times daily as needed (for dry/irritated eyes.).    Marland Kitchen triamcinolone cream (KENALOG) 0.1 % Apply 1 application topically 2 (two) times daily. 30 g 0   No current facility-administered medications for this visit.    Allergies:    Penicillins    Social History:  The patient  reports that she has never smoked. She has never used smokeless tobacco. She reports that she does not drink alcohol or use drugs.   Family History:  The patient's family history includes Heart attack in her mother; Heart disease in her mother; Heart failure in her father; Hyperlipidemia in her father and sister; Hypertension in her father and sister; Kidney failure in her father; Stroke in her father.    ROS:  Please see the history of present illness.     Physical Exam: Blood pressure (!) 148/86, pulse 96, height 5' 4.5" (1.638 m),  weight 238 lb (108 kg), SpO2 96 %.  GEN:  Morbidly obese female,  NAD ,   Very limited mobility ,  Has difficulty in getting up on the exam table, Body mass index is 40.22 kg/m.  HEENT: Normal NECK: No JVD; No carotid bruits LYMPHATICS: No lymphadenopathy CARDIAC: RRR,  2/6 systolic murmur,  Soft diastolic murur  RESPIRATORY:  Clear to auscultation without rales, wheezing or rhonchi  ABDOMEN: Soft, non-tender, non-distended MUSCULOSKELETAL:  No edema; No deformity  SKIN: Warm and dry NEUROLOGIC:  Alert and oriented x 3   EKG: May 14, 2019: Normal sinus rhythm with a heart rate of 82.  She has no ST or T wave changes.  Recent Labs: 01/02/2019: ALT 13; BUN 22; Creatinine, Ser 1.02; Potassium 5.0; Sodium 136 05/12/2019: Hemoglobin 13.2; Platelets 341; TSH 1.090    Lipid Panel    Component Value Date/Time   CHOL 181 01/02/2019 0933   TRIG 95 01/02/2019 0933   HDL 41 01/02/2019 0933   CHOLHDL 4.4 01/02/2019 0933   CHOLHDL 3.6 10/01/2015 0746   VLDL 14 10/01/2015 0746   LDLCALC 123 (H) 01/02/2019 0933      Wt Readings from Last 3 Encounters:  05/14/19 238 lb (108 kg)  05/12/19 241 lb 6.4 oz (109.5 kg)  02/12/19 234 lb (106.1 kg)      Other studies Reviewed: Additional studies/ records that were reviewed today include: . Review of the above records demonstrates:    ASSESSMENT AND  PLAN:  1. Essential HTN  - .BP is ok at home.  Mildly elevated here  2. Chest pressure-  Has occasional episodes of CP.   Has had several Lexiscan myoivew studies.   No ischemia   3. Aortic stenosis:   Has moderate aortic stenosis and mild aortic insufficiency by echocardiogram in September, 2019. With her symptoms, I am concerned that she may have worsening aortic stenosis.  We will repeat her echocardiogram.  4. Hyperlipidemia -   Her last LDL is 123   .   Advised her to work hard at diet and weight loss.   Current medicines are reviewed at length with the patient today.  The patient does not have concerns regarding medicines.  The following changes have been made:  no change   Disposition:   FU with APP in 6 months     Signed, Mertie Moores, MD  05/14/2019 1:53 PM    [facility] [address], [place], [place]  [address] Phone: [phone]; Fax: [phone]

## 2019-05-14 NOTE — Patient Instructions (Signed)
Medication Instructions:  Your physician recommends that you continue on your current medications as directed. Please refer to the Current Medication list given to you today.  *If you need a refill on your cardiac medications before your next appointment, please call your pharmacy*  Lab Work: None If you have labs (blood work) drawn today and your tests are completely normal, you will receive your results only by: Marland Kitchen MyChart Message (if you have MyChart) OR . A paper copy in the mail If you have any lab test that is abnormal or we need to change your treatment, we will call you to review the results.  Testing/Procedures: Your physician has requested that you have an echocardiogram. Echocardiography is a painless test that uses sound waves to create images of your heart. It provides your doctor with information about the size and shape of your heart and how well your heart's chambers and valves are working. This procedure takes approximately one hour. There are no restrictions for this procedure.   Follow-Up: At [facility], you and your health needs are our priority.  As part of our continuing mission to provide you with exceptional heart care, we have created designated Provider Care Teams.  These Care Teams include your primary Cardiologist (physician) and Advanced Practice Providers (APPs -  Physician Assistants and Nurse Practitioners) who all work together to provide you with the care you need, when you need it.  Your next appointment:   6 month(s)  The format for your next appointment:   In Person  Provider:   You will see one of the following Advanced Practice Providers on your designated Care Team:    Tereso Newcomer, PA-C  [place]in [place], [place]  Berton Bon, NP   Other Instructions

## 2019-05-18 ENCOUNTER — Other Ambulatory Visit: Payer: Self-pay | Admitting: Adult Health

## 2019-05-18 DIAGNOSIS — I10 Essential (primary) hypertension: Secondary | ICD-10-CM

## 2019-05-18 DIAGNOSIS — E785 Hyperlipidemia, unspecified: Secondary | ICD-10-CM

## 2019-05-28 ENCOUNTER — Other Ambulatory Visit (HOSPITAL_COMMUNITY): Payer: Medicare Other

## 2019-05-28 ENCOUNTER — Ambulatory Visit (HOSPITAL_COMMUNITY): Payer: Medicare Other | Attending: Cardiology

## 2019-05-28 ENCOUNTER — Other Ambulatory Visit: Payer: Self-pay

## 2019-05-28 DIAGNOSIS — I350 Nonrheumatic aortic (valve) stenosis: Secondary | ICD-10-CM | POA: Diagnosis not present

## 2019-06-17 DIAGNOSIS — G894 Chronic pain syndrome: Secondary | ICD-10-CM | POA: Diagnosis not present

## 2019-06-17 DIAGNOSIS — Z79891 Long term (current) use of opiate analgesic: Secondary | ICD-10-CM | POA: Diagnosis not present

## 2019-06-17 DIAGNOSIS — M5416 Radiculopathy, lumbar region: Secondary | ICD-10-CM | POA: Diagnosis not present

## 2019-07-10 ENCOUNTER — Ambulatory Visit: Payer: Medicare Other | Admitting: Cardiovascular Disease

## 2019-07-11 ENCOUNTER — Other Ambulatory Visit: Payer: Self-pay | Admitting: Adult Health

## 2019-07-15 ENCOUNTER — Other Ambulatory Visit: Payer: Self-pay | Admitting: Adult Health

## 2019-07-27 ENCOUNTER — Other Ambulatory Visit: Payer: Self-pay | Admitting: Adult Health

## 2019-09-17 ENCOUNTER — Other Ambulatory Visit: Payer: Self-pay | Admitting: Physician Assistant

## 2019-09-18 ENCOUNTER — Telehealth: Payer: Self-pay | Admitting: Physician Assistant

## 2019-09-18 NOTE — Telephone Encounter (Signed)
Please call patient to schedule appointment to follow up for further refills.   AS, CMA

## 2019-09-29 DIAGNOSIS — M5416 Radiculopathy, lumbar region: Secondary | ICD-10-CM | POA: Diagnosis not present

## 2019-09-29 DIAGNOSIS — Z5181 Encounter for therapeutic drug level monitoring: Secondary | ICD-10-CM | POA: Diagnosis not present

## 2019-09-29 DIAGNOSIS — Z79899 Other long term (current) drug therapy: Secondary | ICD-10-CM | POA: Diagnosis not present

## 2019-09-29 DIAGNOSIS — M62830 Muscle spasm of back: Secondary | ICD-10-CM | POA: Diagnosis not present

## 2019-10-12 ENCOUNTER — Other Ambulatory Visit (INDEPENDENT_AMBULATORY_CARE_PROVIDER_SITE_OTHER): Payer: Self-pay | Admitting: Family Medicine

## 2019-10-12 DIAGNOSIS — I10 Essential (primary) hypertension: Secondary | ICD-10-CM

## 2019-10-12 DIAGNOSIS — E785 Hyperlipidemia, unspecified: Secondary | ICD-10-CM

## 2019-10-20 ENCOUNTER — Telehealth: Payer: Self-pay | Admitting: Physician Assistant

## 2019-10-20 DIAGNOSIS — M79606 Pain in leg, unspecified: Secondary | ICD-10-CM

## 2019-10-20 DIAGNOSIS — G2581 Restless legs syndrome: Secondary | ICD-10-CM

## 2019-10-20 NOTE — Telephone Encounter (Signed)
Patient is requesting a referral/recomendation for a specialist to address her bilateral leg tingling/RLS. Please place referral if appropriate.

## 2019-10-20 NOTE — Addendum Note (Signed)
Addended by: Sylvester Harder on: 10/20/2019 01:50 PM   Modules accepted: Orders

## 2019-10-20 NOTE — Telephone Encounter (Signed)
Spoke with Kandis Cocking who gave verbal order for referral to neurology for RLS and Leg pain.   Referral has been placed.

## 2019-10-27 ENCOUNTER — Other Ambulatory Visit: Payer: Self-pay | Admitting: Physician Assistant

## 2019-11-26 ENCOUNTER — Encounter: Payer: Self-pay | Admitting: Physician Assistant

## 2019-11-26 ENCOUNTER — Other Ambulatory Visit: Payer: Self-pay

## 2019-11-26 ENCOUNTER — Ambulatory Visit: Payer: Medicare Other | Admitting: Physician Assistant

## 2019-11-26 VITALS — BP 132/80 | HR 72 | Ht 64.0 in | Wt 223.0 lb

## 2019-11-26 DIAGNOSIS — I10 Essential (primary) hypertension: Secondary | ICD-10-CM | POA: Diagnosis not present

## 2019-11-26 DIAGNOSIS — I350 Nonrheumatic aortic (valve) stenosis: Secondary | ICD-10-CM | POA: Diagnosis not present

## 2019-11-26 DIAGNOSIS — E782 Mixed hyperlipidemia: Secondary | ICD-10-CM

## 2019-11-26 NOTE — Patient Instructions (Signed)
Medication Instructions:  Your physician recommends that you continue on your current medications as directed. Please refer to the Current Medication list given to you today.  *If you need a refill on your cardiac medications before your next appointment, please call your pharmacy*  Lab Work: None ordered today  If you have labs (blood work) drawn today and your tests are completely normal, you will receive your results only by: Marland Kitchen MyChart Message (if you have MyChart) OR . A paper copy in the mail If you have any lab test that is abnormal or we need to change your treatment, we will call you to review the results.  Testing/Procedures: None ordered today  Follow-Up: On 05/31/20 at 11:20AM with Kristeen Miss, MD

## 2019-11-26 NOTE — Progress Notes (Signed)
Cardiology Office Note:    Date:  11/26/2019   ID:  JULAINE ZIMNY, DOB Jan 27, 1941, MRN [id number]  PCP:  Mayer Masker, PA-C  Cardiologist:  Kristeen Miss, MD   Electrophysiologist:  None   Referring MD: Mayer Masker, PA-C   Chief Complaint:  Follow-up (aortic stenosis)    Patient Profile:    LASONDRA HODGKINS is a 79 y.o. female with:   Aortic stenosis   Echocardiogram 9/19: mean 22 mmHg  Echocardiogram 2/21: mean 17 mmHg, DI 0.54  Hypertension   Hyperlipidemia   Prior CV studies: Echocardiogram 05/28/2019 Large hiatal hernia in parasternal views; EF 55-60, normal wall motion, moderate LVH, GR 1 DD, normal RVSF, moderate LAE, mild to moderate MR, mild AI, mild aortic stenosis (mean 17 mmHg), RVSP 36.3  ABIs 01/14/2018 Normal  Myoview 12/28/2017 EF 69, no ischemia; low risk  Echocardiogram 12/27/2017 Mild concentric LVH, EF 60-65, normal wall motion, GRII DD, mild aortic stenosis (mean 22 mmHg), mild aortic insufficiency, mild to moderate mitral regurgitation, mild LAE  History of Present Illness:    Ms. Tabares was last seen by Dr. Elease Hashimoto in February 2021.  She returns for follow-up.  She had a follow-up echocardiogram in February.  This demonstrated normal LV function, mild diastolic dysfunction and mild aortic stenosis.  Today, she notes she is doing well without chest discomfort, significant shortness of breath, orthopnea, syncope or leg swelling.  She does have some weakness on the left and now walks with a cane.  She also has some muscle wasting in her lower leg and is currently being evaluated by neurology.       Past Medical History:  Diagnosis Date  . Anxiety   . Arthritis    "qwhere" (08/09/2017)  . Chest pain 2011   negative for MI  . Chronic kidney disease    renal insufficiency. 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 Chronic lower back pain   . Depression   . Dyspnea    SOB while walking  . GERD (gastroesophageal  reflux disease)   . Heart murmur   . History of hiatal hernia   . Hyperlipidemia   . Hypertension   . Pneumonia ~ 2014  . Polymyalgia (HCC)   . Strep throat     Current Medications: Current Meds  Medication Sig  . acetaminophen (TYLENOL) 325 MG tablet Take 650 mg by mouth 4 (four) times daily as needed (for pain.).  Marland Kitchen amLODipine (NORVASC) 2.5 MG tablet Take 1 tablet (2.5 mg total) by mouth every evening.  Marland Kitchen atorvastatin (LIPITOR) 40 MG tablet Take 1 tablet (40 mg total) by mouth daily at 6 PM.  . carvedilol (COREG) 6.25 MG tablet Take 1 tablet (6.25 mg total) by mouth 2 (two) times daily.  . cetirizine (ZYRTEC) 10 MG tablet Take 10 mg by mouth daily.  . Coenzyme Q10 ([employer]S COENZYME Q-10) 100 MG capsule Take 200 mg by mouth daily.  . ferrous sulfate 325 (65 FE) MG tablet Take 1 tablet (325 mg total) by mouth daily with breakfast. Take on Monday, Wednesday, Fridays  . FLUoxetine (PROZAC) 20 MG capsule TAKE 1 CAPSULE BY MOUTH  DAILY  . fluticasone (FLONASE) 50 MCG/ACT nasal spray USE 2 SPRAYS INTO BOTH  NOSTRILS DAILY AS NEEDED  FOR ALLERGIES  . hydrochlorothiazide (HYDRODIURIL) 25 MG tablet Take 1 tablet (25 mg total) by mouth daily.  Marland Kitchen HYDROcodone-acetaminophen (NORCO/VICODIN) 5-325 MG tablet Take 1 tablet by mouth every 6 (six) hours  as needed for moderate pain or severe pain.   . naproxen (NAPROSYN) 500 MG tablet TAKE 1 TABLET BY MOUTH TWO  TIMES DAILY WITH A MEAL  . omeprazole (PRILOSEC) 20 MG capsule TAKE 1 CAPSULE BY MOUTH  DAILY  . Polyethyl Glycol-Propyl Glycol (LUBRICANT EYE DROPS) 0.4-0.3 % SOLN Place 1 drop into both eyes 3 (three) times daily as needed (for dry/irritated eyes.).  Marland Kitchen triamcinolone cream (KENALOG) 0.1 % Apply 1 application topically 2 (two) times daily.     Allergies:   Penicillins   Social History   Tobacco Use  . Smoking status: Never Smoker  . Smokeless tobacco: Never Used  Vaping Use  . Vaping Use: Never used  Substance Use Topics  . Alcohol use:  Never  . Drug use: Never     Family Hx: The patient's family history includes Heart attack in her mother; Heart disease in her mother; Heart failure in her father; Hyperlipidemia in her father and sister; Hypertension in her father and sister; Kidney failure in her father; Stroke in her father.  ROS   EKGs/Labs/Other Test Reviewed:    EKG:  EKG is   ordered today.  The ekg ordered today demonstrates normal sinus rhythm, heart rate 72, normal axis, PAC, no ST-T wave changes, QTC 427  Recent Labs: 01/02/2019: ALT 13; BUN 22; Creatinine, Ser 1.02; Potassium 5.0; Sodium 136 05/12/2019: Hemoglobin 13.2; Platelets 341; TSH 1.090   Recent Lipid Panel Lab Results  Component Value Date/Time   CHOL 181 01/02/2019 09:33 AM   TRIG 95 01/02/2019 09:33 AM   HDL 41 01/02/2019 09:33 AM   CHOLHDL 4.4 01/02/2019 09:33 AM   CHOLHDL 3.6 10/01/2015 07:46 AM   LDLCALC 123 (H) 01/02/2019 09:33 AM    Physical Exam:    VS:  BP 132/80   Pulse 72   Ht 5\' 4"  (1.626 m)   Wt 223 lb (101.2 kg)   SpO2 93%   BMI 38.28 kg/m     Wt Readings from Last 3 Encounters:  11/26/19 223 lb (101.2 kg)  05/14/19 238 lb (108 kg)  05/12/19 241 lb 6.4 oz (109.5 kg)     Constitutional:      Appearance: Healthy appearance. Not in distress.  Pulmonary:     Effort: Pulmonary effort is normal.     Breath sounds: No wheezing. No rales.  Cardiovascular:     Normal rate. Regular rhythm. Normal S1. Normal S2.     Murmurs: There is a grade 2/6 crescendo-decrescendo systolic murmur at the URSB.  Edema:    Peripheral edema absent.  Abdominal:     Palpations: Abdomen is soft.  Musculoskeletal:     Cervical back: Neck supple. Skin:    General: Skin is warm and dry.  Neurological:     Mental Status: Alert and oriented to person, place and time.     Cranial Nerves: Cranial nerves are intact.      ASSESSMENT & PLAN:    1. Aortic valve stenosis, etiology of cardiac valve disease unspecified Stage B mild aortic  stenosis by most recent echocardiogram.  Plan repeat echocardiogram in 3-5 years unless she has symptoms to suggest worsening.    2. Essential hypertension The patient's blood pressure is controlled on her current regimen.  Continue current therapy.    3. Mixed hyperlipidemia The 10-year ASCVD risk score 07/10/19 DC Jr., et al., 2013) is: 28.8%   Values used to calculate the score:     Age: 86 years  Sex: Female     Is Non-Hispanic African American: No     Diabetic: No     Tobacco smoker: No     Systolic Blood Pressure: 132 mmHg     Is BP treated: Yes     HDL Cholesterol: 41 mg/dL     Total Cholesterol: 181 mg/dL  She is on high intensity statin and has had at least 30% reduction in LDL over the years.  She has labs soon with her PCP.  Continue current dose of Atorvastatin.     Dispo:  Return in about 6 months (around 05/28/2020) for Routine Follow Up, w/ Dr. Elease Hashimoto, in person.   Medication Adjustments/Labs and Tests Ordered: Current medicines are reviewed at length with the patient today.  Concerns regarding medicines are outlined above.  Tests Ordered: Orders Placed This Encounter  Procedures  . EKG 12-Lead   Medication Changes: No orders of the defined types were placed in this encounter.   Signed, Tereso Newcomer, PA-C  11/26/2019 1:00 PM    [facility] [address] [place], [place], [place]  [address] Phone: [phone]; Fax: [phone]

## 2019-12-03 ENCOUNTER — Other Ambulatory Visit: Payer: Self-pay

## 2019-12-03 ENCOUNTER — Encounter: Payer: Self-pay | Admitting: Physician Assistant

## 2019-12-03 ENCOUNTER — Ambulatory Visit (INDEPENDENT_AMBULATORY_CARE_PROVIDER_SITE_OTHER): Payer: Medicare Other | Admitting: Physician Assistant

## 2019-12-03 VITALS — BP 144/83 | HR 69 | Ht 62.5 in | Wt 225.0 lb

## 2019-12-03 DIAGNOSIS — Z0000 Encounter for general adult medical examination without abnormal findings: Secondary | ICD-10-CM | POA: Diagnosis not present

## 2019-12-03 NOTE — Progress Notes (Signed)
Subjective:   Tracey Walton is a 79 y.o. female who presents for Medicare Annual (Subsequent) preventive examination.  Review of Systems     Review of Systems: General:   No F/C, wt loss Pulm:   No DIB, SOB, pleuritic chest pain Card:  No CP, palpitations Abd:  No n/v/d or pain Ext:  No inc edema from baseline  Objective:    Today's Vitals   12/03/19 1312 12/03/19 1407  BP: (!) 143/69 (!) 144/83  Pulse: 69   SpO2: 93%   Weight: 225 lb (102.1 kg)   Height: 5' 2.5" (1.588 m)    Body mass index is 40.5 kg/m.  Advanced Directives 11/07/2017 08/09/2017 08/09/2017 07/27/2017 10/12/2016 10/09/2016 12/11/2015  Does Patient Have a Medical Advance Directive? No - Yes Yes Yes Yes Yes  Type of Advance Directive - Healthcare Power of [place];Living will Healthcare Power of [place];Living will Healthcare Power of [place];Living will Living will Living will Living will  Does patient want to make changes to medical advance directive? - - No - Patient declined - No - Patient declined No - Patient declined -  Copy of Healthcare Power of Attorney in Chart? - No - copy requested - No - copy requested - - -  Would patient like information on creating a medical advance directive? No - Patient declined - - - - - -    Current Medications (verified) Outpatient Encounter Medications as of 12/03/2019  Medication Sig  . acetaminophen (TYLENOL) 325 MG tablet Take 650 mg by mouth 4 (four) times daily as needed (for pain.).  Marland Kitchen amLODipine (NORVASC) 2.5 MG tablet Take 1 tablet (2.5 mg total) by mouth every evening.  Marland Kitchen atorvastatin (LIPITOR) 40 MG tablet Take 1 tablet (40 mg total) by mouth daily at 6 PM.  . carvedilol (COREG) 6.25 MG tablet Take 1 tablet (6.25 mg total) by mouth 2 (two) times daily.  . cetirizine (ZYRTEC) 10 MG tablet Take 10 mg by mouth daily.  . Coenzyme Q10 ([employer]S COENZYME Q-10) 100 MG capsule Take 200 mg by mouth daily.  . ferrous sulfate 325 (65 FE) MG tablet Take 1 tablet (325 mg total)  by mouth daily with breakfast. Take on Monday, Wednesday, Fridays  . FLUoxetine (PROZAC) 20 MG capsule TAKE 1 CAPSULE BY MOUTH  DAILY  . fluticasone (FLONASE) 50 MCG/ACT nasal spray USE 2 SPRAYS INTO BOTH  NOSTRILS DAILY AS NEEDED  FOR ALLERGIES  . hydrochlorothiazide (HYDRODIURIL) 25 MG tablet Take 1 tablet (25 mg total) by mouth daily.  Marland Kitchen HYDROcodone-acetaminophen (NORCO/VICODIN) 5-325 MG tablet Take 1 tablet by mouth every 6 (six) hours as needed for moderate pain or severe pain.   . methocarbamol (ROBAXIN) 500 MG tablet methocarbamol 500 mg tablet  Take 1 tablet twice a day by oral route as needed.  . naproxen (NAPROSYN) 500 MG tablet TAKE 1 TABLET BY MOUTH TWO  TIMES DAILY WITH A MEAL  . omeprazole (PRILOSEC) 20 MG capsule TAKE 1 CAPSULE BY MOUTH  DAILY  . Polyethyl Glycol-Propyl Glycol (LUBRICANT EYE DROPS) 0.4-0.3 % SOLN Place 1 drop into both eyes 3 (three) times daily as needed (for dry/irritated eyes.).  Marland Kitchen triamcinolone cream (KENALOG) 0.1 % Apply 1 application topically 2 (two) times daily. (Patient not taking: Reported on 12/03/2019)   No facility-administered encounter medications on file as of 12/03/2019.    Allergies (verified) Penicillins   History: Past Medical History:  Diagnosis Date  . Anxiety   . Arthritis    "qwhere" (08/09/2017)  .  Chest pain 2011   negative for MI  . Chronic kidney disease    renal insufficiency. 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 Chronic lower back pain   . Depression   . Dyspnea    SOB while walking  . GERD (gastroesophageal reflux disease)   . Heart murmur   . History of hiatal hernia   . Hyperlipidemia   . Hypertension   . Pneumonia ~ 2014  . Polymyalgia (HCC)   . Strep throat    Past Surgical History:  Procedure Laterality Date  . ABDOMINAL HYSTERECTOMY     partial  . BILATERAL CARPAL TUNNEL RELEASE  2003-2005   right-left  . CATARACT EXTRACTION W/ INTRAOCULAR LENS  IMPLANT, BILATERAL Bilateral    . JOINT REPLACEMENT    . REVERSE SHOULDER ARTHROPLASTY Left 10/12/2016   Procedure: REVERSE LEFT SHOULDER ARTHROPLASTY;  Surgeon: Francena Hanly, MD;  Location: [facility] OR;  Service: Orthopedics;  Laterality: Left;  . REVERSE SHOULDER ARTHROPLASTY Right 08/09/2017   Procedure: RIGHT REVERSE SHOULDER ARTHROPLASTY;  Surgeon: Francena Hanly, MD;  Location: [facility] OR;  Service: Orthopedics;  Laterality: Right;  . SHOULDER ARTHROSCOPY Right 2003  . TOOTH EXTRACTION     "1 tooth" (08/09/2017)  . TOTAL KNEE ARTHROPLASTY Bilateral    Family History  Problem Relation Age of Onset  . Heart attack Mother   . Heart disease Mother   . Hypertension Father   . Heart failure Father   . Kidney failure Father   . Hyperlipidemia Father   . Stroke Father   . Hypertension Sister   . Hyperlipidemia Sister    Social History   Socioeconomic History  . Marital status: Widowed    Spouse name: Not on file  . Number of children: Not on file  . Years of education: Not on file  . Highest education level: Not on file  Occupational History  . Not on file  Tobacco Use  . Smoking status: Never Smoker  . Smokeless tobacco: Never Used  Vaping Use  . Vaping Use: Never used  Substance and Sexual Activity  . Alcohol use: Never  . Drug use: Never  . Sexual activity: Not Currently    Birth control/protection: None  Other Topics Concern  . Not on file  Social History Narrative  . Not on file   Social Determinants of Health   Financial Resource Strain:   . Difficulty of Paying Living Expenses: Not on file  Food Insecurity:   . Worried About Programme researcher, broadcasting/film/video in the Last Year: Not on file  . Ran Out of Food in the Last Year: Not on file  Transportation Needs:   . Lack of Transportation (Medical): Not on file  . Lack of Transportation (Non-Medical): Not on file  Physical Activity:   . Days of Exercise per Week: Not on file  . Minutes of Exercise per Session: Not on file  Stress:   . Feeling of Stress : Not on  file  Social Connections:   . Frequency of Communication with Friends and Family: Not on file  . Frequency of Social Gatherings with Friends and Family: Not on file  . Attends Religious Services: Not on file  . Active Member of Clubs or Organizations: Not on file  . Attends Banker Meetings: Not on file  . Marital Status: Not on file    Tobacco Counseling Counseling given: Not Answered    Diabetic?no   Activities of Daily  Living In your present state of health, do you have any difficulty performing the following activities: 12/03/2019  Hearing? N  Difficulty concentrating or making decisions? N  Walking or climbing stairs? Y  Dressing or bathing? N  Doing errands, shopping? N  Some recent data might be hidden    Patient Care Team: Mayer Masker, PA-C as PCP - General (Physician Assistant) Nahser, Deloris Ping, MD as PCP - Cardiology (Cardiology)  Indicate any recent Medical Services you may have received from other than [facility] providers in the past year (date may be approximate).     Assessment:   This is a routine wellness examination for [place].  Hearing/Vision screen No exam data present  Dietary issues and exercise activities discussed: -Follow a heart healthy diet and stay as active as possible (currently not very active due to arthralgias).  Goals   None    Depression Screen PHQ 2/9 Scores 12/03/2019 12/30/2018 03/14/2018 11/07/2017  PHQ - 2 Score 4 0 2 2  PHQ- 9 Score 10 4 11 15     Fall Risk Fall Risk  12/03/2019 11/07/2017  Falls in the past year? 1 Yes  Number falls in past yr: 1 2 or more  Injury with Fall? 1 Yes  Follow up Falls evaluation completed -    Any stairs in or around the home? No  If so, are there any without handrails? No  Home free of loose throw rugs in walkways, pet beds, electrical cords, etc? Yes  Adequate lighting in your home to reduce risk of falls? Yes   ASSISTIVE DEVICES UTILIZED TO PREVENT FALLS:  Life alert? Yes   Use of a cane, walker or w/c? No  Grab bars in the bathroom? Yes  Shower chair or bench in shower? No  Elevated toilet seat or a handicapped toilet? Yes   TIMED UP AND GO:  Was the test performed? Yes .  Length of time to ambulate 10 feet: 13 sec.   Gait slow and steady without use of assistive device  Cognitive Function:   6CIT Screen 12/03/2019  What Year? 0 points  What month? 0 points  What time? 0 points  Count back from 20 2 points  Months in reverse 0 points  Repeat phrase 6 points  Total Score 8    Immunizations Immunization History  Administered Date(s) Administered  . Influenza, High Dose Seasonal PF 02/21/2018, 12/21/2018  . Influenza,inj,quad, With Preservative 02/11/2017  . Pneumococcal Conjugate-13 03/19/2016  . Pneumococcal Polysaccharide-23 11/07/2017, 11/14/2017  . Zoster 12/16/2011    TDAP status: Due, Education has been provided regarding the importance of this vaccine. Advised may receive this vaccine at local pharmacy or Health Dept. Aware to provide a copy of the vaccination record if obtained from local pharmacy or Health Dept. Verbalized acceptance and understanding. Flu Vaccine status: Declined, Education has been provided regarding the importance of this vaccine but patient still declined. Advised may receive this vaccine at local pharmacy or Health Dept. Aware to provide a copy of the vaccination record if obtained from local pharmacy or Health Dept. Verbalized acceptance and understanding. Pneumococcal vaccine status: Up to date Covid-19 vaccine status: Completed vaccines  Qualifies for Shingles Vaccine? Yes   Zostavax completed No   Shingrix Completed?: No.    Education has been provided regarding the importance of this vaccine. Patient has been advised to call insurance company to determine out of pocket expense if they have not yet received this vaccine. Advised may also receive vaccine at local pharmacy or  Health Dept. Verbalized acceptance and  understanding.  Screening Tests Health Maintenance  Topic Date Due  . COVID-19 Vaccine (1) Never done  . TETANUS/TDAP  Never done  . DEXA SCAN  Never done  . INFLUENZA VACCINE  11/02/2019  . Hepatitis C Screening  Completed  . PNA vac Low Risk Adult  Completed    Health Maintenance  Health Maintenance Due  Topic Date Due  . COVID-19 Vaccine (1) Never done  . TETANUS/TDAP  Never done  . DEXA SCAN  Never done  . INFLUENZA VACCINE  11/02/2019    Colorectal cancer screening: No longer required.  Mammogram status: No longer required.     Lung Cancer Screening: (Low Dose CT Chest recommended if Age 52-80 years, 30 pack-year currently smoking OR have quit w/in 15years.) does not qualify.   Lung Cancer Screening Referral:   Additional Screening:  Hepatitis C Screening: does qualify; Completed 11/09/2017  Vision Screening: Recommended annual ophthalmology exams for early detection of glaucoma and other disorders of the eye. Is the patient up to date with their annual eye exam?  Yes  Who is the provider or what is the name of the office in which the patient attends annual [facility] exams[facility]  If pt is not established with a provider, would they like to be referred to a provider to establish care? No .   Dental Screening: Recommended annual dental exams for proper oral hygiene  Community Resource Referral / Chronic Care Management: CRR required this visit?  No   CCM required this visit?  No      Plan:  -Continue current medication regimen. -Continue to follow-up with various specialists. -Recommend ambulatory BP and pulse monitoring, BP elevated today. -No previous cognitive function screening on file to compare with, patient denies worsening memory, will continue to monitor. -Follow-up in 3 months for regular office visit: HTN, HLD, mood and FBW  I have personally reviewed and noted the following in the patient's chart:   . Medical and social  history . Use of alcohol, tobacco or illicit drugs  . Current medications and supplements . Functional ability and status . Nutritional status . Physical activity . Advanced directives . List of other physicians . Hospitalizations, surgeries, and ER visits in previous 12 months . Vitals . Screenings to include cognitive, depression, and falls . Referrals and appointments  In addition, I have reviewed and discussed with patient certain preventive protocols, quality metrics, and best practice recommendations. A written personalized care plan for preventive services as well as general preventive health recommendations were provided to patient.

## 2019-12-03 NOTE — Patient Instructions (Signed)
Preventive Care 51 Years and Older, Female Preventive care refers to lifestyle choices and visits with your health care provider that can promote health and wellness. This includes:  A yearly physical exam. This is also called an annual well check.  Regular dental and eye exams.  Immunizations.  Screening for certain conditions.  Healthy lifestyle choices, such as diet and exercise. What can I expect for my preventive care visit? Physical exam Your health care provider will check:  Height and weight. These may be used to calculate body mass index (BMI), which is a measurement that tells if you are at a healthy weight.  Heart rate and blood pressure.  Your skin for abnormal spots. Counseling Your health care provider may ask you questions about:  Alcohol, tobacco, and drug use.  Emotional well-being.  Home and relationship well-being.  Sexual activity.  Eating habits.  History of falls.  Memory and ability to understand (cognition).  Work and work Statistician.  Pregnancy and menstrual history. What immunizations do I need?  Influenza (flu) vaccine  This is recommended every year. Tetanus, diphtheria, and pertussis (Tdap) vaccine  You may need a Td booster every 10 years. Varicella (chickenpox) vaccine  You may need this vaccine if you have not already been vaccinated. Zoster (shingles) vaccine  You may need this after age 39. Pneumococcal conjugate (PCV13) vaccine  One dose is recommended after age 73. Pneumococcal polysaccharide (PPSV23) vaccine  One dose is recommended after age 13. Measles, mumps, and rubella (MMR) vaccine  You may need at least one dose of MMR if you were born in 1957 or later. You may also need a second dose. Meningococcal conjugate (MenACWY) vaccine  You may need this if you have certain conditions. Hepatitis A vaccine  You may need this if you have certain conditions or if you travel or work in places where you may be exposed  to hepatitis A. Hepatitis B vaccine  You may need this if you have certain conditions or if you travel or work in places where you may be exposed to hepatitis B. Haemophilus influenzae type b (Hib) vaccine  You may need this if you have certain conditions. You may receive vaccines as individual doses or as more than one vaccine together in one shot (combination vaccines). Talk with your health care provider about the risks and benefits of combination vaccines. What tests do I need? Blood tests  Lipid and cholesterol levels. These may be checked every 5 years, or more frequently depending on your overall health.  Hepatitis C test.  Hepatitis B test. Screening  Lung cancer screening. You may have this screening every year starting at age 69 if you have a 30-pack-year history of smoking and currently smoke or have quit within the past 15 years.  Colorectal cancer screening. All adults should have this screening starting at age 71 and continuing until age 4. Your health care provider may recommend screening at age 64 if you are at increased risk. You will have tests every 1-10 years, depending on your results and the type of screening test.  Diabetes screening. This is done by checking your blood sugar (glucose) after you have not eaten for a while (fasting). You may have this done every 1-3 years.  Mammogram. This may be done every 1-2 years. Talk with your health care provider about how often you should have regular mammograms.  BRCA-related cancer screening. This may be done if you have a family history of breast, ovarian, tubal, or peritoneal cancers.  Other tests °· Sexually transmitted disease (STD) testing. °· Bone density scan. This is done to screen for osteoporosis. You may have this done starting at age 65. °Follow these instructions at home: °Eating and drinking °· Eat a diet that includes fresh fruits and vegetables, whole grains, lean protein, and low-fat dairy products. Limit  your intake of foods with high amounts of sugar, saturated fats, and salt. °· Take vitamin and mineral supplements as recommended by your health care provider. °· Do not drink alcohol if your health care provider tells you not to drink. °· If you drink alcohol: °? Limit how much you have to 0-1 drink a day. °? Be aware of how much alcohol is in your drink. In the [place] one drink equals one 12 oz bottle of beer (355 mL), one 5 oz glass of wine (148 mL), or one 1½ oz glass of hard liquor (44 mL). °Lifestyle °· Take daily care of your teeth and gums. °· Stay active. Exercise for at least 30 minutes on 5 or more days each week. °· Do not use any products that contain nicotine or tobacco, such as cigarettes, e-cigarettes, and chewing tobacco. If you need help quitting, ask your health care provider. °· If you are sexually active, practice safe sex. Use a condom or other form of protection in order to prevent STIs (sexually transmitted infections). °· Talk with your health care provider about taking a low-dose aspirin or statin. °What's next? °· Go to your health care provider once a year for a well check visit. °· Ask your health care provider how often you should have your eyes and teeth checked. °· Stay up to date on all vaccines. °This information is not intended to replace advice given to you by your health care provider. Make sure you discuss any questions you have with your health care provider. °Document Revised: 03/14/2018 Document Reviewed: 03/14/2018 °Elsevier Patient Education © 2020 [employer] Inc. ° °

## 2019-12-11 ENCOUNTER — Other Ambulatory Visit: Payer: Self-pay | Admitting: Physician Assistant

## 2019-12-18 DIAGNOSIS — M5412 Radiculopathy, cervical region: Secondary | ICD-10-CM | POA: Diagnosis not present

## 2019-12-22 ENCOUNTER — Encounter: Payer: Self-pay | Admitting: *Deleted

## 2019-12-22 ENCOUNTER — Ambulatory Visit: Payer: Medicare Other | Admitting: Diagnostic Neuroimaging

## 2019-12-22 ENCOUNTER — Other Ambulatory Visit: Payer: Self-pay

## 2019-12-22 VITALS — BP 164/79 | HR 63 | Ht 62.5 in | Wt 224.2 lb

## 2019-12-22 DIAGNOSIS — M48062 Spinal stenosis, lumbar region with neurogenic claudication: Secondary | ICD-10-CM

## 2019-12-22 NOTE — Progress Notes (Signed)
[facility]  PATIENT: Tracey Walton DOB: [birth year]  REFERRING CLINICIAN: Mayer Masker, PA-C HISTORY FROM: patient  REASON FOR VISIT: new consult    HISTORICAL  CHIEF COMPLAINT:  Chief Complaint  Patient presents with  . RLS,  pain LE    rm 6 New Pt "several years of numbness/ tingling from foot to hip, L worse than R; lower legs getting smaller"    HISTORY OF PRESENT ILLNESS:   79 year old female here for evaluation of lower extremity numbness and pain.  Symptoms started 5 to 10 years ago or longer with bilateral lower back, hip, leg numbness and pain rating down to her feet.  She had been diagnosed with spinal stenosis in 2012.  This is managed conservatively with epidural steroid injections and medication.  She has not had treatment in several years.  Symptoms have worsened in the last 1 to 2 years.  Pain worse when she stands up or walks.  She feels like her left leg muscles are shrinking.  She is having more problems with balance and walking.   REVIEW OF SYSTEMS: Full 14 system review of systems performed and negative with exception of: As per HPI.  ALLERGIES: Allergies  Allergen Reactions  . Penicillins Rash    Pt tolerated 2g ancef without issue 10/12/16    HOME MEDICATIONS: Outpatient Medications Prior to Visit  Medication Sig Dispense Refill  . acetaminophen (TYLENOL) 325 MG tablet Take 650 mg by mouth 4 (four) times daily as needed (for pain.).    Marland Kitchen amLODipine (NORVASC) 2.5 MG tablet Take 1 tablet (2.5 mg total) by mouth every evening. 90 tablet 3  . atorvastatin (LIPITOR) 40 MG tablet Take 1 tablet (40 mg total) by mouth daily at 6 PM. 90 tablet 3  . carvedilol (COREG) 6.25 MG tablet Take 1 tablet (6.25 mg total) by mouth 2 (two) times daily. 180 tablet 3  . cetirizine (ZYRTEC) 10 MG tablet Take 10 mg by mouth daily.    . Coenzyme Q10 ([employer] COENZYME Q-10) 100 MG capsule Take 200 mg by mouth daily.    . ferrous sulfate 325 (65 FE) MG  tablet Take 1 tablet (325 mg total) by mouth daily with breakfast. Take on Monday, Wednesday, Fridays 90 tablet 1  . FLUoxetine (PROZAC) 20 MG capsule TAKE 1 CAPSULE BY MOUTH  DAILY 30 capsule 2  . fluticasone (FLONASE) 50 MCG/ACT nasal spray USE 2 SPRAYS INTO BOTH  NOSTRILS DAILY AS NEEDED  FOR ALLERGIES 48 g 0  . hydrochlorothiazide (HYDRODIURIL) 25 MG tablet Take 1 tablet (25 mg total) by mouth daily. 90 tablet 3  . HYDROcodone-acetaminophen (NORCO/VICODIN) 5-325 MG tablet Take 1 tablet by mouth every 6 (six) hours as needed for moderate pain or severe pain.     . methocarbamol (ROBAXIN) 500 MG tablet methocarbamol 500 mg tablet  Take 1 tablet twice a day by oral route as needed.    . naproxen (NAPROSYN) 500 MG tablet TAKE 1 TABLET BY MOUTH TWO  TIMES DAILY WITH A MEAL 180 tablet 1  . omeprazole (PRILOSEC) 20 MG capsule TAKE 1 CAPSULE BY MOUTH  DAILY 90 capsule 1  . Polyethyl Glycol-Propyl Glycol (LUBRICANT EYE DROPS) 0.4-0.3 % SOLN Place 1 drop into both eyes 3 (three) times daily as needed (for dry/irritated eyes.).    Marland Kitchen triamcinolone cream (KENALOG) 0.1 % Apply 1 application topically 2 (two) times daily. (Patient not taking: Reported on 12/03/2019) 30 g 0   No facility-administered medications prior to visit.  PAST MEDICAL HISTORY: Past Medical History:  Diagnosis Date  . Anxiety   . Arthritis    "qwhere" (08/09/2017)  . Chest pain 2011   negative for MI  . Chronic kidney disease    renal insufficiency. 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 Chronic lower back pain   . Depression   . Dyspnea    SOB while walking  . GERD (gastroesophageal reflux disease)   . Heart murmur   . History of hiatal hernia   . Hyperlipidemia   . Hypertension   . Osteoporosis   . Pneumonia ~ 2014  . Polymyalgia (HCC)   . Strep throat     PAST SURGICAL HISTORY: Past Surgical History:  Procedure Laterality Date  . ABDOMINAL HYSTERECTOMY     partial  . BILATERAL  CARPAL TUNNEL RELEASE  2003-2005   right-left  . CATARACT EXTRACTION W/ INTRAOCULAR LENS  IMPLANT, BILATERAL Bilateral   . JOINT REPLACEMENT    . REVERSE SHOULDER ARTHROPLASTY Left 10/12/2016   Procedure: REVERSE LEFT SHOULDER ARTHROPLASTY;  Surgeon: Francena Hanly, MD;  Location: [facility] OR;  Service: Orthopedics;  Laterality: Left;  . REVERSE SHOULDER ARTHROPLASTY Right 08/09/2017   Procedure: RIGHT REVERSE SHOULDER ARTHROPLASTY;  Surgeon: Francena Hanly, MD;  Location: [facility] OR;  Service: Orthopedics;  Laterality: Right;  . SHOULDER ARTHROSCOPY Right 2003  . TOOTH EXTRACTION     "1 tooth" (08/09/2017)  . TOTAL KNEE ARTHROPLASTY Bilateral     FAMILY HISTORY: Family History  Problem Relation Age of Onset  . Heart attack Mother   . Heart disease Mother   . Hypertension Father   . Heart failure Father   . Kidney failure Father   . Hyperlipidemia Father   . Stroke Father   . Hypertension Sister   . Hyperlipidemia Sister     SOCIAL HISTORY: Social History   Socioeconomic History  . Marital status: Widowed    Spouse name: Not on file  . Number of children: 3  . Years of education: Not on file  . Highest education level: 10th grade  Occupational History  . Not on file  Tobacco Use  . Smoking status: Never Smoker  . Smokeless tobacco: Never Used  Vaping Use  . Vaping Use: Never used  Substance and Sexual Activity  . Alcohol use: Never  . Drug use: Never  . Sexual activity: Not Currently    Birth control/protection: None  Other Topics Concern  . Not on file  Social History Narrative   Lives with daughter   caffeine not much at all   Social Determinants of Health   Financial Resource Strain:   . Difficulty of Paying Living Expenses: Not on file  Food Insecurity:   . Worried About Programme researcher, broadcasting/film/video in the Last Year: Not on file  . Ran Out of Food in the Last Year: Not on file  Transportation Needs:   . Lack of Transportation (Medical): Not on file  . Lack of Transportation  (Non-Medical): Not on file  Physical Activity:   . Days of Exercise per Week: Not on file  . Minutes of Exercise per Session: Not on file  Stress:   . Feeling of Stress : Not on file  Social Connections:   . Frequency of Communication with Friends and Family: Not on file  . Frequency of Social Gatherings with Friends and Family: Not on file  . Attends Religious Services: Not on file  . Active Member of  Clubs or Organizations: Not on file  . Attends BankerClub or Organization Meetings: Not on file  . Marital Status: Not on file  Intimate Partner Violence:   . Fear of Current or Ex-Partner: Not on file  . Emotionally Abused: Not on file  . Physically Abused: Not on file  . Sexually Abused: Not on file     PHYSICAL EXAM  GENERAL EXAM/CONSTITUTIONAL: Vitals:  Vitals:   12/22/19 1138  BP: (!) 164/79  Pulse: 63  Weight: 224 lb 3.2 oz (101.7 kg)  Height: 5' 2.5" (1.588 m)     Body mass index is 40.35 kg/m. Wt Readings from Last 3 Encounters:  12/22/19 224 lb 3.2 oz (101.7 kg)  12/03/19 225 lb (102.1 kg)  11/26/19 223 lb (101.2 kg)     Patient is in no distress; well developed, nourished and groomed; neck is supple  CARDIOVASCULAR:  Examination of carotid arteries is normal; no carotid bruits  Regular rate and rhythm, no murmurs  Examination of peripheral vascular system by observation and palpation is normal  EYES:  Ophthalmoscopic exam of optic discs and posterior segments is normal; no papilledema or hemorrhages  No exam data present  MUSCULOSKELETAL:  Gait, strength, tone, movements noted in Neurologic exam below  NEUROLOGIC: MENTAL STATUS:  No flowsheet data found.  awake, alert, oriented to person, place and time  recent and remote memory intact  normal attention and concentration  language fluent, comprehension intact, naming intact  fund of knowledge appropriate  CRANIAL NERVE:   2nd - no papilledema on fundoscopic exam  2nd, 3rd, 4th, 6th -  pupils equal and reactive to light, visual fields full to confrontation, extraocular muscles intact, no nystagmus  5th - facial sensation symmetric  7th - facial strength symmetric  8th - hearing intact  9th - palate elevates symmetrically, uvula midline  11th - shoulder shrug symmetric  12th - tongue protrusion midline  MOTOR:   normal bulk and tone, full strength in the BUE, BLE;  EXCEPT DECR IN BLE HIP FLEXION 4+  SENSORY:   normal and symmetric to light touch, temperature, vibration  COORDINATION:   finger-nose-finger, fine finger movements normal  REFLEXES:   deep tendon reflexes TRACE and symmetric  GAIT/STATION:   narrow based gait; ANTALGIC GAIT     DIAGNOSTIC DATA (LABS, IMAGING, TESTING) - I reviewed patient records, labs, notes, testing and imaging myself where available.  Lab Results  Component Value Date   WBC 5.9 05/12/2019   HGB 13.2 05/12/2019   HCT 38.4 05/12/2019   MCV 93 05/12/2019   PLT 341 05/12/2019      Component Value Date/Time   NA 136 01/02/2019 0933   K 5.0 01/02/2019 0933   CL 99 01/02/2019 0933   CO2 25 01/02/2019 0933   GLUCOSE 99 01/02/2019 0933   GLUCOSE 88 07/27/2017 1517   BUN 22 01/02/2019 0933   CREATININE 1.02 (H) 01/02/2019 0933   CREATININE 1.10 (H) 10/01/2015 0746   CALCIUM 9.1 01/02/2019 0933   PROT 7.0 01/02/2019 0933   ALBUMIN 4.1 01/02/2019 0933   AST 14 01/02/2019 0933   ALT 13 01/02/2019 0933   ALKPHOS 100 01/02/2019 0933   BILITOT 0.6 01/02/2019 0933   GFRNONAA 53 (L) 01/02/2019 0933   GFRAA 61 01/02/2019 0933   Lab Results  Component Value Date   CHOL 181 01/02/2019   HDL 41 01/02/2019   LDLCALC 123 (H) 01/02/2019   TRIG 95 01/02/2019   CHOLHDL 4.4 01/02/2019   Lab Results  Component Value Date   HGBA1C 5.2 05/12/2019   Lab Results  Component Value Date   VITAMINB12 393 01/02/2019   Lab Results  Component Value Date   TSH 1.090 05/12/2019    08/30/10 MRI lumbar spine [I reviewed  images myself and agree with interpretation. -VRP]  - moderate spinal stenosis at L3-4 - L4-5 right foraminal stenosis - L5-S1 bilateral biforaminal stenosis   05/28/19 TTE 1. Large hiatal hernia seen in parasternal views. Recommend clinical  correlation.  2. Left ventricular ejection fraction, by estimation, is 55 to 60%. The  left ventricle has normal function. The left ventricle has no regional  wall motion abnormalities. There is moderate concentric left ventricular  hypertrophy. Left ventricular  diastolic parameters are consistent with Grade I diastolic dysfunction  (impaired relaxation).  3. Right ventricular systolic function is normal. The right ventricular  size is normal. There is mildly elevated pulmonary artery systolic  pressure.  4. Left atrial size was moderately dilated.  5. The mitral valve is normal in structure and function. Mild to moderate  mitral valve regurgitation.  6. Fixed noncoronary cusp. The aortic valve is tricuspid. Aortic valve  regurgitation is mild. Mild aortic valve stenosis.  7. The inferior vena cava is normal in size with <50% respiratory  variability, suggesting right atrial pressure of 8 mmHg.     ASSESSMENT AND PLAN  79 y.o. year old female here with:  Dx:  1. Spinal stenosis of lumbar region with neurogenic claudication     PLAN:  BILATERAL LUMBAR RADICULOPATHIES / SPINAL STENOSIS WITH NEUROGENIC CLAUDICATION - check MRI lumbar spine; then consider lumbar spine surgery evaluation and follow up with Dr. Ethelene Hal  Return for pending if symptoms worsen or fail to improve, return to PCP.    Suanne Marker, MD 12/22/2019, 12:09 PM Certified in Neurology, Neurophysiology and Neuroimaging  [facility] [address], [address] [place], [place] [address] [phone]

## 2019-12-23 ENCOUNTER — Encounter: Payer: Self-pay | Admitting: *Deleted

## 2019-12-23 ENCOUNTER — Telehealth: Payer: Self-pay | Admitting: Diagnostic Neuroimaging

## 2019-12-23 DIAGNOSIS — M48062 Spinal stenosis, lumbar region with neurogenic claudication: Secondary | ICD-10-CM

## 2019-12-23 NOTE — Addendum Note (Signed)
Addended by: [place] Pink on: 12/23/2019 11:27 AM   Modules accepted: Orders

## 2019-12-23 NOTE — Telephone Encounter (Signed)
Ok for consult to Dr. Venetia Maxon. She was going to think about MRI order after talking to her orthopedic / spine doctor. -VRP

## 2019-12-23 NOTE — Telephone Encounter (Signed)
Patient' Daughter called Bjorn Loser she is on [facility] and stated she spoke with her mom and she would a referral to see Dr. Venetia Maxon .

## 2019-12-28 ENCOUNTER — Other Ambulatory Visit: Payer: Self-pay | Admitting: Cardiovascular Disease

## 2019-12-28 DIAGNOSIS — E782 Mixed hyperlipidemia: Secondary | ICD-10-CM

## 2019-12-28 DIAGNOSIS — I10 Essential (primary) hypertension: Secondary | ICD-10-CM

## 2019-12-29 ENCOUNTER — Telehealth: Payer: Self-pay | Admitting: Physician Assistant

## 2019-12-29 DIAGNOSIS — E785 Hyperlipidemia, unspecified: Secondary | ICD-10-CM

## 2019-12-29 DIAGNOSIS — I10 Essential (primary) hypertension: Secondary | ICD-10-CM

## 2019-12-29 MED ORDER — FLUTICASONE PROPIONATE 50 MCG/ACT NA SUSP: 2.0000 | Freq: Every day | NASAL | 2 refills | Status: DC

## 2019-12-29 MED ORDER — FLUOXETINE HCL 20 MG PO CAPS: 20.0000 mg | ORAL_CAPSULE | Freq: Every day | ORAL | 0 refills | Status: DC

## 2019-12-29 MED ORDER — OMEPRAZOLE 20 MG PO CPDR: 20.0000 mg | DELAYED_RELEASE_CAPSULE | Freq: Every day | ORAL | 1 refills | Status: DC

## 2019-12-29 MED ORDER — NAPROXEN 500 MG PO TABS
500.0000 mg | ORAL_TABLET | Freq: Two times a day (BID) | ORAL | 1 refills | Status: DC
Start: 2019-12-29 — End: 2020-05-13

## 2019-12-29 NOTE — Telephone Encounter (Signed)
Patient requesting 90 day supply on rx due to cost. AS, CMA

## 2019-12-29 NOTE — Addendum Note (Signed)
Addended by: Sylvester Harder on: 12/29/2019 01:35 PM   Modules accepted: Orders

## 2019-12-29 NOTE — Telephone Encounter (Signed)
Patient left VM during lunch requesting a call back from nurse to discuss her prescriptions and the ordering confusion that seems to be taking place between our office and her pharm. Please contact when available

## 2020-01-13 DIAGNOSIS — M5412 Radiculopathy, cervical region: Secondary | ICD-10-CM | POA: Diagnosis not present

## 2020-01-13 DIAGNOSIS — M5442 Lumbago with sciatica, left side: Secondary | ICD-10-CM | POA: Diagnosis not present

## 2020-01-13 DIAGNOSIS — M5441 Lumbago with sciatica, right side: Secondary | ICD-10-CM | POA: Diagnosis not present

## 2020-01-13 DIAGNOSIS — M4125 Other idiopathic scoliosis, thoracolumbar region: Secondary | ICD-10-CM | POA: Diagnosis not present

## 2020-01-13 DIAGNOSIS — M4316 Spondylolisthesis, lumbar region: Secondary | ICD-10-CM | POA: Diagnosis not present

## 2020-01-13 DIAGNOSIS — M47816 Spondylosis without myelopathy or radiculopathy, lumbar region: Secondary | ICD-10-CM | POA: Diagnosis not present

## 2020-01-13 DIAGNOSIS — M4120 Other idiopathic scoliosis, site unspecified: Secondary | ICD-10-CM | POA: Diagnosis not present

## 2020-01-19 ENCOUNTER — Other Ambulatory Visit: Payer: Self-pay | Admitting: Neurosurgery

## 2020-01-19 DIAGNOSIS — G8929 Other chronic pain: Secondary | ICD-10-CM

## 2020-01-19 DIAGNOSIS — M5412 Radiculopathy, cervical region: Secondary | ICD-10-CM

## 2020-02-12 ENCOUNTER — Ambulatory Visit
Admission: RE | Admit: 2020-02-12 | Discharge: 2020-02-12 | Disposition: A | Discharge status: Home / Self Care | Payer: Medicare Other | Source: Ambulatory Visit | Attending: Neurosurgery | Admitting: Neurosurgery

## 2020-02-12 ENCOUNTER — Other Ambulatory Visit: Payer: Self-pay

## 2020-02-12 DIAGNOSIS — M5412 Radiculopathy, cervical region: Secondary | ICD-10-CM

## 2020-02-12 DIAGNOSIS — M4319 Spondylolisthesis, multiple sites in spine: Secondary | ICD-10-CM | POA: Diagnosis not present

## 2020-02-12 DIAGNOSIS — M5442 Lumbago with sciatica, left side: Secondary | ICD-10-CM

## 2020-02-12 DIAGNOSIS — G8929 Other chronic pain: Secondary | ICD-10-CM

## 2020-02-12 DIAGNOSIS — M5021 Other cervical disc displacement,  high cervical region: Secondary | ICD-10-CM | POA: Diagnosis not present

## 2020-02-12 DIAGNOSIS — M4802 Spinal stenosis, cervical region: Secondary | ICD-10-CM | POA: Diagnosis not present

## 2020-02-12 DIAGNOSIS — M545 Low back pain, unspecified: Secondary | ICD-10-CM | POA: Diagnosis not present

## 2020-02-12 DIAGNOSIS — G9589 Other specified diseases of spinal cord: Secondary | ICD-10-CM | POA: Diagnosis not present

## 2020-02-17 ENCOUNTER — Other Ambulatory Visit: Payer: Self-pay | Admitting: Cardiovascular Disease

## 2020-02-17 DIAGNOSIS — I10 Essential (primary) hypertension: Secondary | ICD-10-CM

## 2020-02-22 DIAGNOSIS — H40033 Anatomical narrow angle, bilateral: Secondary | ICD-10-CM | POA: Diagnosis not present

## 2020-02-22 DIAGNOSIS — H43393 Other vitreous opacities, bilateral: Secondary | ICD-10-CM | POA: Diagnosis not present

## 2020-02-25 ENCOUNTER — Other Ambulatory Visit: Payer: Self-pay | Admitting: Physician Assistant

## 2020-02-25 DIAGNOSIS — R739 Hyperglycemia, unspecified: Secondary | ICD-10-CM

## 2020-02-25 DIAGNOSIS — I10 Essential (primary) hypertension: Secondary | ICD-10-CM

## 2020-02-25 DIAGNOSIS — E782 Mixed hyperlipidemia: Secondary | ICD-10-CM

## 2020-02-25 DIAGNOSIS — Z0000 Encounter for general adult medical examination without abnormal findings: Secondary | ICD-10-CM

## 2020-03-01 ENCOUNTER — Other Ambulatory Visit: Payer: Medicare Other

## 2020-03-01 ENCOUNTER — Other Ambulatory Visit: Payer: Self-pay

## 2020-03-01 DIAGNOSIS — M4120 Other idiopathic scoliosis, site unspecified: Secondary | ICD-10-CM | POA: Diagnosis not present

## 2020-03-01 DIAGNOSIS — M5442 Lumbago with sciatica, left side: Secondary | ICD-10-CM | POA: Diagnosis not present

## 2020-03-01 DIAGNOSIS — I10 Essential (primary) hypertension: Secondary | ICD-10-CM

## 2020-03-01 DIAGNOSIS — Z0000 Encounter for general adult medical examination without abnormal findings: Secondary | ICD-10-CM | POA: Diagnosis not present

## 2020-03-01 DIAGNOSIS — E782 Mixed hyperlipidemia: Secondary | ICD-10-CM

## 2020-03-01 DIAGNOSIS — R739 Hyperglycemia, unspecified: Secondary | ICD-10-CM

## 2020-03-01 DIAGNOSIS — M47816 Spondylosis without myelopathy or radiculopathy, lumbar region: Secondary | ICD-10-CM | POA: Diagnosis not present

## 2020-03-01 DIAGNOSIS — M5441 Lumbago with sciatica, right side: Secondary | ICD-10-CM | POA: Diagnosis not present

## 2020-03-01 DIAGNOSIS — M4316 Spondylolisthesis, lumbar region: Secondary | ICD-10-CM | POA: Diagnosis not present

## 2020-03-02 LAB — COMPREHENSIVE METABOLIC PANEL
ALT: 15 IU/L (ref 0–32)
AST: 20 IU/L (ref 0–40)
Albumin/Globulin Ratio: 1.6 (ref 1.2–2.2)
Albumin: 4.2 g/dL (ref 3.7–4.7)
Alkaline Phosphatase: 92 IU/L (ref 44–121)
BUN/Creatinine Ratio: 30 — ABNORMAL HIGH (ref 12–28)
BUN: 33 mg/dL — ABNORMAL HIGH (ref 8–27)
Bilirubin Total: 0.6 mg/dL (ref 0.0–1.2)
CO2: 24 mmol/L (ref 20–29)
Calcium: 9.6 mg/dL (ref 8.7–10.3)
Chloride: 102 mmol/L (ref 96–106)
Creatinine, Ser: 1.09 mg/dL — ABNORMAL HIGH (ref 0.57–1.00)
GFR calc Af Amer: 56 mL/min/{1.73_m2} — ABNORMAL LOW (ref >59)
GFR calc non Af Amer: 49 mL/min/{1.73_m2} — ABNORMAL LOW (ref >59)
Globulin, Total: 2.7 g/dL (ref 1.5–4.5)
Glucose: 79 mg/dL (ref 65–99)
Potassium: 4.1 mmol/L (ref 3.5–5.2)
Sodium: 143 mmol/L (ref 134–144)
Total Protein: 6.9 g/dL (ref 6.0–8.5)

## 2020-03-02 LAB — CBC
Hematocrit: 37.9 % (ref 34.0–46.6)
Hemoglobin: 13 g/dL (ref 11.1–15.9)
MCH: 31.9 pg (ref 26.6–33.0)
MCHC: 34.3 g/dL (ref 31.5–35.7)
MCV: 93 fL (ref 79–97)
Platelets: 331 10*3/uL (ref 150–450)
RBC: 4.07 x10E6/uL (ref 3.77–5.28)
RDW: 13.7 % (ref 11.7–15.4)
WBC: 6 10*3/uL (ref 3.4–10.8)

## 2020-03-02 LAB — LIPID PANEL
Chol/HDL Ratio: 4.3 ratio (ref 0.0–4.4)
Cholesterol, Total: 202 mg/dL — ABNORMAL HIGH (ref 100–199)
HDL: 47 mg/dL (ref >39)
LDL Chol Calc (NIH): 142 mg/dL — ABNORMAL HIGH (ref 0–99)
Triglycerides: 74 mg/dL (ref 0–149)
VLDL Cholesterol Cal: 13 mg/dL (ref 5–40)

## 2020-03-02 LAB — TSH: TSH: 0.986 u[IU]/mL (ref 0.450–4.500)

## 2020-03-02 LAB — HEMOGLOBIN A1C
Est. average glucose Bld gHb Est-mCnc: 108 mg/dL
Hgb A1c MFr Bld: 5.4 % (ref 4.8–5.6)

## 2020-03-03 ENCOUNTER — Ambulatory Visit: Payer: Medicare Other | Admitting: Physician Assistant

## 2020-03-10 ENCOUNTER — Other Ambulatory Visit: Payer: Self-pay | Admitting: Physician Assistant

## 2020-03-24 ENCOUNTER — Other Ambulatory Visit: Payer: Self-pay

## 2020-03-24 ENCOUNTER — Ambulatory Visit (INDEPENDENT_AMBULATORY_CARE_PROVIDER_SITE_OTHER): Payer: Medicare Other | Admitting: Physician Assistant

## 2020-03-24 ENCOUNTER — Encounter: Payer: Self-pay | Admitting: Physician Assistant

## 2020-03-24 VITALS — BP 163/82 | HR 72 | Temp 98.2°F | Ht 62.5 in | Wt 231.5 lb

## 2020-03-24 DIAGNOSIS — R8290 Unspecified abnormal findings in urine: Secondary | ICD-10-CM

## 2020-03-24 DIAGNOSIS — R102 Pelvic and perineal pain: Secondary | ICD-10-CM | POA: Diagnosis not present

## 2020-03-24 DIAGNOSIS — I10 Essential (primary) hypertension: Secondary | ICD-10-CM

## 2020-03-24 LAB — POCT URINALYSIS DIPSTICK
Bilirubin, UA: NEGATIVE
Blood, UA: NEGATIVE
Glucose, UA: NEGATIVE
Ketones, UA: NEGATIVE
Nitrite, UA: POSITIVE
Protein, UA: NEGATIVE
Spec Grav, UA: 1.02 (ref 1.010–1.025)
Urobilinogen, UA: 0.2 E.U./dL
pH, UA: 5 (ref 5.0–8.0)

## 2020-03-24 MED ORDER — NITROFURANTOIN MONOHYD MACRO 100 MG PO CAPS: 100.0000 mg | ORAL_CAPSULE | Freq: Two times a day (BID) | ORAL | 0 refills | Status: AC

## 2020-03-24 NOTE — Patient Instructions (Signed)

## 2020-03-24 NOTE — Progress Notes (Signed)
Acute Office Visit  Subjective:    Patient ID: Tracey Walton, female    DOB: [birth year], 79 y.o.   MRN: [id number]  Chief Complaint  Patient presents with  . Otalgia  . Abdominal Pain    HPI Patient is in today for two concerns  1. Suprapubic pressure - for the last 5-7 days she has had pressure over her bladder and urinary leakage after voiding. She feels symptoms are consistent with UTI's she has had in the past. Denies dysuria, hematuria.  Reports remote hx of kidney infection. No hx of urosepsis. She denies fever, chills, nausea/vomiting, flank pain.  2. "Fluid on my ears" Reports waxing and waning inner ear pressure/fullness. She has a history of effusions. Denies persistent otalgia, hearing loss, otorrhea.   Past Medical History:  Diagnosis Date  . Anxiety   . Arthritis    "qwhere" (08/09/2017)  . Chest pain 2011   negative for MI  . Chronic kidney disease    renal insufficiency. 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 Chronic lower back pain   . Depression   . Dyspnea    SOB while walking  . GERD (gastroesophageal reflux disease)   . Heart murmur   . History of hiatal hernia   . Hyperlipidemia   . Hypertension   . Osteoporosis   . Pneumonia ~ 2014  . Polymyalgia (HCC)   . Strep throat     Past Surgical History:  Procedure Laterality Date  . ABDOMINAL HYSTERECTOMY     partial  . BILATERAL CARPAL TUNNEL RELEASE  2003-2005   right-left  . CATARACT EXTRACTION W/ INTRAOCULAR LENS  IMPLANT, BILATERAL Bilateral   . JOINT REPLACEMENT    . REVERSE SHOULDER ARTHROPLASTY Left 10/12/2016   Procedure: REVERSE LEFT SHOULDER ARTHROPLASTY;  Surgeon: Francena Hanly, MD;  Location: [facility] OR;  Service: Orthopedics;  Laterality: Left;  . REVERSE SHOULDER ARTHROPLASTY Right 08/09/2017   Procedure: RIGHT REVERSE SHOULDER ARTHROPLASTY;  Surgeon: Francena Hanly, MD;  Location: [facility] OR;  Service: Orthopedics;  Laterality: Right;  . SHOULDER ARTHROSCOPY  Right 2003  . TOOTH EXTRACTION     "1 tooth" (08/09/2017)  . TOTAL KNEE ARTHROPLASTY Bilateral     Family History  Problem Relation Age of Onset  . Heart attack Mother   . Heart disease Mother   . Hypertension Father   . Heart failure Father   . Kidney failure Father   . Hyperlipidemia Father   . Stroke Father   . Hypertension Sister   . Hyperlipidemia Sister     Social History   Socioeconomic History  . Marital status: Widowed    Spouse name: Not on file  . Number of children: 3  . Years of education: Not on file  . Highest education level: 10th grade  Occupational History  . Not on file  Tobacco Use  . Smoking status: Never Smoker  . Smokeless tobacco: Never Used  Vaping Use  . Vaping Use: Never used  Substance and Sexual Activity  . Alcohol use: Never  . Drug use: Never  . Sexual activity: Not Currently    Birth control/protection: None  Other Topics Concern  . Not on file  Social History Narrative   Lives with daughter   caffeine not much at all   Social Determinants of Health   Financial Resource Strain: Not on file  Food Insecurity: Not on file  Transportation Needs: Not on file  Physical Activity: Not on  file  Stress: Not on file  Social Connections: Not on file  Intimate Partner Violence: Not on file    Outpatient Medications Prior to Visit  Medication Sig Dispense Refill  . acetaminophen (TYLENOL) 325 MG tablet Take 650 mg by mouth 4 (four) times daily as needed (for pain.).    Marland Kitchen amLODipine (NORVASC) 2.5 MG tablet TAKE 1 TABLET BY MOUTH IN  THE EVENING 90 tablet 3  . atorvastatin (LIPITOR) 40 MG tablet TAKE 1 TABLET BY MOUTH  DAILY AT 6 PM 90 tablet 3  . carvedilol (COREG) 6.25 MG tablet TAKE 1 TABLET BY MOUTH  TWICE DAILY 180 tablet 1  . cetirizine (ZYRTEC) 10 MG tablet Take 10 mg by mouth daily.    . Coenzyme Q10 100 MG capsule Take 200 mg by mouth daily.    . ferrous sulfate 325 (65 FE) MG tablet Take 1 tablet (325 mg total) by mouth daily  with breakfast. Take on Monday, Wednesday, Fridays 90 tablet 1  . FLUoxetine (PROZAC) 20 MG capsule TAKE 1 CAPSULE BY MOUTH  DAILY 90 capsule 0  . fluticasone (FLONASE) 50 MCG/ACT nasal spray Place 2 sprays into both nostrils daily. 48 g 2  . hydrochlorothiazide (HYDRODIURIL) 25 MG tablet TAKE 1 TABLET BY MOUTH  DAILY 90 tablet 3  . HYDROcodone-acetaminophen (NORCO/VICODIN) 5-325 MG tablet Take 1 tablet by mouth every 6 (six) hours as needed for moderate pain or severe pain.     . methocarbamol (ROBAXIN) 500 MG tablet methocarbamol 500 mg tablet  Take 1 tablet twice a day by oral route as needed.    . naproxen (NAPROSYN) 500 MG tablet Take 1 tablet (500 mg total) by mouth 2 (two) times daily with a meal. 180 tablet 1  . omeprazole (PRILOSEC) 20 MG capsule Take 1 capsule (20 mg total) by mouth daily. 90 capsule 1  . Polyethyl Glycol-Propyl Glycol 0.4-0.3 % SOLN Place 1 drop into both eyes 3 (three) times daily as needed (for dry/irritated eyes.).    Marland Kitchen triamcinolone cream (KENALOG) 0.1 % Apply 1 application topically 2 (two) times daily. 30 g 0   No facility-administered medications prior to visit.    Allergies  Allergen Reactions  . Penicillins Rash    Pt tolerated 2g ancef without issue 10/12/16    Review of Systems  Constitutional: Positive for fatigue. Negative for chills and fever.  HENT: Positive for ear pain (fullness). Negative for tinnitus.   Respiratory: Positive for shortness of breath (chronic).   Genitourinary: Positive for pelvic pain (suprapubic). Negative for decreased urine volume, difficulty urinating, dysuria, flank pain and hematuria.  Musculoskeletal: Positive for back pain (chronic).  Neurological: Positive for dizziness (vertigo (chronic, episodic)).  All other systems reviewed and are negative.      Objective:    Physical Exam Constitutional:      General: She is not in acute distress.    Appearance: She is not ill-appearing.  HENT:     Right Ear: Tympanic  membrane normal. No decreased hearing (intact to finger rub) noted.     Left Ear: Tympanic membrane normal. No decreased hearing (intact to finger rub) noted.  Cardiovascular:     Heart sounds: Murmur (grade 3/6 ) heard.    Pulmonary:     Effort: Pulmonary effort is normal.     Breath sounds: Normal breath sounds.  Abdominal:     General: Abdomen is protuberant.     Palpations: Abdomen is soft.     Tenderness: There is no abdominal tenderness.  There is no right CVA tenderness, left CVA tenderness or guarding.  Neurological:     Mental Status: She is alert.     BP (!) 163/82   Pulse 72   Temp 98.2 F (36.8 C) (Oral)   Ht 5' 2.5" (1.588 m)   Wt 231 lb 8 oz (105 kg)   SpO2 96% Comment: on RA  BMI 41.67 kg/m  Wt Readings from Last 3 Encounters:  03/24/20 231 lb 8 oz (105 kg)  12/22/19 224 lb 3.2 oz (101.7 kg)  12/03/19 225 lb (102.1 kg)   BP Readings from Last 3 Encounters:  03/24/20 (!) 163/82  12/22/19 (!) 164/79  12/03/19 (!) 144/83    Health Maintenance Due  Topic Date Due  . COVID-19 Vaccine (1) Never done  . TETANUS/TDAP  Never done  . DEXA SCAN  Never done  . INFLUENZA VACCINE  11/02/2019    There are no preventive care reminders to display for this patient.   Lab Results  Component Value Date   TSH 0.986 03/01/2020   Lab Results  Component Value Date   WBC 6.0 03/01/2020   HGB 13.0 03/01/2020   HCT 37.9 03/01/2020   MCV 93 03/01/2020   PLT 331 03/01/2020   Lab Results  Component Value Date   NA 143 03/01/2020   K 4.1 03/01/2020   CO2 24 03/01/2020   GLUCOSE 79 03/01/2020   BUN 33 (H) 03/01/2020   CREATININE 1.09 (H) 03/01/2020   BILITOT 0.6 03/01/2020   ALKPHOS 92 03/01/2020   AST 20 03/01/2020   ALT 15 03/01/2020   PROT 6.9 03/01/2020   ALBUMIN 4.2 03/01/2020   CALCIUM 9.6 03/01/2020   ANIONGAP 12 07/27/2017   GFR 40.43 (L) 12/18/2014   Lab Results  Component Value Date   CHOL 202 (H) 03/01/2020   Lab Results  Component Value  Date   HDL 47 03/01/2020   Lab Results  Component Value Date   LDLCALC 142 (H) 03/01/2020   Lab Results  Component Value Date   TRIG 74 03/01/2020   Lab Results  Component Value Date   CHOLHDL 4.3 03/01/2020   Lab Results  Component Value Date   HGBA1C 5.4 03/01/2020       Assessment & Plan:   Problem List Items Addressed This Visit   None   Visit Diagnoses    Abnormal urinalysis    -  Primary   Relevant Orders   CULTURE, URINE COMPREHENSIVE   Pelvic pressure in female       Relevant Orders   POCT urinalysis dipstick (Completed)     Suprapubic pressure UA positive nitrates and small leuks Urine culture pending No signs/symptoms of pyelonephritis Treating empirically for uncomplicated cystitis with Macrobid - she has renal insufficiency that would make Bactrim high risk and documented PCN allergy Counseled on ER and return precautions  Otalgia No evidence of AOM  Elevated BP with white coat syndrome and HTN Reports home BP's are 120's/70's and she monitors regularly Compliant with BP meds Reviewed Cardiology note dated 11/26/19 documenting stageg B mild aortic stenosis   No orders of the defined types were placed in this encounter.    Carlis Stable, [place]

## 2020-03-30 ENCOUNTER — Telehealth: Payer: Self-pay | Admitting: Physician Assistant

## 2020-03-30 NOTE — Telephone Encounter (Signed)
Macrobid is not a medication I can send in a refill for. It is usually given to treat UTIs. I attempted to call patient from my blocked number but she would pick up and hang up. (probably thinks im a telemarketer) Can you please call patient and advise we are unable to refill this medication and inquire if patient is having UTI symptoms or what she needs this medication for? If she is sick or having UTI symptoms I would suggest she go to urgent care for evaluation and treatment since we do not have a provider in office until 04/07/19. Thank you

## 2020-03-31 LAB — CULTURE, URINE COMPREHENSIVE

## 2020-03-31 NOTE — Telephone Encounter (Signed)
Spoke with the patient and advised per notes from [place]. Patient understood. Thank you

## 2020-04-07 ENCOUNTER — Other Ambulatory Visit: Payer: Self-pay

## 2020-04-07 ENCOUNTER — Encounter: Payer: Self-pay | Admitting: Physician Assistant

## 2020-04-07 ENCOUNTER — Ambulatory Visit (INDEPENDENT_AMBULATORY_CARE_PROVIDER_SITE_OTHER): Payer: Medicare Other | Admitting: Physician Assistant

## 2020-04-07 VITALS — BP 138/88 | HR 86 | Ht 62.5 in | Wt 235.4 lb

## 2020-04-07 DIAGNOSIS — I10 Essential (primary) hypertension: Secondary | ICD-10-CM | POA: Diagnosis not present

## 2020-04-07 DIAGNOSIS — R3915 Urgency of urination: Secondary | ICD-10-CM

## 2020-04-07 DIAGNOSIS — N1831 Chronic kidney disease, stage 3a: Secondary | ICD-10-CM | POA: Diagnosis not present

## 2020-04-07 DIAGNOSIS — E782 Mixed hyperlipidemia: Secondary | ICD-10-CM

## 2020-04-07 LAB — POCT URINALYSIS DIPSTICK
Bilirubin, UA: NEGATIVE
Blood, UA: NEGATIVE
Glucose, UA: NEGATIVE
Ketones, UA: NEGATIVE
Nitrite, UA: NEGATIVE
Protein, UA: NEGATIVE
Spec Grav, UA: 1.025 (ref 1.010–1.025)
Urobilinogen, UA: 0.2 E.U./dL
pH, UA: 5.5 (ref 5.0–8.0)

## 2020-04-07 NOTE — Progress Notes (Signed)
Established Patient Office Visit  Subjective:  Patient ID: Tracey Walton, female    DOB: 1940-07-03  Age: 80 y.o. MRN: [id number]  CC:  Chief Complaint  Patient presents with  . Hypertension  . Hyperlipidemia    HPI Tracey Walton presents for follow-up on hypertension and hyperlipidemia.  Today patient has complaints of urinary urgency and frequency.  She denies fever, flank pain, nausea or vomiting.  Was treated for a UTI approximately 2 weeks ago with Macrobid.  Reports towards completing the antibiotic started having urinary symptoms again.  Has been staying hydrated and taking over-the-counter symptom relief medication.  HTN: Pt denies chest pain, palpitations, dizziness or increased lower extremity swelling from baseline. Taking medication as directed without side effects. Checks BP at home  and readings range 120s/50 to 60s. Pt follows a low salt diet.  HLD: Pt taking medication as directed without issues. Denies side effects.  Tries to stay as active as possible which is limited to chronic back pain related to spine issues.    Past Medical History:  Diagnosis Date  . Anxiety   . Arthritis    "qwhere" (08/09/2017)  . Chest pain 2011   negative for MI  . Chronic kidney disease    renal insufficiency. 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 Chronic lower back pain   . Depression   . Dyspnea    SOB while walking  . GERD (gastroesophageal reflux disease)   . Heart murmur   . History of hiatal hernia   . Hyperlipidemia   . Hypertension   . Osteoporosis   . Pneumonia ~ 2014  . Polymyalgia (HCC)   . Strep throat     Past Surgical History:  Procedure Laterality Date  . ABDOMINAL HYSTERECTOMY     partial  . BILATERAL CARPAL TUNNEL RELEASE  2003-2005   right-left  . CATARACT EXTRACTION W/ INTRAOCULAR LENS  IMPLANT, BILATERAL Bilateral   . JOINT REPLACEMENT    . REVERSE SHOULDER ARTHROPLASTY Left 10/12/2016   Procedure: REVERSE LEFT  SHOULDER ARTHROPLASTY;  Surgeon: Francena Hanly, MD;  Location: [facility] OR;  Service: Orthopedics;  Laterality: Left;  . REVERSE SHOULDER ARTHROPLASTY Right 08/09/2017   Procedure: RIGHT REVERSE SHOULDER ARTHROPLASTY;  Surgeon: Francena Hanly, MD;  Location: [facility] OR;  Service: Orthopedics;  Laterality: Right;  . SHOULDER ARTHROSCOPY Right 2003  . TOOTH EXTRACTION     "1 tooth" (08/09/2017)  . TOTAL KNEE ARTHROPLASTY Bilateral     Family History  Problem Relation Age of Onset  . Heart attack Mother   . Heart disease Mother   . Hypertension Father   . Heart failure Father   . Kidney failure Father   . Hyperlipidemia Father   . Stroke Father   . Hypertension Sister   . Hyperlipidemia Sister     Social History   Socioeconomic History  . Marital status: Widowed    Spouse name: Not on file  . Number of children: 3  . Years of education: Not on file  . Highest education level: 10th grade  Occupational History  . Not on file  Tobacco Use  . Smoking status: Never Smoker  . Smokeless tobacco: Never Used  Vaping Use  . Vaping Use: Never used  Substance and Sexual Activity  . Alcohol use: Never  . Drug use: Never  . Sexual activity: Not Currently    Birth control/protection: None  Other Topics Concern  . Not on file  Social History Narrative   Lives with daughter   caffeine not much at all   Social Determinants of Health   Financial Resource Strain: Not on file  Food Insecurity: Not on file  Transportation Needs: Not on file  Physical Activity: Not on file  Stress: Not on file  Social Connections: Not on file  Intimate Partner Violence: Not on file    Outpatient Medications Prior to Visit  Medication Sig Dispense Refill  . acetaminophen (TYLENOL) 325 MG tablet Take 650 mg by mouth 4 (four) times daily as needed (for pain.).    Marland Kitchen amLODipine (NORVASC) 2.5 MG tablet TAKE 1 TABLET BY MOUTH IN  THE EVENING 90 tablet 3  . atorvastatin (LIPITOR) 40 MG tablet TAKE 1 TABLET BY MOUTH   DAILY AT 6 PM 90 tablet 3  . carvedilol (COREG) 6.25 MG tablet TAKE 1 TABLET BY MOUTH  TWICE DAILY 180 tablet 1  . cetirizine (ZYRTEC) 10 MG tablet Take 10 mg by mouth daily.    . Coenzyme Q10 100 MG capsule Take 200 mg by mouth daily.    . ferrous sulfate 325 (65 FE) MG tablet Take 1 tablet (325 mg total) by mouth daily with breakfast. Take on Monday, Wednesday, Fridays 90 tablet 1  . FLUoxetine (PROZAC) 20 MG capsule TAKE 1 CAPSULE BY MOUTH  DAILY 90 capsule 0  . fluticasone (FLONASE) 50 MCG/ACT nasal spray Place 2 sprays into both nostrils daily. 48 g 2  . hydrochlorothiazide (HYDRODIURIL) 25 MG tablet TAKE 1 TABLET BY MOUTH  DAILY 90 tablet 3  . HYDROcodone-acetaminophen (NORCO/VICODIN) 5-325 MG tablet Take 1 tablet by mouth every 6 (six) hours as needed for moderate pain or severe pain.     . methocarbamol (ROBAXIN) 500 MG tablet methocarbamol 500 mg tablet  Take 1 tablet twice a day by oral route as needed.    . naproxen (NAPROSYN) 500 MG tablet Take 1 tablet (500 mg total) by mouth 2 (two) times daily with a meal. 180 tablet 1  . omeprazole (PRILOSEC) 20 MG capsule Take 1 capsule (20 mg total) by mouth daily. 90 capsule 1  . Polyethyl Glycol-Propyl Glycol 0.4-0.3 % SOLN Place 1 drop into both eyes 3 (three) times daily as needed (for dry/irritated eyes.).     No facility-administered medications prior to visit.    Allergies  Allergen Reactions  . Penicillins Rash    Pt tolerated 2g ancef without issue 10/12/16    ROS Review of Systems  A fourteen system review of systems was performed and found to be positive as per HPI.   Objective:    Physical Exam General:  Well Developed, well nourished, in no acute distress Neuro:  Alert and oriented,  extra-ocular muscles intact  HEENT:  Normocephalic, atraumatic, neck supple Skin:  no gross rash, warm, pink. Cardiac:  RRR, S1 S2, +murmur Respiratory:  ECTA B/L, Not using accessory muscles, speaking in full sentences- unlabored. Abd:  No TTP, NBS, no CVA Vascular:  Ext warm, no cyanosis apprec. Psych:  No HI/SI, judgement and insight good, Euthymic mood. Full Affect.   BP 138/88   Pulse 86   Ht 5' 2.5" (1.588 m)   Wt 235 lb 6.4 oz (106.8 kg)   SpO2 96%   BMI 42.37 kg/m  Wt Readings from Last 3 Encounters:  04/07/20 235 lb 6.4 oz (106.8 kg)  03/24/20 231 lb 8 oz (105 kg)  12/22/19 224 lb 3.2 oz (101.7 kg)     Health Maintenance Due  Topic Date Due  . COVID-19 Vaccine (1) Never done  . TETANUS/TDAP  Never done  . DEXA SCAN  Never done  . INFLUENZA VACCINE  11/02/2019    There are no preventive care reminders to display for this patient.  Lab Results  Component Value Date   TSH 0.986 03/01/2020   Lab Results  Component Value Date   WBC 6.0 03/01/2020   HGB 13.0 03/01/2020   HCT 37.9 03/01/2020   MCV 93 03/01/2020   PLT 331 03/01/2020   Lab Results  Component Value Date   NA 143 03/01/2020   K 4.1 03/01/2020   CO2 24 03/01/2020   GLUCOSE 79 03/01/2020   BUN 33 (H) 03/01/2020   CREATININE 1.09 (H) 03/01/2020   BILITOT 0.6 03/01/2020   ALKPHOS 92 03/01/2020   AST 20 03/01/2020   ALT 15 03/01/2020   PROT 6.9 03/01/2020   ALBUMIN 4.2 03/01/2020   CALCIUM 9.6 03/01/2020   ANIONGAP 12 07/27/2017   GFR 40.43 (L) 12/18/2014   Lab Results  Component Value Date   CHOL 202 (H) 03/01/2020   Lab Results  Component Value Date   HDL 47 03/01/2020   Lab Results  Component Value Date   LDLCALC 142 (H) 03/01/2020   Lab Results  Component Value Date   TRIG 74 03/01/2020   Lab Results  Component Value Date   CHOLHDL 4.3 03/01/2020   Lab Results  Component Value Date   HGBA1C 5.4 03/01/2020      Assessment & Plan:   Problem List Items Addressed This Visit      Cardiovascular and Mediastinum   Elevated blood pressure reading in office with white coat syndrome, with diagnosis of hypertension     Other   Hyperlipidemia    Other Visit Diagnoses    Urinary urgency    -  Primary    Relevant Orders   POCT urinalysis dipstick (Completed)   Urine Culture (Completed)   Stage 3a chronic kidney disease (HCC)         Elevated blood pressure reading in office with whitecoat syndrome, with diagnosis of hypertension: -BP in office fairly at goal (has white coat syndrome), ambulatory BP readings well controlled. -Continue current medication regimen. -Continue ambulatory BP monitoring. -Continue low-sodium diet and good hydration. -Last CMP stable -Will continue to monitor alongside cardiology.  Hyperlipidemia: -Last lipid panel: Total cholesterol 2 2, triglycerides 74, HDL 47, LDL 142 -Continue current medication regimen.  Last hepatic function WNL. -Recommend to follow a heart healthy diet low and reduce saturated and transfats.  Continue to stay as active as possible. -Will continue to monitor.  Urinary urgency: -Urinalysis negative for blood, nitrites, ketones, bilirubin and small leukocytes.  Discussed with patient will send for urine culture and pending results will start appropriate antibiotic if indicated.  Recently treated with nitrofurantoin. -Recommend to continue good hydration.  Stage IIIa chronic kidney disease: -Last CMP: Creatinine 1.09, GFR 49 -Discussed with patient to avoid excessive NSAIDs, currently takes naproxen for arthralgias recommend to take as needed. -Will continue to monitor.  If renal function declines recommend to consider discontinuing diuretic HCTZ (managed by cardiology).   No orders of the defined types were placed in this encounter.   Follow-up: Return in about 3 months (around 07/06/2020) for HTN, HLD, CKD and BW (cmp).   Note:  This note was prepared with assistance of Dragon voice recognition software. Occasional wrong-word or sound-a-like substitutions may have occurred due to the inherent limitations of voice recognition  software.  Lorrene Reid, PA-C

## 2020-04-07 NOTE — Patient Instructions (Signed)

## 2020-04-09 LAB — URINE CULTURE

## 2020-04-12 ENCOUNTER — Ambulatory Visit (INDEPENDENT_AMBULATORY_CARE_PROVIDER_SITE_OTHER): Payer: Medicare Other | Admitting: Physician Assistant

## 2020-04-12 ENCOUNTER — Other Ambulatory Visit: Payer: Self-pay

## 2020-04-12 VITALS — BP 188/64 | HR 78 | Ht 62.5 in | Wt 230.5 lb

## 2020-04-12 DIAGNOSIS — R3915 Urgency of urination: Secondary | ICD-10-CM

## 2020-04-12 MED ORDER — CEFTRIAXONE SODIUM 500 MG IJ SOLR
1000.0000 mg | Freq: Once | INTRAMUSCULAR | Status: AC
  Administered 2020-04-12: 1000 mg via INTRAMUSCULAR

## 2020-04-12 NOTE — Progress Notes (Signed)
Patient tolerated 1G Rocephine well. AS, CMA  Urine culture positive for E. Coli with >100,000 colony forming units/mL and susceptible to ceftriaxone. Patient recently treated with nitrofurantoin.   Mayer Masker, PA-C

## 2020-05-12 ENCOUNTER — Other Ambulatory Visit: Payer: Self-pay | Admitting: Physician Assistant

## 2020-05-12 DIAGNOSIS — E785 Hyperlipidemia, unspecified: Secondary | ICD-10-CM

## 2020-05-12 DIAGNOSIS — I10 Essential (primary) hypertension: Secondary | ICD-10-CM

## 2020-05-17 ENCOUNTER — Telehealth: Payer: Self-pay | Admitting: Physician Assistant

## 2020-05-17 ENCOUNTER — Other Ambulatory Visit: Payer: Self-pay

## 2020-05-17 ENCOUNTER — Encounter: Payer: Self-pay | Admitting: Physician Assistant

## 2020-05-17 ENCOUNTER — Ambulatory Visit (INDEPENDENT_AMBULATORY_CARE_PROVIDER_SITE_OTHER): Payer: Medicare Other | Admitting: Physician Assistant

## 2020-05-17 VITALS — Ht 62.5 in | Wt 230.0 lb

## 2020-05-17 DIAGNOSIS — R300 Dysuria: Secondary | ICD-10-CM

## 2020-05-17 LAB — POCT URINALYSIS DIPSTICK
Bilirubin, UA: NEGATIVE
Glucose, UA: NEGATIVE
Ketones, UA: NEGATIVE
Nitrite, UA: NEGATIVE
Protein, UA: NEGATIVE
Spec Grav, UA: 1.02 (ref 1.010–1.025)
Urobilinogen, UA: 1 E.U./dL
pH, UA: 6 (ref 5.0–8.0)

## 2020-05-17 MED ORDER — NITROFURANTOIN MONOHYD MACRO 100 MG PO CAPS: 100.0000 mg | ORAL_CAPSULE | Freq: Two times a day (BID) | ORAL | 0 refills | Status: DC

## 2020-05-17 NOTE — Telephone Encounter (Signed)
Patient thinks she might have a UTI, please advise. Thanks

## 2020-05-17 NOTE — Telephone Encounter (Signed)
Please contact pt to schedule for in office visit with [facility] today at 330pm. AS, CMA

## 2020-05-17 NOTE — Progress Notes (Signed)
Telehealth office visit note for Tracey Masker, PA-C- at Primary Care at [facility]   I connected with current patient today by telephone and verified that I am speaking with the correct person   . Location of the patient: Home . Location of the provider: Office - This visit type was conducted due to national recommendations for restrictions regarding the COVID-19 Pandemic (e.g. social distancing) in an effort to limit this patient's exposure and mitigate transmission in our community.    - No physical exam could be performed with this format, beyond that communicated to [place] by the patient/ family members as noted.   - Additionally my office staff/ schedulers were to discuss with the patient that there may be a monetary charge related to this service, depending on their medical insurance.  My understanding is that patient understood and consented to proceed.     _________________________________________________________________________________   History of Present Illness: Patient calls in with complaints of dysuria, abdominal pressure, and urinary frequency x 1-2 weeks. Denies hematuria, cloudy urine, fever, nausea or vomiting. Does have chronic back pain which she is getting back injections for. Has been trying to stay hydrated, drinking cranberry juice and taking cranberry tablets with mild relief.     No flowsheet data found.  Depression screen [facility] 2/9 04/07/2020 03/24/2020 12/03/2019 12/30/2018 03/14/2018  Decreased Interest 0 1 3 0 1  Down, Depressed, Hopeless 0 0 1 0 1  PHQ - 2 Score 0 1 4 0 2  Altered sleeping 0 3 3 3 1   Tired, decreased energy 0 3 0 1 1  Change in appetite 0 2 3 0 3  Feeling bad or failure about yourself  0 0 0 0 1  Trouble concentrating 0 0 0 0 1  Moving slowly or fidgety/restless 0 0 0 0 1  Suicidal thoughts 0 0 0 0 1  PHQ-9 Score 0 9 10 4 11   Difficult doing work/chores - Not difficult at all Not difficult at all Not difficult at all Somewhat  difficult      Impression and Recommendations:     1. Dysuria     Dysuria:  -UA collected, positive for small leukocytes, trace of blood, negative for nitrites, protein, ketones and bilirubin. -Symptoms have been ongoing >1 week and will start empiric antibiotic with nitrofurantion. Last renal function stable, Cr 1.09 GFR 49. -Pending urine culture will change treatment plan if indicated. -Continue to stay well hydrated and drink cranberry juice. Discussed proper hygiene.   - As part of my medical decision making, I reviewed the following data within the electronic MEDICAL RECORD NUMBER History obtained from pt /family, CMA notes reviewed and incorporated if applicable, Labs reviewed, Radiograph/ tests reviewed if applicable and OV notes from prior OV's with me, as well as any other specialists she/he has seen since seeing me last, were all reviewed and used in my medical decision making process today.    - Additionally, when appropriate, discussion had with patient regarding our treatment plan, and their biases/concerns about that plan were used in my medical decision making today.    - The patient agreed with the plan and demonstrated an understanding of the instructions.   No barriers to understanding were identified.     - The patient was advised to call back or seek an in-person evaluation if the symptoms worsen or if the condition fails to improve as anticipated.   Return if symptoms worsen or fail to improve.  Orders Placed This Encounter  Procedures  . Urine Culture  . POCT urinalysis dipstick    Meds ordered this encounter  Medications  . nitrofurantoin, macrocrystal-monohydrate, (MACROBID) 100 MG capsule    Sig: Take 1 capsule (100 mg total) by mouth 2 (two) times daily.    Dispense:  10 capsule    Refill:  0    Order Specific Question:   Supervising Provider    Answer:   Nani Gasser D [[id number]]    There are no discontinued medications.     Time spent on  visit telephone encounter was 6 minutes.      The 21st Century Cures Act was signed into law in 2016 which includes the topic of electronic health records.  This provides immediate access to information in MyChart.  This includes consultation notes, operative notes, office notes, lab results and pathology reports.  If you have any questions about what you read please let us know at your next visit or call us at the office.  We are right here with you.   __________________________________________________________________________________     Patient Care Team    Relationship Specialty Notifications Start End  Tracey Walton, [place] PCP - General Physician Assistant  09/18/19   Nahser, Deloris Ping, MD PCP - Cardiology Cardiology Admissions 12/11/17      -Vitals obtained; medications/ allergies reconciled;  personal medical, social, Sx etc.histories were updated by CMA, reviewed by me and are reflected in chart   Patient Active Problem List   Diagnosis Date Noted  . Elevated blood pressure reading in office with white coat syndrome, with diagnosis of hypertension 03/24/2020  . Dizziness 05/12/2019  . Fluid level behind tympanic membrane of both ears 05/12/2019  . Porokeratosis 03/26/2019  . Leg pain 01/10/2018  . Depression 11/07/2017  . Healthcare maintenance 11/07/2017  . Dyspnea on exertion 11/07/2017  . Obesity with body mass index of 30.0-39.9 11/07/2017  . History of reverse total replacement of right shoulder joint 08/24/2017  . Long-term current use of opiate analgesic 07/25/2017  . Rotator cuff tear arthropathy 05/25/2017  . Pain in joint of right shoulder 05/25/2017  . 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 Mild aortic stenosis 07/06/2014  . Hypertension 05/29/2014  . Chest discomfort 05/29/2014  . Hyperlipidemia 05/29/2014  . Polymyalgia (HCC) 01/04/2013  . Chronic low back pain 09/30/2012  . History of total knee replacement, right 06/22/2009  . History  of total knee replacement, left 03/06/2002     Current Meds  Medication Sig  . acetaminophen (TYLENOL) 325 MG tablet Take 650 mg by mouth 4 (four) times daily as needed (for pain.).  Marland Kitchen amLODipine (NORVASC) 2.5 MG tablet TAKE 1 TABLET BY MOUTH IN  THE EVENING  . atorvastatin (LIPITOR) 40 MG tablet TAKE 1 TABLET BY MOUTH  DAILY AT 6 PM  . carvedilol (COREG) 6.25 MG tablet TAKE 1 TABLET BY MOUTH  TWICE DAILY  . cetirizine (ZYRTEC) 10 MG tablet Take 10 mg by mouth daily.  . Coenzyme Q10 100 MG capsule Take 200 mg by mouth daily.  . ferrous sulfate 325 (65 FE) MG tablet Take 1 tablet (325 mg total) by mouth daily with breakfast. Take on Monday, Wednesday, Fridays  . FLUoxetine (PROZAC) 20 MG capsule TAKE 1 CAPSULE BY MOUTH  DAILY  . fluticasone (FLONASE) 50 MCG/ACT nasal spray Place 2 sprays into both nostrils daily.  . hydrochlorothiazide (HYDRODIURIL) 25 MG tablet TAKE 1 TABLET BY MOUTH  DAILY  . HYDROcodone-acetaminophen (NORCO/VICODIN) 5-325 MG tablet  Take 1 tablet by mouth every 6 (six) hours as needed for moderate pain or severe pain.   . methocarbamol (ROBAXIN) 500 MG tablet methocarbamol 500 mg tablet  Take 1 tablet twice a day by oral route as needed.  . naproxen (NAPROSYN) 500 MG tablet TAKE 1 TABLET BY MOUTH  TWICE DAILY WITH A MEAL  . nitrofurantoin, macrocrystal-monohydrate, (MACROBID) 100 MG capsule Take 1 capsule (100 mg total) by mouth 2 (two) times daily.  Marland Kitchen omeprazole (PRILOSEC) 20 MG capsule TAKE 1 CAPSULE BY MOUTH  DAILY  . Polyethyl Glycol-Propyl Glycol 0.4-0.3 % SOLN Place 1 drop into both eyes 3 (three) times daily as needed (for dry/irritated eyes.).     Allergies:  Allergies  Allergen Reactions  . Penicillins Rash    Pt tolerated 2g ancef without issue 10/12/16     ROS:  See above HPI for pertinent positives and negatives   Objective:   Height 5' 2.5" (1.588 m), weight 230 lb (104.3 kg).  (if some vitals are omitted, this means that patient was UNABLE to  obtain them.) General: A & O * 3; sounds in no acute distress Respiratory: speaking in full sentences, no conversational dyspnea; patient confirms no use of accessory muscles Psych: insight appears good, mood- appears full

## 2020-05-17 NOTE — Patient Instructions (Signed)
Urinary Tract Infection, Adult A urinary tract infection (UTI) is an infection of any part of the urinary tract. The urinary tract includes:  The kidneys.  The ureters.  The bladder.  The urethra. These organs make, store, and get rid of pee (urine) in the body. What are the causes? This infection is caused by germs (bacteria) in your genital area. These germs grow and cause swelling (inflammation) of your urinary tract. What increases the risk? The following factors may make you more likely to develop this condition:  Using a small, thin tube (catheter) to drain pee.  Not being able to control when you pee or poop (incontinence).  Being female. If you are female, these things can increase the risk: ? Using these methods to prevent pregnancy:  A medicine that kills sperm (spermicide).  A device that blocks sperm (diaphragm). ? Having low levels of a female hormone (estrogen). ? Being pregnant. You are more likely to develop this condition if:  You have genes that add to your risk.  You are sexually active.  You take antibiotic medicines.  You have trouble peeing because of: ? A prostate that is bigger than normal, if you are female. ? A blockage in the part of your body that drains pee from the bladder. ? A kidney stone. ? A nerve condition that affects your bladder. ? Not getting enough to drink. ? Not peeing often enough.  You have other conditions, such as: ? Diabetes. ? A weak disease-fighting system (immune system). ? Sickle cell disease. ? Gout. ? Injury of the spine. What are the signs or symptoms? Symptoms of this condition include:  Needing to pee right away.  Peeing small amounts often.  Pain or burning when peeing.  Blood in the pee.  Pee that smells bad or not like normal.  Trouble peeing.  Pee that is cloudy.  Fluid coming from the vagina, if you are female.  Pain in the belly or lower back. Other symptoms include:  Vomiting.  Not  feeling hungry.  Feeling mixed up (confused). This may be the first symptom in older adults.  Being tired and grouchy (irritable).  A fever.  Watery poop (diarrhea). How is this treated?  Taking antibiotic medicine.  Taking other medicines.  Drinking enough water. In some cases, you may need to see a specialist. Follow these instructions at home: Medicines  Take over-the-counter and prescription medicines only as told by your doctor.  If you were prescribed an antibiotic medicine, take it as told by your doctor. Do not stop taking it even if you start to feel better. General instructions  Make sure you: ? Pee until your bladder is empty. ? Do not hold pee for a long time. ? Empty your bladder after sex. ? Wipe from front to back after peeing or pooping if you are a female. Use each tissue one time when you wipe.  Drink enough fluid to keep your pee pale yellow.  Keep all follow-up visits.   Contact a doctor if:  You do not get better after 1-2 days.  Your symptoms go away and then come back. Get help right away if:  You have very bad back pain.  You have very bad pain in your lower belly.  You have a fever.  You have chills.  You feeling like you will vomit or you vomit. Summary  A urinary tract infection (UTI) is an infection of any part of the urinary tract.  This condition is caused by   germs in your genital area.  There are many risk factors for a UTI.  Treatment includes antibiotic medicines.  Drink enough fluid to keep your pee pale yellow. This information is not intended to replace advice given to you by your health care provider. Make sure you discuss any questions you have with your health care provider. Document Revised: 10/31/2019 Document Reviewed: 10/31/2019 Elsevier Patient Education  2021 Elsevier Inc.  

## 2020-05-19 LAB — URINE CULTURE

## 2020-05-28 ENCOUNTER — Other Ambulatory Visit: Payer: Self-pay

## 2020-05-28 ENCOUNTER — Ambulatory Visit (INDEPENDENT_AMBULATORY_CARE_PROVIDER_SITE_OTHER): Payer: Medicare Other | Admitting: Nurse Practitioner

## 2020-05-28 ENCOUNTER — Telehealth: Payer: Self-pay | Admitting: Physician Assistant

## 2020-05-28 VITALS — BP 134/80 | HR 78 | Temp 97.6°F | Ht 65.0 in | Wt 228.9 lb

## 2020-05-28 DIAGNOSIS — R319 Hematuria, unspecified: Secondary | ICD-10-CM

## 2020-05-28 DIAGNOSIS — N390 Urinary tract infection, site not specified: Secondary | ICD-10-CM | POA: Diagnosis not present

## 2020-05-28 DIAGNOSIS — R300 Dysuria: Secondary | ICD-10-CM

## 2020-05-28 DIAGNOSIS — J0140 Acute pansinusitis, unspecified: Secondary | ICD-10-CM

## 2020-05-28 LAB — POCT URINALYSIS DIP (CLINITEK)
Bilirubin, UA: NEGATIVE
Glucose, UA: NEGATIVE mg/dL
Ketones, POC UA: NEGATIVE mg/dL
Nitrite, UA: NEGATIVE
Spec Grav, UA: 1.02 (ref 1.010–1.025)
Urobilinogen, UA: 1 E.U./dL
pH, UA: 5.5 (ref 5.0–8.0)

## 2020-05-28 MED ORDER — SULFAMETHOXAZOLE-TRIMETHOPRIM 800-160 MG PO TABS: 1.0000 | ORAL_TABLET | Freq: Two times a day (BID) | ORAL | 0 refills | Status: DC

## 2020-05-28 MED ORDER — FLUTICASONE PROPIONATE 50 MCG/ACT NA SUSP: 2.0000 | Freq: Every day | NASAL | 2 refills | Status: DC

## 2020-05-28 NOTE — Patient Instructions (Signed)
Urinary Tract Infection, Adult A urinary tract infection (UTI) is an infection of any part of the urinary tract. The urinary tract includes:  The kidneys.  The ureters.  The bladder.  The urethra. These organs make, store, and get rid of pee (urine) in the body. What are the causes? This infection is caused by germs (bacteria) in your genital area. These germs grow and cause swelling (inflammation) of your urinary tract. What increases the risk? The following factors may make you more likely to develop this condition:  Using a small, thin tube (catheter) to drain pee.  Not being able to control when you pee or poop (incontinence).  Being female. If you are female, these things can increase the risk: ? Using these methods to prevent pregnancy:  A medicine that kills sperm (spermicide).  A device that blocks sperm (diaphragm). ? Having low levels of a female hormone (estrogen). ? Being pregnant. You are more likely to develop this condition if:  You have genes that add to your risk.  You are sexually active.  You take antibiotic medicines.  You have trouble peeing because of: ? A prostate that is bigger than normal, if you are female. ? A blockage in the part of your body that drains pee from the bladder. ? A kidney stone. ? A nerve condition that affects your bladder. ? Not getting enough to drink. ? Not peeing often enough.  You have other conditions, such as: ? Diabetes. ? A weak disease-fighting system (immune system). ? Sickle cell disease. ? Gout. ? Injury of the spine. What are the signs or symptoms? Symptoms of this condition include:  Needing to pee right away.  Peeing small amounts often.  Pain or burning when peeing.  Blood in the pee.  Pee that smells bad or not like normal.  Trouble peeing.  Pee that is cloudy.  Fluid coming from the vagina, if you are female.  Pain in the belly or lower back. Other symptoms include:  Vomiting.  Not  feeling hungry.  Feeling mixed up (confused). This may be the first symptom in older adults.  Being tired and grouchy (irritable).  A fever.  Watery poop (diarrhea). How is this treated?  Taking antibiotic medicine.  Taking other medicines.  Drinking enough water. In some cases, you may need to see a specialist. Follow these instructions at home: Medicines  Take over-the-counter and prescription medicines only as told by your doctor.  If you were prescribed an antibiotic medicine, take it as told by your doctor. Do not stop taking it even if you start to feel better. General instructions  Make sure you: ? Pee until your bladder is empty. ? Do not hold pee for a long time. ? Empty your bladder after sex. ? Wipe from front to back after peeing or pooping if you are a female. Use each tissue one time when you wipe.  Drink enough fluid to keep your pee pale yellow.  Keep all follow-up visits.   Contact a doctor if:  You do not get better after 1-2 days.  Your symptoms go away and then come back. Get help right away if:  You have very bad back pain.  You have very bad pain in your lower belly.  You have a fever.  You have chills.  You feeling like you will vomit or you vomit. Summary  A urinary tract infection (UTI) is an infection of any part of the urinary tract.  This condition is caused by   germs in your genital area.  There are many risk factors for a UTI.  Treatment includes antibiotic medicines.  Drink enough fluid to keep your pee pale yellow. This information is not intended to replace advice given to you by your health care provider. Make sure you discuss any questions you have with your health care provider. Document Revised: 10/31/2019 Document Reviewed: 10/31/2019 Elsevier Patient Education  2021 Elsevier Inc.  

## 2020-05-28 NOTE — Progress Notes (Signed)
Acute Office Visit  Subjective:    Patient ID: Tracey Walton, female    DOB: June 07, 1940, 80 y.o.   MRN: [id number]  Chief Complaint  Patient presents with  . Sinus Problem  . Urinary Tract Infection    HPI Patient is in today for sinus problems. She states that she has been having nasal and head congestion, headache, and generalized fatigue for past few weeks. She denies fever, chills, or body aches. She denies nausea, vomiting, or diarrhea. She denies dizziness or unusual weakness. She has been afraid to take OTC medications because she does have high blood pressure and she knows that many decongestants can cause elevated blood pressure. She states that she took a home COVID 19 test this morning and was negative.  Patient also having dysuria, urinary frequency, and lower abdominal pain for past two weeks. She was evaluated for UTI on 05/17/2020. Urine culture was positive for e. Coli. She as treated with macrobid 100mg  twice daily for 7 days. She states that the symptoms never really improved or resolved. She has had a few urinary tract infections over the past several months. She has not consulted with urology at this point.   Past Medical History:  Diagnosis Date  . Anxiety   . Arthritis    "qwhere" (08/09/2017)  . Chest pain 2011   negative for MI  . Chronic kidney disease    renal insufficiency. 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 Chronic lower back pain   . Depression   . Dyspnea    SOB while walking  . GERD (gastroesophageal reflux disease)   . Heart murmur   . History of hiatal hernia   . Hyperlipidemia   . Hypertension   . Osteoporosis   . Pneumonia ~ 2014  . Polymyalgia (HCC)   . Strep throat     Past Surgical History:  Procedure Laterality Date  . ABDOMINAL HYSTERECTOMY     partial  . BILATERAL CARPAL TUNNEL RELEASE  2003-2005   right-left  . CATARACT EXTRACTION W/ INTRAOCULAR LENS  IMPLANT, BILATERAL Bilateral   . JOINT  REPLACEMENT    . REVERSE SHOULDER ARTHROPLASTY Left 10/12/2016   Procedure: REVERSE LEFT SHOULDER ARTHROPLASTY;  Surgeon: 12/13/2016, MD;  Location: [facility] OR;  Service: Orthopedics;  Laterality: Left;  . REVERSE SHOULDER ARTHROPLASTY Right 08/09/2017   Procedure: RIGHT REVERSE SHOULDER ARTHROPLASTY;  Surgeon: 10/09/2017, MD;  Location: [facility] OR;  Service: Orthopedics;  Laterality: Right;  . SHOULDER ARTHROSCOPY Right 2003  . TOOTH EXTRACTION     "1 tooth" (08/09/2017)  . TOTAL KNEE ARTHROPLASTY Bilateral     Family History  Problem Relation Age of Onset  . Heart attack Mother   . Heart disease Mother   . Hypertension Father   . Heart failure Father   . Kidney failure Father   . Hyperlipidemia Father   . Stroke Father   . Hypertension Sister   . Hyperlipidemia Sister     Social History   Socioeconomic History  . Marital status: Widowed    Spouse name: Not on file  . Number of children: 3  . Years of education: Not on file  . Highest education level: 10th grade  Occupational History  . Not on file  Tobacco Use  . Smoking status: Never Smoker  . Smokeless tobacco: Never Used  Vaping Use  . Vaping Use: Never used  Substance and Sexual Activity  . Alcohol use: Never  .  Drug use: Never  . Sexual activity: Not Currently    Birth control/protection: None  Other Topics Concern  . Not on file  Social History Narrative   Lives with daughter   caffeine not much at all   Social Determinants of Health   Financial Resource Strain: Not on file  Food Insecurity: Not on file  Transportation Needs: Not on file  Physical Activity: Not on file  Stress: Not on file  Social Connections: Not on file  Intimate Partner Violence: Not on file    Outpatient Medications Prior to Visit  Medication Sig Dispense Refill  . acetaminophen (TYLENOL) 325 MG tablet Take 650 mg by mouth 4 (four) times daily as needed (for pain.).    Marland Kitchen amLODipine (NORVASC) 2.5 MG tablet TAKE 1 TABLET BY MOUTH IN   THE EVENING 90 tablet 3  . atorvastatin (LIPITOR) 40 MG tablet TAKE 1 TABLET BY MOUTH  DAILY AT 6 PM 90 tablet 3  . carvedilol (COREG) 6.25 MG tablet TAKE 1 TABLET BY MOUTH  TWICE DAILY 180 tablet 1  . cetirizine (ZYRTEC) 10 MG tablet Take 10 mg by mouth daily.    . Coenzyme Q10 100 MG capsule Take 200 mg by mouth daily.    . ferrous sulfate 325 (65 FE) MG tablet Take 1 tablet (325 mg total) by mouth daily with breakfast. Take on Monday, Wednesday, Fridays 90 tablet 1  . FLUoxetine (PROZAC) 20 MG capsule TAKE 1 CAPSULE BY MOUTH  DAILY 90 capsule 0  . hydrochlorothiazide (HYDRODIURIL) 25 MG tablet TAKE 1 TABLET BY MOUTH  DAILY 90 tablet 3  . HYDROcodone-acetaminophen (NORCO/VICODIN) 5-325 MG tablet Take 1 tablet by mouth every 6 (six) hours as needed for moderate pain or severe pain.     . methocarbamol (ROBAXIN) 500 MG tablet methocarbamol 500 mg tablet  Take 1 tablet twice a day by oral route as needed.    . naproxen (NAPROSYN) 500 MG tablet TAKE 1 TABLET BY MOUTH  TWICE DAILY WITH A MEAL 180 tablet 0  . nitrofurantoin, macrocrystal-monohydrate, (MACROBID) 100 MG capsule Take 1 capsule (100 mg total) by mouth 2 (two) times daily. 10 capsule 0  . omeprazole (PRILOSEC) 20 MG capsule TAKE 1 CAPSULE BY MOUTH  DAILY 90 capsule 0  . Polyethyl Glycol-Propyl Glycol 0.4-0.3 % SOLN Place 1 drop into both eyes 3 (three) times daily as needed (for dry/irritated eyes.).    Marland Kitchen fluticasone (FLONASE) 50 MCG/ACT nasal spray Place 2 sprays into both nostrils daily. 48 g 2   No facility-administered medications prior to visit.    Allergies  Allergen Reactions  . Penicillins Rash    Pt tolerated 2g ancef without issue 10/12/16    Review of Systems  Constitutional: Positive for fatigue. Negative for chills and fever.  HENT: Positive for congestion, postnasal drip, rhinorrhea, sinus pressure and sinus pain.   Respiratory: Negative for cough, shortness of breath and wheezing.   Gastrointestinal: Negative for  nausea and vomiting.  Genitourinary: Positive for dysuria, flank pain, frequency, pelvic pain and urgency.  Musculoskeletal:       Chronic back and joint pain. She see orthopedic and pain management providers for this.   Allergic/Immunologic: Positive for environmental allergies.  Neurological: Positive for headaches.       Objective:    Physical Exam Vitals and nursing note reviewed.  Constitutional:      Appearance: Normal appearance. She is well-developed and well-nourished. She is obese.  HENT:     Head: Normocephalic and atraumatic.  Right Ear: Tympanic membrane is erythematous and bulging.     Left Ear: Tympanic membrane is erythematous and bulging.     Nose: Congestion present.     Right Sinus: Maxillary sinus tenderness and frontal sinus tenderness present.     Left Sinus: Maxillary sinus tenderness and frontal sinus tenderness present.     Mouth/Throat:     Pharynx: Posterior oropharyngeal erythema present.  Eyes:     Pupils: Pupils are equal, round, and reactive to light.  Cardiovascular:     Rate and Rhythm: Normal rate and regular rhythm.     Pulses: Normal pulses.     Heart sounds: Normal heart sounds.  Pulmonary:     Effort: Pulmonary effort is normal.     Breath sounds: Normal breath sounds.  Abdominal:     General: Bowel sounds are normal.     Palpations: Abdomen is soft.     Tenderness: There is abdominal tenderness.     Comments: Mild, generalized tenderness of the abdomen with palpation.   Genitourinary:    Comments: Urine sample positive for small WBC, trace protein, and trace blood.  Musculoskeletal:        General: Normal range of motion.     Cervical back: Normal range of motion and neck supple.  Lymphadenopathy:     Cervical: No cervical adenopathy.  Skin:    General: Skin is warm and dry.     Capillary Refill: Capillary refill takes less than 2 seconds.  Neurological:     General: No focal deficit present.     Mental Status: She is alert  and oriented to person, place, and time.  Psychiatric:        Mood and Affect: Mood and affect and mood normal.     Today's Vitals   05/28/20 1133  BP: 134/80  Pulse: 78  Temp: 97.6 F (36.4 C)  SpO2: 96%  Weight: 228 lb 14.4 oz (103.8 kg)  Height: 5\' 5"  (1.651 m)   Body mass index is 38.09 kg/m.    Wt Readings from Last 3 Encounters:  05/28/20 228 lb 14.4 oz (103.8 kg)  05/17/20 230 lb (104.3 kg)  04/12/20 230 lb 8 oz (104.6 kg)    Health Maintenance Due  Topic Date Due  . TETANUS/TDAP  Never done  . DEXA SCAN  Never done  . INFLUENZA VACCINE  11/02/2019    There are no preventive care reminders to display for this patient.   Lab Results  Component Value Date   TSH 0.986 03/01/2020   Lab Results  Component Value Date   WBC 6.0 03/01/2020   HGB 13.0 03/01/2020   HCT 37.9 03/01/2020   MCV 93 03/01/2020   PLT 331 03/01/2020   Lab Results  Component Value Date   NA 143 03/01/2020   K 4.1 03/01/2020   CO2 24 03/01/2020   GLUCOSE 79 03/01/2020   BUN 33 (H) 03/01/2020   CREATININE 1.09 (H) 03/01/2020   BILITOT 0.6 03/01/2020   ALKPHOS 92 03/01/2020   AST 20 03/01/2020   ALT 15 03/01/2020   PROT 6.9 03/01/2020   ALBUMIN 4.2 03/01/2020   CALCIUM 9.6 03/01/2020   ANIONGAP 12 07/27/2017   GFR 40.43 (L) 12/18/2014   Lab Results  Component Value Date   CHOL 202 (H) 03/01/2020   Lab Results  Component Value Date   HDL 47 03/01/2020   Lab Results  Component Value Date   LDLCALC 142 (H) 03/01/2020   Lab Results  Component  Value Date   TRIG 74 03/01/2020   Lab Results  Component Value Date   CHOLHDL 4.3 03/01/2020   Lab Results  Component Value Date   HGBA1C 5.4 03/01/2020       Assessment & Plan:  1. Acute non-recurrent pansinusitis Negative COVID test this morning. Start Bactrim DS twice daliy for 10 days. Encouraged rest and increased fluids. Use OTC medications as needed and as indicated to relieve acute symptoms.   2. Urinary tract  infection with hematuria, site unspecified Patient treated on 05/17/2020 with macrobid. Urinalysis showing no improvement. Start bactrim DS twice daily for 10 days. Will send urine for culture and sensitivity and adjust antibiotic treatment as indicated. Retest urine in two weeks. If she continues to be positive for evidence of infection, will do trial preventive antibiotics and refer to urology as indicated.  - sulfamethoxazole-trimethoprim (BACTRIM DS) 800-160 MG tablet; Take 1 tablet by mouth 2 (two) times daily.  Dispense: 20 tablet; Refill: 0  3. Dysuria Recommend use of OTC AZO for bladder pain. Encouraged her to increase fluid intake, especially water and acidic juices, such as cranberry juice.  - POCT URINALYSIS DIP (CLINITEK) - Urine Culture   Problem List Items Addressed This Visit      Respiratory   Acute non-recurrent pansinusitis - Primary   Relevant Medications   sulfamethoxazole-trimethoprim (BACTRIM DS) 800-160 MG tablet   fluticasone (FLONASE) 50 MCG/ACT nasal spray     Genitourinary   Urinary tract infection with hematuria   Relevant Medications   sulfamethoxazole-trimethoprim (BACTRIM DS) 800-160 MG tablet     Other   Dysuria   Relevant Orders   POCT URINALYSIS DIP (CLINITEK) (Completed)   Urine Culture (Completed)       Meds ordered this encounter  Medications  . sulfamethoxazole-trimethoprim (BACTRIM DS) 800-160 MG tablet    Sig: Take 1 tablet by mouth 2 (two) times daily.    Dispense:  20 tablet    Refill:  0    Order Specific Question:   Supervising Provider    Answer:   Nani Gasser D [[id number]]  . fluticasone (FLONASE) 50 MCG/ACT nasal spray    Sig: Place 2 sprays into both nostrils daily.    Dispense:  48 g    Refill:  2   Time spent with the patient was approximately 30 minutes. This time included reviewing progress notes, labs, imaging studies, and discussing plan for follow up.   Carlean Jews, NP

## 2020-05-28 NOTE — Telephone Encounter (Signed)
patient is having COVID symptoms- she has a home test she will take this morning, and will call [facility] once results are shown. thank you

## 2020-05-31 ENCOUNTER — Encounter: Payer: Self-pay | Admitting: Nurse Practitioner

## 2020-05-31 ENCOUNTER — Ambulatory Visit: Payer: Medicare Other | Admitting: Cardiovascular Disease

## 2020-05-31 DIAGNOSIS — N390 Urinary tract infection, site not specified: Secondary | ICD-10-CM | POA: Insufficient documentation

## 2020-05-31 DIAGNOSIS — J0140 Acute pansinusitis, unspecified: Secondary | ICD-10-CM | POA: Insufficient documentation

## 2020-05-31 DIAGNOSIS — R319 Hematuria, unspecified: Secondary | ICD-10-CM | POA: Insufficient documentation

## 2020-05-31 DIAGNOSIS — R300 Dysuria: Secondary | ICD-10-CM | POA: Insufficient documentation

## 2020-05-31 LAB — URINE CULTURE

## 2020-05-31 NOTE — Progress Notes (Signed)
Patient treated with bactrim DS twice daily at the time of her visit.

## 2020-06-01 ENCOUNTER — Other Ambulatory Visit: Payer: Self-pay | Admitting: Physician Assistant

## 2020-06-04 ENCOUNTER — Other Ambulatory Visit: Payer: Self-pay

## 2020-06-04 ENCOUNTER — Telehealth: Payer: Self-pay | Admitting: Physician Assistant

## 2020-06-04 ENCOUNTER — Encounter: Payer: Self-pay | Admitting: Nurse Practitioner

## 2020-06-04 ENCOUNTER — Ambulatory Visit (INDEPENDENT_AMBULATORY_CARE_PROVIDER_SITE_OTHER): Payer: Medicare Other | Admitting: Nurse Practitioner

## 2020-06-04 VITALS — BP 147/78 | HR 77 | Temp 97.2°F

## 2020-06-04 DIAGNOSIS — R296 Repeated falls: Secondary | ICD-10-CM

## 2020-06-04 DIAGNOSIS — R42 Dizziness and giddiness: Secondary | ICD-10-CM | POA: Diagnosis not present

## 2020-06-04 DIAGNOSIS — N390 Urinary tract infection, site not specified: Secondary | ICD-10-CM | POA: Diagnosis not present

## 2020-06-04 DIAGNOSIS — R319 Hematuria, unspecified: Secondary | ICD-10-CM

## 2020-06-04 LAB — POCT URINALYSIS DIPSTICK
Bilirubin, UA: NEGATIVE
Glucose, UA: NEGATIVE
Ketones, UA: NEGATIVE
Leukocytes, UA: NEGATIVE
Nitrite, UA: NEGATIVE
Protein, UA: NEGATIVE
Spec Grav, UA: 1.01 (ref 1.010–1.025)
Urobilinogen, UA: 0.2 E.U./dL
pH, UA: 5 (ref 5.0–8.0)

## 2020-06-04 MED ORDER — CEFTRIAXONE SODIUM 1 G IJ SOLR
1.0000 g | Freq: Once | INTRAMUSCULAR | Status: AC
  Administered 2020-06-04: 1 g via INTRAMUSCULAR

## 2020-06-04 NOTE — Progress Notes (Signed)
Acute Office Visit  Subjective:    Patient ID: Tracey Walton, female    DOB: 1940-11-07, 80 y.o.   MRN: [id number]  Chief Complaint  Patient presents with  . Dizziness    HPI Patient is in today for recurrent UTI. She was last seen 05/28/2020 and treated for UTI. Was put on bactrim DS  on 05/28/2020 and prior to that was placed on macrobid  on 05/17/2020. She was seen three times previous to that on 04/12/2020, 04/07/2020, and 03/24/2020 due to urinary frequency and urinary urgency.Earliest visits did not result in antibiotics or other treatment at time of visits. Today, she continues to have trouble with  Pelvic pain and pressure in bladder when urinating. She states that she continues to have urinary frequency. She has noted some blood on toilet tissue when wiping after urination. Reviewed urine culture results from visit 05/28/2020. Results indicate that macrobid and bactrim DS were both appropriate antibiotics. Today, in addition to these symptoms, she is complaining of weakness and feeling unsteady on her feet. States that she has fallen a few times because of the unsteadiness. She has also had increased nausea without vomiting.   Past Medical History:  Diagnosis Date  . Anxiety   . Arthritis    "qwhere" (08/09/2017)  . Chest pain 2011   negative for MI  . Chronic kidney disease    renal insufficiency. 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 Chronic lower back pain   . Depression   . Dyspnea    SOB while walking  . GERD (gastroesophageal reflux disease)   . Heart murmur   . History of hiatal hernia   . Hyperlipidemia   . Hypertension   . Osteoporosis   . Pneumonia ~ 2014  . Polymyalgia (HCC)   . Strep throat     Past Surgical History:  Procedure Laterality Date  . ABDOMINAL HYSTERECTOMY     partial  . BILATERAL CARPAL TUNNEL RELEASE  2003-2005   right-left  . CATARACT EXTRACTION W/ INTRAOCULAR LENS  IMPLANT, BILATERAL Bilateral   . JOINT  REPLACEMENT    . REVERSE SHOULDER ARTHROPLASTY Left 10/12/2016   Procedure: REVERSE LEFT SHOULDER ARTHROPLASTY;  Surgeon: Francena Hanly, MD;  Location: [facility] OR;  Service: Orthopedics;  Laterality: Left;  . REVERSE SHOULDER ARTHROPLASTY Right 08/09/2017   Procedure: RIGHT REVERSE SHOULDER ARTHROPLASTY;  Surgeon: Francena Hanly, MD;  Location: [facility] OR;  Service: Orthopedics;  Laterality: Right;  . SHOULDER ARTHROSCOPY Right 2003  . TOOTH EXTRACTION     "1 tooth" (08/09/2017)  . TOTAL KNEE ARTHROPLASTY Bilateral     Family History  Problem Relation Age of Onset  . Heart attack Mother   . Heart disease Mother   . Hypertension Father   . Heart failure Father   . Kidney failure Father   . Hyperlipidemia Father   . Stroke Father   . Hypertension Sister   . Hyperlipidemia Sister     Social History   Socioeconomic History  . Marital status: Widowed    Spouse name: Not on file  . Number of children: 3  . Years of education: Not on file  . Highest education level: 10th grade  Occupational History  . Not on file  Tobacco Use  . Smoking status: Never Smoker  . Smokeless tobacco: Never Used  Vaping Use  . Vaping Use: Never used  Substance and Sexual Activity  . Alcohol use: Never  . Drug use: Never  .  Sexual activity: Not Currently    Birth control/protection: None  Other Topics Concern  . Not on file  Social History Narrative   Lives with daughter   caffeine not much at all   Social Determinants of Health   Financial Resource Strain: Not on file  Food Insecurity: Not on file  Transportation Needs: Not on file  Physical Activity: Not on file  Stress: Not on file  Social Connections: Not on file  Intimate Partner Violence: Not on file    Outpatient Medications Prior to Visit  Medication Sig Dispense Refill  . acetaminophen (TYLENOL) 325 MG tablet Take 650 mg by mouth 4 (four) times daily as needed (for pain.).    Marland Kitchen. amLODipine (NORVASC) 2.5 MG tablet TAKE 1 TABLET BY MOUTH IN   THE EVENING 90 tablet 3  . atorvastatin (LIPITOR) 40 MG tablet TAKE 1 TABLET BY MOUTH  DAILY AT 6 PM 90 tablet 3  . carvedilol (COREG) 6.25 MG tablet TAKE 1 TABLET BY MOUTH  TWICE DAILY 180 tablet 1  . cetirizine (ZYRTEC) 10 MG tablet Take 10 mg by mouth daily.    . Coenzyme Q10 100 MG capsule Take 200 mg by mouth daily.    . ferrous sulfate 325 (65 FE) MG tablet Take 1 tablet (325 mg total) by mouth daily with breakfast. Take on Monday, Wednesday, Fridays 90 tablet 1  . FLUoxetine (PROZAC) 20 MG capsule TAKE 1 CAPSULE BY MOUTH  DAILY 90 capsule 0  . fluticasone (FLONASE) 50 MCG/ACT nasal spray Place 2 sprays into both nostrils daily. 48 g 2  . hydrochlorothiazide (HYDRODIURIL) 25 MG tablet TAKE 1 TABLET BY MOUTH  DAILY 90 tablet 3  . HYDROcodone-acetaminophen (NORCO/VICODIN) 5-325 MG tablet Take 1 tablet by mouth every 6 (six) hours as needed for moderate pain or severe pain.     . methocarbamol (ROBAXIN) 500 MG tablet methocarbamol 500 mg tablet  Take 1 tablet twice a day by oral route as needed.    . naproxen (NAPROSYN) 500 MG tablet TAKE 1 TABLET BY MOUTH  TWICE DAILY WITH A MEAL 180 tablet 0  . nitrofurantoin, macrocrystal-monohydrate, (MACROBID) 100 MG capsule Take 1 capsule (100 mg total) by mouth 2 (two) times daily. 10 capsule 0  . omeprazole (PRILOSEC) 20 MG capsule TAKE 1 CAPSULE BY MOUTH  DAILY 90 capsule 0  . Polyethyl Glycol-Propyl Glycol 0.4-0.3 % SOLN Place 1 drop into both eyes 3 (three) times daily as needed (for dry/irritated eyes.).    Marland Kitchen. sulfamethoxazole-trimethoprim (BACTRIM DS) 800-160 MG tablet Take 1 tablet by mouth 2 (two) times daily. 20 tablet 0   No facility-administered medications prior to visit.    Allergies  Allergen Reactions  . Penicillins Rash    Pt tolerated 2g ancef without issue 10/12/16    Review of Systems  Constitutional: Positive for activity change and fatigue.  HENT:       Ears continue to feel full and "stopped up."  Gastrointestinal:  Positive for nausea.  Genitourinary: Positive for dysuria.  Musculoskeletal: Positive for arthralgias and myalgias.  Neurological: Positive for dizziness and weakness.       Objective:    Physical Exam Vitals and nursing note reviewed.  Constitutional:      Appearance: Normal appearance. She is well-developed and well-nourished. She is obese.  HENT:     Head: Normocephalic and atraumatic.  Eyes:     Pupils: Pupils are equal, round, and reactive to light.  Cardiovascular:     Rate and Rhythm: Normal  rate and regular rhythm.     Heart sounds: Murmur heard.    Pulmonary:     Effort: Pulmonary effort is normal.     Breath sounds: Normal breath sounds.  Abdominal:     General: Bowel sounds are normal.     Palpations: Abdomen is soft.     Tenderness: There is abdominal tenderness.     Comments: Mild suprapubic tenderness with palpation.   Genitourinary:    Comments: Urine sample positive for trace blood only.  Musculoskeletal:        General: Normal range of motion.     Cervical back: Normal range of motion and neck supple.  Skin:    General: Skin is warm and dry.  Neurological:     General: No focal deficit present.     Mental Status: She is alert and oriented to person, place, and time.  Psychiatric:        Mood and Affect: Mood and affect and mood normal.        Behavior: Behavior normal.        Thought Content: Thought content normal.        Judgment: Judgment normal.     Today's Vitals   06/04/20 1113  BP: (!) 147/78  Pulse: 77  Temp: (!) 97.2 F (36.2 C)  SpO2: 96%   There is no height or weight on file to calculate BMI.   Wt Readings from Last 3 Encounters:  05/28/20 228 lb 14.4 oz (103.8 kg)  05/17/20 230 lb (104.3 kg)  04/12/20 230 lb 8 oz (104.6 kg)    Health Maintenance Due  Topic Date Due  . TETANUS/TDAP  Never done  . DEXA SCAN  Never done    There are no preventive care reminders to display for this patient.   Lab Results  Component  Value Date   TSH 0.986 03/01/2020   Lab Results  Component Value Date   WBC 6.6 06/04/2020   HGB 12.0 06/04/2020   HCT 36.3 06/04/2020   MCV 95 06/04/2020   PLT 333 06/04/2020   Lab Results  Component Value Date   NA 126 (L) 06/04/2020   K 4.4 06/04/2020   CO2 20 06/04/2020   GLUCOSE 106 (H) 06/04/2020   BUN 30 (H) 06/04/2020   CREATININE 1.53 (H) 06/04/2020   BILITOT 0.6 03/01/2020   ALKPHOS 92 03/01/2020   AST 20 03/01/2020   ALT 15 03/01/2020   PROT 6.9 03/01/2020   ALBUMIN 4.2 03/01/2020   CALCIUM 9.1 06/04/2020   ANIONGAP 12 07/27/2017   GFR 40.43 (L) 12/18/2014   Lab Results  Component Value Date   CHOL 202 (H) 03/01/2020   Lab Results  Component Value Date   HDL 47 03/01/2020   Lab Results  Component Value Date   LDLCALC 142 (H) 03/01/2020   Lab Results  Component Value Date   TRIG 74 03/01/2020   Lab Results  Component Value Date   CHOLHDL 4.3 03/01/2020   Lab Results  Component Value Date   HGBA1C 5.4 03/01/2020       Assessment & Plan:  1. Recurrent UTI (urinary tract infection) Properly treated with macrobid then bactrim DS, both twice daily for prior UTI. Today, u/a positive for trace blood only. Will refer to urology for further evaluating.   2. Hematuria, unspecified type U/a positive for trace blood. Treated with one gram rocephin injection. Will send new sample for culture and sensitivity. Check BMP and CBC for further evaluation. Refer to  urology for further evaluation.  - POCT urinalysis dipstick - Urine Culture - Ambulatory referral to Urology - Basic Metabolic Panel (BMET) - CBC - cefTRIAXone (ROCEPHIN) injection 1 g  3. Dizziness Check BMP and CBC for further evaluation.  - Basic Metabolic Panel (BMET) - CBC  4. Falls frequently Possibly secondary to recurrent infection. Check BMP and CBC for further evaluation. Information regarding falls prevention given to patinet.  - Basic Metabolic Panel (BMET) - CBC   Problem  List Items Addressed This Visit      Genitourinary   Recurrent UTI (urinary tract infection) - Primary     Other   Dizziness   Relevant Orders   Basic Metabolic Panel (BMET) (Completed)   CBC (Completed)   Hematuria   Relevant Orders   POCT urinalysis dipstick (Completed)   Urine Culture (Completed)   Ambulatory referral to Urology   Basic Metabolic Panel (BMET) (Completed)   CBC (Completed)   Falls frequently   Relevant Orders   Basic Metabolic Panel (BMET) (Completed)   CBC (Completed)       Meds ordered this encounter  Medications  . cefTRIAXone (ROCEPHIN) injection 1 g    Order Specific Question:   Antibiotic Indication:    Answer:   UTI   Time spent with the patient was approximately 45 minutes. This time included reviewing progress notes, labs, imaging studies, and discussing plan for follow up.   Carlean Jews, NP

## 2020-06-04 NOTE — Telephone Encounter (Signed)
Please add pt to [facility] at 1045am

## 2020-06-04 NOTE — Telephone Encounter (Signed)
Patient advised there is blood in her urine, please call. Thank you

## 2020-06-04 NOTE — Patient Instructions (Signed)
Fall Prevention in the Home, Adult Falls can cause injuries and can happen to people of all ages. There are many things you can do to make your home safe and to help prevent falls. Ask for help when making these changes. What actions can I take to prevent falls? General Instructions  Use good lighting in all rooms. Replace any light bulbs that burn out.  Turn on the lights in dark areas. Use night-lights.  Keep items that you use often in easy-to-reach places. Lower the shelves around your home if needed.  Set up your furniture so you have a clear path. Avoid moving your furniture around.  Do not have throw rugs or other things on the floor that can make you trip.  Avoid walking on wet floors.  If any of your floors are uneven, fix them.  Add color or contrast paint or tape to clearly mark and help you see: ? Grab bars or handrails. ? First and last steps of staircases. ? Where the edge of each step is.  If you use a stepladder: ? Make sure that it is fully opened. Do not climb a closed stepladder. ? Make sure the sides of the stepladder are locked in place. ? Ask someone to hold the stepladder while you use it.  Know where your pets are when moving through your home. What can I do in the bathroom?  Keep the floor dry. Clean up any water on the floor right away.  Remove soap buildup in the tub or shower.  Use nonskid mats or decals on the floor of the tub or shower.  Attach bath mats securely with double-sided, nonslip rug tape.  If you need to sit down in the shower, use a plastic, nonslip stool.  Install grab bars by the toilet and in the tub and shower. Do not use towel bars as grab bars.      What can I do in the bedroom?  Make sure that you have a light by your bed that is easy to reach.  Do not use any sheets or blankets for your bed that hang to the floor.  Have a firm chair with side arms that you can use for support when you get dressed. What can I do in  the kitchen?  Clean up any spills right away.  If you need to reach something above you, use a step stool with a grab bar.  Keep electrical cords out of the way.  Do not use floor polish or wax that makes floors slippery. What can I do with my stairs?  Do not leave any items on the stairs.  Make sure that you have a light switch at the top and the bottom of the stairs.  Make sure that there are handrails on both sides of the stairs. Fix handrails that are broken or loose.  Install nonslip stair treads on all your stairs.  Avoid having throw rugs at the top or bottom of the stairs.  Choose a carpet that does not hide the edge of the steps on the stairs.  Check carpeting to make sure that it is firmly attached to the stairs. Fix carpet that is loose or worn. What can I do on the outside of my home?  Use bright outdoor lighting.  Fix the edges of walkways and driveways and fix any cracks.  Remove anything that might make you trip as you walk through a door, such as a raised step or threshold.  Trim any   bushes or trees on paths to your home.  Check to see if handrails are loose or broken and that both sides of all steps have handrails.  Install guardrails along the edges of any raised decks and porches.  Clear paths of anything that can make you trip, such as tools or rocks.  Have leaves, snow, or ice cleared regularly.  Use sand or salt on paths during winter.  Clean up any spills in your garage right away. This includes grease or oil spills. What other actions can I take?  Wear shoes that: ? Have a low heel. Do not wear high heels. ? Have rubber bottoms. ? Feel good on your feet and fit well. ? Are closed at the toe. Do not wear open-toe sandals.  Use tools that help you move around if needed. These include: ? Canes. ? Walkers. ? Scooters. ? Crutches.  Review your medicines with your doctor. Some medicines can make you feel dizzy. This can increase your chance  of falling. Ask your doctor what else you can do to help prevent falls. Where to find more information  Centers for Disease Control and Prevention, [employer]: www.cdc.gov  National Institute on Aging: www.nia.nih.gov Contact a doctor if:  You are afraid of falling at home.  You feel weak, drowsy, or dizzy at home.  You fall at home. Summary  There are many simple things that you can do to make your home safe and to help prevent falls.  Ways to make your home safe include removing things that can make you trip and installing grab bars in the bathroom.  Ask for help when making these changes in your home. This information is not intended to replace advice given to you by your health care provider. Make sure you discuss any questions you have with your health care provider. Document Revised: 10/22/2019 Document Reviewed: 10/22/2019 Elsevier Patient Education  2021 Elsevier Inc.  

## 2020-06-05 LAB — BASIC METABOLIC PANEL
BUN/Creatinine Ratio: 20 (ref 12–28)
BUN: 30 mg/dL — ABNORMAL HIGH (ref 8–27)
CO2: 20 mmol/L (ref 20–29)
Calcium: 9.1 mg/dL (ref 8.7–10.3)
Chloride: 91 mmol/L — ABNORMAL LOW (ref 96–106)
Creatinine, Ser: 1.53 mg/dL — ABNORMAL HIGH (ref 0.57–1.00)
Glucose: 106 mg/dL — ABNORMAL HIGH (ref 65–99)
Potassium: 4.4 mmol/L (ref 3.5–5.2)
Sodium: 126 mmol/L — ABNORMAL LOW (ref 134–144)
eGFR: 34 mL/min/{1.73_m2} — ABNORMAL LOW (ref >59)

## 2020-06-05 LAB — CBC
Hematocrit: 36.3 % (ref 34.0–46.6)
Hemoglobin: 12 g/dL (ref 11.1–15.9)
MCH: 31.4 pg (ref 26.6–33.0)
MCHC: 33.1 g/dL (ref 31.5–35.7)
MCV: 95 fL (ref 79–97)
Platelets: 333 10*3/uL (ref 150–450)
RBC: 3.82 x10E6/uL (ref 3.77–5.28)
RDW: 13 % (ref 11.7–15.4)
WBC: 6.6 10*3/uL (ref 3.4–10.8)

## 2020-06-07 DIAGNOSIS — R319 Hematuria, unspecified: Secondary | ICD-10-CM | POA: Insufficient documentation

## 2020-06-07 DIAGNOSIS — N390 Urinary tract infection, site not specified: Secondary | ICD-10-CM | POA: Insufficient documentation

## 2020-06-07 DIAGNOSIS — R296 Repeated falls: Secondary | ICD-10-CM | POA: Insufficient documentation

## 2020-06-07 LAB — URINE CULTURE

## 2020-06-07 NOTE — Progress Notes (Signed)
Hi. Can you let the patient know that her labs show that her sodium levels are very low. This may be contributing to her symptoms of dizziness and poor balanc. For the next few days I want her to stop her hydrochlorothiazide and increase the water and even salt in her diet. We will recheck this level at her visit on Wednesday. Thanks.

## 2020-06-08 ENCOUNTER — Other Ambulatory Visit: Payer: Self-pay | Admitting: Cardiovascular Disease

## 2020-06-08 DIAGNOSIS — I10 Essential (primary) hypertension: Secondary | ICD-10-CM

## 2020-06-09 ENCOUNTER — Telehealth: Payer: Self-pay | Admitting: Physician Assistant

## 2020-06-09 ENCOUNTER — Ambulatory Visit: Payer: Medicare Other | Admitting: Physician Assistant

## 2020-06-09 NOTE — Telephone Encounter (Signed)
Pt to be added to Friday schedule for [facility] at 945am. Sent secure chat msg to [place] to schedule. AS, CMA

## 2020-06-09 NOTE — Telephone Encounter (Signed)
Patient states she needs an appointment for a urine check and that she spoke with the CMA. Please advise, thanks.

## 2020-06-11 ENCOUNTER — Encounter: Payer: Self-pay | Admitting: Nurse Practitioner

## 2020-06-11 ENCOUNTER — Other Ambulatory Visit: Payer: Self-pay

## 2020-06-11 ENCOUNTER — Ambulatory Visit (INDEPENDENT_AMBULATORY_CARE_PROVIDER_SITE_OTHER): Payer: Medicare Other | Admitting: Nurse Practitioner

## 2020-06-11 VITALS — BP 117/73 | HR 63 | Temp 97.0°F | Ht 64.0 in | Wt 229.3 lb

## 2020-06-11 DIAGNOSIS — R300 Dysuria: Secondary | ICD-10-CM

## 2020-06-11 DIAGNOSIS — N390 Urinary tract infection, site not specified: Secondary | ICD-10-CM

## 2020-06-11 DIAGNOSIS — E871 Hypo-osmolality and hyponatremia: Secondary | ICD-10-CM | POA: Diagnosis not present

## 2020-06-11 DIAGNOSIS — R42 Dizziness and giddiness: Secondary | ICD-10-CM

## 2020-06-11 LAB — POCT URINALYSIS DIPSTICK
Bilirubin, UA: NEGATIVE
Blood, UA: NEGATIVE
Glucose, UA: NEGATIVE
Ketones, UA: NEGATIVE
Nitrite, UA: NEGATIVE
Protein, UA: NEGATIVE
Spec Grav, UA: <=1.005 — AB (ref 1.010–1.025)
Urobilinogen, UA: 0.2 E.U./dL
pH, UA: 5.5 (ref 5.0–8.0)

## 2020-06-11 NOTE — Patient Instructions (Signed)
Hyponatremia Hyponatremia is when the amount of salt (sodium) in your blood is too low. When salt levels are low, your body may take in extra water. This can cause swelling throughout the body. The swelling often affects the brain. What are the causes? This condition may be caused by:  Certain medical problems or conditions.  Vomiting a lot.  Having watery poop (diarrhea) often.  Certain medicines or illegal drugs.  Not having enough water in the body (dehydration).  Drinking too much water.  Eating a diet that is low in salt.  Large burns on your body.  Too much sweating. What increases the risk? You are more likely to get this condition if you:  Have long-term (chronic) kidney disease.  Have heart failure.  Have a medical condition that causes you to have watery poop often.  Do very hard exercises.  Take medicines that affect the amount of salt is in your blood. What are the signs or symptoms? Symptoms of this condition include:  Headache.  Feeling like you may vomit (nausea).  Vomiting.  Being very tired (lethargic).  Muscle weakness and cramps.  Not wanting to eat as much as normal (loss of appetite).  Feeling weak or light-headed. Severe symptoms of this condition include:  Confusion.  Feeling restless (agitation).  Having a fast heart rate.  Passing out (fainting).  Seizures.  Coma. How is this treated? Treatment for this condition depends on the cause. Treatment may include:  Getting fluids through an IV tube that is put into one of your veins.  Taking medicines to fix the salt levels in your blood. If medicines are causing the problem, your medicines will need to be changed.  Limiting how much water or fluid you take in.  Monitoring in the hospital to watch your symptoms.   Follow these instructions at home:  Take over-the-counter and prescription medicines only as told by your doctor. Many medicines can make this condition worse.  Talk with your doctor about any medicines that you are taking.  Eat and drink exactly as you are told by your doctor. ? Eat only the foods you are told to eat. ? Limit how much fluid you take.  Do not drink alcohol.  Keep all follow-up visits as told by your doctor. This is important.   Contact a doctor if:  You feel more like you may vomit.  You feel more tired.  Your headache gets worse.  You feel more confused.  You feel weaker.  Your symptoms go away and then they come back.  You have trouble following the diet instructions. Get help right away if:  You have a seizure.  You pass out.  You keep having watery poop.  You keep vomiting. Summary  Hyponatremia is when the amount of salt in your blood is too low.  When salt levels are low, you can have swelling throughout the body. The swelling mostly affects the brain.  Treatment depends on the cause. Treatment may include getting IV fluids, medicines, or not drinking as much fluid. This information is not intended to replace advice given to you by your health care provider. Make sure you discuss any questions you have with your health care provider. Document Revised: 06/06/2018 Document Reviewed: 02/21/2018 Elsevier Patient Education  2021 [employer] Inc.  

## 2020-06-11 NOTE — Progress Notes (Signed)
Established Patient Office Visit  Subjective:  Patient ID: Tracey Walton, female    DOB: 02/01/1941  Age: 80 y.o. MRN: [id number]  CC:  Chief Complaint  Patient presents with  . Follow-up    HPI Tracey SetaRebecca V Mossbarger presents for evaluate chronic urinary tract infection. She has been seen several times recently, 06/04/2020, 05/28/2020, 04/12/2020, 04/07/2020, and 03/24/2020 due to urinary frequency and urinary urgency. Earliest visits did not result in antibiotics or other treatment at time of visits. Most recent visit, on 05/28/2020, she had similar symptoms, along with dizziness, weakness, and recent falls. Today, she states she is feeling much better. She is no longer having urinary frequency, urgency, dysuria, or abdominal pain. She states that dizziness and weakness has improved. She states that she is still not driving, but feels like she could without difficulty. She had labs done at her last her last visit bacause of dizziness and weakness. Her sodium level was very low at 126 and renal functions were slightly elevated. She has been holding her diuretic since her last visit. She will have BMP rechecked today. Her urine sample indicates that infection is resolving. There is small WBC in the urine and it is otherwise clear.   Past Medical History:  Diagnosis Date  . Anxiety   . Arthritis    "qwhere" (08/09/2017)  . Chest pain 2011   negative for MI  . Chronic kidney disease    renal insufficiency. 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 Chronic lower back pain   . Depression   . Dyspnea    SOB while walking  . GERD (gastroesophageal reflux disease)   . Heart murmur   . History of hiatal hernia   . Hyperlipidemia   . Hypertension   . Osteoporosis   . Pneumonia ~ 2014  . Polymyalgia (HCC)   . Strep throat     Past Surgical History:  Procedure Laterality Date  . ABDOMINAL HYSTERECTOMY     partial  . BILATERAL CARPAL TUNNEL RELEASE  2003-2005    right-left  . CATARACT EXTRACTION W/ INTRAOCULAR LENS  IMPLANT, BILATERAL Bilateral   . JOINT REPLACEMENT    . REVERSE SHOULDER ARTHROPLASTY Left 10/12/2016   Procedure: REVERSE LEFT SHOULDER ARTHROPLASTY;  Surgeon: Francena HanlySupple, Kevin, MD;  Location: [facility];  Service: Orthopedics;  Laterality: Left;  . REVERSE SHOULDER ARTHROPLASTY Right 08/09/2017   Procedure: RIGHT REVERSE SHOULDER ARTHROPLASTY;  Surgeon: Francena HanlySupple, Kevin, MD;  Location: [facility] OR;  Service: Orthopedics;  Laterality: Right;  . SHOULDER ARTHROSCOPY Right 2003  . TOOTH EXTRACTION     "1 tooth" (08/09/2017)  . TOTAL KNEE ARTHROPLASTY Bilateral     Family History  Problem Relation Age of Onset  . Heart attack Mother   . Heart disease Mother   . Hypertension Father   . Heart failure Father   . Kidney failure Father   . Hyperlipidemia Father   . Stroke Father   . Hypertension Sister   . Hyperlipidemia Sister     Social History   Socioeconomic History  . Marital status: Widowed    Spouse name: Not on file  . Number of children: 3  . Years of education: Not on file  . Highest education level: 10th grade  Occupational History  . Not on file  Tobacco Use  . Smoking status: Never Smoker  . Smokeless tobacco: Never Used  Vaping Use  . Vaping Use: Never used  Substance and Sexual Activity  .  Alcohol use: Never  . Drug use: Never  . Sexual activity: Not Currently    Birth control/protection: None  Other Topics Concern  . Not on file  Social History Narrative   Lives with daughter   caffeine not much at all   Social Determinants of Health   Financial Resource Strain: Not on file  Food Insecurity: Not on file  Transportation Needs: Not on file  Physical Activity: Not on file  Stress: Not on file  Social Connections: Not on file  Intimate Partner Violence: Not on file    Outpatient Medications Prior to Visit  Medication Sig Dispense Refill  . acetaminophen (TYLENOL) 325 MG tablet Take 650 mg by mouth 4 (four) times  daily as needed (for pain.).    Marland Kitchen amLODipine (NORVASC) 2.5 MG tablet TAKE 1 TABLET BY MOUTH IN  THE EVENING 90 tablet 3  . atorvastatin (LIPITOR) 40 MG tablet TAKE 1 TABLET BY MOUTH  DAILY AT 6 PM 90 tablet 3  . carvedilol (COREG) 6.25 MG tablet TAKE 1 TABLET BY MOUTH  TWICE DAILY 180 tablet 1  . cetirizine (ZYRTEC) 10 MG tablet Take 10 mg by mouth daily.    . Coenzyme Q10 100 MG capsule Take 200 mg by mouth daily.    . ferrous sulfate 325 (65 FE) MG tablet Take 1 tablet (325 mg total) by mouth daily with breakfast. Take on Monday, Wednesday, Fridays 90 tablet 1  . FLUoxetine (PROZAC) 20 MG capsule TAKE 1 CAPSULE BY MOUTH  DAILY 90 capsule 0  . fluticasone (FLONASE) 50 MCG/ACT nasal spray Place 2 sprays into both nostrils daily. 48 g 2  . hydrochlorothiazide (HYDRODIURIL) 25 MG tablet TAKE 1 TABLET BY MOUTH  DAILY 90 tablet 3  . HYDROcodone-acetaminophen (NORCO/VICODIN) 5-325 MG tablet Take 1 tablet by mouth every 6 (six) hours as needed for moderate pain or severe pain.     . methocarbamol (ROBAXIN) 500 MG tablet methocarbamol 500 mg tablet  Take 1 tablet twice a day by oral route as needed.    . naproxen (NAPROSYN) 500 MG tablet TAKE 1 TABLET BY MOUTH  TWICE DAILY WITH A MEAL 180 tablet 0  . nitrofurantoin, macrocrystal-monohydrate, (MACROBID) 100 MG capsule Take 1 capsule (100 mg total) by mouth 2 (two) times daily. 10 capsule 0  . omeprazole (PRILOSEC) 20 MG capsule TAKE 1 CAPSULE BY MOUTH  DAILY 90 capsule 0  . Polyethyl Glycol-Propyl Glycol 0.4-0.3 % SOLN Place 1 drop into both eyes 3 (three) times daily as needed (for dry/irritated eyes.).    Marland Kitchen sulfamethoxazole-trimethoprim (BACTRIM DS) 800-160 MG tablet Take 1 tablet by mouth 2 (two) times daily. 20 tablet 0   No facility-administered medications prior to visit.    Allergies  Allergen Reactions  . Penicillins Rash    Pt tolerated 2g ancef without issue 10/12/16    ROS Review of Systems  Constitutional: Positive for activity  change and fatigue. Negative for chills and fever.       Patient states she is still not driving due to persistent dizziness. This is gradually improving.   HENT: Negative for congestion and sinus pain.   Respiratory: Negative for apnea, cough and wheezing.   Cardiovascular: Negative for chest pain and palpitations.  Gastrointestinal: Negative for abdominal pain and constipation.  Genitourinary: Negative for dysuria, frequency and urgency.  Musculoskeletal: Positive for back pain. Negative for myalgias.  Skin: Negative for rash.  Neurological: Positive for weakness. Negative for dizziness and headaches.  Psychiatric/Behavioral: The patient is not  nervous/anxious.   All other systems reviewed and are negative.     Objective:    Physical Exam Vitals and nursing note reviewed.  Constitutional:      Appearance: Normal appearance. She is well-developed.  HENT:     Head: Normocephalic.     Nose: Nose normal.  Eyes:     Pupils: Pupils are equal, round, and reactive to light.  Neck:     Vascular: No carotid bruit.  Cardiovascular:     Rate and Rhythm: Normal rate and regular rhythm.     Pulses: Normal pulses.     Heart sounds: Murmur heard.    Pulmonary:     Effort: Pulmonary effort is normal.     Breath sounds: Normal breath sounds.  Abdominal:     General: Bowel sounds are normal.     Palpations: Abdomen is soft.     Tenderness: There is no abdominal tenderness.  Musculoskeletal:        General: Normal range of motion.     Cervical back: Normal range of motion and neck supple.     Comments: patinet using a walker to help with ambulation.   Skin:    General: Skin is warm and dry.  Neurological:     General: No focal deficit present.     Mental Status: She is alert and oriented to person, place, and time.  Psychiatric:        Mood and Affect: Mood normal.        Behavior: Behavior normal.        Thought Content: Thought content normal.        Judgment: Judgment normal.      Today's Vitals   06/11/20 1009 06/11/20 1022  BP: (!) 171/72 117/73  Pulse: 67 63  Temp: (!) 97 F (36.1 C)   SpO2: 95%   Weight: 229 lb 4.8 oz (104 kg)    Body mass index is 38.16 kg/m.    Wt Readings from Last 3 Encounters:  06/11/20 229 lb 4.8 oz (104 kg)  05/28/20 228 lb 14.4 oz (103.8 kg)  05/17/20 230 lb (104.3 kg)     Health Maintenance Due  Topic Date Due  . TETANUS/TDAP  Never done  . DEXA SCAN  Never done    There are no preventive care reminders to display for this patient.  Lab Results  Component Value Date   TSH 0.986 03/01/2020   Lab Results  Component Value Date   WBC 6.6 06/04/2020   HGB 12.0 06/04/2020   HCT 36.3 06/04/2020   MCV 95 06/04/2020   PLT 333 06/04/2020   Lab Results  Component Value Date   NA 127 (L) 06/11/2020   K 5.1 06/11/2020   CO2 21 06/11/2020   GLUCOSE 106 (H) 06/11/2020   BUN 15 06/11/2020   CREATININE 0.86 06/11/2020   BILITOT 0.6 03/01/2020   ALKPHOS 92 03/01/2020   AST 20 03/01/2020   ALT 15 03/01/2020   PROT 6.9 03/01/2020   ALBUMIN 4.2 03/01/2020   CALCIUM 9.1 06/11/2020   ANIONGAP 12 07/27/2017   GFR 40.43 (L) 12/18/2014   Lab Results  Component Value Date   CHOL 202 (H) 03/01/2020   Lab Results  Component Value Date   HDL 47 03/01/2020   Lab Results  Component Value Date   LDLCALC 142 (H) 03/01/2020   Lab Results  Component Value Date   TRIG 74 03/01/2020   Lab Results  Component Value Date   CHOLHDL 4.3  03/01/2020   Lab Results  Component Value Date   HGBA1C 5.4 03/01/2020      Assessment & Plan:  1. Recurrent UTI (urinary tract infection) U/a today indicates improvement of urinary tract infection with small WBC only. Referral to urology was made at her most recent visit to address recurrent UTI.   2. Dysuria U/a indicates improvement of recurrent uti.  - POCT Urinalysis Dipstick  3. Dizziness Improvement of symptoms since last visit. Will monitor.   4.  Hyponatremia Sodium level 126 on recent BMP. Likely contributing to symptoms of dizziness and weakness. Has been holding diuretic since most recent results discussed. Will recheck BMP today.  - Basic Metabolic Panel (BMET)   Problem List Items Addressed This Visit      Genitourinary   Recurrent UTI (urinary tract infection) - Primary     Other   Dizziness   Dysuria   Relevant Orders   POCT Urinalysis Dipstick (Completed)   Hyponatremia   Relevant Orders   Basic Metabolic Panel (BMET) (Completed)      Time spent with the patient was approximately 45 minutes. This time included reviewing progress notes, labs, imaging studies, and discussing plan for follow up.   Follow-up: Return for Follow up as scheduled.   Carlean Jews, NP

## 2020-06-12 LAB — BASIC METABOLIC PANEL
BUN/Creatinine Ratio: 17 (ref 12–28)
BUN: 15 mg/dL (ref 8–27)
CO2: 21 mmol/L (ref 20–29)
Calcium: 9.1 mg/dL (ref 8.7–10.3)
Chloride: 92 mmol/L — ABNORMAL LOW (ref 96–106)
Creatinine, Ser: 0.86 mg/dL (ref 0.57–1.00)
Glucose: 106 mg/dL — ABNORMAL HIGH (ref 65–99)
Potassium: 5.1 mmol/L (ref 3.5–5.2)
Sodium: 127 mmol/L — ABNORMAL LOW (ref 134–144)
eGFR: 69 mL/min/{1.73_m2} (ref >59)

## 2020-06-13 DIAGNOSIS — E871 Hypo-osmolality and hyponatremia: Secondary | ICD-10-CM | POA: Insufficient documentation

## 2020-06-14 NOTE — Progress Notes (Signed)
Please let the patient know that her blood work is still showing moderately low sodium level, however, kidney functions have improved back to normal. I would like her to continue holding her hydrochlorothiazide. If she is swelling I can send in a low dose substitute of furosemide to take as needed for swelling in the feet and ankles. I would like to have her BMP checked again one morning next week to make sure that it is getting better. We will need t o schedule her a lab appointment. Thanks.

## 2020-06-16 ENCOUNTER — Other Ambulatory Visit: Payer: Medicare Other

## 2020-06-16 ENCOUNTER — Other Ambulatory Visit: Payer: Self-pay | Admitting: Nurse Practitioner

## 2020-06-16 ENCOUNTER — Other Ambulatory Visit: Payer: Self-pay

## 2020-06-16 DIAGNOSIS — E871 Hypo-osmolality and hyponatremia: Secondary | ICD-10-CM

## 2020-06-17 ENCOUNTER — Other Ambulatory Visit: Payer: Self-pay

## 2020-06-17 ENCOUNTER — Encounter: Payer: Self-pay | Admitting: Cardiovascular Disease

## 2020-06-17 ENCOUNTER — Ambulatory Visit: Payer: Medicare Other | Admitting: Cardiovascular Disease

## 2020-06-17 VITALS — BP 138/78 | HR 78 | Ht 64.0 in | Wt 227.2 lb

## 2020-06-17 DIAGNOSIS — E871 Hypo-osmolality and hyponatremia: Secondary | ICD-10-CM

## 2020-06-17 DIAGNOSIS — Z79899 Other long term (current) drug therapy: Secondary | ICD-10-CM | POA: Diagnosis not present

## 2020-06-17 DIAGNOSIS — I350 Nonrheumatic aortic (valve) stenosis: Secondary | ICD-10-CM

## 2020-06-17 DIAGNOSIS — I10 Essential (primary) hypertension: Secondary | ICD-10-CM

## 2020-06-17 DIAGNOSIS — E782 Mixed hyperlipidemia: Secondary | ICD-10-CM | POA: Diagnosis not present

## 2020-06-17 LAB — BASIC METABOLIC PANEL
BUN/Creatinine Ratio: 21 (ref 12–28)
BUN: 24 mg/dL (ref 8–27)
CO2: 22 mmol/L (ref 20–29)
Calcium: 9.1 mg/dL (ref 8.7–10.3)
Chloride: 98 mmol/L (ref 96–106)
Creatinine, Ser: 1.13 mg/dL — ABNORMAL HIGH (ref 0.57–1.00)
Glucose: 127 mg/dL — ABNORMAL HIGH (ref 65–99)
Potassium: 4.7 mmol/L (ref 3.5–5.2)
Sodium: 137 mmol/L (ref 134–144)
eGFR: 49 mL/min/{1.73_m2} — ABNORMAL LOW (ref >59)

## 2020-06-17 MED ORDER — ROSUVASTATIN CALCIUM 20 MG PO TABS: 20.0000 mg | ORAL_TABLET | Freq: Every day | ORAL | 3 refills | Status: DC

## 2020-06-17 NOTE — Progress Notes (Signed)
Hey. Please let the patient know that her sodium level has returned to normal. If she is experiencing swelling, she can start taking 1/2 tablet of her hydrochlorothiazide if needed. She should follow up in office as scheduled. Thanks.

## 2020-06-17 NOTE — Progress Notes (Signed)
Cardiology Office Note   Date:  06/17/2020   ID:  Tracey Walton, DOB 1940/09/23, MRN [id number]  PCP:  Mayer Masker, PA-C  Cardiologist:   Kristeen Miss, MD   Chief Complaint  Patient presents with  . Aortic Stenosis  . Hypertension  . Hyperlipidemia   Problem List 1. Essential HTN  2. Chest pressure 3. Arthritis 4. Heart murmur 5. Hyperlipidemia 6. Hiatal hernia    Previous notes.  Tracey Walton is a 80 y.o. female who presents for further evaluation of her HTN Has had HTN for many years.  Her medical doctors at the no longer [facility] have had difficulty getting her blood pressure controlled. She has occasional episodes of chest pressure. These chest pain  Tend occur with exertion and are relieved with rest. They last for several minutes. There is no radiation.  She has chronic shortness of breath but the chest pains are not necessarily associated with worsening shortness breath.  She does not get any regular exercise. She tries to avoid eating excessive salt but admits that she still eats occasional salty foods.  She's had hyperlipidemia for the past 7 or 8 years. She is currently on Pravachol.  June 24, 2014:   Tracey Walton presents follow up of her HTN, and CP.  She was scheduled for an echo and myoview  Echo showed: normal LV systolic function. She has mild aortic stenosis, trivial aortic insufficiency, and mild mitral regurgitation.  Left ventricle: The cavity size was normal. Systolic function was normal. The estimated ejection fraction was in the range of 55% to 60%. Wall motion was normal; there were no regional wall motion abnormalities. Doppler parameters are consistent with abnormal left ventricular relaxation (grade 1 diastolic dysfunction). - Aortic valve: Cusp separation was reduced. There was very mild stenosis. There was trivial regurgitation. Mean gradient (S): 11 mm Hg. - Mitral valve: There was mild regurgitation  She had a  [employer] today.  Initial images look good. No evidence of ischemia  Has been limiting the salt in her diet.    June 8.  2016:  [name] is doing well. BP is well controlled at home.   Dec. 9, 2016  Doing well.  BP is normal at home.  Is always elevated at the office.  No CP,  Has some DOE with walking .  Does not walk regularly .   October 01, 2015: Doing well. BP is normal at home.   Always elevated in the doctors office. Does not exercise.   Back pain.   Needs to use a walker if she goes for any distance.   Has pain in her back and hands.   May 14, 2019:  [name] seen today for a follow-up visit.  She has a history of mild aortic stenosis, hypertension, hyperlipidemia. BP at home are better.  She watches her salt  Uses a cane to walk , has some exertional tightness.   Lexiscan Myoview study performed September, 2019 revealed no ischemia.  Her echocardiogram showed some aortic stenosis that perhaps was slightly worse than previous. Some DOE   June 17, 2020: Tracey Walton is seen today for follow-up visit.  She has aortic stenosis, hypertension, hyperlipidemia. Echo from Feb, 2021 showed normal LV function , grade 1 DD, Mild - mod MR , mild AI, mild AS   She has had some episodes of hyponatremia.  Her HCTZ was stopped and since that time she has had some worsening leg edema.  She has chronic leg edema.  She never elevates her legs.  She sleeps in a recliner and walks with a walker or a cane. Past Medical History:  Diagnosis Date  . Anxiety   . Arthritis    "qwhere" (08/09/2017)  . Chest pain 2011   negative for MI  . Chronic kidney disease    renal insufficiency. 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 Chronic lower back pain   . Depression   . Dyspnea    SOB while walking  . GERD (gastroesophageal reflux disease)   . Heart murmur   . History of hiatal hernia   . Hyperlipidemia   . Hypertension   . Osteoporosis   . Pneumonia ~ 2014  .  Polymyalgia (HCC)   . Strep throat     Past Surgical History:  Procedure Laterality Date  . ABDOMINAL HYSTERECTOMY     partial  . BILATERAL CARPAL TUNNEL RELEASE  2003-2005   right-left  . CATARACT EXTRACTION W/ INTRAOCULAR LENS  IMPLANT, BILATERAL Bilateral   . JOINT REPLACEMENT    . REVERSE SHOULDER ARTHROPLASTY Left 10/12/2016   Procedure: REVERSE LEFT SHOULDER ARTHROPLASTY;  Surgeon: Francena Hanly, MD;  Location: [facility] OR;  Service: Orthopedics;  Laterality: Left;  . REVERSE SHOULDER ARTHROPLASTY Right 08/09/2017   Procedure: RIGHT REVERSE SHOULDER ARTHROPLASTY;  Surgeon: Francena Hanly, MD;  Location: [facility] OR;  Service: Orthopedics;  Laterality: Right;  . SHOULDER ARTHROSCOPY Right 2003  . TOOTH EXTRACTION     "1 tooth" (08/09/2017)  . TOTAL KNEE ARTHROPLASTY Bilateral      Current Outpatient Medications  Medication Sig Dispense Refill  . acetaminophen (TYLENOL) 325 MG tablet Take 650 mg by mouth 4 (four) times daily as needed (for pain.).    Marland Kitchen amLODipine (NORVASC) 2.5 MG tablet TAKE 1 TABLET BY MOUTH IN  THE EVENING 90 tablet 3  . carvedilol (COREG) 6.25 MG tablet TAKE 1 TABLET BY MOUTH  TWICE DAILY 180 tablet 1  . cetirizine (ZYRTEC) 10 MG tablet Take 10 mg by mouth daily.    . Coenzyme Q10 100 MG capsule Take 200 mg by mouth daily.    . ferrous sulfate 325 (65 FE) MG tablet Take 1 tablet (325 mg total) by mouth daily with breakfast. Take on Monday, Wednesday, Fridays 90 tablet 1  . FLUoxetine (PROZAC) 20 MG capsule TAKE 1 CAPSULE BY MOUTH  DAILY 90 capsule 0  . fluticasone (FLONASE) 50 MCG/ACT nasal spray Place 2 sprays into both nostrils daily. 48 g 2  . hydrochlorothiazide (HYDRODIURIL) 25 MG tablet TAKE 1 TABLET BY MOUTH  DAILY 90 tablet 3  . HYDROcodone-acetaminophen (NORCO/VICODIN) 5-325 MG tablet Take 1 tablet by mouth every 6 (six) hours as needed for moderate pain or severe pain.     . methocarbamol (ROBAXIN) 500 MG tablet methocarbamol 500 mg tablet  Take 1 tablet twice a  day by oral route as needed.    . naproxen (NAPROSYN) 500 MG tablet TAKE 1 TABLET BY MOUTH  TWICE DAILY WITH A MEAL 180 tablet 0  . nitrofurantoin, macrocrystal-monohydrate, (MACROBID) 100 MG capsule Take 1 capsule (100 mg total) by mouth 2 (two) times daily. 10 capsule 0  . omeprazole (PRILOSEC) 20 MG capsule TAKE 1 CAPSULE BY MOUTH  DAILY 90 capsule 0  . Polyethyl Glycol-Propyl Glycol 0.4-0.3 % SOLN Place 1 drop into both eyes 3 (three) times daily as needed (for dry/irritated eyes.).    Marland Kitchen rosuvastatin (CRESTOR) 20 MG tablet Take 1 tablet (20 mg total)  by mouth daily. 90 tablet 3  . sulfamethoxazole-trimethoprim (BACTRIM DS) 800-160 MG tablet Take 1 tablet by mouth 2 (two) times daily. 20 tablet 0   No current facility-administered medications for this visit.    Allergies:   Penicillins    Social History:  The patient  reports that she has never smoked. She has never used smokeless tobacco. She reports that she does not drink alcohol and does not use drugs.   Family History:  The patient's family history includes Heart attack in her mother; Heart disease in her mother; Heart failure in her father; Hyperlipidemia in her father and sister; Hypertension in her father and sister; Kidney failure in her father; Stroke in her father.    ROS:  Please see the history of present illness.     Physical Exam: Blood pressure 138/78, pulse 78, height 5\' 4"  (1.626 m), weight 227 lb 3.2 oz (103.1 kg), SpO2 95 %.  GEN: Elderly female, no acute distress HEENT: Normal NECK: No JVD; No carotid bruits LYMPHATICS: No lymphadenopathy CARDIAC: RRR , 2/6 systolic murmur  RESPIRATORY:  Clear to auscultation without rales, wheezing or rhonchi  ABDOMEN: Soft, non-tender, non-distended MUSCULOSKELETAL:  1+ bilateral leg edema  SKIN: Warm and dry NEUROLOGIC:  Alert and oriented x 3   EKG: June 17, 2020: Normal sinus rhythm at 78.  Occasional premature atrial contractions.  No ST or T wave changes.  No  changes from previous EKG.  Recent Labs: 03/01/2020: ALT 15; TSH 0.986 06/04/2020: Hemoglobin 12.0; Platelets 333 06/16/2020: BUN 24; Creatinine, Ser 1.13; Potassium 4.7; Sodium 137    Lipid Panel    Component Value Date/Time   CHOL 202 (H) 03/01/2020 0912   TRIG 74 03/01/2020 0912   HDL 47 03/01/2020 0912   CHOLHDL 4.3 03/01/2020 0912   CHOLHDL 3.6 10/01/2015 0746   VLDL 14 10/01/2015 0746   LDLCALC 142 (H) 03/01/2020 0912      Wt Readings from Last 3 Encounters:  06/17/20 227 lb 3.2 oz (103.1 kg)  06/11/20 229 lb 4.8 oz (104 kg)  05/28/20 228 lb 14.4 oz (103.8 kg)      Other studies Reviewed: Additional studies/ records that were reviewed today include: . Review of the above records demonstrates:    ASSESSMENT AND PLAN:  1. Essential HTN  - .  BP is still well controlled.  Cont current meds.   2. Chest pressure-   No recent episodes of chest pressure   3. Aortic stenosis:   Has mild AS and mild AI by echo last year.   Will cont to follow   4. Hyperlipidemia -    Lipids are very elevated on Atorvastatin .  Will change to rosuvastatin 20 mg a day . Check BMP, ALT, lipids in 3 months .   Current medicines are reviewed at length with the patient today.  The patient does not have concerns regarding medicines.  The following changes have been made:  no change   Disposition:    Will see me in 1 year    Signed, 05/30/20, MD  06/17/2020 9:04 AM    [facility] [address] [place], [place], [place]  [place] Phone: [phone]; Fax: [phone]

## 2020-06-17 NOTE — Patient Instructions (Signed)
Medication Instructions:  Your physician has recommended you make the following change in your medication:  Stop Atorvastatin Start Rosuvastatin 20 mg a day   *If you need a refill on your cardiac medications before your next appointment, please call your pharmacy*   Lab Work: BMET, ALT, LIPID... fasting in 3 months  If you have labs (blood work) drawn today and your tests are completely normal, you will receive your results only by: Marland Kitchen MyChart Message (if you have MyChart) OR . A paper copy in the mail If you have any lab test that is abnormal or we need to change your treatment, we will call you to review the results.   Testing/Procedures: none   Follow-Up: At [facility], you and your health needs are our priority.  As part of our continuing mission to provide you with exceptional heart care, we have created designated Provider Care Teams.  These Care Teams include your primary Cardiologist (physician) and Advanced Practice Providers (APPs -  Physician Assistants and Nurse Practitioners) who all work together to provide you with the care you need, when you need it.  We recommend signing up for the patient portal called "MyChart".  Sign up information is provided on this After Visit Summary.  MyChart is used to connect with patients for Virtual Visits (Telemedicine).  Patients are able to view lab/test results, encounter notes, upcoming appointments, etc.  Non-urgent messages can be sent to your provider as well.   To learn more about what you can do with MyChart, go to [email or web address].    Your next appointment:   1 year(s)  The format for your next appointment:   In Person  Provider:   Kristeen Miss, MD   Other Instructions

## 2020-06-30 ENCOUNTER — Ambulatory Visit: Payer: Medicare Other | Admitting: Physician Assistant

## 2020-07-06 ENCOUNTER — Ambulatory Visit: Payer: Medicare Other | Admitting: Physician Assistant

## 2020-07-07 ENCOUNTER — Ambulatory Visit (INDEPENDENT_AMBULATORY_CARE_PROVIDER_SITE_OTHER): Payer: Medicare Other | Admitting: Physician Assistant

## 2020-07-07 ENCOUNTER — Other Ambulatory Visit: Payer: Self-pay

## 2020-07-07 ENCOUNTER — Encounter: Payer: Self-pay | Admitting: Physician Assistant

## 2020-07-07 VITALS — BP 133/76 | HR 70 | Temp 97.2°F | Ht 64.0 in | Wt 232.2 lb

## 2020-07-07 DIAGNOSIS — M48061 Spinal stenosis, lumbar region without neurogenic claudication: Secondary | ICD-10-CM

## 2020-07-07 DIAGNOSIS — N1831 Chronic kidney disease, stage 3a: Secondary | ICD-10-CM | POA: Diagnosis not present

## 2020-07-07 DIAGNOSIS — I10 Essential (primary) hypertension: Secondary | ICD-10-CM | POA: Diagnosis not present

## 2020-07-07 DIAGNOSIS — E782 Mixed hyperlipidemia: Secondary | ICD-10-CM | POA: Diagnosis not present

## 2020-07-07 DIAGNOSIS — F32A Depression, unspecified: Secondary | ICD-10-CM

## 2020-07-07 NOTE — Progress Notes (Signed)
Established Patient Office Visit  Subjective:  Patient ID: Tracey Walton, female    DOB: 1941/01/24  Age: 80 y.o. MRN: [id number]  CC:  Chief Complaint  Patient presents with  . Follow-up  . Hypertension  . Hyperlipidemia    HPI Tracey Walton presents for follow up on hypertension and hyperlipidemia. Reports is doing better and has no acute concerns today. Patient continues to take naproxen for arthritis pain and has reduced to taking night time dose only. Patient is followed by pain management and reports was evaluated by neurosurgery and they recommended conservative treatment for back issues.  HTN: Pt denies chest pain, palpitations, dizziness or headache. States lower extremity edema has been stable. Taking medication as directed without side effects. Checks BP at home and readings average <130/85, reports one reading of 110/65. Feels like she was likely having side effects from the water pill because her muscles don't feel as weak and no longer feels dizzy. Pt monitors sodium and fluid intake.   HLD:  Dr. Elease Hashimoto (cardiologist) recently changed her from atorvastatin to rosuvastatin 20 mg and tolerating medication without issues.  Mood: Patient denies mood changes. Reports was started on fluoxetine for depression several years ago.    Past Medical History:  Diagnosis Date  . Anxiety   . Arthritis    "qwhere" (08/09/2017)  . Chest pain 2011   negative for MI  . Chronic kidney disease    renal insufficiency. 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 Chronic lower back pain   . Depression   . Dyspnea    SOB while walking  . GERD (gastroesophageal reflux disease)   . Heart murmur   . History of hiatal hernia   . Hyperlipidemia   . Hypertension   . Osteoporosis   . Pneumonia ~ 2014  . Polymyalgia (HCC)   . Strep throat     Past Surgical History:  Procedure Laterality Date  . ABDOMINAL HYSTERECTOMY     partial  . BILATERAL CARPAL TUNNEL  RELEASE  2003-2005   right-left  . CATARACT EXTRACTION W/ INTRAOCULAR LENS  IMPLANT, BILATERAL Bilateral   . JOINT REPLACEMENT    . REVERSE SHOULDER ARTHROPLASTY Left 10/12/2016   Procedure: REVERSE LEFT SHOULDER ARTHROPLASTY;  Surgeon: Francena Hanly, MD;  Location: [facility] OR;  Service: Orthopedics;  Laterality: Left;  . REVERSE SHOULDER ARTHROPLASTY Right 08/09/2017   Procedure: RIGHT REVERSE SHOULDER ARTHROPLASTY;  Surgeon: Francena Hanly, MD;  Location: [facility] OR;  Service: Orthopedics;  Laterality: Right;  . SHOULDER ARTHROSCOPY Right 2003  . TOOTH EXTRACTION     "1 tooth" (08/09/2017)  . TOTAL KNEE ARTHROPLASTY Bilateral     Family History  Problem Relation Age of Onset  . Heart attack Mother   . Heart disease Mother   . Hypertension Father   . Heart failure Father   . Kidney failure Father   . Hyperlipidemia Father   . Stroke Father   . Hypertension Sister   . Hyperlipidemia Sister     Social History   Socioeconomic History  . Marital status: Widowed    Spouse name: Not on file  . Number of children: 3  . Years of education: Not on file  . Highest education level: 10th grade  Occupational History  . Not on file  Tobacco Use  . Smoking status: Never Smoker  . Smokeless tobacco: Never Used  Vaping Use  . Vaping Use: Never used  Substance and Sexual Activity  .  Alcohol use: Never  . Drug use: Never  . Sexual activity: Not Currently    Birth control/protection: None  Other Topics Concern  . Not on file  Social History Narrative   Lives with daughter   caffeine not much at all   Social Determinants of Health   Financial Resource Strain: Not on file  Food Insecurity: Not on file  Transportation Needs: Not on file  Physical Activity: Not on file  Stress: Not on file  Social Connections: Not on file  Intimate Partner Violence: Not on file    Outpatient Medications Prior to Visit  Medication Sig Dispense Refill  . acetaminophen (TYLENOL) 325 MG tablet Take 650 mg by  mouth 4 (four) times daily as needed (for pain.).    Marland Kitchen amLODipine (NORVASC) 2.5 MG tablet TAKE 1 TABLET BY MOUTH IN  THE EVENING 90 tablet 3  . carvedilol (COREG) 6.25 MG tablet TAKE 1 TABLET BY MOUTH  TWICE DAILY 180 tablet 1  . cetirizine (ZYRTEC) 10 MG tablet Take 10 mg by mouth daily.    . Coenzyme Q10 100 MG capsule Take 200 mg by mouth daily.    . ferrous sulfate 325 (65 FE) MG tablet Take 1 tablet (325 mg total) by mouth daily with breakfast. Take on Monday, Wednesday, Fridays 90 tablet 1  . FLUoxetine (PROZAC) 20 MG capsule TAKE 1 CAPSULE BY MOUTH  DAILY 90 capsule 0  . fluticasone (FLONASE) 50 MCG/ACT nasal spray Place 2 sprays into both nostrils daily. 48 g 2  . hydrochlorothiazide (HYDRODIURIL) 25 MG tablet TAKE 1 TABLET BY MOUTH  DAILY 90 tablet 3  . HYDROcodone-acetaminophen (NORCO/VICODIN) 5-325 MG tablet Take 1 tablet by mouth every 6 (six) hours as needed for moderate pain or severe pain.     . methocarbamol (ROBAXIN) 500 MG tablet methocarbamol 500 mg tablet  Take 1 tablet twice a day by oral route as needed.    . naproxen (NAPROSYN) 500 MG tablet TAKE 1 TABLET BY MOUTH  TWICE DAILY WITH A MEAL 180 tablet 0  . nitrofurantoin, macrocrystal-monohydrate, (MACROBID) 100 MG capsule Take 1 capsule (100 mg total) by mouth 2 (two) times daily. 10 capsule 0  . omeprazole (PRILOSEC) 20 MG capsule TAKE 1 CAPSULE BY MOUTH  DAILY 90 capsule 0  . Polyethyl Glycol-Propyl Glycol 0.4-0.3 % SOLN Place 1 drop into both eyes 3 (three) times daily as needed (for dry/irritated eyes.).    Marland Kitchen rosuvastatin (CRESTOR) 20 MG tablet Take 1 tablet (20 mg total) by mouth daily. 90 tablet 3  . sulfamethoxazole-trimethoprim (BACTRIM DS) 800-160 MG tablet Take 1 tablet by mouth 2 (two) times daily. (Patient not taking: Reported on 07/07/2020) 20 tablet 0   No facility-administered medications prior to visit.    Allergies  Allergen Reactions  . Penicillins Rash    Pt tolerated 2g ancef without issue 10/12/16     ROS Review of Systems A fourteen system review of systems was performed and found to be positive as per HPI.   Objective:    Physical Exam General:  Well Developed, well nourished, in no acute distress, using cane for assistance  Neuro:  Alert and oriented,  extra-ocular muscles intact  HEENT:  Normocephalic, atraumatic, neck supple Skin:  no gross rash, warm, pink. Cardiac:  RRR, S1 S2 wnl's, +murmur  Respiratory:  ECTA B/L w/o wheezing, Not using accessory muscles, speaking in full sentences- unlabored. Vascular:  Ext warm, no cyanosis apprec.; Trace edema Psych:  No HI/SI, judgement and insight good,  Euthymic mood. Full Affect.   BP 133/76   Pulse 70   Temp (!) 97.2 F (36.2 C)   Ht 5\' 4"  (1.626 m)   Wt 232 lb 3.2 oz (105.3 kg)   SpO2 96%   BMI 39.86 kg/m  Wt Readings from Last 3 Encounters:  07/07/20 232 lb 3.2 oz (105.3 kg)  06/17/20 227 lb 3.2 oz (103.1 kg)  06/11/20 229 lb 4.8 oz (104 kg)     Health Maintenance Due  Topic Date Due  . TETANUS/TDAP  Never done  . DEXA SCAN  Never done    There are no preventive care reminders to display for this patient.  Lab Results  Component Value Date   TSH 0.986 03/01/2020   Lab Results  Component Value Date   WBC 6.6 06/04/2020   HGB 12.0 06/04/2020   HCT 36.3 06/04/2020   MCV 95 06/04/2020   PLT 333 06/04/2020   Lab Results  Component Value Date   NA 137 06/16/2020   K 4.7 06/16/2020   CO2 22 06/16/2020   GLUCOSE 127 (H) 06/16/2020   BUN 24 06/16/2020   CREATININE 1.13 (H) 06/16/2020   BILITOT 0.6 03/01/2020   ALKPHOS 92 03/01/2020   AST 20 03/01/2020   ALT 15 03/01/2020   PROT 6.9 03/01/2020   ALBUMIN 4.2 03/01/2020   CALCIUM 9.1 06/16/2020   ANIONGAP 12 07/27/2017   GFR 40.43 (L) 12/18/2014   Lab Results  Component Value Date   CHOL 202 (H) 03/01/2020   Lab Results  Component Value Date   HDL 47 03/01/2020   Lab Results  Component Value Date   LDLCALC 142 (H) 03/01/2020   Lab  Results  Component Value Date   TRIG 74 03/01/2020   Lab Results  Component Value Date   CHOLHDL 4.3 03/01/2020   Lab Results  Component Value Date   HGBA1C 5.4 03/01/2020      Assessment & Plan:   Problem List Items Addressed This Visit      Cardiovascular and Mediastinum   Hypertension     Other   Hyperlipidemia - Primary   Depression    Other Visit Diagnoses    Stage 3a chronic kidney disease (HCC)       Degenerative lumbar spinal stenosis         Hypertension: -Controlled. -Continue current medication regimen. Advised can take HCTZ 12.5 mg (half tablet of 25 mg) when needed for significant edema.  -Recent BMP stable. -Will continue to monitor.  Hyperlipidemia: -Recommend to continue with rosuvastatin 20 mg. Patient will have repeat lipid panel and hepatic function with cardiology. -Advised to reduce saturated and trans fats. -Will continue to monitor alongside cardiology.  Depression: -PHQ-9 score of 0. Controlled. -Will continue current medication regimen. -Will continue to monitor.  Stage 3a CKD: -Recent Cr 1.13, GFR 49 -Recommend to continue prudent use of naproxen and try to limit use as much as possible. -Will continue to monitor.  Degenerative lumbar spinal stenosis: -Reviewed [facility] Neurosurgery consult. -Recommend to continue to follow up with pain management.   No orders of the defined types were placed in this encounter.   Follow-up: Return in about 4 months (around 11/06/2020) for HTN, HLD, Mood.    01/06/2021, PA-C

## 2020-07-07 NOTE — Patient Instructions (Signed)

## 2020-07-16 ENCOUNTER — Ambulatory Visit: Payer: Medicare Other | Admitting: Cardiovascular Disease

## 2020-07-26 ENCOUNTER — Telehealth: Payer: Self-pay | Admitting: Physician Assistant

## 2020-07-26 DIAGNOSIS — Z96649 Presence of unspecified artificial hip joint: Secondary | ICD-10-CM

## 2020-07-26 NOTE — Telephone Encounter (Signed)
Patient would like a prescription for a walker through medicare. Please advise, thanks.

## 2020-07-26 NOTE — Addendum Note (Signed)
Addended by: Sylvester Harder on: 07/26/2020 11:45 AM   Modules accepted: Orders

## 2020-07-26 NOTE — Telephone Encounter (Signed)
Order has been placed in computer. Advised patient to pick up script in office and take to DME store. DME store information given. AS, CMA

## 2020-07-26 NOTE — Telephone Encounter (Signed)
Patient contacted and made aware. 

## 2020-07-28 ENCOUNTER — Telehealth: Payer: Self-pay | Admitting: Physician Assistant

## 2020-07-28 NOTE — Telephone Encounter (Signed)
Patient would like the prescription for the walker faxed to DME store and the medicare information as well, and a diagnosis number. Fax is [phone] to DME. Please advise, thanks.

## 2020-07-28 NOTE — Telephone Encounter (Signed)
Patient contacted and made aware.            07/26/20 11:46 AM You routed this conversation to Tracey Walton    Me      07/26/20 11:46 AM Note Order has been placed in computer. Advised patient to pick up script in office and take to DME store. DME store information given. AS, CMA     Me      07/26/20 11:45 AM Note Addended by: Nicholes Rough M on: 07/26/2020 11:45 AM     Modules accepted: Orders         BA   07/26/20 11:33 AM Tracey Walton routed this conversation to Me     Tracey Walton   [facility]   07/26/20 11:33 AM Note Patient would like a prescription for a walker through medicare. Please advise, thanks.

## 2020-07-28 NOTE — Telephone Encounter (Signed)
Left voice mail

## 2020-07-30 ENCOUNTER — Telehealth: Payer: Self-pay | Admitting: Physician Assistant

## 2020-07-30 DIAGNOSIS — Z96652 Presence of left artificial knee joint: Secondary | ICD-10-CM

## 2020-07-30 NOTE — Telephone Encounter (Signed)
Please contact patient concerning Rx for walker. Patient is advising that there are errors on the Rx: She has no history of hip replacement, the Rx must reflect the correct Dx for Medicare per [facility]. This should be for knee replacement. Patient is requesting an updated Rx for the DME equipment. Thank you  Please contact Bjorn Loser [phone] to advise next actions. Thank you

## 2020-07-30 NOTE — Telephone Encounter (Signed)
Hip replacement dx changed to Knee Replacement. Spoke with Tracey Walton and she request the DME be faxed to [facility] in [facility] on [address].

## 2020-08-02 NOTE — Telephone Encounter (Signed)
Faxed over

## 2020-08-03 ENCOUNTER — Other Ambulatory Visit: Payer: Self-pay | Admitting: Physician Assistant

## 2020-08-03 DIAGNOSIS — E785 Hyperlipidemia, unspecified: Secondary | ICD-10-CM

## 2020-08-03 DIAGNOSIS — I10 Essential (primary) hypertension: Secondary | ICD-10-CM

## 2020-08-06 ENCOUNTER — Other Ambulatory Visit (INDEPENDENT_AMBULATORY_CARE_PROVIDER_SITE_OTHER): Payer: Medicare Other | Admitting: Physician Assistant

## 2020-08-06 ENCOUNTER — Telehealth: Payer: Self-pay | Admitting: Physician Assistant

## 2020-08-06 ENCOUNTER — Other Ambulatory Visit: Payer: Self-pay

## 2020-08-06 DIAGNOSIS — N390 Urinary tract infection, site not specified: Secondary | ICD-10-CM

## 2020-08-06 DIAGNOSIS — R350 Frequency of micturition: Secondary | ICD-10-CM | POA: Diagnosis not present

## 2020-08-06 DIAGNOSIS — R109 Unspecified abdominal pain: Secondary | ICD-10-CM

## 2020-08-06 DIAGNOSIS — R112 Nausea with vomiting, unspecified: Secondary | ICD-10-CM | POA: Diagnosis not present

## 2020-08-06 DIAGNOSIS — R32 Unspecified urinary incontinence: Secondary | ICD-10-CM

## 2020-08-06 LAB — POCT URINALYSIS DIPSTICK
Glucose, UA: POSITIVE — AB
Nitrite, UA: POSITIVE
Protein, UA: POSITIVE — AB
Spec Grav, UA: 1.02 (ref 1.010–1.025)
Urobilinogen, UA: 4 E.U./dL — AB
pH, UA: 5 (ref 5.0–8.0)

## 2020-08-06 MED ORDER — CEFTRIAXONE SODIUM 1 G IJ SOLR
1.0000 g | Freq: Once | INTRAMUSCULAR | Status: AC
  Administered 2020-08-06: 1 g via INTRAMUSCULAR

## 2020-08-06 MED ORDER — CIPROFLOXACIN HCL 500 MG PO TABS: 500.0000 mg | ORAL_TABLET | Freq: Two times a day (BID) | ORAL | 0 refills | Status: DC

## 2020-08-06 MED ORDER — ONDANSETRON HCL 4 MG PO TABS: 4.0000 mg | ORAL_TABLET | Freq: Three times a day (TID) | ORAL | 0 refills | Status: DC | PRN

## 2020-08-06 NOTE — Telephone Encounter (Signed)
Patient thinks she might have a UTI.   Pt scheduled for NV today.

## 2020-08-06 NOTE — Addendum Note (Signed)
Addended by: Sylvester Harder on: 08/06/2020 10:32 AM   Modules accepted: Orders

## 2020-08-09 ENCOUNTER — Telehealth: Payer: Self-pay | Admitting: Physician Assistant

## 2020-08-09 NOTE — Telephone Encounter (Signed)
Opened in error. MA

## 2020-08-11 ENCOUNTER — Other Ambulatory Visit: Payer: Medicare Other

## 2020-08-11 LAB — URINE CULTURE

## 2020-08-12 ENCOUNTER — Other Ambulatory Visit: Payer: Medicare Other

## 2020-08-13 ENCOUNTER — Other Ambulatory Visit: Payer: Self-pay | Admitting: Physician Assistant

## 2020-08-13 DIAGNOSIS — N390 Urinary tract infection, site not specified: Secondary | ICD-10-CM

## 2020-08-13 DIAGNOSIS — N1831 Chronic kidney disease, stage 3a: Secondary | ICD-10-CM

## 2020-08-15 ENCOUNTER — Other Ambulatory Visit: Payer: Self-pay | Admitting: Physician Assistant

## 2020-08-18 ENCOUNTER — Other Ambulatory Visit (INDEPENDENT_AMBULATORY_CARE_PROVIDER_SITE_OTHER): Payer: Medicare Other

## 2020-08-18 ENCOUNTER — Other Ambulatory Visit: Payer: Self-pay

## 2020-08-18 DIAGNOSIS — N390 Urinary tract infection, site not specified: Secondary | ICD-10-CM | POA: Diagnosis not present

## 2020-08-18 DIAGNOSIS — R300 Dysuria: Secondary | ICD-10-CM | POA: Diagnosis not present

## 2020-08-18 DIAGNOSIS — R319 Hematuria, unspecified: Secondary | ICD-10-CM | POA: Diagnosis not present

## 2020-08-18 LAB — POCT URINALYSIS DIPSTICK
Bilirubin, UA: NEGATIVE
Blood, UA: NEGATIVE
Glucose, UA: NEGATIVE
Ketones, UA: NEGATIVE
Nitrite, UA: NEGATIVE
Protein, UA: NEGATIVE
Spec Grav, UA: 1.015 (ref 1.010–1.025)
Urobilinogen, UA: 0.2 E.U./dL
pH, UA: 5.5 (ref 5.0–8.0)

## 2020-08-18 NOTE — Addendum Note (Signed)
Addended by: Lupita Leash on: 08/18/2020 11:23 AM   Modules accepted: Orders

## 2020-08-18 NOTE — Addendum Note (Signed)
Addended by: Lupita Leash on: 08/18/2020 10:33 AM   Modules accepted: Orders

## 2020-08-18 NOTE — Addendum Note (Signed)
Addended by: Lupita Leash on: 08/18/2020 10:32 AM   Modules accepted: Orders

## 2020-08-18 NOTE — Addendum Note (Signed)
Addended by: Lupita Leash on: 08/18/2020 10:31 AM   Modules accepted: Orders

## 2020-08-23 LAB — URINE CULTURE

## 2020-08-25 ENCOUNTER — Other Ambulatory Visit: Payer: Self-pay

## 2020-08-25 ENCOUNTER — Ambulatory Visit (INDEPENDENT_AMBULATORY_CARE_PROVIDER_SITE_OTHER): Payer: Medicare Other | Admitting: Physician Assistant

## 2020-08-25 VITALS — BP 167/82 | HR 66 | Temp 96.8°F | Ht 64.0 in | Wt 232.3 lb

## 2020-08-25 DIAGNOSIS — N390 Urinary tract infection, site not specified: Secondary | ICD-10-CM | POA: Diagnosis not present

## 2020-08-25 MED ORDER — CEFTRIAXONE SODIUM 1 G IJ SOLR
1.0000 g | Freq: Once | INTRAMUSCULAR | Status: AC
  Administered 2020-08-25: 1 g via INTRAMUSCULAR

## 2020-08-25 NOTE — Progress Notes (Signed)
1g of rocephin given per [facility] in left glute. Pt. tolerated injection well with no complication.

## 2020-09-03 ENCOUNTER — Other Ambulatory Visit: Payer: Self-pay | Admitting: Physician Assistant

## 2020-09-03 ENCOUNTER — Ambulatory Visit (INDEPENDENT_AMBULATORY_CARE_PROVIDER_SITE_OTHER): Payer: Medicare Other | Admitting: Physician Assistant

## 2020-09-03 ENCOUNTER — Telehealth: Payer: Self-pay | Admitting: Physician Assistant

## 2020-09-03 ENCOUNTER — Other Ambulatory Visit: Payer: Self-pay

## 2020-09-03 VITALS — Temp 98.3°F

## 2020-09-03 DIAGNOSIS — R319 Hematuria, unspecified: Secondary | ICD-10-CM | POA: Diagnosis not present

## 2020-09-03 DIAGNOSIS — N1831 Chronic kidney disease, stage 3a: Secondary | ICD-10-CM

## 2020-09-03 DIAGNOSIS — N390 Urinary tract infection, site not specified: Secondary | ICD-10-CM

## 2020-09-03 LAB — POCT URINALYSIS DIPSTICK
Bilirubin, UA: NEGATIVE
Blood, UA: NEGATIVE
Glucose, UA: NEGATIVE
Ketones, UA: NEGATIVE
Nitrite, UA: NEGATIVE
Protein, UA: NEGATIVE
Spec Grav, UA: 1.01 (ref 1.010–1.025)
Urobilinogen, UA: 1 E.U./dL
pH, UA: 5.5 (ref 5.0–8.0)

## 2020-09-03 NOTE — Progress Notes (Signed)
Patient came in to provide a urine sample for frequent UTIs. She reports having burning sensation when urinating and complaining of urination frequency. Standing up also causes her to urinate. -WA, CMA  Agree with above. Sent for urine culture. Pending results will start antibiotic therapy if indicated. Mayer Masker, PA-C

## 2020-09-03 NOTE — Telephone Encounter (Signed)
Patient scheduled.

## 2020-09-03 NOTE — Telephone Encounter (Signed)
Patient states she needs more antibiotics for her UTI. Please advise, thanks.

## 2020-09-03 NOTE — Telephone Encounter (Signed)
Please schedule pt for today NV urine culture

## 2020-09-05 LAB — URINE CULTURE

## 2020-09-10 ENCOUNTER — Other Ambulatory Visit: Payer: Self-pay

## 2020-09-10 ENCOUNTER — Other Ambulatory Visit: Payer: Medicare Other | Admitting: *Deleted

## 2020-09-10 DIAGNOSIS — E782 Mixed hyperlipidemia: Secondary | ICD-10-CM

## 2020-09-10 DIAGNOSIS — I350 Nonrheumatic aortic (valve) stenosis: Secondary | ICD-10-CM

## 2020-09-10 DIAGNOSIS — Z79899 Other long term (current) drug therapy: Secondary | ICD-10-CM

## 2020-09-10 LAB — ALT: ALT: 11 IU/L (ref 0–32)

## 2020-09-10 LAB — BASIC METABOLIC PANEL
BUN/Creatinine Ratio: 24 (ref 12–28)
BUN: 24 mg/dL (ref 8–27)
CO2: 24 mmol/L (ref 20–29)
Calcium: 9.2 mg/dL (ref 8.7–10.3)
Chloride: 100 mmol/L (ref 96–106)
Creatinine, Ser: 0.99 mg/dL (ref 0.57–1.00)
Glucose: 88 mg/dL (ref 65–99)
Potassium: 4.6 mmol/L (ref 3.5–5.2)
Sodium: 138 mmol/L (ref 134–144)
eGFR: 58 mL/min/{1.73_m2} — ABNORMAL LOW (ref >59)

## 2020-09-10 LAB — LIPID PANEL
Chol/HDL Ratio: 3.5 ratio (ref 0.0–4.4)
Cholesterol, Total: 146 mg/dL (ref 100–199)
HDL: 42 mg/dL (ref >39)
LDL Chol Calc (NIH): 92 mg/dL (ref 0–99)
Triglycerides: 56 mg/dL (ref 0–149)
VLDL Cholesterol Cal: 12 mg/dL (ref 5–40)

## 2020-10-15 ENCOUNTER — Encounter: Payer: Self-pay | Admitting: Nurse Practitioner

## 2020-10-15 ENCOUNTER — Ambulatory Visit (INDEPENDENT_AMBULATORY_CARE_PROVIDER_SITE_OTHER): Payer: Medicare Other | Admitting: Nurse Practitioner

## 2020-10-15 ENCOUNTER — Telehealth: Payer: Self-pay | Admitting: Physician Assistant

## 2020-10-15 ENCOUNTER — Other Ambulatory Visit: Payer: Self-pay

## 2020-10-15 VITALS — BP 168/68 | HR 68 | Temp 98.6°F | Ht 64.0 in | Wt 228.0 lb

## 2020-10-15 DIAGNOSIS — L03119 Cellulitis of unspecified part of limb: Secondary | ICD-10-CM

## 2020-10-15 DIAGNOSIS — L02419 Cutaneous abscess of limb, unspecified: Secondary | ICD-10-CM

## 2020-10-15 DIAGNOSIS — R600 Localized edema: Secondary | ICD-10-CM

## 2020-10-15 DIAGNOSIS — I10 Essential (primary) hypertension: Secondary | ICD-10-CM | POA: Diagnosis not present

## 2020-10-15 MED ORDER — SULFAMETHOXAZOLE-TRIMETHOPRIM 800-160 MG PO TABS: 1.0000 | ORAL_TABLET | Freq: Two times a day (BID) | ORAL | 0 refills | Status: DC

## 2020-10-15 NOTE — Progress Notes (Signed)
Established Patient Office Visit  Subjective:  Patient ID: Tracey Walton, female    DOB: [birth year]  Age: 80 y.o. MRN: [id number]  CC:  Chief Complaint  Patient presents with   Leg Swelling    HPI Tracey Walton presents for evaluation of redness and swelling of bilateral lower legs.  Has been present for some time and gradually getting worse.  She states there is a small open sore on the right lower extremity which is tender to palpate.  She has not noted drainage at this time.  Both legs are tender.  She denies fever, chills, body aches, or muscle pain. She denies other concerns or complaints at this time.  She denies chest pain, chest pressure, or shortness of breath. He denies headaches or visual disturbances. He denies abdominal pain, nausea, vomiting, or changes in bowel or bladder habits.    Past Medical History:  Diagnosis Date   Anxiety    Arthritis    "qwhere" (08/09/2017)   Chest pain 2011   negative for MI   Chronic kidney disease    renal insufficiency. 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 down after her knee surgery back in 2008   Chronic lower back pain    Depression    Dyspnea    SOB while walking   GERD (gastroesophageal reflux disease)    Heart murmur    History of hiatal hernia    Hyperlipidemia    Hypertension    Osteoporosis    Pneumonia ~ 2014   Polymyalgia ([place])    Strep throat     Past Surgical History:  Procedure Laterality Date   ABDOMINAL HYSTERECTOMY     partial   BILATERAL CARPAL TUNNEL RELEASE  2003-2005   right-left   CATARACT EXTRACTION W/ INTRAOCULAR LENS  IMPLANT, BILATERAL Bilateral    JOINT REPLACEMENT     REVERSE SHOULDER ARTHROPLASTY Left 10/12/2016   Procedure: REVERSE LEFT SHOULDER ARTHROPLASTY;  Surgeon: Justice Britain, MD;  Location: [facility];  Service: Orthopedics;  Laterality: Left;   REVERSE SHOULDER ARTHROPLASTY Right 08/09/2017   Procedure: RIGHT REVERSE SHOULDER ARTHROPLASTY;  Surgeon: Justice Britain, MD;  Location: [facility];  Service:  Orthopedics;  Laterality: Right;   SHOULDER ARTHROSCOPY Right 2003   TOOTH EXTRACTION     "1 tooth" (08/09/2017)   TOTAL KNEE ARTHROPLASTY Bilateral     Family History  Problem Relation Age of Onset   Heart attack Mother    Heart disease Mother    Hypertension Father    Heart failure Father    Kidney failure Father    Hyperlipidemia Father    Stroke Father    Hypertension Sister    Hyperlipidemia Sister     Social History   Socioeconomic History   Marital status: Widowed    Spouse name: Not on file   Number of children: 3   Years of education: Not on file   Highest education level: 10th grade  Occupational History   Not on file  Tobacco Use   Smoking status: Never   Smokeless tobacco: Never  Vaping Use   Vaping Use: Never used  Substance and Sexual Activity   Alcohol use: Never   Drug use: Never   Sexual activity: Not Currently    Birth control/protection: None  Other Topics Concern   Not on file  Social History Narrative   Lives with daughter   caffeine not much at all   Social Determinants of Health   Financial Resource Strain: Not on file  Food Insecurity: Not on file  Transportation Needs: Not on file  Physical Activity: Not on file  Stress: Not on file  Social Connections: Not on file  Intimate Partner Violence: Not on file    Outpatient Medications Prior to Visit  Medication Sig Dispense Refill   acetaminophen (TYLENOL) 325 MG tablet Take 650 mg by mouth 4 (four) times daily as needed (for pain.).     amLODipine (NORVASC) 2.5 MG tablet TAKE 1 TABLET BY MOUTH IN  THE EVENING 90 tablet 3   Baclofen 5 MG TABS Take 1 tablet by mouth 2 (two) times daily as needed.     carvedilol (COREG) 6.25 MG tablet TAKE 1 TABLET BY MOUTH  TWICE DAILY 180 tablet 1   cetirizine (ZYRTEC) 10 MG tablet Take 10 mg by mouth daily.     Coenzyme Q10 100 MG capsule Take 200 mg by mouth daily.     ferrous sulfate 325 (65 FE) MG tablet Take 1 tablet (325 mg total) by mouth daily  with breakfast. Take on Monday, Wednesday, Fridays 90 tablet 1   FLUoxetine (PROZAC) 20 MG capsule TAKE 1 CAPSULE BY MOUTH  DAILY 90 capsule 0   fluticasone (FLONASE) 50 MCG/ACT nasal spray Place 2 sprays into both nostrils daily. 48 g 2   gabapentin (NEURONTIN) 100 MG capsule gabapentin 100 mg capsule  Take 1 capsule twice a day by oral route.  May cause drowsiness     hydrochlorothiazide (HYDRODIURIL) 25 MG tablet TAKE 1 TABLET BY MOUTH  DAILY 90 tablet 3   HYDROcodone-acetaminophen (NORCO/VICODIN) 5-325 MG tablet Take 1 tablet by mouth every 6 (six) hours as needed for moderate pain or severe pain.      methocarbamol (ROBAXIN) 500 MG tablet methocarbamol 500 mg tablet  Take 1 tablet twice a day by oral route as needed.     ondansetron (ZOFRAN) 4 MG tablet Take 1 tablet (4 mg total) by mouth every 8 (eight) hours as needed for nausea or vomiting. 20 tablet 0   Polyethyl Glycol-Propyl Glycol 0.4-0.3 % SOLN Place 1 drop into both eyes 3 (three) times daily as needed (for dry/irritated eyes.).     rosuvastatin (CRESTOR) 20 MG tablet Take 1 tablet (20 mg total) by mouth daily. 90 tablet 3   ciprofloxacin (CIPRO) 500 MG tablet Take 1 tablet (500 mg total) by mouth 2 (two) times daily. 14 tablet 0   naproxen (NAPROSYN) 500 MG tablet TAKE 1 TABLET BY MOUTH  TWICE DAILY WITH A MEAL 180 tablet 0   nitrofurantoin, macrocrystal-monohydrate, (MACROBID) 100 MG capsule Take 1 capsule (100 mg total) by mouth 2 (two) times daily. 10 capsule 0   omeprazole (PRILOSEC) 20 MG capsule TAKE 1 CAPSULE BY MOUTH  DAILY 90 capsule 0   No facility-administered medications prior to visit.    Allergies  Allergen Reactions   Penicillins Rash    Pt tolerated 2g ancef without issue 10/12/16    ROS Review of Systems  Constitutional:  Positive for activity change and fatigue. Negative for chills and fever.  HENT:  Negative for congestion, postnasal drip, rhinorrhea, sinus pressure and sinus pain.   Eyes: Negative.    Respiratory:  Negative for cough, chest tightness, shortness of breath and wheezing.   Cardiovascular:  Positive for leg swelling. Negative for chest pain and palpitations.       Bilateral lower leg swelling.  Gastrointestinal:  Negative for constipation, diarrhea, nausea and vomiting.  Endocrine: Negative for cold intolerance, heat intolerance, polydipsia and polyuria.  Musculoskeletal:  Negative for arthralgias, back pain and myalgias.  Skin:  Negative for rash.       Has noted a small, round sore on the side of her right lower leg.  It is tender.  Both lower legs are swollen.  Allergic/Immunologic: Negative.   Neurological:  Negative for dizziness, weakness and headaches.  Hematological: Negative.   Psychiatric/Behavioral:  Negative for dysphoric mood. The patient is not nervous/anxious.      Objective:    Physical Exam Vitals and nursing note reviewed.  Constitutional:      Appearance: Normal appearance. She is well-developed. She is obese.  HENT:     Head: Normocephalic and atraumatic.     Nose: Nose normal.     Mouth/Throat:     Mouth: Mucous membranes are moist.  Eyes:     Extraocular Movements: Extraocular movements intact.     Conjunctiva/sclera: Conjunctivae normal.     Pupils: Pupils are equal, round, and reactive to light.  Cardiovascular:     Rate and Rhythm: Normal rate and regular rhythm.     Pulses: Normal pulses.     Heart sounds: Normal heart sounds.     Comments: Nonpitting edema present in bilateral lower extremities. Pulmonary:     Effort: Pulmonary effort is normal.     Breath sounds: Normal breath sounds. No wheezing or rhonchi.  Abdominal:     Palpations: Abdomen is soft.  Musculoskeletal:        General: Normal range of motion.     Cervical back: Normal range of motion and neck supple.  Lymphadenopathy:     Cervical: No cervical adenopathy.  Skin:    General: Skin is warm and dry.     Capillary Refill: Capillary refill takes less than 2  seconds.     Findings: Erythema present.     Comments: Bilateral lower legs are swollen, tender, and warm to touch.  There is small, round open sore to right lower leg.  There is scabbing present.  Small amount of serosanguineous fluid draining from lesion.  There is localized erythema.  Neurological:     General: No focal deficit present.     Mental Status: She is alert and oriented to person, place, and time. Mental status is at baseline.  Psychiatric:        Mood and Affect: Mood normal.        Behavior: Behavior normal.        Thought Content: Thought content normal.        Judgment: Judgment normal.    Today's Vitals   10/15/20 1056  BP: (!) 168/68  Pulse: 68  Temp: 98.6 F (37 C)  SpO2: 96%  Weight: 228 lb (103.4 kg)  Height: 5' 4"  (1.626 m)   Body mass index is 39.14 kg/m.   Wt Readings from Last 3 Encounters:  10/27/20 215 lb (97.5 kg)  10/15/20 228 lb (103.4 kg)  08/25/20 232 lb 4.8 oz (105.4 kg)     Health Maintenance Due  Topic Date Due   TETANUS/TDAP  Never done   Zoster Vaccines- Shingrix (1 of 2) Never done   DEXA SCAN  Never done   COVID-19 Vaccine (4 - Booster for Moderna series) 07/05/2020    There are no preventive care reminders to display for this patient.  Lab Results  Component Value Date   TSH 0.986 03/01/2020   Lab Results  Component Value Date   WBC 6.6 06/04/2020   HGB 12.0 06/04/2020   HCT  36.3 06/04/2020   MCV 95 06/04/2020   PLT 333 06/04/2020   Lab Results  Component Value Date   NA 138 09/10/2020   K 4.6 09/10/2020   CO2 24 09/10/2020   GLUCOSE 88 09/10/2020   BUN 24 09/10/2020   CREATININE 0.99 09/10/2020   BILITOT 0.6 03/01/2020   ALKPHOS 92 03/01/2020   AST 20 03/01/2020   ALT 11 09/10/2020   PROT 6.9 03/01/2020   ALBUMIN 4.2 03/01/2020   CALCIUM 9.2 09/10/2020   ANIONGAP 12 07/27/2017   EGFR 58 (L) 09/10/2020   GFR 40.43 (L) 12/18/2014   Lab Results  Component Value Date   CHOL 146 09/10/2020   Lab  Results  Component Value Date   HDL 42 09/10/2020   Lab Results  Component Value Date   LDLCALC 92 09/10/2020   Lab Results  Component Value Date   TRIG 56 09/10/2020   Lab Results  Component Value Date   CHOLHDL 3.5 09/10/2020   Lab Results  Component Value Date   HGBA1C 5.4 03/01/2020      Assessment & Plan:  1. Cellulitis and abscess of lower extremity Start Bactrim DS twice daily for next 10 days.  Advised her to keep wound of right lower extremity clean.  Should wash with warm soap and water twice daily and pat dry with warm cloth.  May keep covered or uncovered.  Follow-up in 2 weeks for further evaluation. - sulfamethoxazole-trimethoprim (BACTRIM DS) 800-160 MG tablet; Take 1 tablet by mouth 2 (two) times daily.  Dispense: 20 tablet; Refill: 0  2. Bilateral lower extremity edema Restart hydrochlorothiazide at 12.5 mg tablets.  Patient currently has hydrochlorothiazide 25 mg tablets at home.  Advised her to cut in half and take daily.  Increase water intake.  Elevate both legs when possible.  3. Essential hypertension Restart hydrochlorothiazide 12.5 mg tablets.  Encouraged her to limit sodium intake and increase p.o. water in her diet.  Recheck in 2 weeks.  Problem List Items Addressed This Visit       Cardiovascular and Mediastinum   Essential hypertension     Other   Cellulitis and abscess of lower extremity - Primary   Relevant Medications   sulfamethoxazole-trimethoprim (BACTRIM DS) 800-160 MG tablet   Bilateral lower extremity edema    Meds ordered this encounter  Medications   sulfamethoxazole-trimethoprim (BACTRIM DS) 800-160 MG tablet    Sig: Take 1 tablet by mouth 2 (two) times daily.    Dispense:  20 tablet    Refill:  0    Order Specific Question:   Supervising Provider    Answer:   Beatrice Lecher D [[id number]]    Follow-up: Return in about 2 weeks (around 10/29/2020) for cellulitis/htn.   This note was dictated using TEFL teacher. Rapid proofreading was performed to expedite the delivery of the information. Despite proofreading, phonetic errors will occur which are common with this voice recognition software. Please take this into consideration. If there are any concerns, please contact our office.    Ronnell Freshwater, NP

## 2020-10-15 NOTE — Telephone Encounter (Signed)
Patient has a sore on her right leg and it is swollen (both legs she states), please advise. Thanks

## 2020-10-15 NOTE — Telephone Encounter (Signed)
Patient added to schedule for today. AS, CMA

## 2020-10-27 ENCOUNTER — Encounter: Payer: Self-pay | Admitting: Physician Assistant

## 2020-10-27 ENCOUNTER — Other Ambulatory Visit: Payer: Self-pay | Admitting: Physician Assistant

## 2020-10-27 ENCOUNTER — Other Ambulatory Visit: Payer: Self-pay

## 2020-10-27 ENCOUNTER — Ambulatory Visit (INDEPENDENT_AMBULATORY_CARE_PROVIDER_SITE_OTHER): Payer: Medicare Other | Admitting: Physician Assistant

## 2020-10-27 VITALS — BP 132/74 | HR 78 | Temp 98.1°F | Ht 64.0 in | Wt 215.0 lb

## 2020-10-27 DIAGNOSIS — W19XXXA Unspecified fall, initial encounter: Secondary | ICD-10-CM | POA: Diagnosis not present

## 2020-10-27 DIAGNOSIS — L02419 Cutaneous abscess of limb, unspecified: Secondary | ICD-10-CM | POA: Diagnosis not present

## 2020-10-27 DIAGNOSIS — H9202 Otalgia, left ear: Secondary | ICD-10-CM

## 2020-10-27 DIAGNOSIS — L03119 Cellulitis of unspecified part of limb: Secondary | ICD-10-CM

## 2020-10-27 DIAGNOSIS — E785 Hyperlipidemia, unspecified: Secondary | ICD-10-CM

## 2020-10-27 DIAGNOSIS — I10 Essential (primary) hypertension: Secondary | ICD-10-CM

## 2020-10-27 NOTE — Progress Notes (Signed)
Established Patient Office Visit  Subjective:  Patient ID: Tracey Walton, female    DOB: 1940-09-08  Age: 80 y.o. MRN: [id number]  CC:  Chief Complaint  Patient presents with   Follow-up   Hypertension   Cellulitis    HPI [name] presents for follow up on cellulitis. Patient took last dose of antibiotic today. States lower extremity pain and redness has improved. Also has c/o sore underneath her tongue which appeared after starting Bactrim. Reports left ear pain which started last night. Denies fever, otorrhea, or tinnitus. Patient fell Monday in her [employer], states lost her balance when turning. Does report hitting her head slightly into the wall. Denies LOC, altered mental status, headache, vision changes, or syncope.   Past Medical History:  Diagnosis Date   Anxiety    Arthritis    "qwhere" (08/09/2017)   Chest pain 2011   negative for MI   Chronic kidney disease    renal insufficiency. 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 down after her knee surgery back in 2008   Chronic lower back pain    Depression    Dyspnea    SOB while walking   GERD (gastroesophageal reflux disease)    Heart murmur    History of hiatal hernia    Hyperlipidemia    Hypertension    Osteoporosis    Pneumonia ~ 2014   Polymyalgia ([place])    Strep throat     Past Surgical History:  Procedure Laterality Date   ABDOMINAL HYSTERECTOMY     partial   BILATERAL CARPAL TUNNEL RELEASE  2003-2005   right-left   CATARACT EXTRACTION W/ INTRAOCULAR LENS  IMPLANT, BILATERAL Bilateral    JOINT REPLACEMENT     REVERSE SHOULDER ARTHROPLASTY Left 10/12/2016   Procedure: REVERSE LEFT SHOULDER ARTHROPLASTY;  Surgeon: Justice Britain, MD;  Location: [facility];  Service: Orthopedics;  Laterality: Left;   REVERSE SHOULDER ARTHROPLASTY Right 08/09/2017   Procedure: RIGHT REVERSE SHOULDER ARTHROPLASTY;  Surgeon: Justice Britain, MD;  Location: [facility];  Service: Orthopedics;  Laterality: Right;   SHOULDER ARTHROSCOPY Right 2003    TOOTH EXTRACTION     "1 tooth" (08/09/2017)   TOTAL KNEE ARTHROPLASTY Bilateral     Family History  Problem Relation Age of Onset   Heart attack Mother    Heart disease Mother    Hypertension Father    Heart failure Father    Kidney failure Father    Hyperlipidemia Father    Stroke Father    Hypertension Sister    Hyperlipidemia Sister     Social History   Socioeconomic History   Marital status: Widowed    Spouse name: Not on file   Number of children: 3   Years of education: Not on file   Highest education level: 10th grade  Occupational History   Not on file  Tobacco Use   Smoking status: Never   Smokeless tobacco: Never  Vaping Use   Vaping Use: Never used  Substance and Sexual Activity   Alcohol use: Never   Drug use: Never   Sexual activity: Not Currently    Birth control/protection: None  Other Topics Concern   Not on file  Social History Narrative   Lives with daughter   caffeine not much at all   Social Determinants of Health   Financial Resource Strain: Not on file  Food Insecurity: Not on file  Transportation Needs: Not on file  Physical Activity: Not on file  Stress: Not on file  Social Connections: Not on file  Intimate Partner Violence: Not on file    Outpatient Medications Prior to Visit  Medication Sig Dispense Refill   acetaminophen (TYLENOL) 325 MG tablet Take 650 mg by mouth 4 (four) times daily as needed (for pain.).     amLODipine (NORVASC) 2.5 MG tablet TAKE 1 TABLET BY MOUTH IN  THE EVENING 90 tablet 3   Baclofen 5 MG TABS Take 1 tablet by mouth 2 (two) times daily as needed.     carvedilol (COREG) 6.25 MG tablet TAKE 1 TABLET BY MOUTH  TWICE DAILY 180 tablet 1   cetirizine (ZYRTEC) 10 MG tablet Take 10 mg by mouth daily.     Coenzyme Q10 100 MG capsule Take 200 mg by mouth daily.     ferrous sulfate 325 (65 FE) MG tablet Take 1 tablet (325 mg total) by mouth daily with breakfast. Take on Monday, Wednesday, Fridays 90 tablet 1    FLUoxetine (PROZAC) 20 MG capsule TAKE 1 CAPSULE BY MOUTH  DAILY 90 capsule 0   fluticasone (FLONASE) 50 MCG/ACT nasal spray Place 2 sprays into both nostrils daily. 48 g 2   gabapentin (NEURONTIN) 100 MG capsule gabapentin 100 mg capsule  Take 1 capsule twice a day by oral route.  May cause drowsiness     hydrochlorothiazide (HYDRODIURIL) 25 MG tablet TAKE 1 TABLET BY MOUTH  DAILY 90 tablet 3   HYDROcodone-acetaminophen (NORCO/VICODIN) 5-325 MG tablet Take 1 tablet by mouth every 6 (six) hours as needed for moderate pain or severe pain.      methocarbamol (ROBAXIN) 500 MG tablet methocarbamol 500 mg tablet  Take 1 tablet twice a day by oral route as needed.     naproxen (NAPROSYN) 500 MG tablet TAKE 1 TABLET BY MOUTH  TWICE DAILY WITH A MEAL 180 tablet 0   omeprazole (PRILOSEC) 20 MG capsule TAKE 1 CAPSULE BY MOUTH  DAILY 90 capsule 0   ondansetron (ZOFRAN) 4 MG tablet Take 1 tablet (4 mg total) by mouth every 8 (eight) hours as needed for nausea or vomiting. 20 tablet 0   Polyethyl Glycol-Propyl Glycol 0.4-0.3 % SOLN Place 1 drop into both eyes 3 (three) times daily as needed (for dry/irritated eyes.).     rosuvastatin (CRESTOR) 20 MG tablet Take 1 tablet (20 mg total) by mouth daily. 90 tablet 3   sulfamethoxazole-trimethoprim (BACTRIM DS) 800-160 MG tablet Take 1 tablet by mouth 2 (two) times daily. 20 tablet 0   Baclofen 5 MG TABS baclofen 5 mg tablet  Take 1 tablet twice a day by oral route as needed.     No facility-administered medications prior to visit.    Allergies  Allergen Reactions   Penicillins Rash    Pt tolerated 2g ancef without issue 10/12/16    ROS Review of Systems A fourteen system review of systems was performed and found to be positive as per HPI.    Objective:    Physical Exam General:  Well Developed, well nourished, appropriate for stated age.  Neuro:  Alert and oriented,  extra-ocular muscles intact, CN II-XII grossly intact  HEENT:  Normocephalic,  atraumatic, PERRL, normal conjunctiva, normal TM's of both ears with some fluid in left ear, ulcer noted underneath tongue left lateral side, no adenopathy Skin: Healing ulcer with scab formation of right lower extremity without erythema or warmth Cardiac:  RRR, S1 S2, +murmur Respiratory:  ECTA B/L , Not using accessory muscles, speaking in full sentences- unlabored. Vascular:  Ext warm, no  cyanosis apprec.; 1+ pitting edema of both LE Psych:  No HI/SI, judgement and insight good, Euthymic mood. Full Affect.  BP 132/74   Pulse 78   Temp 98.1 F (36.7 C)   Ht _0  (1.626 m)   Wt 215 lb (97.5 kg)   SpO2 96%   BMI 36.90 kg/m  Wt Readings from Last 3 Encounters:  10/27/20 215 lb (97.5 kg)  10/15/20 228 lb (103.4 kg)  08/25/20 232 lb 4.8 oz (105.4 kg)     Health Maintenance Due  Topic Date Due   TETANUS/TDAP  Never done   Zoster Vaccines- Shingrix (1 of 2) Never done   DEXA SCAN  Never done   COVID-19 Vaccine (4 - Booster for Moderna series) 07/05/2020    There are no preventive care reminders to display for this patient.  Lab Results  Component Value Date   TSH 0.986 03/01/2020   Lab Results  Component Value Date   WBC 6.6 06/04/2020   HGB 12.0 06/04/2020   HCT 36.3 06/04/2020   MCV 95 06/04/2020   PLT 333 06/04/2020   Lab Results  Component Value Date   NA 138 09/10/2020   K 4.6 09/10/2020   CO2 24 09/10/2020   GLUCOSE 88 09/10/2020   BUN 24 09/10/2020   CREATININE 0.99 09/10/2020   BILITOT 0.6 03/01/2020   ALKPHOS 92 03/01/2020   AST 20 03/01/2020   ALT 11 09/10/2020   PROT 6.9 03/01/2020   ALBUMIN 4.2 03/01/2020   CALCIUM 9.2 09/10/2020   ANIONGAP 12 07/27/2017   EGFR 58 (L) 09/10/2020   GFR 40.43 (L) 12/18/2014   Lab Results  Component Value Date   CHOL 146 09/10/2020   Lab Results  Component Value Date   HDL 42 09/10/2020   Lab Results  Component Value Date   LDLCALC 92 09/10/2020   Lab Results  Component Value Date   TRIG 56 09/10/2020    Lab Results  Component Value Date   CHOLHDL 3.5 09/10/2020   Lab Results  Component Value Date   HGBA1C 5.4 03/01/2020      Assessment & Plan:   Problem List Items Addressed This Visit   None Visit Diagnoses     Cellulitis and abscess of lower extremity    -  Primary   Fall, initial encounter       Otalgia, left          Cellulitis and abscess of lower extremity: -Improved and resolved. Patient has completed antibiotic therapy. Discussed mouth ulcer possibly side effect with Bactrim. If lesion fails to improve or worsen recommend to follow up.  Fall, initial encounter: -No red flag signs/symptoms concerning for intracranial injury present so will defer obtaining imaging studies. Discussed fall prevention strategies.  -Encourage to use walker.   Left otalgia: -Discussed with patient no red flag signs/symptoms concerning for ear infection are present at this time so do not recommend antibiotic therapy. Advised to monitor symptoms. Continue with oral antihistamine and nasal spray. -If symptoms fail to improve or worsen then will consider antibiotic therapy.      No orders of the defined types were placed in this encounter.   Follow-up: Return for as scheduled .   Note:  This note was prepared with assistance of Dragon voice recognition software. Occasional wrong-word or sound-a-like substitutions may have occurred due to the inherent limitations of voice recognition software.   Lorrene Reid, PA-C

## 2020-10-28 ENCOUNTER — Other Ambulatory Visit: Payer: Self-pay | Admitting: Physician Assistant

## 2020-10-31 DIAGNOSIS — L03119 Cellulitis of unspecified part of limb: Secondary | ICD-10-CM | POA: Insufficient documentation

## 2020-10-31 DIAGNOSIS — L02419 Cutaneous abscess of limb, unspecified: Secondary | ICD-10-CM | POA: Insufficient documentation

## 2020-10-31 DIAGNOSIS — R600 Localized edema: Secondary | ICD-10-CM | POA: Insufficient documentation

## 2020-11-04 ENCOUNTER — Other Ambulatory Visit: Payer: Self-pay | Admitting: Physician Assistant

## 2020-11-10 ENCOUNTER — Other Ambulatory Visit: Payer: Self-pay

## 2020-11-10 ENCOUNTER — Ambulatory Visit (INDEPENDENT_AMBULATORY_CARE_PROVIDER_SITE_OTHER): Payer: Medicare Other | Admitting: Physician Assistant

## 2020-11-10 ENCOUNTER — Encounter: Payer: Self-pay | Admitting: Physician Assistant

## 2020-11-10 VITALS — BP 128/72 | HR 65 | Temp 98.5°F | Ht 64.0 in | Wt 219.1 lb

## 2020-11-10 DIAGNOSIS — F419 Anxiety disorder, unspecified: Secondary | ICD-10-CM

## 2020-11-10 DIAGNOSIS — I10 Essential (primary) hypertension: Secondary | ICD-10-CM

## 2020-11-10 DIAGNOSIS — K121 Other forms of stomatitis: Secondary | ICD-10-CM

## 2020-11-10 DIAGNOSIS — E782 Mixed hyperlipidemia: Secondary | ICD-10-CM | POA: Diagnosis not present

## 2020-11-10 DIAGNOSIS — N1831 Chronic kidney disease, stage 3a: Secondary | ICD-10-CM | POA: Diagnosis not present

## 2020-11-10 MED ORDER — SERTRALINE HCL 25 MG PO TABS: 25.0000 mg | ORAL_TABLET | Freq: Every day | ORAL | 0 refills | Status: DC

## 2020-11-10 MED ORDER — NYSTATIN 100000 UNIT/ML MT SUSP: 5.0000 mL | Freq: Three times a day (TID) | OROMUCOSAL | 0 refills | Status: DC | PRN

## 2020-11-10 NOTE — Assessment & Plan Note (Signed)
-  Controlled. -Continue current medication regimen. -Last BMP, renal function stable, electrolytes normal. -Will continue to monitor.

## 2020-11-10 NOTE — Assessment & Plan Note (Signed)
-  Last lipid panel: total cholesterol 146, triglycerides 56, HDL 42, LDL 92 -Continue current medication regimen. Last ALT 11. -Will continue to monitor.

## 2020-11-10 NOTE — Progress Notes (Signed)
Established Patient Office Visit  Subjective:  Patient ID: Tracey Walton, female    DOB: Sep 18, 1940  Age: 80 y.o. MRN: [id number]  CC:  Chief Complaint  Patient presents with   Follow-up   Hypertension    HPI Tracey Walton presents for follow up on hypertension, hyperlipidemia and mood. Patient states continue to have mouth sore which is some better. Does have pain with eating and chewing. States was started on hormone therapy by Urology and recently held cream because when she wants her mouth sore to heal and is concerned about med side effects contributing to poor healing.  HTN: Pt denies chest pain, palpitations, dizziness or worsening lower extremity swelling. Taking medication as directed without side effects. Checks BP at home and readings average 120s/70s. Pt follows a low salt diet. Continues to stay hydrated. Takes 0.5 tablet of water pill (HCTZ 25 mg) when needed for swelling.   HLD: Pt taking medication as directed without issues. Reports poor diligence with monitoring her carbohydrates and saturated fat intake.  Mood: Patient reports increased anxiety with multiple home stressors. States has been on fluoxetine for many years and seems to be less effective so wants to try something different. Denies SI/HI.  Past Medical History:  Diagnosis Date   Anxiety    Arthritis    "qwhere" (08/09/2017)   Chest pain 2011   negative for MI   Chronic kidney disease    renal insufficiency. 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 down after her knee surgery back in 2008   Chronic lower back pain    Depression    Dyspnea    SOB while walking   GERD (gastroesophageal reflux disease)    Heart murmur    History of hiatal hernia    Hyperlipidemia    Hypertension    Osteoporosis    Pneumonia ~ 2014   Polymyalgia ([place])    Strep throat     Past Surgical History:  Procedure Laterality Date   ABDOMINAL HYSTERECTOMY     partial   BILATERAL CARPAL TUNNEL RELEASE  2003-2005   right-left    CATARACT EXTRACTION W/ INTRAOCULAR LENS  IMPLANT, BILATERAL Bilateral    JOINT REPLACEMENT     REVERSE SHOULDER ARTHROPLASTY Left 10/12/2016   Procedure: REVERSE LEFT SHOULDER ARTHROPLASTY;  Surgeon: Justice Britain, MD;  Location: [facility];  Service: Orthopedics;  Laterality: Left;   REVERSE SHOULDER ARTHROPLASTY Right 08/09/2017   Procedure: RIGHT REVERSE SHOULDER ARTHROPLASTY;  Surgeon: Justice Britain, MD;  Location: [facility];  Service: Orthopedics;  Laterality: Right;   SHOULDER ARTHROSCOPY Right 2003   TOOTH EXTRACTION     "1 tooth" (08/09/2017)   TOTAL KNEE ARTHROPLASTY Bilateral     Family History  Problem Relation Age of Onset   Heart attack Mother    Heart disease Mother    Hypertension Father    Heart failure Father    Kidney failure Father    Hyperlipidemia Father    Stroke Father    Hypertension Sister    Hyperlipidemia Sister     Social History   Socioeconomic History   Marital status: Widowed    Spouse name: Not on file   Number of children: 3   Years of education: Not on file   Highest education level: 10th grade  Occupational History   Not on file  Tobacco Use   Smoking status: Never   Smokeless tobacco: Never  Vaping Use   Vaping Use: Never used  Substance and Sexual Activity   Alcohol  use: Never   Drug use: Never   Sexual activity: Not Currently    Birth control/protection: None  Other Topics Concern   Not on file  Social History Narrative   Lives with daughter   caffeine not much at all   Social Determinants of Health   Financial Resource Strain: Not on file  Food Insecurity: Not on file  Transportation Needs: Not on file  Physical Activity: Not on file  Stress: Not on file  Social Connections: Not on file  Intimate Partner Violence: Not on file    Outpatient Medications Prior to Visit  Medication Sig Dispense Refill   acetaminophen (TYLENOL) 325 MG tablet Take 650 mg by mouth 4 (four) times daily as needed (for pain.).     amLODipine (NORVASC)  2.5 MG tablet TAKE 1 TABLET BY MOUTH IN  THE EVENING 90 tablet 3   Baclofen 5 MG TABS Take 1 tablet by mouth 2 (two) times daily as needed.     carvedilol (COREG) 6.25 MG tablet TAKE 1 TABLET BY MOUTH  TWICE DAILY 180 tablet 1   cetirizine (ZYRTEC) 10 MG tablet Take 10 mg by mouth daily.     Coenzyme Q10 100 MG capsule Take 200 mg by mouth daily.     ferrous sulfate 325 (65 FE) MG tablet Take 1 tablet (325 mg total) by mouth daily with breakfast. Take on Monday, Wednesday, Fridays 90 tablet 1   fluticasone (FLONASE) 50 MCG/ACT nasal spray Place 2 sprays into both nostrils daily. 48 g 2   gabapentin (NEURONTIN) 100 MG capsule gabapentin 100 mg capsule  Take 1 capsule twice a day by oral route.  May cause drowsiness     hydrochlorothiazide (HYDRODIURIL) 25 MG tablet TAKE 1 TABLET BY MOUTH  DAILY 90 tablet 3   HYDROcodone-acetaminophen (NORCO/VICODIN) 5-325 MG tablet Take 1 tablet by mouth every 6 (six) hours as needed for moderate pain or severe pain.      methocarbamol (ROBAXIN) 500 MG tablet methocarbamol 500 mg tablet  Take 1 tablet twice a day by oral route as needed.     naproxen (NAPROSYN) 500 MG tablet TAKE 1 TABLET BY MOUTH  TWICE DAILY WITH A MEAL 180 tablet 0   omeprazole (PRILOSEC) 20 MG capsule TAKE 1 CAPSULE BY MOUTH  DAILY 90 capsule 0   ondansetron (ZOFRAN) 4 MG tablet Take 1 tablet (4 mg total) by mouth every 8 (eight) hours as needed for nausea or vomiting. 20 tablet 0   Polyethyl Glycol-Propyl Glycol 0.4-0.3 % SOLN Place 1 drop into both eyes 3 (three) times daily as needed (for dry/irritated eyes.).     rosuvastatin (CRESTOR) 20 MG tablet Take 1 tablet (20 mg total) by mouth daily. 90 tablet 3   sulfamethoxazole-trimethoprim (BACTRIM DS) 800-160 MG tablet Take 1 tablet by mouth 2 (two) times daily. 20 tablet 0   FLUoxetine (PROZAC) 20 MG capsule TAKE 1 CAPSULE BY MOUTH  DAILY 90 capsule 1   No facility-administered medications prior to visit.    Allergies  Allergen  Reactions   Penicillins Rash    Pt tolerated 2g ancef without issue 10/12/16    ROS Review of Systems A fourteen system review of systems was performed and found to be positive as per HPI.    Objective:    Physical Exam General:  Well Developed, well nourished, appropriate for stated age.  Neuro:  Alert and oriented,  extra-ocular muscles intact  HEENT:  Normocephalic, atraumatic, ulcer with irregular borders without erythema on left  lateral side of tongue. Skin:  no gross rash, warm, pink. Cardiac:  RRR, +murmur  Respiratory:  ECTA B/L and A/P, Not using accessory muscles, speaking in full sentences- unlabored. Vascular:  Ext warm, no cyanosis apprec.; cap RF less 2 sec. +trace edema  Psych:  No HI/SI, judgement and insight good, Euthymic mood. Full Affect.  BP 128/72   Pulse 65   Temp 98.5 F (36.9 C)   Ht _0  (1.626 m)   Wt 219 lb 1.9 oz (99.4 kg)   SpO2 94%   BMI 37.61 kg/m  Wt Readings from Last 3 Encounters:  11/10/20 219 lb 1.9 oz (99.4 kg)  10/27/20 215 lb (97.5 kg)  10/15/20 228 lb (103.4 kg)     Health Maintenance Due  Topic Date Due   TETANUS/TDAP  Never done   Zoster Vaccines- Shingrix (1 of 2) Never done   DEXA SCAN  Never done   COVID-19 Vaccine (4 - Booster for Moderna series) 07/05/2020   INFLUENZA VACCINE  11/01/2020    There are no preventive care reminders to display for this patient.  Lab Results  Component Value Date   TSH 0.986 03/01/2020   Lab Results  Component Value Date   WBC 6.6 06/04/2020   HGB 12.0 06/04/2020   HCT 36.3 06/04/2020   MCV 95 06/04/2020   PLT 333 06/04/2020   Lab Results  Component Value Date   NA 138 09/10/2020   K 4.6 09/10/2020   CO2 24 09/10/2020   GLUCOSE 88 09/10/2020   BUN 24 09/10/2020   CREATININE 0.99 09/10/2020   BILITOT 0.6 03/01/2020   ALKPHOS 92 03/01/2020   AST 20 03/01/2020   ALT 11 09/10/2020   PROT 6.9 03/01/2020   ALBUMIN 4.2 03/01/2020   CALCIUM 9.2 09/10/2020   ANIONGAP 12  07/27/2017   EGFR 58 (L) 09/10/2020   GFR 40.43 (L) 12/18/2014   Lab Results  Component Value Date   CHOL 146 09/10/2020   Lab Results  Component Value Date   HDL 42 09/10/2020   Lab Results  Component Value Date   LDLCALC 92 09/10/2020   Lab Results  Component Value Date   TRIG 56 09/10/2020   Lab Results  Component Value Date   CHOLHDL 3.5 09/10/2020   Lab Results  Component Value Date   HGBA1C 5.4 03/01/2020      Assessment & Plan:   Problem List Items Addressed This Visit       Cardiovascular and Mediastinum   Essential hypertension - Primary    -Controlled. -Continue current medication regimen. -Last BMP, renal function stable, electrolytes normal. -Will continue to monitor.         Other   Hyperlipidemia    -Last lipid panel: total cholesterol 146, triglycerides 56, HDL 42, LDL 92 -Continue current medication regimen. Last ALT 11. -Will continue to monitor.       Other Visit Diagnoses     Stage 3a chronic kidney disease (HCC)       Anxiety       Relevant Medications   sertraline (ZOLOFT) 25 MG tablet   Mouth ulcer       Relevant Medications   magic mouthwash (nystatin, lidocaine, diphenhydrAMINE) suspension      Stage 3a CKD: -Last BMP, Cr 0.990 eGFR 58. -Will continue to monitor.  Anxiety:  -PHQ-9 score of 0., GAD-7 score 3. -Discussed with patient alternative treatment options and is agreeable to change to another SSRI. Will start low dose sertraline. Advised  to let me know if unable to tolerate medication. Discussed tapering instruction for fluoxetine.  -Will continue to monitor and reassess at follow up visit.  Mouth ulcer: -Gradually improving. Will send rx for magic mouthwash to help improve symptoms and provide some pain relief. -If symptoms fail to improve or worsen recommend referral to ENT.     Meds ordered this encounter  Medications   sertraline (ZOLOFT) 25 MG tablet    Sig: Take 1 tablet (25 mg total) by mouth  daily.    Dispense:  90 tablet    Refill:  0    Order Specific Question:   Supervising Provider    Answer:   Beatrice Lecher D [[id number]]   magic mouthwash (nystatin, lidocaine, diphenhydrAMINE) suspension    Sig: Take 5 mLs by mouth 3 (three) times daily as needed for mouth pain.    Dispense:  180 mL    Refill:  0    Order Specific Question:   Supervising Provider    Answer:   Beatrice Lecher D [[id number]]    Follow-up: Return in about 3 months (around 02/10/2021) for [facility] and [facility] few days prior .   Note:  This note was prepared with assistance of Dragon voice recognition software. Occasional wrong-word or sound-a-like substitutions may have occurred due to the inherent limitations of voice recognition software.  Lorrene Reid, PA-C

## 2020-11-27 ENCOUNTER — Other Ambulatory Visit: Payer: Self-pay | Admitting: Cardiovascular Disease

## 2020-11-27 DIAGNOSIS — I10 Essential (primary) hypertension: Secondary | ICD-10-CM

## 2020-11-27 DIAGNOSIS — E782 Mixed hyperlipidemia: Secondary | ICD-10-CM

## 2020-12-27 ENCOUNTER — Ambulatory Visit (INDEPENDENT_AMBULATORY_CARE_PROVIDER_SITE_OTHER): Payer: Medicare Other

## 2020-12-27 ENCOUNTER — Other Ambulatory Visit: Payer: Self-pay

## 2020-12-27 DIAGNOSIS — Z0000 Encounter for general adult medical examination without abnormal findings: Secondary | ICD-10-CM

## 2020-12-27 NOTE — Patient Instructions (Signed)
Health Maintenance, Female Adopting a healthy lifestyle and getting preventive care are important in promoting health and wellness. Ask your health care provider about: The right schedule for you to have regular tests and exams. Things you can do on your own to prevent diseases and keep yourself healthy. What should I know about diet, weight, and exercise? Eat a healthy diet  Eat a diet that includes plenty of vegetables, fruits, low-fat dairy products, and lean protein. Do not eat a lot of foods that are high in solid fats, added sugars, or sodium. Maintain a healthy weight Body mass index (BMI) is used to identify weight problems. It estimates body fat based on height and weight. Your health care provider can help determine your BMI and help you achieve or maintain a healthy weight. Get regular exercise Get regular exercise. This is one of the most important things you can do for your health. Most adults should: Exercise for at least 150 minutes each week. The exercise should increase your heart rate and make you sweat (moderate-intensity exercise). Do strengthening exercises at least twice a week. This is in addition to the moderate-intensity exercise. Spend less time sitting. Even light physical activity can be beneficial. Watch cholesterol and blood lipids Have your blood tested for lipids and cholesterol at 80 years of age, then have this test every 5 years. Have your cholesterol levels checked more often if: Your lipid or cholesterol levels are high. You are older than 80 years of age. You are at high risk for heart disease. What should I know about cancer screening? Depending on your health history and family history, you may need to have cancer screening at various ages. This may include screening for: Breast cancer. Cervical cancer. Colorectal cancer. Skin cancer. Lung cancer. What should I know about heart disease, diabetes, and high blood pressure? Blood pressure and heart  disease High blood pressure causes heart disease and increases the risk of stroke. This is more likely to develop in people who have high blood pressure readings, are of African descent, or are overweight. Have your blood pressure checked: Every 3-5 years if you are 18-39 years of age. Every year if you are 40 years old or older. Diabetes Have regular diabetes screenings. This checks your fasting blood sugar level. Have the screening done: Once every three years after age 40 if you are at a normal weight and have a low risk for diabetes. More often and at a younger age if you are overweight or have a high risk for diabetes. What should I know about preventing infection? Hepatitis B If you have a higher risk for hepatitis B, you should be screened for this virus. Talk with your health care provider to find out if you are at risk for hepatitis B infection. Hepatitis C Testing is recommended for: Everyone born from 1945 through 1965. Anyone with known risk factors for hepatitis C. Sexually transmitted infections (STIs) Get screened for STIs, including gonorrhea and chlamydia, if: You are sexually active and are younger than 80 years of age. You are older than 80 years of age and your health care provider tells you that you are at risk for this type of infection. Your sexual activity has changed since you were last screened, and you are at increased risk for chlamydia or gonorrhea. Ask your health care provider if you are at risk. Ask your health care provider about whether you are at high risk for HIV. Your health care provider may recommend a prescription medicine   to help prevent HIV infection. If you choose to take medicine to prevent HIV, you should first get tested for HIV. You should then be tested every 3 months for as long as you are taking the medicine. Pregnancy If you are about to stop having your period (premenopausal) and you may become pregnant, seek counseling before you get  pregnant. Take 400 to 800 micrograms (mcg) of folic acid every day if you become pregnant. Ask for birth control (contraception) if you want to prevent pregnancy. Osteoporosis and menopause Osteoporosis is a disease in which the bones lose minerals and strength with aging. This can result in bone fractures. If you are 65 years old or older, or if you are at risk for osteoporosis and fractures, ask your health care provider if you should: Be screened for bone loss. Take a calcium or vitamin D supplement to lower your risk of fractures. Be given hormone replacement therapy (HRT) to treat symptoms of menopause. Follow these instructions at home: Lifestyle Do not use any products that contain nicotine or tobacco, such as cigarettes, e-cigarettes, and chewing tobacco. If you need help quitting, ask your health care provider. Do not use street drugs. Do not share needles. Ask your health care provider for help if you need support or information about quitting drugs. Alcohol use Do not drink alcohol if: Your health care provider tells you not to drink. You are pregnant, may be pregnant, or are planning to become pregnant. If you drink alcohol: Limit how much you use to 0-1 drink a day. Limit intake if you are breastfeeding. Be aware of how much alcohol is in your drink. In the [place] one drink equals one 12 oz bottle of beer (355 mL), one 5 oz glass of wine (148 mL), or one 1 oz glass of hard liquor (44 mL). General instructions Schedule regular health, dental, and eye exams. Stay current with your vaccines. Tell your health care provider if: You often feel depressed. You have ever been abused or do not feel safe at home. Summary Adopting a healthy lifestyle and getting preventive care are important in promoting health and wellness. Follow your health care provider's instructions about healthy diet, exercising, and getting tested or screened for diseases. Follow your health care provider's  instructions on monitoring your cholesterol and blood pressure. This information is not intended to replace advice given to you by your health care provider. Make sure you discuss any questions you have with your health care provider. Document Revised: 05/28/2020 Document Reviewed: 03/13/2018 Elsevier Patient Education  2022 [employer]  

## 2020-12-27 NOTE — Progress Notes (Signed)
Subjective:   ANALISE GLOTFELTY is a 80 y.o. female who presents for Medicare Annual (Subsequent) preventive examination.  I connected with  Herbert Seta on 12/27/20 by audio enabled telemedicine application and verified that I am speaking with the correct person using two identifiers.   I discussed the limitations of evaluation and management by telemedicine. The patient expressed understanding and agreed to proceed.   Location of Patient: Home Location of Provider: Office  List any persons and their roles That are participating in the visit with the patient. Jannifer Franklin, LPN   Review of Systems    Defer to PCP Cardiac Risk Factors include: sedentary lifestyle;obesity (BMI >30kg/m2);advanced age (>21men, >82 women);hypertension     Objective:    There were no vitals filed for this visit. There is no height or weight on file to calculate BMI.  Advanced Directives 12/27/2020 11/07/2017 08/09/2017 08/09/2017 07/27/2017 10/12/2016 10/09/2016  Does Patient Have a Medical Advance Directive? No No - Yes Yes Yes Yes  Type of Advance Directive - - Healthcare Power of [place];Living will Healthcare Power of [place];Living will Healthcare Power of [place];Living will Living will Living will  Does patient want to make changes to medical advance directive? - - - No - Patient declined - No - Patient declined No - Patient declined  Copy of Healthcare Power of Attorney in Chart? - - No - copy requested - No - copy requested - -  Would patient like information on creating a medical advance directive? No - Patient declined No - Patient declined - - - - -    Current Medications (verified) Outpatient Encounter Medications as of 12/27/2020  Medication Sig   acetaminophen (TYLENOL) 325 MG tablet Take 650 mg by mouth 4 (four) times daily as needed (for pain.).   amLODipine (NORVASC) 2.5 MG tablet TAKE 1 TABLET BY MOUTH IN  THE EVENING   Baclofen 5 MG TABS Take 1 tablet by mouth 2 (two) times daily as  needed.   carvedilol (COREG) 6.25 MG tablet TAKE 1 TABLET BY MOUTH  TWICE DAILY   cetirizine (ZYRTEC) 10 MG tablet Take 10 mg by mouth daily.   Coenzyme Q10 100 MG capsule Take 200 mg by mouth daily.   ferrous sulfate 325 (65 FE) MG tablet Take 1 tablet (325 mg total) by mouth daily with breakfast. Take on Monday, Wednesday, Fridays   fluticasone (FLONASE) 50 MCG/ACT nasal spray Place 2 sprays into both nostrils daily.   gabapentin (NEURONTIN) 100 MG capsule gabapentin 100 mg capsule  Take 1 capsule twice a day by oral route.  May cause drowsiness   hydrochlorothiazide (HYDRODIURIL) 25 MG tablet TAKE 1 TABLET BY MOUTH  DAILY   HYDROcodone-acetaminophen (NORCO/VICODIN) 5-325 MG tablet Take 1 tablet by mouth every 6 (six) hours as needed for moderate pain or severe pain.    magic mouthwash (nystatin, lidocaine, diphenhydrAMINE) suspension Take 5 mLs by mouth 3 (three) times daily as needed for mouth pain.   methocarbamol (ROBAXIN) 500 MG tablet methocarbamol 500 mg tablet  Take 1 tablet twice a day by oral route as needed.   naproxen (NAPROSYN) 500 MG tablet TAKE 1 TABLET BY MOUTH  TWICE DAILY WITH A MEAL   omeprazole (PRILOSEC) 20 MG capsule TAKE 1 CAPSULE BY MOUTH  DAILY   ondansetron (ZOFRAN) 4 MG tablet Take 1 tablet (4 mg total) by mouth every 8 (eight) hours as needed for nausea or vomiting.   Polyethyl Glycol-Propyl Glycol 0.4-0.3 % SOLN Place 1 drop into both  eyes 3 (three) times daily as needed (for dry/irritated eyes.).   rosuvastatin (CRESTOR) 20 MG tablet Take 1 tablet (20 mg total) by mouth daily.   sertraline (ZOLOFT) 25 MG tablet Take 1 tablet (25 mg total) by mouth daily.   sulfamethoxazole-trimethoprim (BACTRIM DS) 800-160 MG tablet Take 1 tablet by mouth 2 (two) times daily.   No facility-administered encounter medications on file as of 12/27/2020.    Allergies (verified) Penicillins   History: Past Medical History:  Diagnosis Date   Anxiety    Arthritis    "qwhere"  (08/09/2017)   Chest pain 2011   negative for MI   Chronic kidney disease    renal insufficiency. 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 down after her knee surgery back in 2008   Chronic lower back pain    Depression    Dyspnea    SOB while walking   GERD (gastroesophageal reflux disease)    Heart murmur    History of hiatal hernia    Hyperlipidemia    Hypertension    Osteoporosis    Pneumonia ~ 2014   Polymyalgia (HCC)    Strep throat    Past Surgical History:  Procedure Laterality Date   ABDOMINAL HYSTERECTOMY     partial   BILATERAL CARPAL TUNNEL RELEASE  2003-2005   right-left   CATARACT EXTRACTION W/ INTRAOCULAR LENS  IMPLANT, BILATERAL Bilateral    JOINT REPLACEMENT     REVERSE SHOULDER ARTHROPLASTY Left 10/12/2016   Procedure: REVERSE LEFT SHOULDER ARTHROPLASTY;  Surgeon: Francena Hanly, MD;  Location: [facility] OR;  Service: Orthopedics;  Laterality: Left;   REVERSE SHOULDER ARTHROPLASTY Right 08/09/2017   Procedure: RIGHT REVERSE SHOULDER ARTHROPLASTY;  Surgeon: Francena Hanly, MD;  Location: [facility] OR;  Service: Orthopedics;  Laterality: Right;   SHOULDER ARTHROSCOPY Right 2003   TOOTH EXTRACTION     "1 tooth" (08/09/2017)   TOTAL KNEE ARTHROPLASTY Bilateral    Family History  Problem Relation Age of Onset   Heart attack Mother    Heart disease Mother    Hypertension Father    Heart failure Father    Kidney failure Father    Hyperlipidemia Father    Stroke Father    Hypertension Sister    Hyperlipidemia Sister    Social History   Socioeconomic History   Marital status: Widowed    Spouse name: Not on file   Number of children: 3   Years of education: Not on file   Highest education level: 10th grade  Occupational History   Not on file  Tobacco Use   Smoking status: Never   Smokeless tobacco: Never  Vaping Use   Vaping Use: Never used  Substance and Sexual Activity   Alcohol use: Never   Drug use: Never   Sexual activity: Not Currently    Birth control/protection: None   Other Topics Concern   Not on file  Social History Narrative   Lives with daughter   caffeine not much at all   Social Determinants of Health   Financial Resource Strain: Low Risk    Difficulty of Paying Living Expenses: Not very hard  Food Insecurity: No Food Insecurity   Worried About Programme researcher, broadcasting/film/video in the Last Year: Never true   Ran Out of Food in the Last Year: Never true  Transportation Needs: No Transportation Needs   Lack of Transportation (Medical): No   Lack of Transportation (Non-Medical): No  Physical Activity: Inactive   Days of Exercise per Week: 0 days  Minutes of Exercise per Session: 0 min  Stress: No Stress Concern Present   Feeling of Stress : Only a little  Social Connections: Moderately Integrated   Frequency of Communication with Friends and Family: More than three times a week   Frequency of Social Gatherings with Friends and Family: More than three times a week   Attends Religious Services: More than 4 times per year   Active Member of [employer] or Organizations: Yes   Attends Banker Meetings: More than 4 times per year   Marital Status: Widowed    Tobacco Counseling Counseling given: Not Answered   Clinical Intake:  Pre-visit preparation completed: Yes  Pain : No/denies pain (N0 pain today)     BMI - recorded: 37.59 (Recorded on 11-10-2020) Nutritional Status: BMI > 30  Obese Nutritional Risks: None Diabetes: No  How often do you need to have someone help you when you read instructions, pamphlets, or other written materials from your doctor or pharmacy?: 1 - Never What is the last grade level you completed in school?: 10th  Diabetic?No  Interpreter Needed?: No  Information entered by :: Jannifer Franklin, LPN   Activities of Daily Living In your present state of health, do you have any difficulty performing the following activities: 12/27/2020 11/10/2020  Hearing? N N  Vision? N Y  Difficulty concentrating or making  decisions? [place]  Walking or climbing stairs? Y Y  Dressing or bathing? N N  Doing errands, shopping? N N  Preparing Food and eating ? N -  Using the Toilet? N -  In the past six months, have you accidently leaked urine? N -  Do you have problems with loss of bowel control? N -  Managing your Medications? N -  Managing your Finances? N -  Housekeeping or managing your Housekeeping? N -  Some recent data might be hidden    Patient Care Team: Mayer Masker, PA-C as PCP - General (Physician Assistant) Nahser, Deloris Ping, MD as PCP - Cardiology (Cardiology)  Indicate any recent Medical Services you may have received from other than [facility] providers in the past year (date may be approximate).     Assessment:   This is a routine wellness examination for Renita.  Hearing/Vision screen No results found.  Dietary issues and exercise activities discussed: Current Exercise Habits: The patient does not participate in regular exercise at present   Goals Addressed   None   Depression Screen PHQ 2/9 Scores 12/27/2020 12/27/2020 11/10/2020 10/27/2020 10/15/2020 07/07/2020 06/11/2020  PHQ - 2 Score 0 0 0 0 1 0 0  PHQ- 9 Score 0 0 0 6 10 0 3    Fall Risk Fall Risk  12/27/2020 11/10/2020 10/27/2020 10/15/2020 07/07/2020  Falls in the past year? 1 0 1 1 1   Number falls in past yr: 1 1 1 1  -  Injury with Fall? 0 0 0 0 -  Comment - - - - -  Risk for fall due to : - - History of fall(s);No Fall Risks;Impaired balance/gait;Impaired mobility - -  Follow up Falls evaluation completed Falls evaluation completed Falls evaluation completed Falls evaluation completed Falls evaluation completed    FALL RISK PREVENTION PERTAINING TO THE HOME:  Any stairs in or around the home? No  If so, are there any without handrails? No  Home free of loose throw rugs in walkways, pet beds, electrical cords, etc? Yes  Adequate lighting in your home to reduce risk of falls? Yes   ASSISTIVE  DEVICES UTILIZED TO PREVENT  FALLS:  Life alert? No  Use of a cane, walker or w/c? Yes  Grab bars in the bathroom? No  Shower chair or bench in shower? No  Elevated toilet seat or a handicapped toilet? No   TIMED UP AND GO:  Was the test performed?  N/A .  Length of time to ambulate 10 feet: N/A sec.     Cognitive Function:     6CIT Screen 12/27/2020 12/03/2019  What Year? 0 points 0 points  What month? 0 points 0 points  What time? 0 points 0 points  Count back from 20 2 points 2 points  Months in reverse 0 points 0 points  Repeat phrase 0 points 6 points  Total Score 2 8    Immunizations Immunization History  Administered Date(s) Administered   Influenza, High Dose Seasonal PF 02/21/2018, 12/21/2018   Influenza,inj,quad, With Preservative 02/11/2017   Influenza-Unspecified 01/25/2020   Moderna Sars-Covid-2 Vaccination 07/22/2019, 08/27/2019, 04/06/2020   Pneumococcal Conjugate-13 03/19/2016   Pneumococcal Polysaccharide-23 11/07/2017, 11/14/2017   Zoster, Live 12/16/2011    TDAP status: Due, Education has been provided regarding the importance of this vaccine. Advised may receive this vaccine at local pharmacy or Health Dept. Aware to provide a copy of the vaccination record if obtained from local pharmacy or Health Dept. Verbalized acceptance and understanding.  Flu Vaccine status: Due, Education has been provided regarding the importance of this vaccine. Advised may receive this vaccine at local pharmacy or Health Dept. Aware to provide a copy of the vaccination record if obtained from local pharmacy or Health Dept. Verbalized acceptance and understanding.  Pneumococcal vaccine status: Up to date  Covid-19 vaccine status: Information provided on how to obtain vaccines.   Qualifies for Shingles Vaccine? Yes   Zostavax completed No   Shingrix Completed?: No.    Education has been provided regarding the importance of this vaccine. Patient has been advised to call insurance company to determine out  of pocket expense if they have not yet received this vaccine. Advised may also receive vaccine at local pharmacy or Health Dept. Verbalized acceptance and understanding.  Screening Tests Health Maintenance  Topic Date Due   TETANUS/TDAP  Never done   Zoster Vaccines- Shingrix (1 of 2) Never done   DEXA SCAN  Never done   COVID-19 Vaccine (4 - Booster for Moderna series) 06/29/2020   INFLUENZA VACCINE  11/01/2020   Hepatitis C Screening  Completed   HPV VACCINES  Aged Out    Health Maintenance  Health Maintenance Due  Topic Date Due   TETANUS/TDAP  Never done   Zoster Vaccines- Shingrix (1 of 2) Never done   DEXA SCAN  Never done   COVID-19 Vaccine (4 - Booster for Moderna series) 06/29/2020   INFLUENZA VACCINE  11/01/2020    Colorectal cancer screening: Pt refuses   Mammogram status: Pt refuses  Bone Density status: Pt refuses  Lung Cancer Screening: (Low Dose CT Chest recommended if Age 8-80 years, 30 pack-year currently smoking OR have quit w/in 15years.) does not qualify.   Lung Cancer Screening Referral: N/A  Additional Screening:  Hepatitis C Screening: does qualify; Completed 11-09-2017  Vision Screening: Recommended annual ophthalmology exams for early detection of glaucoma and other disorders of the eye. Is the patient up to date with their annual eye exam?  No  Who is the provider or what is the name of the office in which the patient attends annual eye exams? [facility] If  pt is not established with a provider, would they like to be referred to a provider to establish care? No .   Dental Screening: Recommended annual dental exams for proper oral hygiene  Community Resource Referral / Chronic Care Management: CRR required this visit?  No   CCM required this visit?  No      Plan:     I have personally reviewed and noted the following in the patient's chart:   Medical and social history Use of alcohol, tobacco or illicit drugs  Current  medications and supplements including opioid prescriptions.  Functional ability and status Nutritional status Physical activity Advanced directives List of other physicians Hospitalizations, surgeries, and ER visits in previous 12 months Vitals Screenings to include cognitive, depression, and falls Referrals and appointments  In addition, I have reviewed and discussed with patient certain preventive protocols, quality metrics, and best practice recommendations. A written personalized care plan for preventive services as well as general preventive health recommendations were provided to patient.     Jannifer Franklin, LPN   [id number]   Nurse Notes: Non Face to Face 45 min visit   Ms. Blea , Thank you for taking time to come for your Medicare Wellness Visit. I appreciate your ongoing commitment to your health goals. Please review the following plan we discussed and let me know if I can assist you in the future.   These are the goals we discussed:  Goals   None     This is a list of the screening recommended for you and due dates:  Health Maintenance  Topic Date Due   Tetanus Vaccine  Never done   Zoster (Shingles) Vaccine (1 of 2) Never done   DEXA scan (bone density measurement)  Never done   COVID-19 Vaccine (4 - Booster for Moderna series) 06/29/2020   Flu Shot  11/01/2020   Hepatitis C Screening: USPSTF Recommendation to screen - Ages 18-79 yo.  Completed   HPV Vaccine  Aged Out

## 2021-01-17 ENCOUNTER — Other Ambulatory Visit: Payer: Self-pay | Admitting: Physician Assistant

## 2021-01-17 DIAGNOSIS — I10 Essential (primary) hypertension: Secondary | ICD-10-CM

## 2021-01-17 DIAGNOSIS — E785 Hyperlipidemia, unspecified: Secondary | ICD-10-CM

## 2021-01-20 ENCOUNTER — Other Ambulatory Visit: Payer: Self-pay

## 2021-01-20 ENCOUNTER — Encounter (HOSPITAL_COMMUNITY): Payer: Self-pay | Admitting: Emergency Medicine

## 2021-01-20 ENCOUNTER — Emergency Department (HOSPITAL_COMMUNITY): Payer: Medicare Other

## 2021-01-20 ENCOUNTER — Emergency Department (HOSPITAL_COMMUNITY)
Admission: EM | Admit: 2021-01-20 | Discharge: 2021-01-20 | Disposition: A | Discharge status: Home / Self Care | Payer: Medicare Other | Attending: Emergency Medicine | Admitting: Emergency Medicine

## 2021-01-20 DIAGNOSIS — S42101A Fracture of unspecified part of scapula, right shoulder, initial encounter for closed fracture: Secondary | ICD-10-CM | POA: Insufficient documentation

## 2021-01-20 DIAGNOSIS — S4991XA Unspecified injury of right shoulder and upper arm, initial encounter: Secondary | ICD-10-CM | POA: Diagnosis present

## 2021-01-20 DIAGNOSIS — Y9301 Activity, walking, marching and hiking: Secondary | ICD-10-CM | POA: Insufficient documentation

## 2021-01-20 DIAGNOSIS — I10 Essential (primary) hypertension: Secondary | ICD-10-CM | POA: Insufficient documentation

## 2021-01-20 DIAGNOSIS — W19XXXA Unspecified fall, initial encounter: Secondary | ICD-10-CM

## 2021-01-20 DIAGNOSIS — W010XXA Fall on same level from slipping, tripping and stumbling without subsequent striking against object, initial encounter: Secondary | ICD-10-CM | POA: Insufficient documentation

## 2021-01-20 DIAGNOSIS — M25561 Pain in right knee: Secondary | ICD-10-CM | POA: Insufficient documentation

## 2021-01-20 NOTE — ED Provider Notes (Signed)
[facility]-EM[facility]GENCY DEPT Provider Note   CSN: [id number] Arrival date & time: 01/20/21  0211     History Chief Complaint  Patient presents with   Fall   Shoulder Injury    Tracey Walton is a 80 y.o. female.  Patient presents with shoulder and knee pain after falling.  States he was walking to the bathroom and heard "house shoes" when she stumbled over a rug.  She fell to the ground on her right knee and right shoulder.  Did not hit her head or lose consciousness.  Complains of pain to her right shoulder, upper arm and right knee.  Does have a history of previous replacements of both her shoulder and her knee.  Confident she not hit her head.  No blood thinner use.  No neck or back pain.  Took her usual Vicodin with partial relief.  States she has chronic pain from arthritis. Denies any preceding dizziness or lightheadedness. No chest pain or shortness of breath.  No abdominal pain, nausea, vomiting or diarrhea.  The history is provided by the patient.  Fall Pertinent negatives include no abdominal pain, no headaches and no shortness of breath.  Shoulder Injury Pertinent negatives include no abdominal pain, no headaches and no shortness of breath.      Past Medical History:  Diagnosis Date   Anxiety    Arthritis    "qwhere" (08/09/2017)   Chest pain 2011   negative for MI   Chronic kidney disease    renal insufficiency. 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 down after her knee surgery back in 2008   Chronic lower back pain    Depression    Dyspnea    SOB while walking   GERD (gastroesophageal reflux disease)    Heart murmur    History of hiatal hernia    Hyperlipidemia    Hypertension    Osteoporosis    Pneumonia ~ 2014   Polymyalgia (HCC)    Strep throat     Patient Active Problem List   Diagnosis Date Noted   Cellulitis and abscess of lower extremity 10/31/2020   Bilateral lower extremity edema 10/31/2020   Hyponatremia 06/13/2020   Recurrent UTI  (urinary tract infection) 06/07/2020   Hematuria 06/07/2020   Falls frequently 06/07/2020   Acute non-recurrent pansinusitis 05/31/2020   Urinary tract infection with hematuria 05/31/2020   Dysuria 05/31/2020   Essential hypertension 03/24/2020   Dizziness 05/12/2019   Fluid level behind tympanic membrane of both ears 05/12/2019   Porokeratosis 03/26/2019   Leg pain 01/10/2018   Depression 11/07/2017   Healthcare maintenance 11/07/2017   Dyspnea on exertion 11/07/2017   Obesity with body mass index of 30.0-39.9 11/07/2017   History of reverse total replacement of right shoulder joint 08/24/2017   Long-term current use of opiate analgesic 07/25/2017   Rotator cuff tear arthropathy 05/25/2017   Pain in joint of right shoulder 05/25/2017   S/p reverse total shoulder arthroplasty 10/12/2016   Mild aortic stenosis 07/06/2014   Hypertension 05/29/2014   Chest discomfort 05/29/2014   Hyperlipidemia 05/29/2014   Polymyalgia (HCC) 01/04/2013   Chronic low back pain 09/30/2012   History of total knee replacement, right 06/22/2009   History of total knee replacement, left 03/06/2002    Past Surgical History:  Procedure Laterality Date   ABDOMINAL HYSTERECTOMY     partial   BILATERAL CARPAL TUNNEL RELEASE  2003-2005   right-left   CATARACT EXTRACTION W/ INTRAOCULAR LENS  IMPLANT, BILATERAL Bilateral  JOINT REPLACEMENT     REVERSE SHOULDER ARTHROPLASTY Left 10/12/2016   Procedure: REVERSE LEFT SHOULDER ARTHROPLASTY;  Surgeon: Francena Hanly, MD;  Location: [facility] OR;  Service: Orthopedics;  Laterality: Left;   REVERSE SHOULDER ARTHROPLASTY Right 08/09/2017   Procedure: RIGHT REVERSE SHOULDER ARTHROPLASTY;  Surgeon: Francena Hanly, MD;  Location: [facility] OR;  Service: Orthopedics;  Laterality: Right;   SHOULDER ARTHROSCOPY Right 2003   TOOTH EXTRACTION     "1 tooth" (08/09/2017)   TOTAL KNEE ARTHROPLASTY Bilateral      OB History   No obstetric history on file.     Family History  Problem  Relation Age of Onset   Heart attack Mother    Heart disease Mother    Hypertension Father    Heart failure Father    Kidney failure Father    Hyperlipidemia Father    Stroke Father    Hypertension Sister    Hyperlipidemia Sister     Social History   Tobacco Use   Smoking status: Never   Smokeless tobacco: Never  Vaping Use   Vaping Use: Never used  Substance Use Topics   Alcohol use: Never   Drug use: Never    Home Medications Prior to Admission medications   Medication Sig Start Date End Date Taking? Authorizing Provider  acetaminophen (TYLENOL) 325 MG tablet Take 650 mg by mouth 4 (four) times daily as needed (for pain.).    [provider]  amLODipine (NORVASC) 2.5 MG tablet TAKE 1 TABLET BY MOUTH IN  THE EVENING 11/30/20   Nahser, Deloris Ping, MD  Baclofen 5 MG TABS Take 1 tablet by mouth 2 (two) times daily as needed. 09/29/20   [provider]  carvedilol (COREG) 6.25 MG tablet TAKE 1 TABLET BY MOUTH  TWICE DAILY 11/30/20   Nahser, Deloris Ping, MD  cetirizine (ZYRTEC) 10 MG tablet Take 10 mg by mouth daily.    [provider]  Coenzyme Q10 100 MG capsule Take 200 mg by mouth daily.    [provider]  ferrous sulfate 325 (65 FE) MG tablet Take 1 tablet (325 mg total) by mouth daily with breakfast. Take on Monday, Wednesday, Fridays 03/14/18   Danford, Orpha Bur D, NP  fluticasone (FLONASE) 50 MCG/ACT nasal spray Place 2 sprays into both nostrils daily. 05/28/20   Mayer Masker, PA-C  gabapentin (NEURONTIN) 100 MG capsule gabapentin 100 mg capsule  Take 1 capsule twice a day by oral route.  May cause drowsiness    [provider]  hydrochlorothiazide (HYDRODIURIL) 25 MG tablet TAKE 1 TABLET BY MOUTH  DAILY 11/30/20   Nahser, Deloris Ping, MD  HYDROcodone-acetaminophen (NORCO/VICODIN) 5-325 MG tablet Take 1 tablet by mouth every 6 (six) hours as needed for moderate pain or severe pain.     [provider]  magic mouthwash (nystatin,  lidocaine, diphenhydrAMINE) suspension Take 5 mLs by mouth 3 (three) times daily as needed for mouth pain. 11/10/20   Mayer Masker, PA-C  methocarbamol (ROBAXIN) 500 MG tablet methocarbamol 500 mg tablet  Take 1 tablet twice a day by oral route as needed.    [provider]  naproxen (NAPROSYN) 500 MG tablet TAKE 1 TABLET BY MOUTH  TWICE DAILY WITH A MEAL 01/18/21   Abonza, Maritza, PA-C  omeprazole (PRILOSEC) 20 MG capsule TAKE 1 CAPSULE BY MOUTH  DAILY 01/18/21   Abonza, Maritza, PA-C  ondansetron (ZOFRAN) 4 MG tablet Take 1 tablet (4 mg total) by mouth every 8 (eight) hours as needed for nausea  or vomiting. 08/06/20   Mayer Masker, PA-C  Polyethyl Glycol-Propyl Glycol 0.4-0.3 % SOLN Place 1 drop into both eyes 3 (three) times daily as needed (for dry/irritated eyes.).    [provider]  rosuvastatin (CRESTOR) 20 MG tablet Take 1 tablet (20 mg total) by mouth daily. 06/17/20   Nahser, Deloris Ping, MD  sertraline (ZOLOFT) 25 MG tablet Take 1 tablet (25 mg total) by mouth daily. 11/10/20   Mayer Masker, PA-C  sulfamethoxazole-trimethoprim (BACTRIM DS) 800-160 MG tablet Take 1 tablet by mouth 2 (two) times daily. 10/15/20   Carlean Jews, NP    Allergies    Penicillins  Review of Systems   Review of Systems  Constitutional:  Negative for activity change, appetite change and fever.  HENT:  Negative for congestion and sinus pain.   Respiratory:  Negative for cough, chest tightness and shortness of breath.   Gastrointestinal:  Negative for abdominal pain, nausea and vomiting.  Genitourinary:  Negative for dysuria and hematuria.  Musculoskeletal:  Positive for arthralgias and myalgias. Negative for gait problem.  Skin:  Negative for rash.  Neurological:  Negative for dizziness, light-headedness and headaches.   all other systems are negative except as noted in the HPI and PMH.   Physical Exam Updated Vital Signs BP (!) 209/88 (BP Location: Left Arm)   Pulse 81   Temp  98.1 F (36.7 C) (Oral)   Resp 18   SpO2 97%   Physical Exam Vitals and nursing note reviewed.  Constitutional:      General: She is not in acute distress.    Appearance: She is well-developed.  HENT:     Head: Normocephalic and atraumatic.     Mouth/Throat:     Pharynx: No oropharyngeal exudate.  Eyes:     Conjunctiva/sclera: Conjunctivae normal.     Pupils: Pupils are equal, round, and reactive to light.  Neck:     Comments: No C-spine tender Cardiovascular:     Rate and Rhythm: Normal rate and regular rhythm.     Heart sounds: Normal heart sounds. No murmur heard. Pulmonary:     Effort: Pulmonary effort is normal. No respiratory distress.     Breath sounds: Normal breath sounds.  Abdominal:     Palpations: Abdomen is soft.     Tenderness: There is no abdominal tenderness. There is no guarding or rebound.  Musculoskeletal:        General: Tenderness present. Normal range of motion.     Cervical back: Normal range of motion and neck supple.     Comments: Tenderness to right lateral and posterior shoulder as well as scapula.  No deformity.  Reduced range of motion due to pain. No pain across clavicle.  Full range of motion of right elbow and wrist without pain.  Cardinal hand movements intact, radial pulse intact.  Ecchymosis to right anterior patella.  No deformity.  Flexion and extension intact.  No pain across tibial plateau.  Skin:    General: Skin is warm.  Neurological:     Mental Status: She is alert and oriented to person, place, and time.     Cranial Nerves: No cranial nerve deficit.     Motor: No abnormal muscle tone.     Coordination: Coordination normal.     Comments:  5/5 strength throughout. CN 2-12 intact.Equal grip strength.   Psychiatric:        Behavior: Behavior normal.    ED Results / Procedures / Treatments   Labs (all labs  ordered are listed, but only abnormal results are displayed) Labs Reviewed - No data to  display  EKG None  Radiology DG Scapula Right  Result Date: 01/20/2021 CLINICAL DATA:  Fall EXAM: RIGHT SCAPULA - 2+ VIEWS COMPARISON:  None. FINDINGS: Lateral scapular fracture along the base of the coracoid process. Right shoulder arthroplasty. Clavicles intact. Visualized right lung is clear. IMPRESSION: Lateral scapular fracture. Electronically Signed   By: Charline Bills M.D.   On: 01/20/2021 03:12   DG Shoulder Right  Result Date: 01/20/2021 CLINICAL DATA:  Fall EXAM: RIGHT SHOULDER - 2+ VIEW COMPARISON:  None. FINDINGS: Right shoulder arthroplasty, without evidence of complication. Lateral scapular fracture along the base of the coracoid process. Clavicle is intact. Visualized right lung is clear. IMPRESSION: Lateral scapular fracture. Electronically Signed   By: Charline Bills M.D.   On: 01/20/2021 03:11   DG Knee Complete 4 Views Right  Result Date: 01/20/2021 CLINICAL DATA:  Fall EXAM: RIGHT KNEE - COMPLETE 4+ VIEW COMPARISON:  None. FINDINGS: Right knee arthroplasty, without evidence of complication. No fracture or dislocation is seen. Visualized soft tissues are within normal limits. No suprapatellar knee joint effusion. IMPRESSION: Right knee arthroplasty, without evidence of complication. Electronically Signed   By: Charline Bills M.D.   On: 01/20/2021 03:12   DG Humerus Right  Result Date: 01/20/2021 CLINICAL DATA:  Fall EXAM: RIGHT HUMERUS - 2+ VIEW COMPARISON:  None. FINDINGS: Right shoulder arthroplasty.  No evidence of complication. No fracture or dislocation is seen. Visualized soft tissues are within normal limits. Visualized right lung is clear. IMPRESSION: Right shoulder arthroplasty, without evidence of complication. Electronically Signed   By: Charline Bills M.D.   On: 01/20/2021 03:07    Procedures Procedures   Medications Ordered in ED Medications - No data to display  ED Course  I have reviewed the triage vital signs and the nursing  notes.  Pertinent labs & imaging results that were available during my care of the patient were reviewed by me and considered in my medical decision making (see chart for details).    MDM Rules/Calculators/A&P                           Mechanical fall with right shoulder and knee pain.  No head injury.  Patient concerned given her history of previous joint replacements.  X-ray shows intact hardware of right shoulder and right knee.  There is a lateral right scapular fracture near the coracoid process that is nondisplaced.  Discussed with Dr. Lequita Halt on-call for Dr. Rennis Chris. He recommends immobilization with sling and follow-up.  Nonweightbearing until she sees Dr. Rennis Chris in the office.  No specific instructions given that her arthroplasty hardware appears intact.  Patient initial blood pressure was elevated. Patient states she has whitecoat syndrome and has not missed any of her blood pressure medications.  We will recheck.  Nonweightbearing on right arm until she sees Dr. Rennis Chris.  Continue to take her chronic pain medication. Blood pressure improved to 180/90. Return to the ED with worsening pain, weakness, numbness, tingling, any other concerns Final Clinical Impression(s) / ED Diagnoses Final diagnoses:  Fall, initial encounter  Closed fracture of right scapula, unspecified part of scapula, initial encounter    Rx / DC Orders ED Discharge Orders     None        Cydnie Deason, Jeannett Senior, MD 01/20/21 0401

## 2021-01-20 NOTE — Discharge Instructions (Signed)
Keep the right shoulder immobilized and do not use the right arm until cleared by Dr. Rennis Chris.  Take your pain medication as prescribed.  Return to the ED if develop worsening pain, weakness, numbness, tingling, or any other concerns

## 2021-01-20 NOTE — ED Triage Notes (Addendum)
Patient stumbled over her bedroom shoes and fell onto the floor onto her right arm. Patient complaining of right shoulder and humerus pain. Patient states she took her vicodin that she is prescribed for arthritis. MD at bedside

## 2021-02-02 ENCOUNTER — Telehealth: Payer: Self-pay | Admitting: Physician Assistant

## 2021-02-02 DIAGNOSIS — L02419 Cutaneous abscess of limb, unspecified: Secondary | ICD-10-CM

## 2021-02-02 MED ORDER — DOXYCYCLINE HYCLATE 100 MG PO TABS: 100.0000 mg | ORAL_TABLET | Freq: Two times a day (BID) | ORAL | 0 refills | Status: DC

## 2021-02-02 NOTE — Telephone Encounter (Signed)
Patient states she is experiencing pain and redness in her legs. She states she has had this issue before and we dx her with Cellulitis, treated with antibiotics and that the issue went away.   Patient states she is unable to make it to an appointment today due to her recent fall and broken shoulder. She is requesting an antibiotic be sent to [facility] for treatment of Cellulitis.    Per Kandis Cocking sending Doxycyline 100mg  1 Po BID x 10 day and patient is to follow up by Friday if redness and pain have not subsided. Pt was agreeable to this and scheduled an apt for Friday morning. AS, CMA

## 2021-02-04 ENCOUNTER — Ambulatory Visit: Payer: Medicare Other | Admitting: Physician Assistant

## 2021-02-09 ENCOUNTER — Ambulatory Visit: Payer: Medicare Other | Admitting: Physician Assistant

## 2021-03-23 ENCOUNTER — Ambulatory Visit
Admission: EM | Admit: 2021-03-23 | Discharge: 2021-03-23 | Disposition: A | Discharge status: Home / Self Care | Payer: Medicare Other | Attending: Internal Medicine | Admitting: Internal Medicine

## 2021-03-23 ENCOUNTER — Other Ambulatory Visit (INDEPENDENT_AMBULATORY_CARE_PROVIDER_SITE_OTHER): Payer: Medicare Other

## 2021-03-23 ENCOUNTER — Other Ambulatory Visit: Payer: Self-pay

## 2021-03-23 VITALS — BP 185/80 | HR 87 | Temp 98.1°F | Ht 64.0 in | Wt 216.2 lb

## 2021-03-23 DIAGNOSIS — N3001 Acute cystitis with hematuria: Secondary | ICD-10-CM

## 2021-03-23 DIAGNOSIS — R319 Hematuria, unspecified: Secondary | ICD-10-CM | POA: Diagnosis not present

## 2021-03-23 DIAGNOSIS — N390 Urinary tract infection, site not specified: Secondary | ICD-10-CM

## 2021-03-23 LAB — POCT URINALYSIS DIP (MANUAL ENTRY)
Bilirubin, UA: NEGATIVE
Glucose, UA: 100 mg/dL — AB
Nitrite, UA: POSITIVE — AB
Protein Ur, POC: NEGATIVE mg/dL
Spec Grav, UA: 1.025 (ref 1.010–1.025)
Urobilinogen, UA: 1 E.U./dL
pH, UA: 6 (ref 5.0–8.0)

## 2021-03-23 LAB — POCT URINALYSIS DIP (CLINITEK)
Bilirubin, UA: NEGATIVE
Blood, UA: NEGATIVE
Glucose, UA: 100 mg/dL — AB
Nitrite, UA: POSITIVE — AB
POC PROTEIN,UA: 30 — AB
Spec Grav, UA: <=1.005 — AB (ref 1.010–1.025)
Urobilinogen, UA: 2 E.U./dL — AB
pH, UA: 5 (ref 5.0–8.0)

## 2021-03-23 MED ORDER — NITROFURANTOIN MONOHYD MACRO 100 MG PO CAPS: 100.0000 mg | ORAL_CAPSULE | Freq: Two times a day (BID) | ORAL | 0 refills | Status: DC

## 2021-03-23 NOTE — Discharge Instructions (Addendum)
Your urine is showing signs of urinary tract infection.  You are being treated with Macrobid antibiotic.  Please go to the hospital if no improvement in symptoms in the next 24 to 48 hours.  Also please take your blood pressure medication and monitor blood pressure at home.  Follow-up with PCP if blood pressure remains elevated.

## 2021-03-23 NOTE — ED Triage Notes (Signed)
Pt c/o confusion, concerned for uti b/c she "feels pressure." Associated frequency, dysuria,   Denies oliguria,   Onset yesterday

## 2021-03-23 NOTE — ED Provider Notes (Signed)
[facility]    CSN: [id number] Arrival date & time: 03/23/21  1009      History   Chief Complaint Chief Complaint  Patient presents with   Altered Mental Status    HPI Tracey Walton is a 80 y.o. female.   Patient presents with confusion, urinary burning, urinary urgency, bladder pressure that started approximately 1 week ago.  Patient and caregiver deny any known fevers, back pain, abdominal pain, vaginal discharge, irregular vaginal bleeding, hematuria.  Patient reports that she has had multiple recurrent urinary tract infections.  Caregiver reports that she has never had confusion with urinary tract infection prior to this.  Caregiver reports that she does not know what day it is.  Today is Wednesday, and patient thinks that it is Monday.  She also has elevated blood pressure reading in urgent care today.  Caregiver and patient report that she has whitecoat syndrome and it is always significantly elevated at doctor's visits.  This is consistent with previous vital signs.  Patient also reports that she forgot to take her blood pressure medication this morning.  Denies chest pain, shortness of breath, nausea, vomiting, blurred vision, dizziness.   Altered Mental Status  Past Medical History:  Diagnosis Date   Anxiety    Arthritis    "qwhere" (08/09/2017)   Chest pain 2011   negative for MI   Chronic kidney disease    renal insufficiency. 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 down after her knee surgery back in 2008   Chronic lower back pain    Depression    Dyspnea    SOB while walking   GERD (gastroesophageal reflux disease)    Heart murmur    History of hiatal hernia    Hyperlipidemia    Hypertension    Osteoporosis    Pneumonia ~ 2014   Polymyalgia (HCC)    Strep throat     Patient Active Problem List   Diagnosis Date Noted   Cellulitis and abscess of lower extremity 10/31/2020   Bilateral lower extremity edema 10/31/2020   Hyponatremia 06/13/2020    Recurrent UTI (urinary tract infection) 06/07/2020   Hematuria 06/07/2020   Falls frequently 06/07/2020   Acute non-recurrent pansinusitis 05/31/2020   Urinary tract infection with hematuria 05/31/2020   Dysuria 05/31/2020   Essential hypertension 03/24/2020   Dizziness 05/12/2019   Fluid level behind tympanic membrane of both ears 05/12/2019   Porokeratosis 03/26/2019   Leg pain 01/10/2018   Depression 11/07/2017   Healthcare maintenance 11/07/2017   Dyspnea on exertion 11/07/2017   Obesity with body mass index of 30.0-39.9 11/07/2017   History of reverse total replacement of right shoulder joint 08/24/2017   Long-term current use of opiate analgesic 07/25/2017   Rotator cuff tear arthropathy 05/25/2017   Pain in joint of right shoulder 05/25/2017   S/p reverse total shoulder arthroplasty 10/12/2016   Mild aortic stenosis 07/06/2014   Hypertension 05/29/2014   Chest discomfort 05/29/2014   Hyperlipidemia 05/29/2014   Polymyalgia (HCC) 01/04/2013   Chronic low back pain 09/30/2012   History of total knee replacement, right 06/22/2009   History of total knee replacement, left 03/06/2002    Past Surgical History:  Procedure Laterality Date   ABDOMINAL HYSTERECTOMY     partial   BILATERAL CARPAL TUNNEL RELEASE  2003-2005   right-left   CATARACT EXTRACTION W/ INTRAOCULAR LENS  IMPLANT, BILATERAL Bilateral    JOINT REPLACEMENT     REVERSE SHOULDER ARTHROPLASTY Left 10/12/2016   Procedure: REVERSE LEFT  SHOULDER ARTHROPLASTY;  Surgeon: Justice Britain, MD;  Location: [place];  Service: Orthopedics;  Laterality: Left;   REVERSE SHOULDER ARTHROPLASTY Right 08/09/2017   Procedure: RIGHT REVERSE SHOULDER ARTHROPLASTY;  Surgeon: Justice Britain, MD;  Location: [place];  Service: Orthopedics;  Laterality: Right;   SHOULDER ARTHROSCOPY Right 2003   TOOTH EXTRACTION     "1 tooth" (08/09/2017)   TOTAL KNEE ARTHROPLASTY Bilateral     OB History   No obstetric history on file.      Home  Medications    Prior to Admission medications   Medication Sig Start Date End Date Taking? Authorizing Provider  nitrofurantoin, macrocrystal-monohydrate, (MACROBID) 100 MG capsule Take 1 capsule (100 mg total) by mouth 2 (two) times daily. 03/23/21  Yes Madison Direnzo, Hildred Alamin E, FNP  acetaminophen (TYLENOL) 325 MG tablet Take 650 mg by mouth 4 (four) times daily as needed (for pain.).    [provider]  amLODipine (NORVASC) 2.5 MG tablet TAKE 1 TABLET BY MOUTH IN  THE EVENING 11/30/20   Nahser, Wonda Cheng, MD  Baclofen 5 MG TABS Take 1 tablet by mouth 2 (two) times daily as needed. 09/29/20   [provider]  carvedilol (COREG) 6.25 MG tablet TAKE 1 TABLET BY MOUTH  TWICE DAILY 11/30/20   Nahser, Wonda Cheng, MD  cetirizine (ZYRTEC) 10 MG tablet Take 10 mg by mouth daily.    [provider]  Coenzyme Q10 100 MG capsule Take 200 mg by mouth daily.    [provider]  doxycycline (VIBRA-TABS) 100 MG tablet Take 1 tablet (100 mg total) by mouth 2 (two) times daily. 02/02/21   Lorrene Reid, PA-C  ferrous sulfate 325 (65 FE) MG tablet Take 1 tablet (325 mg total) by mouth daily with breakfast. Take on Monday, Wednesday, Fridays 03/14/18   Danford, Valetta Fuller D, NP  fluticasone (FLONASE) 50 MCG/ACT nasal spray Place 2 sprays into both nostrils daily. 05/28/20   Lorrene Reid, PA-C  gabapentin (NEURONTIN) 100 MG capsule gabapentin 100 mg capsule  Take 1 capsule twice a day by oral route.  May cause drowsiness    [provider]  hydrochlorothiazide (HYDRODIURIL) 25 MG tablet TAKE 1 TABLET BY MOUTH  DAILY 11/30/20   Nahser, Wonda Cheng, MD  HYDROcodone-acetaminophen (NORCO/VICODIN) 5-325 MG tablet Take 1 tablet by mouth every 6 (six) hours as needed for moderate pain or severe pain.     [provider]  magic mouthwash (nystatin, lidocaine, diphenhydrAMINE) suspension Take 5 mLs by mouth 3 (three) times daily as needed for mouth pain. 11/10/20   Lorrene Reid, PA-C   methocarbamol (ROBAXIN) 500 MG tablet methocarbamol 500 mg tablet  Take 1 tablet twice a day by oral route as needed.    [provider]  naproxen (NAPROSYN) 500 MG tablet TAKE 1 TABLET BY MOUTH  TWICE DAILY WITH A MEAL 01/18/21   Abonza, Maritza, PA-C  omeprazole (PRILOSEC) 20 MG capsule TAKE 1 CAPSULE BY MOUTH  DAILY 01/18/21   Abonza, Maritza, PA-C  ondansetron (ZOFRAN) 4 MG tablet Take 1 tablet (4 mg total) by mouth every 8 (eight) hours as needed for nausea or vomiting. 08/06/20   Lorrene Reid, PA-C  Polyethyl Glycol-Propyl Glycol 0.4-0.3 % SOLN Place 1 drop into both eyes 3 (three) times daily as needed (for dry/irritated eyes.).    [provider]  rosuvastatin (CRESTOR) 20 MG tablet Take 1 tablet (20 mg total) by mouth daily. 06/17/20   Nahser, Wonda Cheng, MD  sertraline (ZOLOFT) 25 MG tablet Take  1 tablet (25 mg total) by mouth daily. 11/10/20   Lorrene Reid, PA-C  sulfamethoxazole-trimethoprim (BACTRIM DS) 800-160 MG tablet Take 1 tablet by mouth 2 (two) times daily. 10/15/20   Ronnell Freshwater, NP    Family History Family History  Problem Relation Age of Onset   Heart attack Mother    Heart disease Mother    Hypertension Father    Heart failure Father    Kidney failure Father    Hyperlipidemia Father    Stroke Father    Hypertension Sister    Hyperlipidemia Sister     Social History Social History   Tobacco Use   Smoking status: Never   Smokeless tobacco: Never  Vaping Use   Vaping Use: Never used  Substance Use Topics   Alcohol use: Never   Drug use: Never     Allergies   Penicillins   Review of Systems Review of Systems Per HPI  Physical Exam Triage Vital Signs ED Triage Vitals [03/23/21 1023]  Enc Vitals Group     BP (!) 183/105     Pulse Rate 88     Resp 18     Temp 98.9 F (37.2 C)     Temp Source Oral     SpO2 94 %     Weight      Height      Head Circumference      Peak Flow      Pain Score 0     Pain Loc      Pain  Edu?      Excl. in [place]?    No data found.  Updated Vital Signs BP (!) 183/105 (BP Location: Left Arm)    Pulse 88    Temp 98.9 F (37.2 C) (Oral)    Resp 18    SpO2 94%   Visual Acuity Right Eye Distance:   Left Eye Distance:   Bilateral Distance:    Right Eye Near:   Left Eye Near:    Bilateral Near:     Physical Exam Constitutional:      General: She is not in acute distress.    Appearance: Normal appearance. She is not toxic-appearing or diaphoretic.  HENT:     Head: Normocephalic and atraumatic.  Eyes:     Extraocular Movements: Extraocular movements intact.     Conjunctiva/sclera: Conjunctivae normal.  Cardiovascular:     Rate and Rhythm: Normal rate and regular rhythm.     Pulses: Normal pulses.     Heart sounds: Normal heart sounds.  Pulmonary:     Effort: Pulmonary effort is normal. No respiratory distress.     Breath sounds: Normal breath sounds.  Abdominal:     General: Bowel sounds are normal. There is no distension.     Palpations: Abdomen is soft.     Tenderness: There is no abdominal tenderness.  Neurological:     General: No focal deficit present.     Mental Status: She is alert and oriented to person, place, and time. Mental status is at baseline.     Cranial Nerves: Cranial nerves 2-12 are intact.     Sensory: Sensation is intact.     Motor: Motor function is intact.     Comments: Patient not oriented to date.  Patient walks with walker at baseline.  Psychiatric:        Mood and Affect: Mood normal.        Behavior: Behavior normal.  Thought Content: Thought content normal.        Judgment: Judgment normal.     UC Treatments / Results  Labs (all labs ordered are listed, but only abnormal results are displayed) Labs Reviewed  POCT URINALYSIS DIP (MANUAL ENTRY) - Abnormal; Notable for the following components:      Result Value   Color, UA orange (*)    Glucose, UA =100 (*)    Ketones, POC UA trace (5) (*)    Blood, UA trace-intact (*)     Nitrite, UA Positive (*)    Leukocytes, UA Trace (*)    All other components within normal limits  URINE CULTURE    EKG   Radiology No results found.  Procedures Procedures (including critical care time)  Medications Ordered in UC Medications - No data to display  Initial Impression / Assessment and Plan / UC Course  I have reviewed the triage vital signs and the nursing notes.  Pertinent labs & imaging results that were available during my care of the patient were reviewed by me and considered in my medical decision making (see chart for details).     Urinalysis is showing signs of urinary tract infection.  Will treat with Macrobid antibiotic as previous visits to PCP indicate that patient has been treated with Macrobid a few months prior.  Urine culture is pending.  Confusion is consistent with urinary tract infections in the elderly, although patient has never had confusion with urinary tract infection before, so advised patient and caregiver to go to the hospital if no improvement in symptoms with antibiotic therapy in the next 24 to 48 hours. Otherwise neuro exam is normal and at baseline. Patient and caregiver voiced understanding.  Patient has elevated blood pressure reading but this is consistent with previous doctor's visits as this patient reports that she has whitecoat syndrome.  Patient has also not taken her blood pressure medication today.  Patient advised to restart her blood pressure medication at prescribed dose as soon as possible.  Patient to monitor blood pressure at home with home cuff.  Discussed strict return precautions.  Patient and caregiver voiced understanding and were agreeable with plan. Final Clinical Impressions(s) / UC Diagnoses   Final diagnoses:  Acute cystitis with hematuria     Discharge Instructions      Your urine is showing signs of urinary tract infection.  You are being treated with Macrobid antibiotic.  Please go to the hospital if no  improvement in symptoms in the next 24 to 48 hours.  Also please take your blood pressure medication and monitor blood pressure at home.  Follow-up with PCP if blood pressure remains elevated.    ED Prescriptions     Medication Sig Dispense Auth. Provider   nitrofurantoin, macrocrystal-monohydrate, (MACROBID) 100 MG capsule Take 1 capsule (100 mg total) by mouth 2 (two) times daily. 10 capsule Teodora Medici, [place]      PDMP not reviewed this encounter.   Teodora Medici, [place] 03/23/21 1114

## 2021-03-25 LAB — URINE CULTURE: Culture: 40000 — AB

## 2021-03-29 ENCOUNTER — Other Ambulatory Visit: Payer: Self-pay

## 2021-03-29 ENCOUNTER — Telehealth: Payer: Self-pay | Admitting: Physician Assistant

## 2021-03-29 DIAGNOSIS — N390 Urinary tract infection, site not specified: Secondary | ICD-10-CM

## 2021-03-29 LAB — URINE CULTURE

## 2021-03-29 MED ORDER — CIPROFLOXACIN HCL 500 MG PO TABS: 500.0000 mg | ORAL_TABLET | Freq: Two times a day (BID) | ORAL | 0 refills | Status: AC

## 2021-03-29 NOTE — Telephone Encounter (Signed)
Patient was seen last week for uti and called stating she is still having a lot of pressure and doesn't think it has cleared up. Wants to know if something else can be called in or what does she need to do? [phone]

## 2021-03-29 NOTE — Telephone Encounter (Signed)
Patient is notified.

## 2021-04-12 ENCOUNTER — Other Ambulatory Visit: Payer: Self-pay | Admitting: Physician Assistant

## 2021-04-12 DIAGNOSIS — E785 Hyperlipidemia, unspecified: Secondary | ICD-10-CM

## 2021-04-12 DIAGNOSIS — I10 Essential (primary) hypertension: Secondary | ICD-10-CM

## 2021-04-20 ENCOUNTER — Other Ambulatory Visit: Payer: Self-pay | Admitting: Physician Assistant

## 2021-05-10 ENCOUNTER — Other Ambulatory Visit: Payer: Self-pay | Admitting: Cardiovascular Disease

## 2021-07-06 ENCOUNTER — Encounter: Payer: Self-pay | Admitting: Cardiovascular Disease

## 2021-07-06 ENCOUNTER — Ambulatory Visit: Payer: Medicare Other | Admitting: Cardiovascular Disease

## 2021-07-06 VITALS — BP 132/68 | HR 66 | Ht 64.0 in | Wt 217.2 lb

## 2021-07-06 DIAGNOSIS — I10 Essential (primary) hypertension: Secondary | ICD-10-CM

## 2021-07-06 DIAGNOSIS — E782 Mixed hyperlipidemia: Secondary | ICD-10-CM | POA: Diagnosis not present

## 2021-07-06 NOTE — Patient Instructions (Signed)
Medication Instructions:  ?Your physician recommends that you continue on your current medications as directed. Please refer to the Current Medication list given to you today. ? ?*If you need a refill on your cardiac medications before your next appointment, please call your pharmacy* ? ?Lab Work: ?TODAY: Lipids, ALT, BMP ?If you have labs (blood work) drawn today and your tests are completely normal, you will receive your results only by: ?MyChart Message (if you have MyChart) OR ?A paper copy in the mail ?If you have any lab test that is abnormal or we need to change your treatment, we will call you to review the results. ? ?Testing/Procedures: ?NONE ? ?Follow-Up: ?At [facility], you and your health needs are our priority.  As part of our continuing mission to provide you with exceptional heart care, we have created designated Provider Care Teams.  These Care Teams include your primary Cardiologist (physician) and Advanced Practice Providers (APPs -  Physician Assistants and Nurse Practitioners) who all work together to provide you with the care you need, when you need it. ? ?Your next appointment:   ?1 year(s) ? ?The format for your next appointment:   ?In Person ? ?Provider:   ?Mertie Moores, MD  or Robbie Lis, PA-C or Richardson Dopp, [place]      ? ?Other Instructions ?For your  leg edema you  should do  the following ?1. Leg elevation - I recommend the Lounge Dr. Leg rest.  See below for details  ?2. Salt restriction  -  Use potassium chloride instead of regular salt as a salt substitute. ?3. Walk regularly ?4. Compression hose - Medical Supply store  ?5. Weight loss  ? ? ?Available on [email or web address] ?Or  ?Go to [email or web address] ? ? ? ? ?

## 2021-07-06 NOTE — Progress Notes (Signed)
? ?Cardiology Office Note ? ? ?Date:  07/06/2021  ? ?ID:  Tracey Walton, DOB 1941/01/12, MRN [id number] ? ?PCP:  Tracey Masker, PA-C  ?Cardiologist:   Kristeen Miss, MD  ? ?Chief Complaint  ?Patient presents with  ? Aortic Stenosis  ? Hyperlipidemia  ? ?Problem List ?1. Essential HTN  ?2. Chest pressure ?3. Arthritis ?4. Heart murmur ?5. Hyperlipidemia ?6. Hiatal hernia  ?  ?Previous notes.  ?Tracey Walton is a 81 y.o. female who presents for further evaluation of her HTN ?Has had HTN for many years.  Her medical doctors at the no longer [facility] have had difficulty getting her blood pressure controlled. ?She has occasional episodes of chest pressure. These chest pain  Tend occur with exertion and are relieved with rest. They last for several minutes. There is no radiation.  She has chronic shortness of breath but the chest pains are not necessarily associated with worsening shortness breath. ? ?She does not get any regular exercise. She tries to avoid eating excessive salt but admits that she still eats occasional salty foods. ? ?She's had hyperlipidemia for the past 7 or 8 years. She is currently on Pravachol. ? ?June 24, 2014: ?  ?[name] presents follow up of her HTN, and CP.  She was scheduled for an echo and myoview ? ?Echo showed: normal LV systolic function. She has mild aortic stenosis, trivial aortic insufficiency, and mild mitral regurgitation. ? ?Left ventricle: The cavity size was normal. Systolic function was ?  normal. The estimated ejection fraction was in the range of 55% ?  to 60%. Wall motion was normal; there were no regional wall ?  motion abnormalities. Doppler parameters are consistent with ?  abnormal left ventricular relaxation (grade 1 diastolic ?  dysfunction). ?- Aortic valve: Cusp separation was reduced. There was very mild ?  stenosis. There was trivial regurgitation. Mean gradient (S): 11 ?  mm Hg. ?- Mitral valve: There was mild regurgitation ? ?She had a [employer]  today.  Initial images look good. No evidence of ischemia ? ?Has been limiting the salt in her diet.   ? ?June 8.  2016: ? ?Tracey Walton is doing well. ?BP is well controlled at home.  ? ?Dec. 9, 2016 ? ?Doing well.  BP is normal at home.  Is always elevated at the office.  ?No CP,  Has some DOE with walking .  Does not walk regularly .  ? ?October 01, 2015: ?Doing well. ?BP is normal at home.   Always elevated in the doctors office. ?Does not exercise.   Back pain.   Needs to use a walker if she goes for any distance.   Has pain in her back and hands.  ? ?May 14, 2019:  ?Tracey Walton seen today for a follow-up visit.  She has a history of mild aortic stenosis, hypertension, hyperlipidemia. ?BP at home are better.  ?She watches her salt ? ?Uses a cane to walk , has some exertional tightness.   Lexiscan Myoview study performed September, 2019 revealed no ischemia.  Her echocardiogram showed some aortic stenosis that perhaps was slightly worse than previous. ?Some DOE  ? ?June 17, 2020: ?Tracey Walton is seen today for follow-up visit.  She has aortic stenosis, hypertension, hyperlipidemia. ?Echo from Feb, 2021 showed normal LV function , grade 1 DD, ?Mild - mod MR , mild AI, mild AS  ? ?She has had some episodes of hyponatremia.  Her HCTZ was stopped and since that time she has had  some worsening leg edema.  She has chronic leg edema.  She never elevates her legs.  She sleeps in a recliner and walks with a walker or a cane. ?Past Medical History:  ?Diagnosis Date  ? Anxiety   ? Arthritis   ? "qwhere" (08/09/2017)  ? Chest pain 2011  ? negative for MI  ? Chronic kidney disease   ? renal insufficiency. 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 down after her knee surgery back in 2008  ? Chronic lower back pain   ? Depression   ? Dyspnea   ? SOB while walking  ? GERD (gastroesophageal reflux disease)   ? Heart murmur   ? History of hiatal hernia   ? Hyperlipidemia   ? Hypertension   ? Osteoporosis   ? Pneumonia ~ 2014  ? Polymyalgia (HCC)   ? Strep  throat   ? ? ?Past Surgical History:  ?Procedure Laterality Date  ? ABDOMINAL HYSTERECTOMY    ? partial  ? BILATERAL CARPAL TUNNEL RELEASE  2003-2005  ? right-left  ? CATARACT EXTRACTION W/ INTRAOCULAR LENS  IMPLANT, BILATERAL Bilateral   ? JOINT REPLACEMENT    ? REVERSE SHOULDER ARTHROPLASTY Left 10/12/2016  ? Procedure: REVERSE LEFT SHOULDER ARTHROPLASTY;  Surgeon: Francena Hanly, MD;  Location: [facility];  Service: Orthopedics;  Laterality: Left;  ? REVERSE SHOULDER ARTHROPLASTY Right 08/09/2017  ? Procedure: RIGHT REVERSE SHOULDER ARTHROPLASTY;  Surgeon: Francena Hanly, MD;  Location: [facility];  Service: Orthopedics;  Laterality: Right;  ? SHOULDER ARTHROSCOPY Right 2003  ? TOOTH EXTRACTION    ? "1 tooth" (08/09/2017)  ? TOTAL KNEE ARTHROPLASTY Bilateral   ? ? ? ?Current Outpatient Medications  ?Medication Sig Dispense Refill  ? acetaminophen (TYLENOL) 325 MG tablet Take 650 mg by mouth 4 (four) times daily as needed (for pain.).    ? amLODipine (NORVASC) 2.5 MG tablet TAKE 1 TABLET BY MOUTH IN  THE EVENING 90 tablet 2  ? Baclofen 5 MG TABS Take 1 tablet by mouth 2 (two) times daily as needed.    ? carvedilol (COREG) 6.25 MG tablet TAKE 1 TABLET BY MOUTH  TWICE DAILY 180 tablet 2  ? cetirizine (ZYRTEC) 10 MG tablet Take 10 mg by mouth daily.    ? Coenzyme Q10 100 MG capsule Take 200 mg by mouth daily.    ? estradiol (ESTRACE) 0.1 MG/GM vaginal cream Place vaginally.    ? ferrous sulfate 325 (65 FE) MG tablet Take 1 tablet (325 mg total) by mouth daily with breakfast. Take on Monday, Wednesday, Fridays 90 tablet 1  ? FLUoxetine (PROZAC) 20 MG capsule Take 20 mg by mouth daily.    ? fluticasone (FLONASE) 50 MCG/ACT nasal spray Place 2 sprays into both nostrils daily. 48 g 2  ? gabapentin (NEURONTIN) 100 MG capsule gabapentin 100 mg capsule ? Take 1 capsule twice a day by oral route. ? May cause drowsiness    ? hydrochlorothiazide (HYDRODIURIL) 25 MG tablet TAKE 1 TABLET BY MOUTH  DAILY 90 tablet 2  ? HYDROcodone-acetaminophen  (NORCO/VICODIN) 5-325 MG tablet Take 1 tablet by mouth every 6 (six) hours as needed for moderate pain or severe pain.     ? magic mouthwash (nystatin, lidocaine, diphenhydrAMINE) suspension Take 5 mLs by mouth 3 (three) times daily as needed for mouth pain. 180 mL 0  ? methocarbamol (ROBAXIN) 500 MG tablet methocarbamol 500 mg tablet ? Take 1 tablet twice a day by oral route as needed.    ? naproxen (NAPROSYN) 500 MG tablet  TAKE 1 TABLET BY MOUTH TWICE  DAILY WITH MEALS 180 tablet 0  ? omeprazole (PRILOSEC) 20 MG capsule TAKE 1 CAPSULE BY MOUTH  DAILY 90 capsule 0  ? ondansetron (ZOFRAN) 4 MG tablet Take 1 tablet (4 mg total) by mouth every 8 (eight) hours as needed for nausea or vomiting. 20 tablet 0  ? Polyethyl Glycol-Propyl Glycol 0.4-0.3 % SOLN Place 1 drop into both eyes 3 (three) times daily as needed (for dry/irritated eyes.).    ? rosuvastatin (CRESTOR) 20 MG tablet Take 1 tablet (20 mg total) by mouth daily. Please make yearly appt with Dr. Elease HashimotoNahser for March 2023 for future refills. Thank you 1st attempt 90 tablet 0  ? doxycycline (VIBRA-TABS) 100 MG tablet Take 1 tablet (100 mg total) by mouth 2 (two) times daily. (Patient not taking: Reported on 07/06/2021) 20 tablet 0  ? nitrofurantoin, macrocrystal-monohydrate, (MACROBID) 100 MG capsule Take 1 capsule (100 mg total) by mouth 2 (two) times daily. (Patient not taking: Reported on 07/06/2021) 10 capsule 0  ? predniSONE (DELTASONE) 10 MG tablet Take 10 mg by mouth.    ? sulfamethoxazole-trimethoprim (BACTRIM DS) 800-160 MG tablet Take 1 tablet by mouth 2 (two) times daily. (Patient not taking: Reported on 07/06/2021) 20 tablet 0  ? ?No current facility-administered medications for this visit.  ? ? ?Allergies:   Penicillins  ? ? ?Social History:  The patient  reports that she has never smoked. She has never used smokeless tobacco. She reports that she does not drink alcohol and does not use drugs.  ? ?Family History:  The patient's family history includes Heart  attack in her mother; Heart disease in her mother; Heart failure in her father; Hyperlipidemia in her father and sister; Hypertension in her father and sister; Kidney failure in her father; Stroke in her father

## 2021-07-07 ENCOUNTER — Other Ambulatory Visit: Payer: Self-pay | Admitting: Physician Assistant

## 2021-07-07 DIAGNOSIS — E785 Hyperlipidemia, unspecified: Secondary | ICD-10-CM

## 2021-07-07 DIAGNOSIS — I10 Essential (primary) hypertension: Secondary | ICD-10-CM

## 2021-07-08 LAB — BASIC METABOLIC PANEL
BUN/Creatinine Ratio: 20 (ref 12–28)
BUN: 24 mg/dL (ref 8–27)
CO2: 24 mmol/L (ref 20–29)
Calcium: 9.6 mg/dL (ref 8.7–10.3)
Chloride: 102 mmol/L (ref 96–106)
Creatinine, Ser: 1.2 mg/dL — ABNORMAL HIGH (ref 0.57–1.00)
Glucose: 97 mg/dL (ref 70–99)
Potassium: 5.5 mmol/L — ABNORMAL HIGH (ref 3.5–5.2)
Sodium: 142 mmol/L (ref 134–144)
eGFR: 46 mL/min/{1.73_m2} — ABNORMAL LOW (ref >59)

## 2021-07-08 LAB — LIPID PANEL
Chol/HDL Ratio: 3.3 ratio (ref 0.0–4.4)
Cholesterol, Total: 147 mg/dL (ref 100–199)
HDL: 45 mg/dL (ref >39)
LDL Chol Calc (NIH): 87 mg/dL (ref 0–99)
Triglycerides: 77 mg/dL (ref 0–149)
VLDL Cholesterol Cal: 15 mg/dL (ref 5–40)

## 2021-07-08 LAB — ALT: ALT: 11 IU/L (ref 0–32)

## 2021-08-03 ENCOUNTER — Other Ambulatory Visit: Payer: Self-pay | Admitting: Cardiovascular Disease

## 2021-08-04 ENCOUNTER — Other Ambulatory Visit: Payer: Self-pay | Admitting: Cardiovascular Disease

## 2021-08-04 DIAGNOSIS — I10 Essential (primary) hypertension: Secondary | ICD-10-CM

## 2021-08-04 DIAGNOSIS — E782 Mixed hyperlipidemia: Secondary | ICD-10-CM

## 2021-09-26 ENCOUNTER — Other Ambulatory Visit: Payer: Self-pay | Admitting: Physician Assistant

## 2021-09-26 DIAGNOSIS — E785 Hyperlipidemia, unspecified: Secondary | ICD-10-CM

## 2021-09-26 DIAGNOSIS — I10 Essential (primary) hypertension: Secondary | ICD-10-CM

## 2021-11-09 ENCOUNTER — Encounter (INDEPENDENT_AMBULATORY_CARE_PROVIDER_SITE_OTHER): Payer: Self-pay

## 2021-12-12 ENCOUNTER — Telehealth: Payer: Self-pay

## 2021-12-12 NOTE — Telephone Encounter (Signed)
Patient called to get a refill on her PROZAC, please send script to patients pharmacy, thanks!

## 2021-12-20 ENCOUNTER — Other Ambulatory Visit: Payer: Self-pay | Admitting: Physician Assistant

## 2021-12-20 DIAGNOSIS — E785 Hyperlipidemia, unspecified: Secondary | ICD-10-CM

## 2021-12-20 DIAGNOSIS — I10 Essential (primary) hypertension: Secondary | ICD-10-CM

## 2021-12-21 ENCOUNTER — Telehealth: Payer: Self-pay | Admitting: Physician Assistant

## 2021-12-21 NOTE — Telephone Encounter (Signed)
Patient aware, appt made

## 2021-12-21 NOTE — Telephone Encounter (Signed)
Patient requesting refill of Prozac. Please advise.  

## 2021-12-27 ENCOUNTER — Ambulatory Visit (INDEPENDENT_AMBULATORY_CARE_PROVIDER_SITE_OTHER): Payer: Medicare Other | Admitting: Physician Assistant

## 2021-12-27 ENCOUNTER — Encounter: Payer: Self-pay | Admitting: Physician Assistant

## 2021-12-27 VITALS — BP 140/80 | HR 62 | Temp 97.9°F | Ht 63.0 in | Wt 214.0 lb

## 2021-12-27 DIAGNOSIS — J309 Allergic rhinitis, unspecified: Secondary | ICD-10-CM | POA: Diagnosis not present

## 2021-12-27 DIAGNOSIS — F32A Depression, unspecified: Secondary | ICD-10-CM | POA: Diagnosis not present

## 2021-12-27 DIAGNOSIS — I10 Essential (primary) hypertension: Secondary | ICD-10-CM | POA: Diagnosis not present

## 2021-12-27 DIAGNOSIS — E782 Mixed hyperlipidemia: Secondary | ICD-10-CM | POA: Diagnosis not present

## 2021-12-27 DIAGNOSIS — R21 Rash and other nonspecific skin eruption: Secondary | ICD-10-CM

## 2021-12-27 MED ORDER — FLUOXETINE HCL 20 MG PO CAPS: 20.0000 mg | ORAL_CAPSULE | Freq: Every day | ORAL | 0 refills | Status: DC

## 2021-12-27 MED ORDER — TRIAMCINOLONE ACETONIDE 0.1 % EX CREA: 1.0000 | TOPICAL_CREAM | Freq: Two times a day (BID) | CUTANEOUS | 0 refills | Status: DC

## 2021-12-27 MED ORDER — FLUTICASONE PROPIONATE 50 MCG/ACT NA SUSP: 2.0000 | Freq: Every day | NASAL | 2 refills | Status: DC

## 2021-12-27 MED ORDER — FLUOXETINE HCL 20 MG PO CAPS: 20.0000 mg | ORAL_CAPSULE | Freq: Every day | ORAL | 1 refills | Status: DC

## 2021-12-27 NOTE — Assessment & Plan Note (Addendum)
-  Followed by cardiology. Last lipid panel normal, LDL 87. Continue current medication regimen. Recommend repeating lipid panel and hepatic function with medicare wellness lab visit.

## 2021-12-27 NOTE — Patient Instructions (Signed)

## 2021-12-27 NOTE — Progress Notes (Signed)
Established patient visit   Patient: Tracey Walton   DOB: [birth year]   81 y.o. Female  MRN: [id number] Visit Date: 12/27/2021  Chief Complaint  Patient presents with   Follow-up   Subjective    HPI  Patient presents for follow-up on mood. Patient reports never got the rx for Zoloft from the mail pharmacy so has continued with Prozac 20 mg. Reports medication has been ok and her mood has been stable. Also requesting refill of Flonase which helps with nasal congestion and runny nose related to allergies. Also reports intermittent red, itchy rash at her abdomen.   HTN: Pt denies chest pain, palpitations, syncope or severe lower extremity swelling. Patient reports took a dose of Gabapentin which has caused some dizziness, she limits the use of medication because of that side effect.           12/27/2021    1:22 PM 12/27/2020   11:59 AM 12/27/2020   11:54 AM 11/10/2020    9:36 AM 10/27/2020    1:22 PM  Depression screen PHQ 2/9  Decreased Interest 0 0 0 0 0  Down, Depressed, Hopeless 1 0 0 0 0  PHQ - 2 Score 1 0 0 0 0  Altered sleeping 1 0 0 0 0  Tired, decreased energy 1 0 0 0 2  Change in appetite 1 0 0 0 2  Feeling bad or failure about yourself  0 0 0 0 0  Trouble concentrating 1 0 0 0 0  Moving slowly or fidgety/restless 1 0 0 0 2  Suicidal thoughts 0 0 0 0 0  PHQ-9 Score 6 0 0 0 6  Difficult doing work/chores Not difficult at all    Somewhat difficult      12/27/2021    1:23 PM 11/10/2020    9:37 AM 10/27/2020    1:22 PM 10/15/2020   11:03 AM  GAD 7 : Generalized Anxiety Score  Nervous, Anxious, on Edge 1 1 3  0  Control/stop worrying 1 1 1  0  Worry too much - different things 1 0 1 0  Trouble relaxing 1 1 0 0  Restless 1 0 0 0  Easily annoyed or irritable 1 0 0 0  Afraid - awful might happen 0 0 0 0  Total GAD 7 Score 6 3 5  0  Anxiety Difficulty Not difficult at all  Not difficult at all         Medications: Outpatient Medications Prior to Visit   Medication Sig   acetaminophen (TYLENOL) 325 MG tablet Take 650 mg by mouth 4 (four) times daily as needed (for pain.).   amLODipine (NORVASC) 2.5 MG tablet TAKE 1 TABLET BY MOUTH IN  THE EVENING   Baclofen 5 MG TABS Take 1 tablet by mouth 2 (two) times daily as needed.   carvedilol (COREG) 6.25 MG tablet TAKE 1 TABLET BY MOUTH  TWICE DAILY   cetirizine (ZYRTEC) 10 MG tablet Take 10 mg by mouth daily.   Coenzyme Q10 100 MG capsule Take 200 mg by mouth daily.   estradiol (ESTRACE) 0.1 MG/GM vaginal cream Place vaginally.   ferrous sulfate 325 (65 FE) MG tablet Take 1 tablet (325 mg total) by mouth daily with breakfast. Take on Monday, Wednesday, Fridays   gabapentin (NEURONTIN) 100 MG capsule gabapentin 100 mg capsule  Take 1 capsule twice a day by oral route.  May cause drowsiness   hydrochlorothiazide (HYDRODIURIL) 25 MG tablet TAKE 1 TABLET BY MOUTH  DAILY   HYDROcodone-acetaminophen (  NORCO/VICODIN) 5-325 MG tablet Take 1 tablet by mouth every 6 (six) hours as needed for moderate pain or severe pain.    methocarbamol (ROBAXIN) 500 MG tablet methocarbamol 500 mg tablet  Take 1 tablet twice a day by oral route as needed.   naproxen (NAPROSYN) 500 MG tablet TAKE 1 TABLET BY MOUTH TWICE  DAILY WITH MEALS   omeprazole (PRILOSEC) 20 MG capsule TAKE 1 CAPSULE BY MOUTH DAILY   ondansetron (ZOFRAN) 4 MG tablet Take 1 tablet (4 mg total) by mouth every 8 (eight) hours as needed for nausea or vomiting.   Polyethyl Glycol-Propyl Glycol 0.4-0.3 % SOLN Place 1 drop into both eyes 3 (three) times daily as needed (for dry/irritated eyes.).   rosuvastatin (CRESTOR) 20 MG tablet Take 1 tablet (20 mg total) by mouth daily.   [DISCONTINUED] FLUoxetine (PROZAC) 20 MG capsule Take 20 mg by mouth daily.   [DISCONTINUED] fluticasone (FLONASE) 50 MCG/ACT nasal spray Place 2 sprays into both nostrils daily.   [DISCONTINUED] magic mouthwash (nystatin, lidocaine, diphenhydrAMINE) suspension Take 5 mLs by mouth 3  (three) times daily as needed for mouth pain.   [DISCONTINUED] predniSONE (DELTASONE) 10 MG tablet Take 10 mg by mouth.   [DISCONTINUED] sulfamethoxazole-trimethoprim (BACTRIM DS) 800-160 MG tablet Take 1 tablet by mouth 2 (two) times daily.   [DISCONTINUED] doxycycline (VIBRA-TABS) 100 MG tablet Take 1 tablet (100 mg total) by mouth 2 (two) times daily. (Patient not taking: Reported on 07/06/2021)   [DISCONTINUED] nitrofurantoin, macrocrystal-monohydrate, (MACROBID) 100 MG capsule Take 1 capsule (100 mg total) by mouth 2 (two) times daily. (Patient not taking: Reported on 07/06/2021)   No facility-administered medications prior to visit.    Review of Systems Review of Systems:  A fourteen system review of systems was performed and found to be positive as per HPI.  Last CBC Lab Results  Component Value Date   WBC 6.6 06/04/2020   HGB 12.0 06/04/2020   HCT 36.3 06/04/2020   MCV 95 06/04/2020   MCH 31.4 06/04/2020   RDW 13.0 06/04/2020   PLT 333 59/16/3846   Last metabolic panel Lab Results  Component Value Date   GLUCOSE 97 07/06/2021   NA 142 07/06/2021   K 5.5 (H) 07/06/2021   CL 102 07/06/2021   CO2 24 07/06/2021   BUN 24 07/06/2021   CREATININE 1.20 (H) 07/06/2021   EGFR 46 (L) 07/06/2021   CALCIUM 9.6 07/06/2021   PHOS 3.4 06/25/2009   PROT 6.9 03/01/2020   ALBUMIN 4.2 03/01/2020   LABGLOB 2.7 03/01/2020   AGRATIO 1.6 03/01/2020   BILITOT 0.6 03/01/2020   ALKPHOS 92 03/01/2020   AST 20 03/01/2020   ALT 11 07/06/2021   ANIONGAP 12 07/27/2017   Last lipids Lab Results  Component Value Date   CHOL 147 07/06/2021   HDL 45 07/06/2021   LDLCALC 87 07/06/2021   TRIG 77 07/06/2021   CHOLHDL 3.3 07/06/2021   Last hemoglobin A1c Lab Results  Component Value Date   HGBA1C 5.4 03/01/2020   Last thyroid functions Lab Results  Component Value Date   TSH 0.986 03/01/2020       Objective    BP (!) 140/80   Pulse 62   Temp 97.9 F (36.6 C) (Oral)   Ht 5' 3"   (1.6 m)   Wt 214 lb (97.1 kg)   BMI 37.91 kg/m  BP Readings from Last 3 Encounters:  12/27/21 (!) 140/80  07/06/21 132/68  03/23/21 (!) 183/105   Wt Readings from Last  3 Encounters:  12/27/21 214 lb (97.1 kg)  07/06/21 217 lb 3.2 oz (98.5 kg)  03/23/21 216 lb 3.2 oz (98.1 kg)    Physical Exam  General:  Cooperative, in no acute distress, appropriate for stated age.  Neuro:  Alert and oriented,  extra-ocular muscles intact  HEENT:  Normocephalic, atraumatic, neck supple  Skin:  no gross rash, warm, pink. Cardiac:  RRR, S1 S2 Respiratory: CTA B/L  Vascular:  Ext warm, no cyanosis apprec.; no edema Psych:  No HI/SI, judgement and insight good, Euthymic mood. Full Affect.   No results found for any visits on 12/27/21.  Assessment & Plan      Problem List Items Addressed This Visit       Cardiovascular and Mediastinum   Essential hypertension - Primary    -BP elevated on intake with some improvement after repeated, goal <140/90. Recommend to continue to follow-up with cardiology as advised and current medication regimen. Recommend to follow a low salt diet. Recommend repeating BP with medicare wellness lab visit.         Respiratory   Allergic rhinitis    -Stable. Continue oral antihistamine and Flonase.       Relevant Medications   fluticasone (FLONASE) 50 MCG/ACT nasal spray     Other   Hyperlipidemia    -Followed by cardiology. Last lipid panel normal, LDL 87. Continue current medication regimen. Recommend repeating lipid panel and hepatic function with medicare wellness lab visit.      Depression    -PHQ-9 score of 6. No SI/HI. Discussed with patient sertraline 25 mg was sent to her mail pharmacy so unsure why it was not delivered. Pt has been on Prozac and wants to continue, so will continue with Prozac 20 mg daily.       Relevant Medications   FLUoxetine (PROZAC) 20 MG capsule   FLUoxetine (PROZAC) 20 MG capsule   Other Visit Diagnoses     Rash and  nonspecific skin eruption       Relevant Medications   triamcinolone cream (KENALOG) 0.1 %      Rash and nonspecific skin eruption: -Discussed with patient to use topical corticosteroid cream as needed for rash.   Return in about 3 months (around 03/28/2022) for [facility] (ok for telehealth) and FBW few days prior.        Lorrene Reid, PA-C  [facility] at [facility] [phone] (phone) [phone] (fax)  [place]

## 2021-12-27 NOTE — Assessment & Plan Note (Signed)
-  Stable. Continue oral antihistamine and Flonase.

## 2021-12-27 NOTE — Assessment & Plan Note (Signed)
-  PHQ-9 score of 6. No SI/HI. Discussed with patient sertraline 25 mg was sent to her mail pharmacy so unsure why it was not delivered. Pt has been on Prozac and wants to continue, so will continue with Prozac 20 mg daily.

## 2021-12-27 NOTE — Assessment & Plan Note (Addendum)
-  BP elevated on intake with some improvement after repeated, goal <140/90. Recommend to continue to follow-up with cardiology as advised and current medication regimen. Recommend to follow a low salt diet. Recommend repeating BP with medicare wellness lab visit.

## 2022-03-04 IMAGING — CR DG SHOULDER 2+V*R*
2 series · 2 of 2 positions shown · non-contrast
Comparison: None.

CLINICAL DATA: Fall

EXAM:
RIGHT SHOULDER - 2+ VIEW

[w shoulder external right]
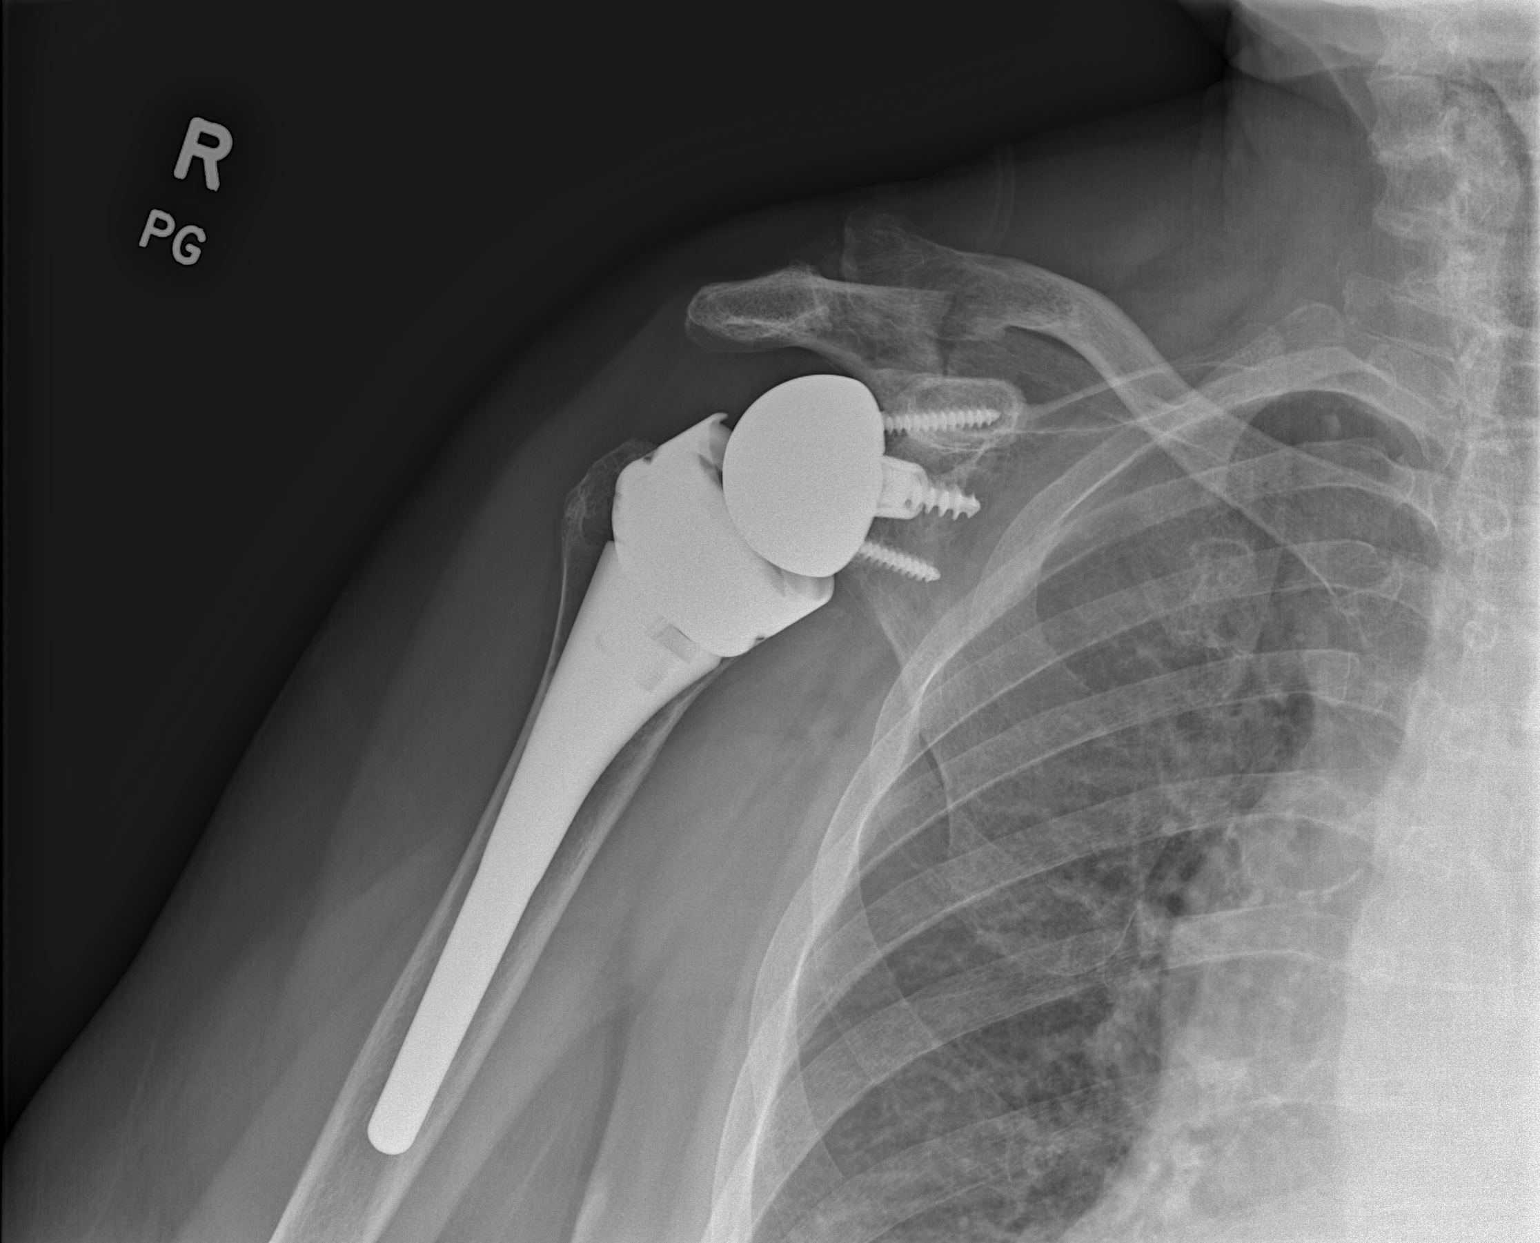

[w shoulder y-view right]
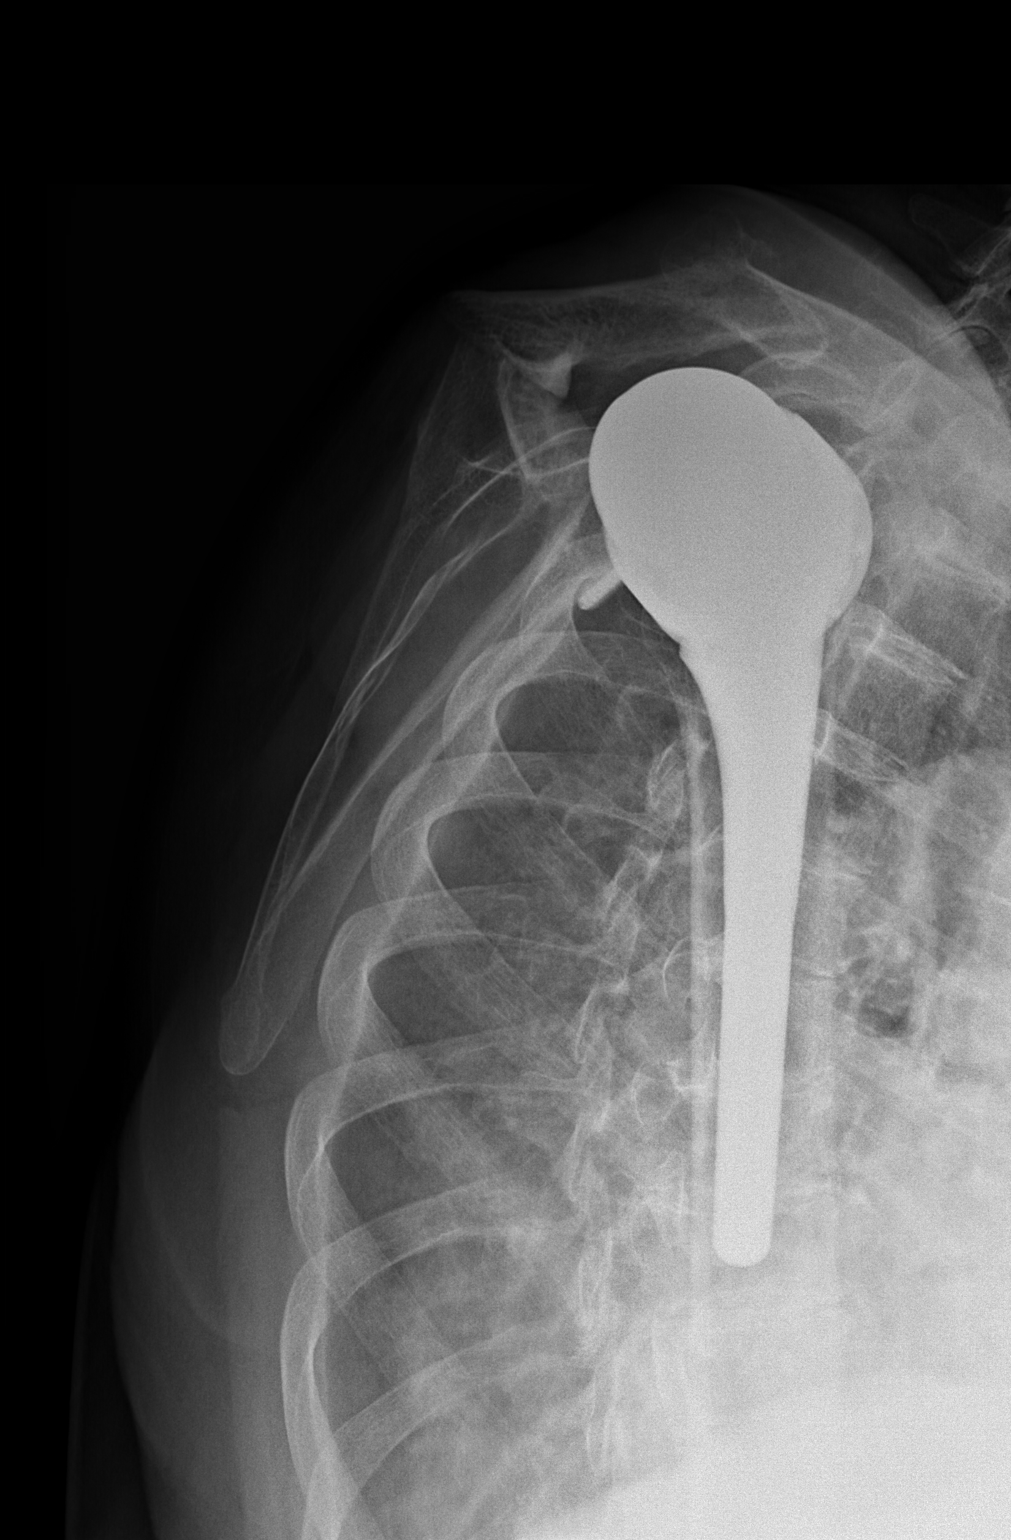

[2 of 2 positions shown; findings below may reference images not displayed]

FINDINGS: Right shoulder arthroplasty, without evidence of complication.

Lateral scapular fracture along the base of the coracoid process.

Clavicle is intact.

Visualized right lung is clear.
IMPRESSION: Lateral scapular fracture.

## 2022-03-04 IMAGING — CR DG SCAPULA*R*
2 series · 2 of 2 positions shown · non-contrast
Comparison: None.

CLINICAL DATA: Fall

EXAM:
RIGHT SCAPULA - 2+ VIEWS

[w scapula ap right]
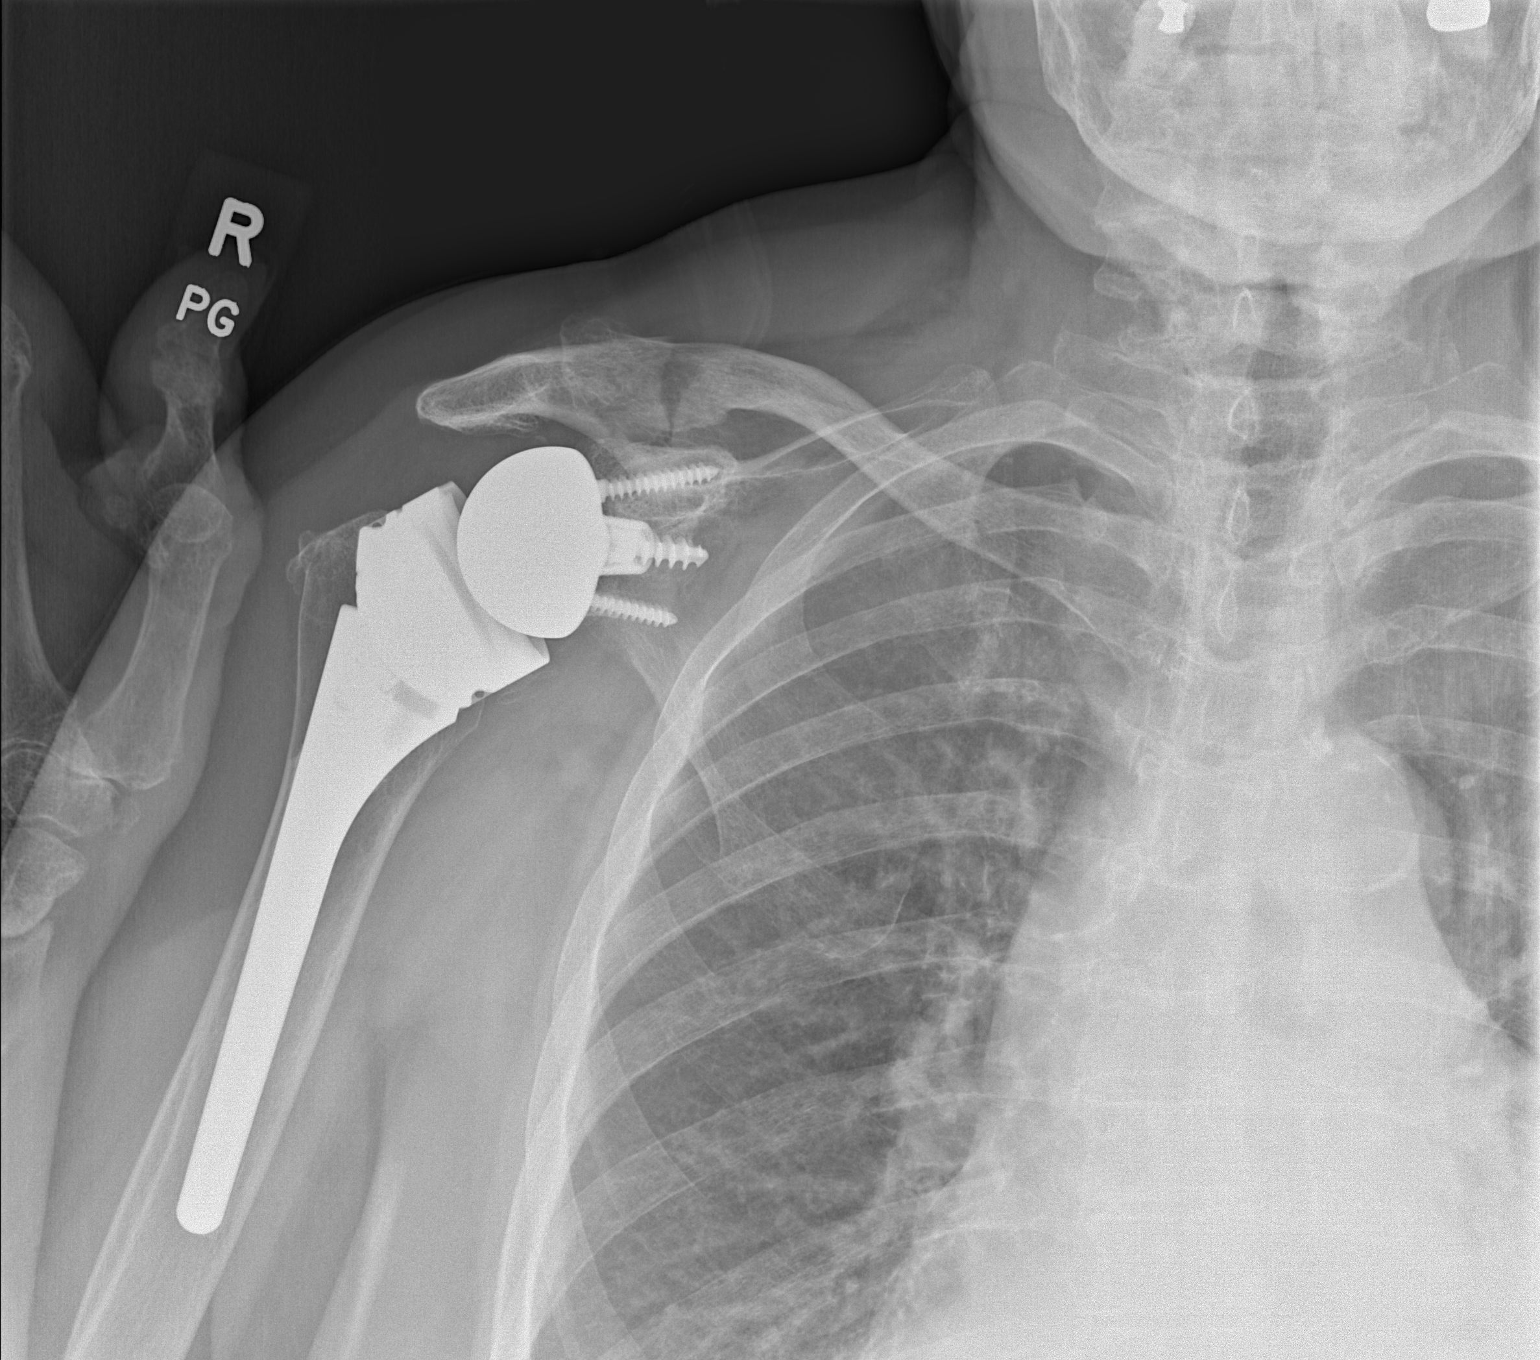

[w scapula y-view right]
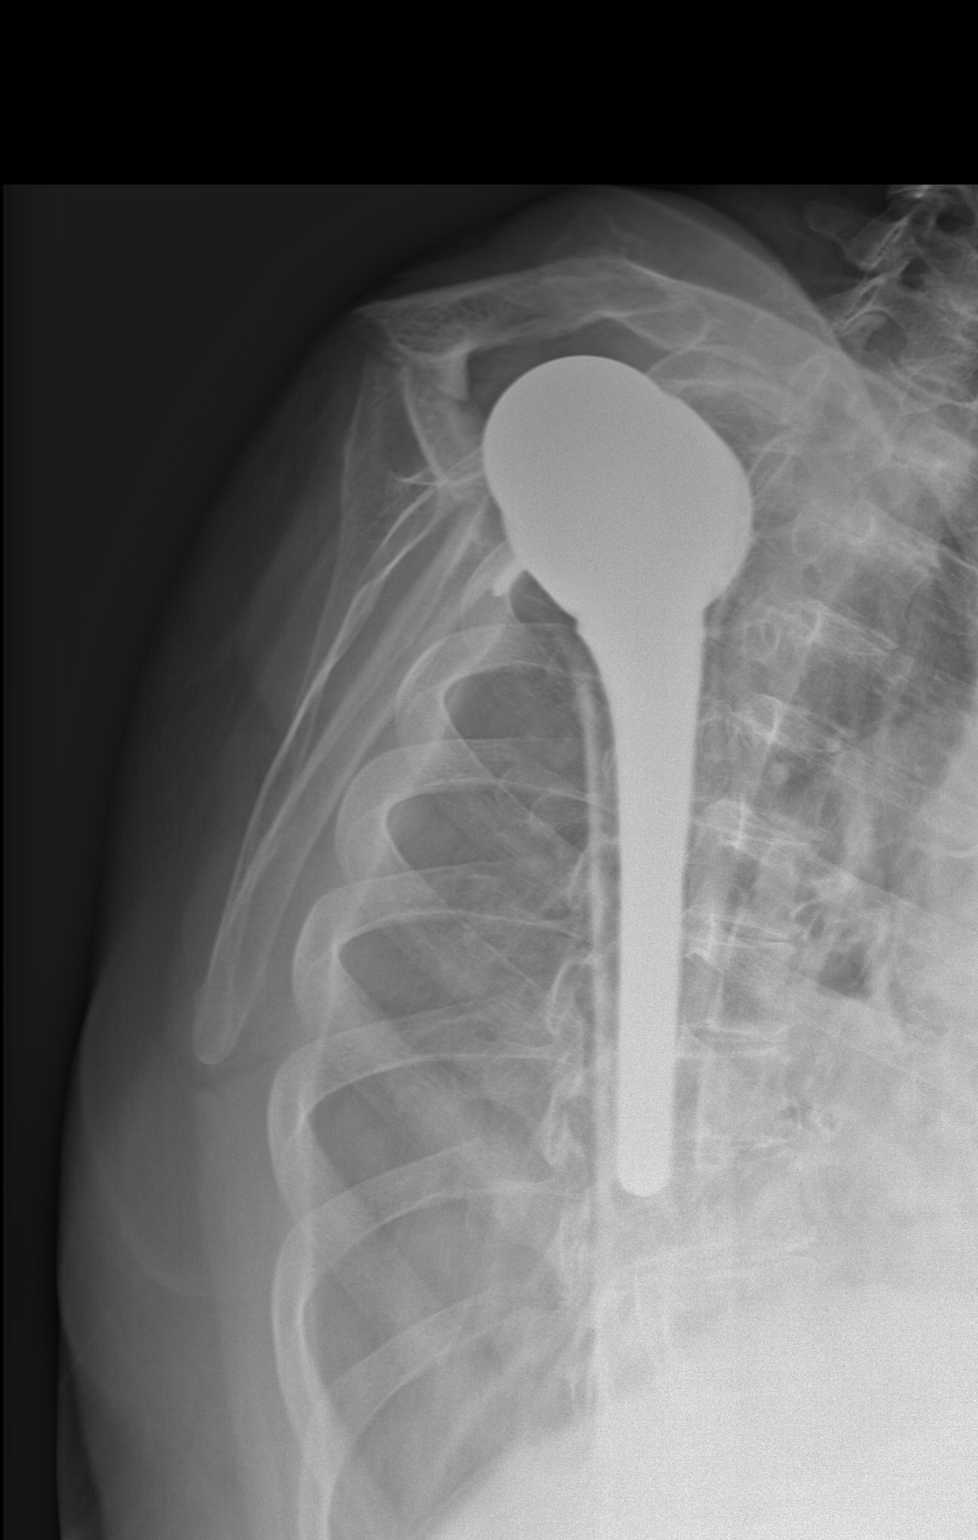

[2 of 2 positions shown; findings below may reference images not displayed]

FINDINGS: Lateral scapular fracture along the base of the coracoid process.

Right shoulder arthroplasty.

Clavicles intact.

Visualized right lung is clear.
IMPRESSION: Lateral scapular fracture.

## 2022-03-04 IMAGING — CR DG KNEE COMPLETE 4+V*R*
4 series · 4 of 4 positions shown · non-contrast
Comparison: None.

CLINICAL DATA: Fall

EXAM:
RIGHT KNEE - COMPLETE 4+ VIEW

[t knee ap right]
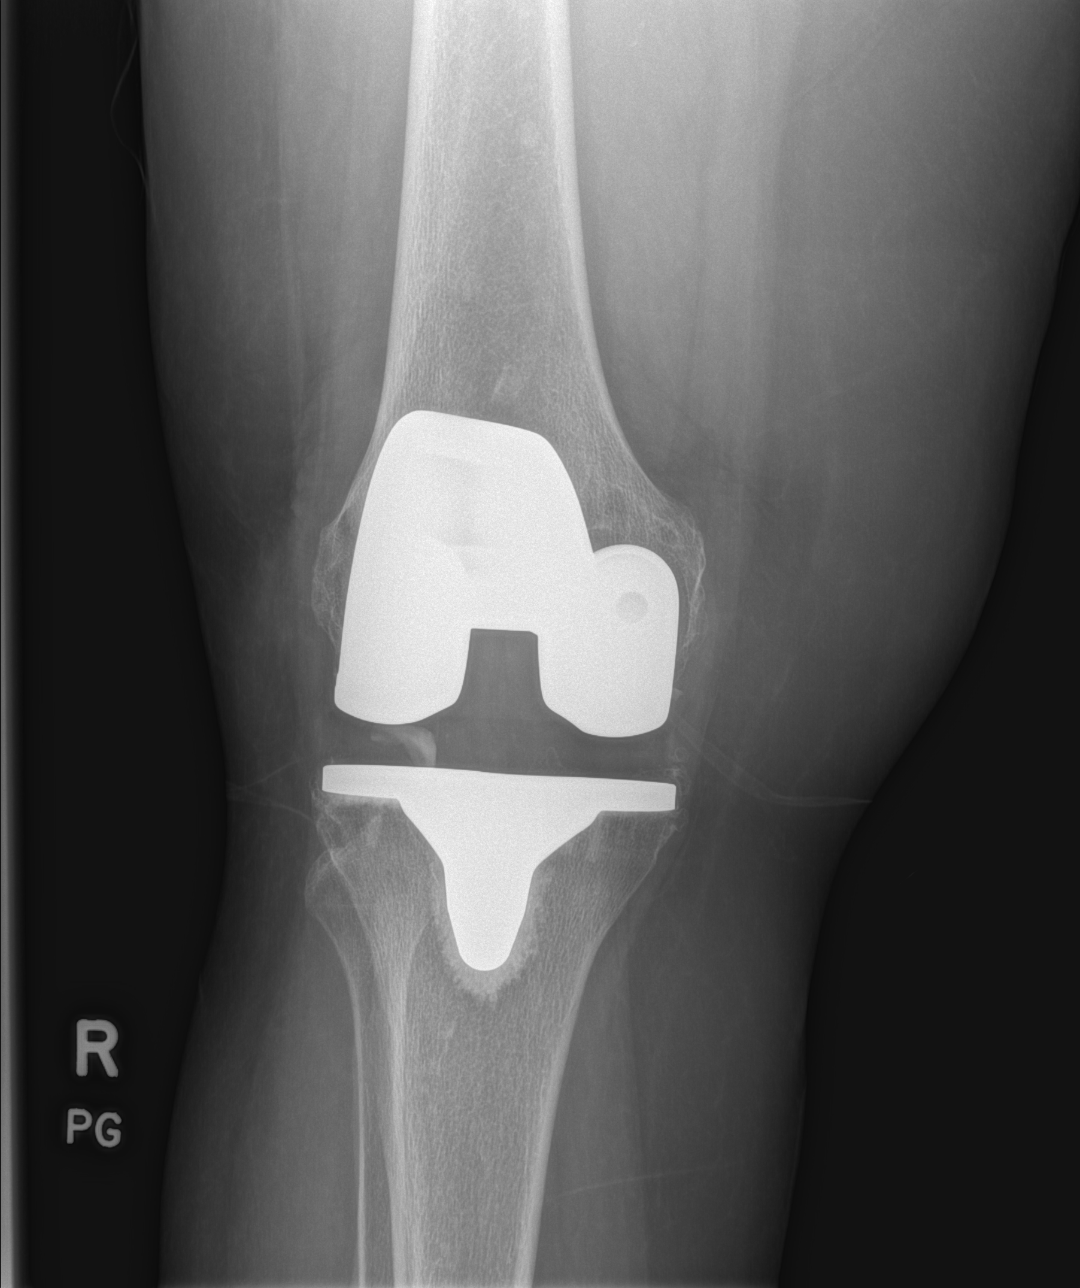

[t knee obl right (1 of 2)]
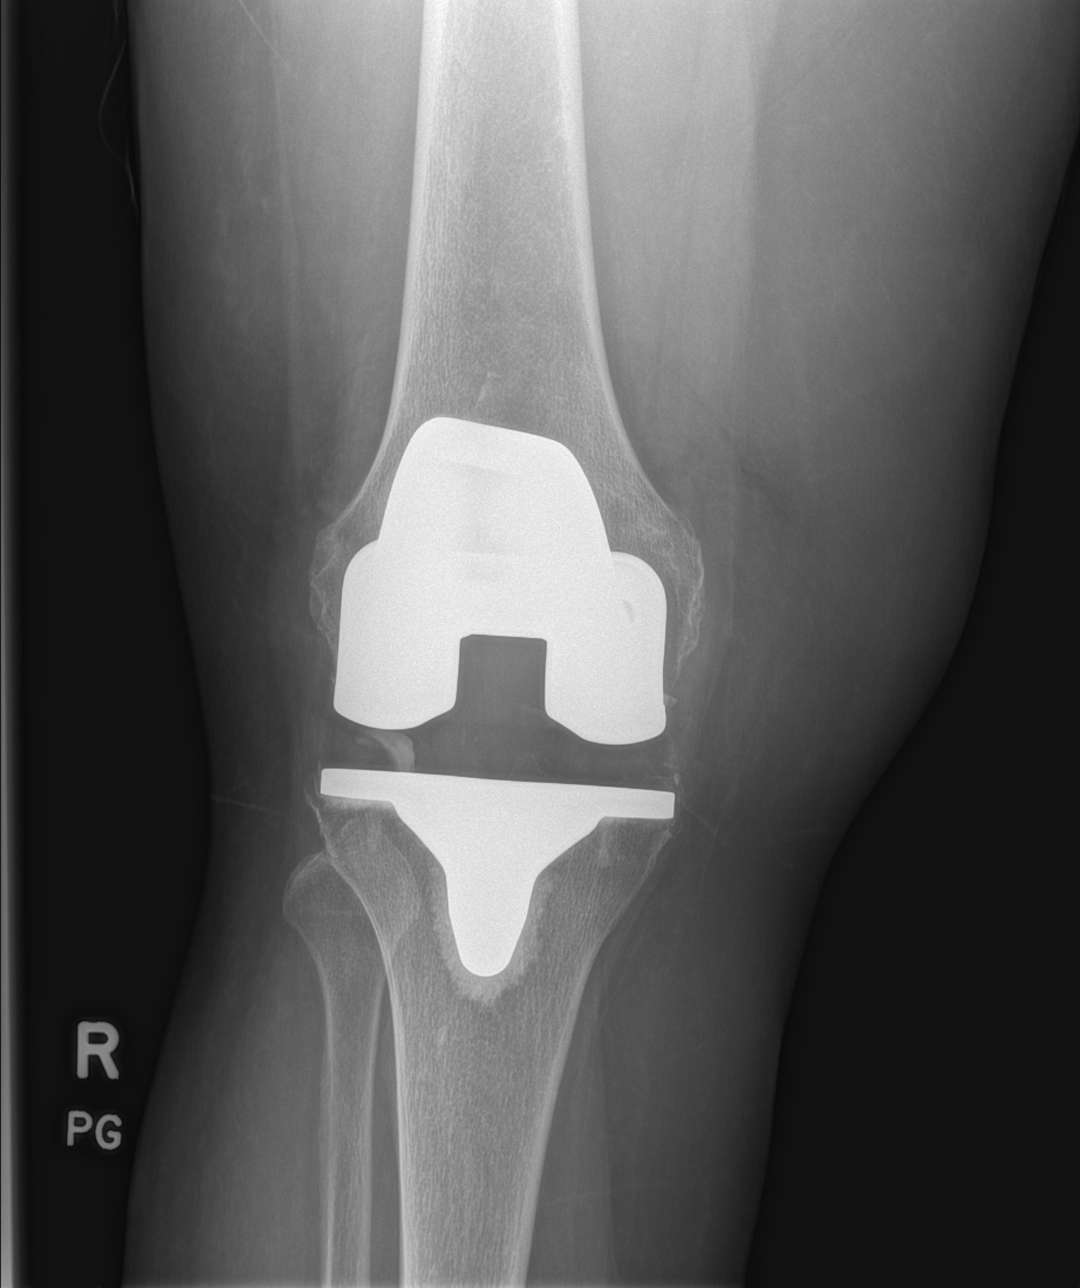

[t knee obl right (2 of 2)]
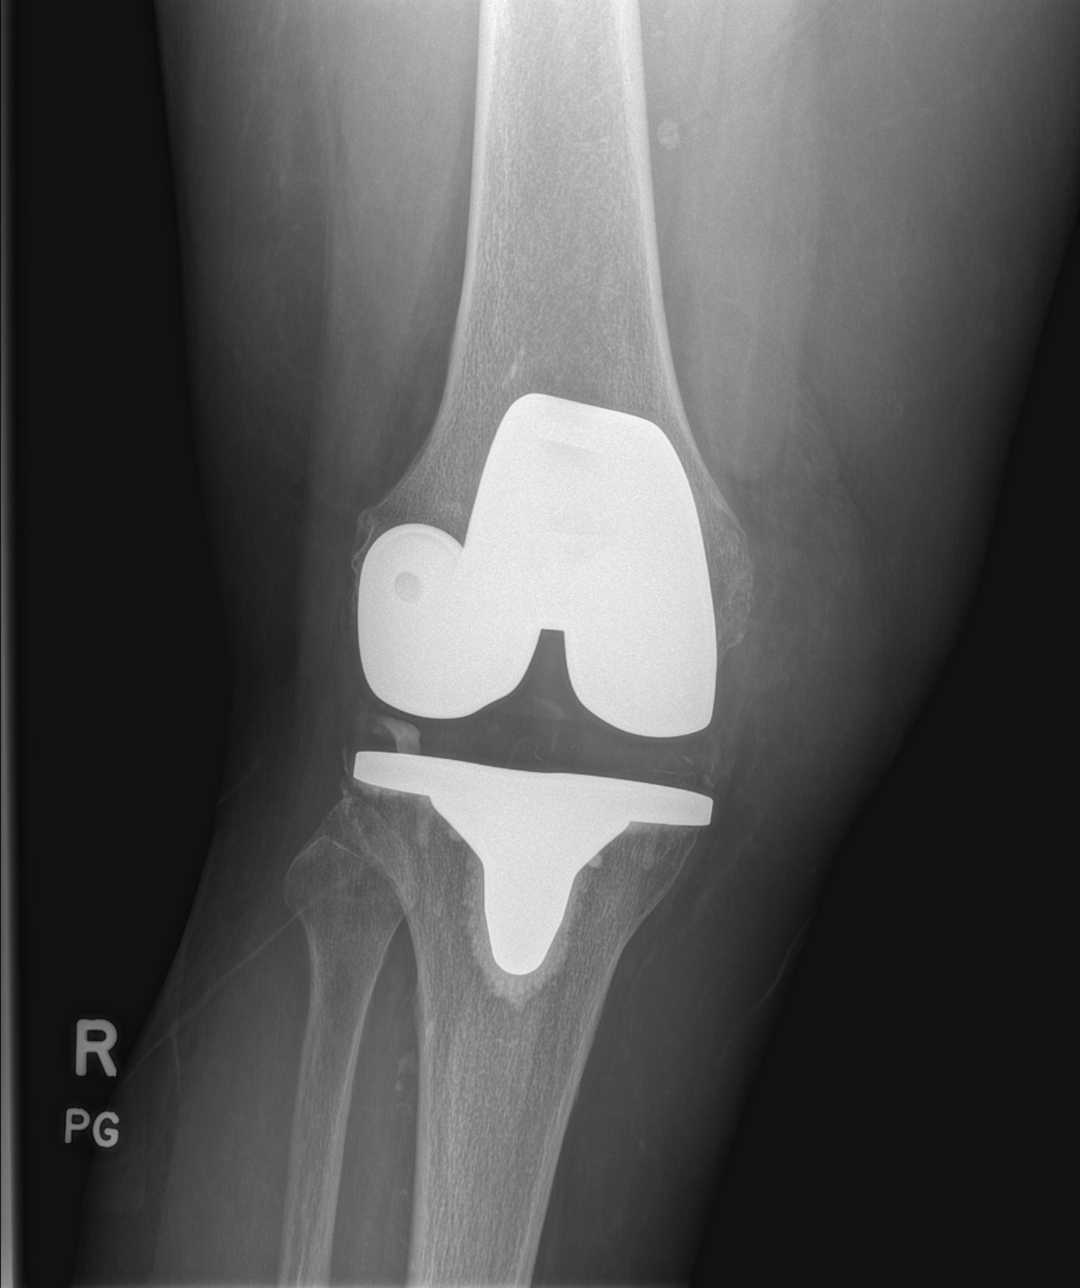

[x knee lat right]
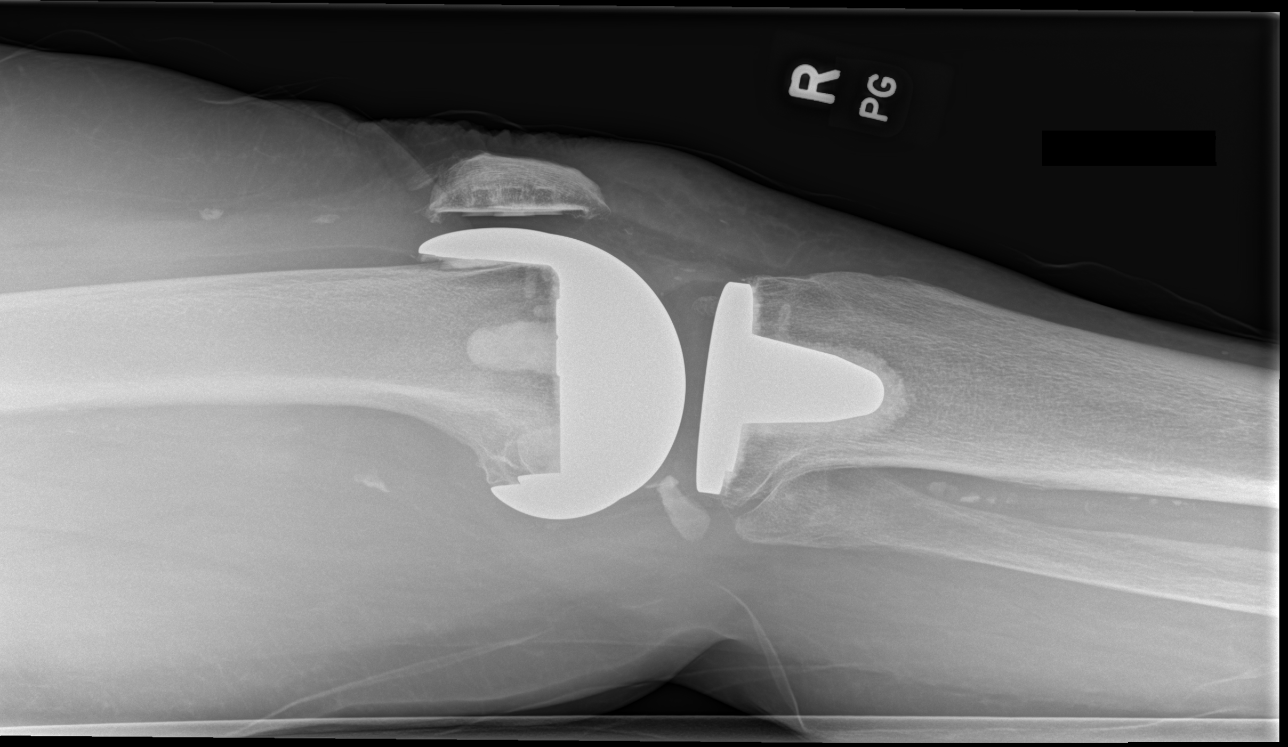

[4 of 4 positions shown; findings below may reference images not displayed]

FINDINGS: Right knee arthroplasty, without evidence of complication.

No fracture or dislocation is seen.

Visualized soft tissues are within normal limits.

No suprapatellar knee joint effusion.
IMPRESSION: Right knee arthroplasty, without evidence of complication.

## 2022-03-08 ENCOUNTER — Other Ambulatory Visit: Payer: Self-pay

## 2022-03-08 DIAGNOSIS — R21 Rash and other nonspecific skin eruption: Secondary | ICD-10-CM

## 2022-03-08 MED ORDER — TRIAMCINOLONE ACETONIDE 0.1 % EX CREA: 1.0000 | TOPICAL_CREAM | Freq: Two times a day (BID) | CUTANEOUS | 0 refills | Status: DC

## 2022-03-22 ENCOUNTER — Other Ambulatory Visit: Payer: Self-pay

## 2022-03-22 ENCOUNTER — Other Ambulatory Visit: Payer: Medicare Other

## 2022-03-22 DIAGNOSIS — Z0000 Encounter for general adult medical examination without abnormal findings: Secondary | ICD-10-CM

## 2022-03-23 LAB — COMPREHENSIVE METABOLIC PANEL
ALT: 14 IU/L (ref 0–32)
AST: 15 IU/L (ref 0–40)
Albumin/Globulin Ratio: 1.6 (ref 1.2–2.2)
Albumin: 4.4 g/dL (ref 3.8–4.8)
Alkaline Phosphatase: 88 IU/L (ref 44–121)
BUN/Creatinine Ratio: 21 (ref 12–28)
BUN: 21 mg/dL (ref 8–27)
Bilirubin Total: 0.6 mg/dL (ref 0.0–1.2)
CO2: 24 mmol/L (ref 20–29)
Calcium: 9.3 mg/dL (ref 8.7–10.3)
Chloride: 100 mmol/L (ref 96–106)
Creatinine, Ser: 1.01 mg/dL — ABNORMAL HIGH (ref 0.57–1.00)
Globulin, Total: 2.7 g/dL (ref 1.5–4.5)
Glucose: 93 mg/dL (ref 70–99)
Potassium: 4.9 mmol/L (ref 3.5–5.2)
Sodium: 138 mmol/L (ref 134–144)
Total Protein: 7.1 g/dL (ref 6.0–8.5)
eGFR: 56 mL/min/{1.73_m2} — ABNORMAL LOW (ref >59)

## 2022-03-23 LAB — TSH+FREE T4
Free T4: 1.02 ng/dL (ref 0.82–1.77)
TSH: 0.9 u[IU]/mL (ref 0.450–4.500)

## 2022-03-23 LAB — LIPID PANEL
Chol/HDL Ratio: 3 ratio (ref 0.0–4.4)
Cholesterol, Total: 164 mg/dL (ref 100–199)
HDL: 54 mg/dL (ref >39)
LDL Chol Calc (NIH): 98 mg/dL (ref 0–99)
Triglycerides: 59 mg/dL (ref 0–149)
VLDL Cholesterol Cal: 12 mg/dL (ref 5–40)

## 2022-03-23 LAB — CBC
Hematocrit: 41.2 % (ref 34.0–46.6)
Hemoglobin: 13.4 g/dL (ref 11.1–15.9)
MCH: 30.9 pg (ref 26.6–33.0)
MCHC: 32.5 g/dL (ref 31.5–35.7)
MCV: 95 fL (ref 79–97)
Platelets: 299 10*3/uL (ref 150–450)
RBC: 4.34 x10E6/uL (ref 3.77–5.28)
RDW: 13.2 % (ref 11.7–15.4)
WBC: 5.2 10*3/uL (ref 3.4–10.8)

## 2022-03-23 LAB — HEMOGLOBIN A1C
Est. average glucose Bld gHb Est-mCnc: 111 mg/dL
Hgb A1c MFr Bld: 5.5 % (ref 4.8–5.6)

## 2022-04-21 ENCOUNTER — Telehealth: Payer: Self-pay | Admitting: Cardiovascular Disease

## 2022-04-21 NOTE — Telephone Encounter (Signed)
Patient is requesting orders to have lab work prior to 3/25 follow up with Ambrose Pancoast, NP.

## 2022-04-26 NOTE — Telephone Encounter (Signed)
Returned call to patient. She had labs checked by PCP last month, so informed her that we wouldn't need to do repeat labs prior to march appt, unless she just wanted them done. She states she doesn't and has no further questions.

## 2022-04-27 ENCOUNTER — Encounter: Payer: Self-pay | Admitting: Nurse Practitioner

## 2022-04-27 ENCOUNTER — Ambulatory Visit (INDEPENDENT_AMBULATORY_CARE_PROVIDER_SITE_OTHER): Payer: Medicare Other | Admitting: Nurse Practitioner

## 2022-04-27 VITALS — BP 135/74 | Ht 63.0 in | Wt 206.0 lb

## 2022-04-27 DIAGNOSIS — E782 Mixed hyperlipidemia: Secondary | ICD-10-CM

## 2022-04-27 DIAGNOSIS — I10 Essential (primary) hypertension: Secondary | ICD-10-CM

## 2022-04-27 DIAGNOSIS — Z0000 Encounter for general adult medical examination without abnormal findings: Secondary | ICD-10-CM | POA: Diagnosis not present

## 2022-04-27 DIAGNOSIS — F32A Depression, unspecified: Secondary | ICD-10-CM

## 2022-04-27 NOTE — Progress Notes (Signed)
I connected with Tracey Walton on 05/27/22 by audio only telemedicine application and verified that I am speaking with the correct person using two identifiers.   I discussed the limitations, risks, security and privacy concerns of performing an evaluation and management service by telephone and the availability of in person appointments. I also discussed with the patient responsible charge related to this service. The patient expressed understanding and verbally consented to this telephonic visit.   Locations of Patient: home  Location of Provider: [facility]    Subjective:   Tracey Walton is a 82 y.o. female who presents for Medicare Annual (Subsequent) preventive examination. -routine, fasting labs done 03/23/2022 -  --improved renal functions --other labs normal  -needs bone density test if not already done  -well managed HTN -takes prozac for mood  -She denies chest pain, chest pressure, or shortness of breath. She denies headaches or visual disturbances. She denies abdominal pain, nausea, vomiting, or changes in bowel or bladder habits.    Review of Systems    See HPI         Objective:    Today's Vitals   04/27/22 1414  BP: 135/74  Weight: 206 lb (93.4 kg)  Height: '5\' 3"'$  (1.6 m)   Body mass index is 36.49 kg/m.    Row Labels 01/20/2021    2:33 AM 12/27/2020   11:57 AM 11/07/2017   11:11 AM 08/09/2017    4:08 PM 08/09/2017    3:00 PM 07/27/2017    3:06 PM 10/12/2016    2:25 PM  Advanced Directives   Section Header. No data exists in this row.         Does Patient Have a Medical Advance Directive?   No No No  Yes Yes Yes  Type of Teacher, early years/pre;Living will [place];Living will [place];Living will Living will  Does patient want to make changes to medical advance directive?       No - Patient declined  No - Patient declined  Copy of [place] in  Chart?      No - copy requested  No - copy requested   Would patient like information on creating a medical advance directive?    No - Patient declined No - Patient declined        Current Medications (verified) Outpatient Encounter Medications as of 04/27/2022  Medication Sig   acetaminophen (TYLENOL) 325 MG tablet Take 650 mg by mouth 4 (four) times daily as needed (for pain.).   Baclofen 5 MG TABS Take 1 tablet by mouth 2 (two) times daily as needed.   cetirizine (ZYRTEC) 10 MG tablet Take 10 mg by mouth daily.   Coenzyme Q10 100 MG capsule Take 200 mg by mouth daily.   estradiol (ESTRACE) 0.1 MG/GM vaginal cream Place vaginally.   ferrous sulfate 325 (65 FE) MG tablet Take 1 tablet (325 mg total) by mouth daily with breakfast. Take on Monday, Wednesday, Fridays   FLUoxetine (PROZAC) 20 MG capsule Take 1 capsule (20 mg total) by mouth daily.   fluticasone (FLONASE) 50 MCG/ACT nasal spray Place 2 sprays into both nostrils daily.   gabapentin (NEURONTIN) 100 MG capsule gabapentin 100 mg capsule  Take 1 capsule twice a day by oral route.  May cause drowsiness   HYDROcodone-acetaminophen (NORCO/VICODIN) 5-325 MG tablet Take 1 tablet by mouth every 6 (six) hours as needed  for moderate pain or severe pain.    methocarbamol (ROBAXIN) 500 MG tablet methocarbamol 500 mg tablet  Take 1 tablet twice a day by oral route as needed.   naproxen (NAPROSYN) 500 MG tablet TAKE 1 TABLET BY MOUTH TWICE  DAILY WITH MEALS   omeprazole (PRILOSEC) 20 MG capsule TAKE 1 CAPSULE BY MOUTH DAILY   ondansetron (ZOFRAN) 4 MG tablet Take 1 tablet (4 mg total) by mouth every 8 (eight) hours as needed for nausea or vomiting.   Polyethyl Glycol-Propyl Glycol 0.4-0.3 % SOLN Place 1 drop into both eyes 3 (three) times daily as needed (for dry/irritated eyes.).   triamcinolone cream (KENALOG) 0.1 % Apply 1 Application topically 2 (two) times daily.   trimethoprim (TRIMPEX) 100 MG tablet Take 100 mg by mouth daily. At bed time    [DISCONTINUED] amLODipine (NORVASC) 2.5 MG tablet TAKE 1 TABLET BY MOUTH IN  THE EVENING   [DISCONTINUED] carvedilol (COREG) 6.25 MG tablet TAKE 1 TABLET BY MOUTH  TWICE DAILY   [DISCONTINUED] FLUoxetine (PROZAC) 20 MG capsule Take 1 capsule (20 mg total) by mouth daily.   [DISCONTINUED] hydrochlorothiazide (HYDRODIURIL) 25 MG tablet TAKE 1 TABLET BY MOUTH  DAILY   [DISCONTINUED] rosuvastatin (CRESTOR) 20 MG tablet Take 1 tablet (20 mg total) by mouth daily.   No facility-administered encounter medications on file as of 04/27/2022.    Allergies (verified) Penicillins   History: Past Medical History:  Diagnosis Date   Anxiety    Arthritis    "qwhere" (08/09/2017)   Chest pain 2011   negative for MI   Chronic kidney disease    renal insufficiency. 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 down after her knee surgery back in 2008   Chronic lower back pain    Depression    Dyspnea    SOB while walking   GERD (gastroesophageal reflux disease)    Heart murmur    History of hiatal hernia    Hyperlipidemia    Hypertension    Osteoporosis    Pneumonia ~ 2014   Polymyalgia ([place])    Strep throat    Past Surgical History:  Procedure Laterality Date   ABDOMINAL HYSTERECTOMY     partial   BILATERAL CARPAL TUNNEL RELEASE  2003-2005   right-left   CATARACT EXTRACTION W/ INTRAOCULAR LENS  IMPLANT, BILATERAL Bilateral    JOINT REPLACEMENT     REVERSE SHOULDER ARTHROPLASTY Left 10/12/2016   Procedure: REVERSE LEFT SHOULDER ARTHROPLASTY;  Surgeon: Justice Britain, MD;  Location: [place];  Service: Orthopedics;  Laterality: Left;   REVERSE SHOULDER ARTHROPLASTY Right 08/09/2017   Procedure: RIGHT REVERSE SHOULDER ARTHROPLASTY;  Surgeon: Justice Britain, MD;  Location: [place];  Service: Orthopedics;  Laterality: Right;   SHOULDER ARTHROSCOPY Right 2003   TOOTH EXTRACTION     "1 tooth" (08/09/2017)   TOTAL KNEE ARTHROPLASTY Bilateral    Family History  Problem Relation Age of Onset   Heart attack Mother    Heart  disease Mother    Hypertension Father    Heart failure Father    Kidney failure Father    Hyperlipidemia Father    Stroke Father    Hypertension Sister    Hyperlipidemia Sister    Social History   Socioeconomic History   Marital status: Widowed    Spouse name: Not on file   Number of children: 3   Years of education: Not on file   Highest education level: 10th grade  Occupational History   Not on file  Tobacco  Use   Smoking status: Never   Smokeless tobacco: Never  Vaping Use   Vaping Use: Never used  Substance and Sexual Activity   Alcohol use: Never   Drug use: Never   Sexual activity: Not Currently    Birth control/protection: None  Other Topics Concern   Not on file  Social History Narrative   Lives with daughter   caffeine not much at all   Social Determinants of Health   Financial Resource Strain: Low Risk  (04/27/2022)   Overall Financial Resource Strain (CARDIA)    Difficulty of Paying Living Expenses: Not very hard  Food Insecurity: No Food Insecurity (04/27/2022)   Hunger Vital Sign    Worried About Running Out of Food in the Last Year: Never true    Ran Out of Food in the Last Year: Never true  Transportation Needs: No Transportation Needs (04/27/2022)   PRAPARE - Hydrologist (Medical): No    Lack of Transportation (Non-Medical): No  Physical Activity: Inactive (04/27/2022)   Exercise Vital Sign    Days of Exercise per Week: 0 days    Minutes of Exercise per Session: 0 min  Stress: No Stress Concern Present (04/27/2022)   [place]    Feeling of Stress : Only a little  Social Connections: Socially Isolated (04/27/2022)   Social Connection and Isolation Panel [NHANES]    Frequency of Communication with Friends and Family: More than three times a week    Frequency of Social Gatherings with Friends and Family: More than three times a week    Attends Religious  Services: Never    Marine scientist or Organizations: No    Attends Archivist Meetings: Never    Marital Status: Widowed    Tobacco Counseling Counseling given: Not Answered   Clinical Intake:  Pre-visit preparation completed: Yes  Pain : No/denies pain     BMI - recorded: 36.49 Nutritional Status: BMI > 30  Obese Diabetes: No  How often do you need to have someone help you when you read instructions, pamphlets, or other written materials from your doctor or pharmacy?: 1 - Never  Diabetic?no  Interpreter Needed?: No      Activities of Daily Living   Row Labels 04/27/2022    2:28 PM 12/27/2021    1:23 PM  In your present state of health, do you have any difficulty performing the following activities:   Section Header. No data exists in this row.    Hearing?   1 1  Vision?   0 0  Difficulty concentrating or making decisions?   0 0  Walking or climbing stairs?   1 1  Dressing or bathing?   0 0  Doing errands, shopping?   0 0    Patient Care Team: Lorrene Reid, PA-C (Inactive) as PCP - General (Physician Assistant) Nahser, Wonda Cheng, MD as PCP - Cardiology (Cardiology)  Indicate any recent Medical Services you may have received from other than [facility] providers in the past year (date may be approximate).     Assessment:  1. Encounter for Medicare annual wellness exam Annual medicare wellness visit today   2. Essential hypertension Stable blood pressure. No changes in medications.   3. Mixed hyperlipidemia Stable. Continue rosuvastatin as prescribed.   4. Depression, unspecified depression type Continue fluoxetine as prescribed.    Hearing/Vision screen No results found.   Depression Screen   Row Labels  04/27/2022    2:26 PM 12/27/2021    1:22 PM 12/27/2020   11:59 AM 12/27/2020   11:54 AM 11/10/2020    9:36 AM 10/27/2020    1:22 PM 10/15/2020   11:01 AM  PHQ 2/9 Scores   Section Header. No data exists in this row.         PHQ - 2  Score   0 1 0 0 0 0 1  PHQ- 9 Score   0 6 0 0 0 6 10    Fall Risk   Row Labels 04/27/2022    2:29 PM 12/27/2021    1:21 PM 12/27/2020   11:58 AM 11/10/2020    9:37 AM 10/27/2020    1:21 PM  Fall Risk    Section Header. No data exists in this row.       Falls in the past year?   0 1 1 0 1  Number falls in past yr:   '1 1 1 1 1  '$ Injury with Fall?   0 1 0 0 0  Risk for fall due to :    History of fall(s);Impaired balance/gait   History of fall(s);No Fall Risks;Impaired balance/gait;Impaired mobility  Follow up   Falls evaluation completed Falls evaluation completed Falls evaluation completed Falls evaluation completed Falls evaluation completed    FALL RISK PREVENTION PERTAINING TO THE HOME:  Any stairs in or around the home? No  If so, are there any without handrails? No  Home free of loose throw rugs in walkways, pet beds, electrical cords, etc? Yes  Adequate lighting in your home to reduce risk of falls? Yes   ASSISTIVE DEVICES UTILIZED TO PREVENT FALLS:  Life alert? Yes  Use of a cane, walker or w/c? Yes  Grab bars in the bathroom? Yes  Shower chair or bench in shower? No  Elevated toilet seat or a handicapped toilet? Yes   TIMED UP AND GO:  Was the test performed? No .  Length of time to ambulate 10 feet: 0 sec.   pt not in office  Cognitive Function:       Row Labels 04/27/2022    2:24 PM 12/27/2020   12:02 PM 12/03/2019    1:56 PM  6CIT Screen   Section Header. No data exists in this row.     What Year?   0 points 0 points 0 points  What month?   0 points 0 points 0 points  What time?   0 points 0 points 0 points  Count back from 20   0 points 2 points 2 points  Months in reverse   0 points 0 points 0 points  Repeat phrase   0 points 0 points 6 points  Total Score   0 points 2 points 8 points    Immunizations Immunization History  Administered Date(s) Administered   Influenza, High Dose Seasonal PF 02/21/2018, 12/21/2018, 01/22/2022   Influenza,inj,quad, With  Preservative 02/11/2017   Influenza-Unspecified 01/25/2020   Moderna Sars-Covid-2 Vaccination 07/22/2019, 08/27/2019, 04/06/2020   Pneumococcal Conjugate-13 03/19/2016   Pneumococcal Polysaccharide-23 11/07/2017, 11/14/2017   Zoster, Live 12/16/2011    TDAP status: Due, Education has been provided regarding the importance of this vaccine. Advised may receive this vaccine at local pharmacy or Health Dept. Aware to provide a copy of the vaccination record if obtained from local pharmacy or Health Dept. Verbalized acceptance and understanding.  Flu Vaccine status: Due, Education has been provided regarding the importance of this vaccine. Advised may receive this  vaccine at local pharmacy or Health Dept. Aware to provide a copy of the vaccination record if obtained from local pharmacy or Health Dept. Verbalized acceptance and understanding.  Pneumococcal vaccine status: Up to date  Covid-19 vaccine status: Completed vaccines  Qualifies for Shingles Vaccine? Yes   Zostavax completed Yes   Shingrix Completed?: No.    Education has been provided regarding the importance of this vaccine. Patient has been advised to call insurance company to determine out of pocket expense if they have not yet received this vaccine. Advised may also receive vaccine at local pharmacy or Health Dept. Verbalized acceptance and understanding.  Screening Tests Health Maintenance  Topic Date Due   DTaP/Tdap/Td (1 - Tdap) Never done   Zoster Vaccines- Shingrix (1 of 2) Never done   DEXA SCAN  Never done   COVID-19 Vaccine (4 - 2023-24 season) 12/02/2021   Medicare Annual Wellness (AWV)  04/28/2023   Pneumonia Vaccine 54+ Years old  Completed   INFLUENZA VACCINE  Completed   HPV VACCINES  Aged Out    Health Maintenance  Health Maintenance Due  Topic Date Due   DTaP/Tdap/Td (1 - Tdap) Never done   Zoster Vaccines- Shingrix (1 of 2) Never done   DEXA SCAN  Never done   COVID-19 Vaccine (4 - 2023-24 season)  12/02/2021    Colorectal cancer screening: No longer required.   Mammogram status: No longer required due to age out.  pt declined bone density test.   Lung Cancer Screening: (Low Dose CT Chest recommended if Age 71-80 years, 30 pack-year currently smoking OR have quit w/in 15years.) does not qualify.   Lung Cancer Screening Referral: n/a  Additional Screening:  Hepatitis C Screening: does not qualify; Completed 0  Vision Screening: Recommended annual ophthalmology exams for early detection of glaucoma and other disorders of the eye. Is the patient up to date with their annual eye exam?  Yes  Who is the provider or what is the name of the office in which the patient attends annual eye exams? [facility] on [address] If pt is not established with a provider, would they like to be referred to a provider to establish care? No .   Dental Screening: Recommended annual dental exams for proper oral hygiene  Community Resource Referral / Chronic Care Management: CRR required this visit?  No   CCM required this visit?  No      Plan:     I have personally reviewed and noted the following in the patient's chart:   Medical and social history Use of alcohol, tobacco or illicit drugs  Current medications and supplements including opioid prescriptions. Patient is not currently taking opioid prescriptions. Functional ability and status Nutritional status Physical activity Advanced directives List of other physicians Hospitalizations, surgeries, and ER visits in previous 12 months Vitals Screenings to include cognitive, depression, and falls Referrals and appointments  In addition, I have reviewed and discussed with patient certain preventive protocols, quality metrics, and best practice recommendations. A written personalized care plan for preventive services as well as general preventive health recommendations were provided to patient.   List any persons and their role  that are participating in this visit with patient:     Ronnell Freshwater, NP   05/27/2022   Nurse Notes: 25 min non face to face

## 2022-05-10 ENCOUNTER — Other Ambulatory Visit: Payer: Self-pay

## 2022-05-10 DIAGNOSIS — F32A Depression, unspecified: Secondary | ICD-10-CM

## 2022-05-10 MED ORDER — FLUOXETINE HCL 20 MG PO CAPS: 20.0000 mg | ORAL_CAPSULE | Freq: Every day | ORAL | 1 refills | Status: DC

## 2022-05-19 ENCOUNTER — Other Ambulatory Visit: Payer: Self-pay | Admitting: Cardiovascular Disease

## 2022-05-19 DIAGNOSIS — I10 Essential (primary) hypertension: Secondary | ICD-10-CM

## 2022-05-19 DIAGNOSIS — E782 Mixed hyperlipidemia: Secondary | ICD-10-CM

## 2022-06-07 ENCOUNTER — Other Ambulatory Visit: Payer: Self-pay | Admitting: Cardiovascular Disease

## 2022-06-07 DIAGNOSIS — I10 Essential (primary) hypertension: Secondary | ICD-10-CM

## 2022-06-07 DIAGNOSIS — E782 Mixed hyperlipidemia: Secondary | ICD-10-CM

## 2022-06-08 ENCOUNTER — Other Ambulatory Visit: Payer: Self-pay | Admitting: Cardiovascular Disease

## 2022-06-08 DIAGNOSIS — I10 Essential (primary) hypertension: Secondary | ICD-10-CM

## 2022-06-25 NOTE — Progress Notes (Unsigned)
Office Visit    Patient Name: Tracey Walton Date of Encounter: 06/25/2022  Primary Care Provider:  Velva Harman, PA Primary Cardiologist:  Mertie Moores, MD Primary Electrophysiologist: None  Chief Complaint    Tracey Walton is a 82 y.o. female with PMH of aortic stenosis, HTN, HLD, GERD, CKD, hiatal hernia, arthritis who presents today for 1 year follow-up of aortic stenosis.  Past Medical History    Past Medical History:  Diagnosis Date   Anxiety    Arthritis    "qwhere" (08/09/2017)   Chest pain 2011   negative for MI   Chronic kidney disease    renal insufficiency. 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 down after her knee surgery back in 2008   Chronic lower back pain    Depression    Dyspnea    SOB while walking   GERD (gastroesophageal reflux disease)    Heart murmur    History of hiatal hernia    Hyperlipidemia    Hypertension    Osteoporosis    Pneumonia ~ 2014   Polymyalgia ([place])    Strep throat    Past Surgical History:  Procedure Laterality Date   ABDOMINAL HYSTERECTOMY     partial   BILATERAL CARPAL TUNNEL RELEASE  2003-2005   right-left   CATARACT EXTRACTION W/ INTRAOCULAR LENS  IMPLANT, BILATERAL Bilateral    JOINT REPLACEMENT     REVERSE SHOULDER ARTHROPLASTY Left 10/12/2016   Procedure: REVERSE LEFT SHOULDER ARTHROPLASTY;  Surgeon: Justice Britain, MD;  Location: [facility];  Service: Orthopedics;  Laterality: Left;   REVERSE SHOULDER ARTHROPLASTY Right 08/09/2017   Procedure: RIGHT REVERSE SHOULDER ARTHROPLASTY;  Surgeon: Justice Britain, MD;  Location: [facility];  Service: Orthopedics;  Laterality: Right;   SHOULDER ARTHROSCOPY Right 2003   TOOTH EXTRACTION     "1 tooth" (08/09/2017)   TOTAL KNEE ARTHROPLASTY Bilateral     Allergies  Allergies  Allergen Reactions   Penicillins Rash    Pt tolerated 2g ancef without issue 10/12/16    History of Present Illness    Tracey Walton  is a 82 year old female with the above mention past medical history who  presents today for 1 year follow-up of aortic stenosis.  Tracey Walton has been followed by Dr. Acie Fredrickson since 2016 when she was initially seen by her PCP for complaint of hypertension.  She was noted to have poorly controlled hypertension and was started on carvedilol 6.25 mg daily.  She also had complaint of chest pressure and underwent Myoview study that revealed no ischemia and was normal.  2D echo was completed showing EF of 60 to 65% and mild concentric LVH, grade 2 DD, dilated LA and worsening aortic stenosis with gradient of 22 mmHg.  She had a Lexiscan Myoview completed in 2019 showing EF of 69% with no ischemia and was low risk.  She was last seen by Dr. Acie Fredrickson on 07/2021 for annual follow-up. During visit patient had previous episodes of hyponatremia and HCTZ was stopped at that time. She also reported some worsening leg edema.   Tracey Walton presents today for 1 year follow-up alone.  Since last being seen in the office patient reports that she has been doing well with no new cardiac complaints.  Her blood pressure today is well-controlled at 128/78 and heart rate was 65 bpm.  She reports compliance with her current medications and denies any adverse reactions.  She has been experiencing some elevations in blood pressures in the 160s  to [id number] systolically especially in the evening which is also associated with increased headaches.  She denies any indiscretions with salt and does not exercise due to arthritis.  She denies any other associated symptoms with her elevated blood pressures.  She is euvolemic today on exam and has some chronic swelling in the left lower extremity otherwise has no edema or shortness of breath.  Patient denies chest pain, palpitations, dyspnea, PND, orthopnea, nausea, vomiting, dizziness, syncope, edema, weight gain, or early satiety.  Home Medications    Current Outpatient Medications  Medication Sig Dispense Refill   acetaminophen (TYLENOL) 325 MG tablet Take 650 mg by mouth 4  (four) times daily as needed (for pain.).     amLODipine (NORVASC) 2.5 MG tablet TAKE 1 TABLET BY MOUTH EVERY  EVENING PLEASE KEEP UPCOMING  APPOINTMENT IN MARCH FOR FUTURE  REFILLS 30 tablet 11   Baclofen 5 MG TABS Take 1 tablet by mouth 2 (two) times daily as needed.     carvedilol (COREG) 6.25 MG tablet TAKE 1 TABLET BY MOUTH TWICE  DAILY PLEASE KEEP UPCOMING  APPOINTMENT IN MARCH FOR FUTURE  REFILLS 60 tablet 0   cetirizine (ZYRTEC) 10 MG tablet Take 10 mg by mouth daily.     Coenzyme Q10 100 MG capsule Take 200 mg by mouth daily.     estradiol (ESTRACE) 0.1 MG/GM vaginal cream Place vaginally.     ferrous sulfate 325 (65 FE) MG tablet Take 1 tablet (325 mg total) by mouth daily with breakfast. Take on Monday, Wednesday, Fridays 90 tablet 1   FLUoxetine (PROZAC) 20 MG capsule Take 1 capsule (20 mg total) by mouth daily. 14 capsule 0   FLUoxetine (PROZAC) 20 MG capsule Take 1 capsule (20 mg total) by mouth daily. 90 capsule 1   fluticasone (FLONASE) 50 MCG/ACT nasal spray Place 2 sprays into both nostrils daily. 48 g 2   gabapentin (NEURONTIN) 100 MG capsule gabapentin 100 mg capsule  Take 1 capsule twice a day by oral route.  May cause drowsiness     hydrochlorothiazide (HYDRODIURIL) 25 MG tablet TAKE 1 TABLET BY MOUTH DAILY  PLEASE KEEP UPCOMING APPOINTMENT IN MARCH FOR FUTURE REFILLS 30 tablet 0   HYDROcodone-acetaminophen (NORCO/VICODIN) 5-325 MG tablet Take 1 tablet by mouth every 6 (six) hours as needed for moderate pain or severe pain.      methocarbamol (ROBAXIN) 500 MG tablet methocarbamol 500 mg tablet  Take 1 tablet twice a day by oral route as needed.     naproxen (NAPROSYN) 500 MG tablet TAKE 1 TABLET BY MOUTH TWICE  DAILY WITH MEALS 180 tablet 3   omeprazole (PRILOSEC) 20 MG capsule TAKE 1 CAPSULE BY MOUTH DAILY 90 capsule 3   ondansetron (ZOFRAN) 4 MG tablet Take 1 tablet (4 mg total) by mouth every 8 (eight) hours as needed for nausea or vomiting. 20 tablet 0   Polyethyl  Glycol-Propyl Glycol 0.4-0.3 % SOLN Place 1 drop into both eyes 3 (three) times daily as needed (for dry/irritated eyes.).     rosuvastatin (CRESTOR) 20 MG tablet TAKE 1 TABLET BY MOUTH DAILY 30 tablet 11   triamcinolone cream (KENALOG) 0.1 % Apply 1 Application topically 2 (two) times daily. 30 g 0   trimethoprim (TRIMPEX) 100 MG tablet Take 100 mg by mouth daily. At bed time     No current facility-administered medications for this visit.     Review of Systems  Please see the history of present illness.    (+) Headache (+)  Right knee arthritis  All other systems reviewed and are otherwise negative except as noted above.  Physical Exam    Wt Readings from Last 3 Encounters:  04/27/22 206 lb (93.4 kg)  12/27/21 214 lb (97.1 kg)  07/06/21 217 lb 3.2 oz (98.5 kg)   [id number] were no vitals filed for this visit.,There is no height or weight on file to calculate BMI.  Constitutional:      Appearance: Healthy appearance. Not in distress.  Neck:     Vascular: JVD normal.  Pulmonary:     Effort: Pulmonary effort is normal.     Breath sounds: No wheezing. No rales. Diminished in the bases Cardiovascular:     Normal rate. Regular rhythm. Normal S1. Normal S2.      Murmurs: 2/6 systolic murmur Edema:    Peripheral edema absent.  Abdominal:     Palpations: Abdomen is soft non tender. There is no hepatomegaly.  Skin:    General: Skin is warm and dry.  Neurological:     General: No focal deficit present.     Mental Status: Alert and oriented to person, place and time.     Cranial Nerves: Cranial nerves are intact.  EKG/LABS/ Recent Cardiac Studies    ECG personally reviewed by me today -sinus rhythm with rate of 65 bpm and left ventricular hypertrophy with no acute changes consistent with previous EKG.   Lab Results  Component Value Date   WBC 5.2 03/22/2022   HGB 13.4 03/22/2022   HCT 41.2 03/22/2022   MCV 95 03/22/2022   PLT 299 03/22/2022   Lab Results  Component Value  Date   CREATININE 1.01 (H) 03/22/2022   BUN 21 03/22/2022   NA 138 03/22/2022   K 4.9 03/22/2022   CL 100 03/22/2022   CO2 24 03/22/2022   Lab Results  Component Value Date   ALT 14 03/22/2022   AST 15 03/22/2022   ALKPHOS 88 03/22/2022   BILITOT 0.6 03/22/2022   Lab Results  Component Value Date   CHOL 164 03/22/2022   HDL 54 03/22/2022   LDLCALC 98 03/22/2022   TRIG 59 03/22/2022   CHOLHDL 3.0 03/22/2022    Lab Results  Component Value Date   HGBA1C 5.5 03/22/2022    Cardiac Studies & Procedures     STRESS TESTS  MYOCARDIAL PERFUSION IMAGING 12/28/2017  Narrative  Nuclear stress EF: 69%. The left ventricular ejection fraction is hyperdynamic (>65%).  There was no ST segment deviation noted during stress.  Defect 1: There is a medium fixed defect of mild severity present in the basal anteroseptal, mid anteroseptal and apical septal location. This is likely due to artifact. The LV contracts well in this region.  The study is normal. There is no evidence of ischemia. There is no evidence of previous infarction.  This is a low risk study.   ECHOCARDIOGRAM  ECHOCARDIOGRAM COMPLETE 05/28/2019  Narrative ECHOCARDIOGRAM REPORT    Patient Name:   Tracey Walton Date of Exam: 05/28/2019 Medical Rec #:  [id number]        Height:       64.5 in Accession #:    [id number]       Weight:       238.0 lb Date of Birth:  1940/11/21       BSA:          2.118 m Patient Age:    28 years         BP:  148/86 mmHg Patient Gender: F                HR:           87 bpm. Exam Location:  [address]  Procedure: 2D Echo, Cardiac Doppler and Color Doppler  Indications:    I35.0 Nonrheumatic aortic (valve) stenosis  History:        Patient has prior history of Echocardiogram examinations, most recent 12/27/2017. Signs/Symptoms:Murmur; Risk Factors:Hypertension and Dyslipidemia. Chest pressure.  Sonographer:    Diamond Nickel RCS Referring Phys: [place]   Sonographer Comments: Image acquisition challenging due to patient body habitus and technically difficult to position patient. IMPRESSIONS   1. Large hiatal hernia seen in parasternal views. Recommend clinical correlation. 2. Left ventricular ejection fraction, by estimation, is 55 to 60%. The left ventricle has normal function. The left ventricle has no regional wall motion abnormalities. There is moderate concentric left ventricular hypertrophy. Left ventricular diastolic parameters are consistent with Grade I diastolic dysfunction (impaired relaxation). 3. Right ventricular systolic function is normal. The right ventricular size is normal. There is mildly elevated pulmonary artery systolic pressure. 4. Left atrial size was moderately dilated. 5. The mitral valve is normal in structure and function. Mild to moderate mitral valve regurgitation. 6. Fixed noncoronary cusp. The aortic valve is tricuspid. Aortic valve regurgitation is mild. Mild aortic valve stenosis. 7. The inferior vena cava is normal in size with <50% respiratory variability, suggesting right atrial pressure of 8 mmHg.  FINDINGS Left Ventricle: Left ventricular ejection fraction, by estimation, is 55 to 60%. The left ventricle has normal function. The left ventricle has no regional wall motion abnormalities. The left ventricular internal cavity size was normal in size. There is moderate concentric left ventricular hypertrophy. Left ventricular diastolic parameters are consistent with Grade I diastolic dysfunction (impaired relaxation).  Right Ventricle: The right ventricular size is normal. Right vetricular wall thickness was not assessed. Right ventricular systolic function is normal. There is mildly elevated pulmonary artery systolic pressure. The tricuspid regurgitant velocity is 2.66 m/s, and with an assumed right atrial pressure of 8 mmHg, the estimated right ventricular systolic pressure is [id number] mmHg.  Left  Atrium: Left atrial size was moderately dilated.  Right Atrium: Right atrial size was not well visualized.  Pericardium: There is no evidence of pericardial effusion.  Mitral Valve: The mitral valve is normal in structure and function. Mild to moderate mitral annular calcification. Mild to moderate mitral valve regurgitation.  Tricuspid Valve: The tricuspid valve is normal in structure. Tricuspid valve regurgitation is trivial. No evidence of tricuspid stenosis.  Aortic Valve: Fixed noncoronary cusp. The aortic valve is tricuspid. Aortic valve regurgitation is mild. Mild aortic stenosis is present. Aortic valve mean gradient measures 17.0 mmHg. Aortic valve peak gradient measures 33.2 mmHg. Aortic valve area, by VTI measures 1.70 cm.  Pulmonic Valve: The pulmonic valve was grossly normal. Pulmonic valve regurgitation is not visualized. No evidence of pulmonic stenosis.  Aorta: The aortic root, ascending aorta and aortic arch are all structurally normal, with no evidence of dilitation or obstruction.  Venous: The inferior vena cava is normal in size with less than 50% respiratory variability, suggesting right atrial pressure of 8 mmHg.  IAS/Shunts: No atrial level shunt detected by color flow Doppler.   LEFT VENTRICLE PLAX 2D LVIDd:         2.60 cm  Diastology LVIDs:         1.90 cm  LV e' lateral:  7.51 cm/s LV PW:         1.50 cm  LV E/e' lateral: 9.0 LV IVS:        1.40 cm  LV e' medial:    5.66 cm/s LVOT diam:     2.00 cm  LV E/e' medial:  11.9 LV SV:         101 LV SV Index:   48 LVOT Area:     3.14 cm   RIGHT VENTRICLE RV S prime:     11.60 cm/s RVSP:           31.3 mmHg  LEFT ATRIUM             Index       RIGHT ATRIUM LA diam:        2.10 cm 0.99 cm/m  RA Pressure: 3.00 mmHg LA Vol (A2C):   72.1 ml 34.05 ml/m LA Vol (A4C):   99.2 ml 46.84 ml/m LA Biplane Vol: 84.8 ml 40.04 ml/m AORTIC VALVE AV Area (Vmax):    1.52 cm AV Area (Vmean):   1.65 cm AV Area  (VTI):     1.70 cm AV Vmax:           288.00 cm/s AV Vmean:          192.000 cm/s AV VTI:            0.592 m AV Peak Grad:      33.2 mmHg AV Mean Grad:      17.0 mmHg LVOT Vmax:         139.00 cm/s LVOT Vmean:        101.000 cm/s LVOT VTI:          0.321 m LVOT/AV VTI ratio: 0.54  AORTA Ao Root diam: 2.70 cm  MITRAL VALVE                TRICUSPID VALVE MV Area (PHT): 3.34 cm     TR Peak grad:   28.3 mmHg MV Decel Time: 227 msec     TR Vmax:        266.00 cm/s MV E velocity: 67.30 cm/s   Estimated RAP:  3.00 mmHg MV A velocity: 100.00 cm/s  RVSP:           31.3 mmHg MV E/A ratio:  0.67 SHUNTS Systemic VTI:  0.32 m Systemic Diam: 2.00 cm  Buford Dresser MD Electronically signed by Buford Dresser MD Signature Date/Time: 05/28/2019/7:45:19 PM    Final             Assessment & Plan    1.  Mild nonrheumatic aortic stenosis: -Most recent 2D echo completed 05/2019 showing stable aortic stenosis with mild MR and EF of 55-60%. -Today patient reports no shortness of breath, angina, or presyncope since previous visit. -We will repeat 2D echo to evaluate aortic stenosis -Patient is euvolemic on exam today  2.  Essential hypertension: -Patient's blood pressure today is well-controlled at 128/78 -She does note elevated blood pressures in the evening sometimes in the [id number] systolically. -We will increase amlodipine to 5 mg daily -Continue carvedilol 6.25 mg twice daily -Patient will document blood pressures over the next 2 weeks and report findings back to our office.  3.  Bilateral lower extremity edema: -Today patient has trace lower extremity edema and her left lower extremity and none in the right lower extremity. -I advised her to use compression stockings and elevate extremities when dependent.  4.  Hyperlipidemia: -Patient's most recent  LDL cholesterol was 98 above goal of less than 70 -Continue Crestor 20 mg daily  5.  Dyspnea on exertion: -Patient  is euvolemic today and denies any dyspnea on exertion. -Continue current medications as prescribed  Disposition: Follow-up with Mertie Moores, MD or APP in 12 months    Medication Adjustments/Labs and Tests Ordered: Current medicines are reviewed at length with the patient today.  Concerns regarding medicines are outlined above.   Signed, Mable Fill, Marissa Nestle, NP 06/25/2022, 8:56 AM [facility]  Note:  This document was prepared using Dragon voice recognition software and may include unintentional dictation errors.

## 2022-06-26 ENCOUNTER — Ambulatory Visit: Payer: Medicare Other | Attending: Nurse Practitioner | Admitting: Nurse Practitioner

## 2022-06-26 ENCOUNTER — Encounter: Payer: Self-pay | Admitting: Nurse Practitioner

## 2022-06-26 VITALS — BP 128/78 | HR 65 | Ht 63.0 in | Wt 209.2 lb

## 2022-06-26 DIAGNOSIS — R0609 Other forms of dyspnea: Secondary | ICD-10-CM

## 2022-06-26 DIAGNOSIS — I10 Essential (primary) hypertension: Secondary | ICD-10-CM

## 2022-06-26 DIAGNOSIS — I350 Nonrheumatic aortic (valve) stenosis: Secondary | ICD-10-CM

## 2022-06-26 DIAGNOSIS — E782 Mixed hyperlipidemia: Secondary | ICD-10-CM | POA: Diagnosis not present

## 2022-06-26 DIAGNOSIS — R600 Localized edema: Secondary | ICD-10-CM

## 2022-06-26 MED ORDER — AMLODIPINE BESYLATE 5 MG PO TABS: 5.0000 mg | ORAL_TABLET | Freq: Every day | ORAL | 2 refills | Status: DC

## 2022-06-26 NOTE — Patient Instructions (Addendum)
Medication Instructions:  INCREASE Norvasc to 5mg  take 1 tablet once a day  *If you need a refill on your cardiac medications before your next appointment, please call your pharmacy*   Lab Work: None ordered   Testing/Procedures: Your physician has requested that you have an echocardiogram. Echocardiography is a painless test that uses sound waves to create images of your heart. It provides your doctor with information about the size and shape of your heart and how well your heart's chambers and valves are working. This procedure takes approximately one hour. There are no restrictions for this procedure. Please do NOT wear cologne, perfume, aftershave, or lotions (deodorant is allowed). Please arrive 15 minutes prior to your appointment time.   Follow-Up: At [facility], you and your health needs are our priority.  As part of our continuing mission to provide you with exceptional heart care, we have created designated Provider Care Teams.  These Care Teams include your primary Cardiologist (physician) and Advanced Practice Providers (APPs -  Physician Assistants and Nurse Practitioners) who all work together to provide you with the care you need, when you need it.  We recommend signing up for the patient portal called "MyChart".  Sign up information is provided on this After Visit Summary.  MyChart is used to connect with patients for Virtual Visits (Telemedicine).  Patients are able to view lab/test results, encounter notes, upcoming appointments, etc.  Non-urgent messages can be sent to your provider as well.   To learn more about what you can do with MyChart, go to [email or web address].    Your next appointment:   12 month(s)  Provider:   Mertie Moores, MD     Other Instructions  Check your blood pressure daily for 2 weeks, then contact the office with your readings. YOUR BLOOD PRESSURE GOAL IS 140/80 OR LESS Please monitor blood pressures and keep a log of your readings.    Make sure to check 2 hours after your medications.   AVOID these things for 30 minutes before checking your blood pressure: No Drinking caffeine. No Drinking alcohol. No Eating. No Smoking. No Exercising.  Five minutes before checking your blood pressure: Pee. Sit in a dining chair. Avoid sitting in a soft couch or armchair. Be quiet. Do not talk.

## 2022-06-28 ENCOUNTER — Other Ambulatory Visit: Payer: Self-pay | Admitting: Cardiovascular Disease

## 2022-06-28 DIAGNOSIS — I10 Essential (primary) hypertension: Secondary | ICD-10-CM

## 2022-07-03 ENCOUNTER — Telehealth (HOSPITAL_COMMUNITY): Payer: Self-pay | Admitting: Nurse Practitioner

## 2022-07-03 NOTE — Telephone Encounter (Signed)
I called to schedule ordered echocardiogram and she did not wish to schedule at this time. She may wait til the summer to have due to she keeps her great grandchildren. She states she will call me back when she is ready to schedule. Order will be removed from the active echo WQ. Thank you.

## 2022-07-10 DIAGNOSIS — H6503 Acute serous otitis media, bilateral: Secondary | ICD-10-CM | POA: Diagnosis not present

## 2022-07-10 DIAGNOSIS — R110 Nausea: Secondary | ICD-10-CM | POA: Diagnosis not present

## 2022-07-10 DIAGNOSIS — I10 Essential (primary) hypertension: Secondary | ICD-10-CM | POA: Diagnosis not present

## 2022-07-10 DIAGNOSIS — R42 Dizziness and giddiness: Secondary | ICD-10-CM | POA: Diagnosis not present

## 2022-07-13 ENCOUNTER — Ambulatory Visit (INDEPENDENT_AMBULATORY_CARE_PROVIDER_SITE_OTHER): Payer: Medicare Other | Admitting: Family Medicine

## 2022-07-13 ENCOUNTER — Encounter: Payer: Self-pay | Admitting: Family Medicine

## 2022-07-13 VITALS — BP 153/79 | HR 75 | Resp 18 | Ht 63.0 in | Wt 209.0 lb

## 2022-07-13 DIAGNOSIS — I10 Essential (primary) hypertension: Secondary | ICD-10-CM

## 2022-07-13 DIAGNOSIS — R42 Dizziness and giddiness: Secondary | ICD-10-CM | POA: Diagnosis not present

## 2022-07-13 DIAGNOSIS — R21 Rash and other nonspecific skin eruption: Secondary | ICD-10-CM

## 2022-07-13 MED ORDER — TRIAMCINOLONE ACETONIDE 0.1 % EX CREA
1.0000 | TOPICAL_CREAM | Freq: Two times a day (BID) | CUTANEOUS | 0 refills | Status: DC
Start: 2022-07-13 — End: 2023-01-26

## 2022-07-13 MED ORDER — MECLIZINE HCL 12.5 MG PO TABS
12.5000 mg | ORAL_TABLET | Freq: Three times a day (TID) | ORAL | 0 refills | Status: AC
Start: 2022-07-13 — End: 2022-07-16

## 2022-07-13 MED ORDER — PREDNISONE 10 MG (21) PO TBPK: ORAL_TABLET | ORAL | 0 refills | Status: DC

## 2022-07-13 MED ORDER — PREDNISONE 10 MG (21) PO TBPK
ORAL_TABLET | ORAL | 0 refills | Status: DC
Start: 2022-07-15 — End: 2023-01-26

## 2022-07-13 NOTE — Assessment & Plan Note (Signed)
Blood pressure above goal of less than 130/80 today at 153/79.  Blood pressures have been stable and within goal including at most recent cardiology appointment when blood pressure was 128/78.  Patient states that blood pressures were similar at home and did not increase until the spinning started.  Maintain current regimen of amlodipine 5 mg daily and carvedilol 6.25 mg twice daily.  Continue ambulatory blood pressure monitoring at home and will alert me if consistently above goal even after head imaging and medication course for vertigo.

## 2022-07-13 NOTE — Assessment & Plan Note (Signed)
Given patient's current presentation of continuous vertigo without hearing loss, I suspect vestibular neuritis may be the cause.  Also on my differential are labyrinthitis, BPPV, Mnire's disease, dehydration, vascular abnormality of the head.  Given that she has vertigo as well as the risk factors of being more than 82 years old and vascular risk factors of hyperlipidemia and hypertension, recommend MRI of the head to rule out any vascular abnormalities.  Given recent increases in blood pressure, would like to get imaging to rule this out prior to starting other treatment options.  Otherwise, would treat vestibular neuritis with prednisone taper to reduce inflammation.  Also provided refill of 3-day course of meclizine.  We discussed that taking meclizine longer is not recommended as it may impede adaptation and recovery.  Patient verbalized understanding and is agreeable to this plan.  Her only request is that the MRI is done in network.  If workup is negative and treatment with prednisone is not helpful, patient is agreeable to a referral to ENT at that point.  She will give the office a call after she finishes the prednisone course.

## 2022-07-13 NOTE — Progress Notes (Signed)
Acute Office Visit  Subjective:     Patient ID: Tracey Walton, female    DOB: 03-17-1941, 82 y.o.   MRN: [id number]  Chief Complaint  Patient presents with   Dizziness         HPI Patient is in today for follow-up on dizziness.  Previously visited urgent care on 07/10/2022 for evaluation and was given Antivert.  This was helpful, but they did not provide a long supply.  Today, she does feel a lot better.  She endorses that the spinning sensation started on 07/06/2022 and gradually worsened.  It was continuous with no associated changes in hearing and is not associated with particular movements or positions.  Review of Systems  Constitutional:  Negative for chills, fever and malaise/fatigue.  HENT:  Negative for congestion, ear discharge, ear pain, hearing loss, sinus pain, sore throat and tinnitus.   Eyes:  Negative for blurred vision and double vision.  Respiratory:  Negative for cough and shortness of breath.   Cardiovascular:  Negative for chest pain and palpitations.  Skin:  Positive for rash (Rash on underside of breasts has not resolved, waxes and wanes).  Neurological:  Positive for dizziness (Spinning sensation). Negative for sensory change, speech change, focal weakness, loss of consciousness and headaches.     Objective:    BP (!) 153/79 (BP Location: Left Arm, Patient Position: Sitting, Cuff Size: Large)   Pulse 75   Resp 18   Ht 5\' 3"  (1.6 m)   Wt 209 lb (94.8 kg)   SpO2 94%   BMI 37.02 kg/m   Physical Exam Constitutional:      General: She is not in acute distress.    Appearance: Normal appearance.  HENT:     Head: Normocephalic and atraumatic.     Right Ear: Tympanic membrane, ear canal and external ear normal.     Left Ear: Tympanic membrane, ear canal and external ear normal.     Nose: Nose normal.  Eyes:     Extraocular Movements: Extraocular movements intact.     Conjunctiva/sclera: Conjunctivae normal.     Pupils: Pupils are equal, round, and  reactive to light.  Cardiovascular:     Rate and Rhythm: Normal rate and regular rhythm.     Pulses: Normal pulses.     Heart sounds: Murmur (Blowing systolic murmur) heard.     No friction rub. No gallop.  Pulmonary:     Effort: Pulmonary effort is normal. No respiratory distress.     Breath sounds: No wheezing, rhonchi or rales.  Musculoskeletal:     Cervical back: Normal range of motion.  Skin:    General: Skin is warm and dry.  Neurological:     Mental Status: She is alert and oriented to person, place, and time. Mental status is at baseline.     Cranial Nerves: No cranial nerve deficit.     Sensory: No sensory deficit.     Motor: No weakness.  Psychiatric:        Mood and Affect: Mood normal.        Behavior: Behavior normal.      Assessment & Plan:  Vertigo Assessment & Plan: Given patient's current presentation of continuous vertigo without hearing loss, I suspect vestibular neuritis may be the cause.  Also on my differential are labyrinthitis, BPPV, Mnire's disease, dehydration, vascular abnormality of the head.  Given that she has vertigo as well as the risk factors of being more than 82 years old and vascular  risk factors of hyperlipidemia and hypertension, recommend MRI of the head to rule out any vascular abnormalities.  Given recent increases in blood pressure, would like to get imaging to rule this out prior to starting other treatment options.  Otherwise, would treat vestibular neuritis with prednisone taper to reduce inflammation.  Also provided refill of 3-day course of meclizine.  We discussed that taking meclizine longer is not recommended as it may impede adaptation and recovery.  Patient verbalized understanding and is agreeable to this plan.  Her only request is that the MRI is done in network.  If workup is negative and treatment with prednisone is not helpful, patient is agreeable to a referral to ENT at that point.  She will give the office a call after she  finishes the prednisone course.  Orders: -     Meclizine HCl; Take 1 tablet (12.5 mg total) by mouth 3 (three) times daily for 3 days.  Dispense: 9 tablet; Refill: 0 -     MR BRAIN W WO CONTRAST; Future -     predniSONE; 6 day taper - take by mouth as directed for 6 days  Dispense: 21 tablet; Refill: 0  Essential hypertension Assessment & Plan: Blood pressure above goal of less than 130/80 today at 153/79.  Blood pressures have been stable and within goal including at most recent cardiology appointment when blood pressure was 128/78.  Patient states that blood pressures were similar at home and did not increase until the spinning started.  Maintain current regimen of amlodipine 5 mg daily and carvedilol 6.25 mg twice daily.  Continue ambulatory blood pressure monitoring at home and will alert me if consistently above goal even after head imaging and medication course for vertigo.   Rash and nonspecific skin eruption -     Triamcinolone Acetonide; Apply 1 Application topically 2 (two) times daily.  Dispense: 80 g; Refill: 0  Rash under her breasts still has not resolved.  Provided refill of triamcinolone cream for flares.  Return if symptoms worsen or fail to improve.  Melida Quitter, PA

## 2022-08-05 ENCOUNTER — Other Ambulatory Visit: Payer: Medicare Other

## 2022-08-25 ENCOUNTER — Other Ambulatory Visit (HOSPITAL_COMMUNITY): Payer: Medicare Other

## 2022-08-29 ENCOUNTER — Ambulatory Visit: Payer: Medicare Other | Admitting: Family Medicine

## 2022-09-05 ENCOUNTER — Ambulatory Visit (HOSPITAL_COMMUNITY): Payer: Medicare Other | Attending: Internal Medicine

## 2022-09-05 DIAGNOSIS — R0609 Other forms of dyspnea: Secondary | ICD-10-CM | POA: Diagnosis not present

## 2022-09-05 DIAGNOSIS — I350 Nonrheumatic aortic (valve) stenosis: Secondary | ICD-10-CM | POA: Diagnosis not present

## 2022-09-05 DIAGNOSIS — E782 Mixed hyperlipidemia: Secondary | ICD-10-CM | POA: Diagnosis not present

## 2022-09-05 DIAGNOSIS — R600 Localized edema: Secondary | ICD-10-CM | POA: Diagnosis not present

## 2022-09-05 DIAGNOSIS — I10 Essential (primary) hypertension: Secondary | ICD-10-CM | POA: Diagnosis not present

## 2022-09-05 LAB — ECHOCARDIOGRAM COMPLETE
AV Mean grad: 22.8 mmHg
AV Peak grad: 38.8 mmHg
Ao pk vel: 3.11 m/s
Area-P 1/2: 4.49 cm2
P 1/2 time: 444 msec
S' Lateral: 2.5 cm

## 2022-10-04 ENCOUNTER — Other Ambulatory Visit: Payer: Self-pay | Admitting: Nurse Practitioner

## 2022-10-04 DIAGNOSIS — F32A Depression, unspecified: Secondary | ICD-10-CM

## 2022-10-09 DIAGNOSIS — M5416 Radiculopathy, lumbar region: Secondary | ICD-10-CM | POA: Diagnosis not present

## 2022-10-16 DIAGNOSIS — N3021 Other chronic cystitis with hematuria: Secondary | ICD-10-CM | POA: Diagnosis not present

## 2022-10-16 DIAGNOSIS — R8271 Bacteriuria: Secondary | ICD-10-CM | POA: Diagnosis not present

## 2022-10-23 DIAGNOSIS — G894 Chronic pain syndrome: Secondary | ICD-10-CM | POA: Diagnosis not present

## 2022-11-02 DIAGNOSIS — M48062 Spinal stenosis, lumbar region with neurogenic claudication: Secondary | ICD-10-CM | POA: Diagnosis not present

## 2022-11-17 DIAGNOSIS — N3021 Other chronic cystitis with hematuria: Secondary | ICD-10-CM | POA: Diagnosis not present

## 2022-11-30 ENCOUNTER — Other Ambulatory Visit: Payer: Self-pay | Admitting: Nurse Practitioner

## 2022-12-01 DIAGNOSIS — M48062 Spinal stenosis, lumbar region with neurogenic claudication: Secondary | ICD-10-CM | POA: Diagnosis not present

## 2022-12-01 DIAGNOSIS — Z006 Encounter for examination for normal comparison and control in clinical research program: Secondary | ICD-10-CM | POA: Diagnosis not present

## 2022-12-11 ENCOUNTER — Encounter: Payer: Self-pay | Admitting: Family Medicine

## 2022-12-11 DIAGNOSIS — J40 Bronchitis, not specified as acute or chronic: Secondary | ICD-10-CM | POA: Diagnosis not present

## 2022-12-11 DIAGNOSIS — Z96612 Presence of left artificial shoulder joint: Secondary | ICD-10-CM | POA: Diagnosis not present

## 2022-12-11 DIAGNOSIS — Z96611 Presence of right artificial shoulder joint: Secondary | ICD-10-CM | POA: Diagnosis not present

## 2022-12-11 DIAGNOSIS — R0609 Other forms of dyspnea: Secondary | ICD-10-CM | POA: Diagnosis not present

## 2022-12-11 DIAGNOSIS — R918 Other nonspecific abnormal finding of lung field: Secondary | ICD-10-CM | POA: Diagnosis not present

## 2022-12-21 ENCOUNTER — Other Ambulatory Visit: Payer: Self-pay | Admitting: Nurse Practitioner

## 2022-12-21 DIAGNOSIS — F32A Depression, unspecified: Secondary | ICD-10-CM

## 2023-01-03 DIAGNOSIS — S83412A Sprain of medial collateral ligament of left knee, initial encounter: Secondary | ICD-10-CM | POA: Diagnosis not present

## 2023-01-03 DIAGNOSIS — Z96652 Presence of left artificial knee joint: Secondary | ICD-10-CM | POA: Diagnosis not present

## 2023-01-04 DIAGNOSIS — M48062 Spinal stenosis, lumbar region with neurogenic claudication: Secondary | ICD-10-CM | POA: Diagnosis not present

## 2023-01-26 ENCOUNTER — Encounter: Payer: Self-pay | Admitting: Family Medicine

## 2023-01-26 ENCOUNTER — Ambulatory Visit (INDEPENDENT_AMBULATORY_CARE_PROVIDER_SITE_OTHER): Payer: Medicare Other | Admitting: Family Medicine

## 2023-01-26 VITALS — BP 134/75 | HR 67 | Resp 18 | Ht 63.0 in | Wt 198.0 lb

## 2023-01-26 DIAGNOSIS — J309 Allergic rhinitis, unspecified: Secondary | ICD-10-CM | POA: Diagnosis not present

## 2023-01-26 DIAGNOSIS — R7303 Prediabetes: Secondary | ICD-10-CM | POA: Diagnosis not present

## 2023-01-26 DIAGNOSIS — F32A Depression, unspecified: Secondary | ICD-10-CM | POA: Diagnosis not present

## 2023-01-26 DIAGNOSIS — E782 Mixed hyperlipidemia: Secondary | ICD-10-CM | POA: Diagnosis not present

## 2023-01-26 DIAGNOSIS — R21 Rash and other nonspecific skin eruption: Secondary | ICD-10-CM

## 2023-01-26 DIAGNOSIS — L570 Actinic keratosis: Secondary | ICD-10-CM

## 2023-01-26 DIAGNOSIS — E669 Obesity, unspecified: Secondary | ICD-10-CM

## 2023-01-26 DIAGNOSIS — I10 Essential (primary) hypertension: Secondary | ICD-10-CM | POA: Diagnosis not present

## 2023-01-26 MED ORDER — FLUTICASONE PROPIONATE 50 MCG/ACT NA SUSP: 2.0000 | Freq: Every day | NASAL | 2 refills | Status: AC

## 2023-01-26 MED ORDER — SALICYLIC ACID 2 % EX SHAM: MEDICATED_SHAMPOO | Freq: Every day | CUTANEOUS | 3 refills | Status: DC | PRN

## 2023-01-26 MED ORDER — FLUOXETINE HCL 20 MG PO CAPS
20.0000 mg | ORAL_CAPSULE | Freq: Every day | ORAL | 3 refills | Status: DC
Start: 2023-01-26 — End: 2024-01-20

## 2023-01-26 MED ORDER — TRIAMCINOLONE ACETONIDE 0.1 % EX CREA
1.0000 | TOPICAL_CREAM | Freq: Two times a day (BID) | CUTANEOUS | 3 refills | Status: DC
Start: 2023-01-26 — End: 2023-12-20

## 2023-01-26 NOTE — Assessment & Plan Note (Signed)
Patient states she is happy with her regimen and does not wish to change at this time.  Continue Prozac 20 mg daily.  Will continue to monitor.

## 2023-01-26 NOTE — Progress Notes (Signed)
Established Patient Office Visit  Subjective   Patient ID: Tracey Walton, female    DOB: 1940/12/07  Age: 82 y.o. MRN: [id number]  Chief Complaint  Patient presents with   Anxiety   Depression   Hypertension    HPI Tracey Walton is a 82 y.o. female presenting today for follow up of hypertension, mood.  She went to urgent care with [facility] in early September for shortness of breath and was treated for bronchitis with antibiotics and prednisone.  She notes that since the prednisone course, she has had no issues with the vertigo that she was complaining of 6 months ago.  Positive response to oral steroids increases likelihood of vestibular neuritis.  She did not ever have the MRI done as she did not want to find out if there was anything wrong with her brain. Hypertension:   Pt denies chest pain, SOB, dizziness, edema, syncope, fatigue or heart palpitations. Taking amlodipine and carvedilol, reports excellent compliance with treatment. Denies side effects. Mood: Patient is here to follow up for depression and anxiety, currently managing with Prozac 20 mg daily and is very happy with her current routine. Taking medication without side effects, reports excellent compliance with treatment. Denies mood changes or SI/HI. She feels mood is stable since last visit.      01/26/2023   11:34 AM 04/27/2022    2:26 PM 12/27/2021    1:22 PM  Depression screen PHQ 2/9  Decreased Interest 0 0 0  Down, Depressed, Hopeless 1 0 1  PHQ - 2 Score 1 0 1  Altered sleeping 3 0 1  Tired, decreased energy 2 0 1  Change in appetite 1 0 1  Feeling bad or failure about yourself  0 0 0  Trouble concentrating 1 0 1  Moving slowly or fidgety/restless 0 0 1  Suicidal thoughts 0 0 0  PHQ-9 Score 8 0 6  Difficult doing work/chores Somewhat difficult  Not difficult at all       01/26/2023   11:34 AM 04/27/2022    2:27 PM 12/27/2021    1:23 PM 11/10/2020    9:37 AM  GAD 7 : Generalized Anxiety Score  Nervous,  Anxious, on Edge 1 0 1 1  Control/stop worrying 1 1 1 1   Worry too much - different things 1 1 1  0  Trouble relaxing 1 0 1 1  Restless 0 0 1 0  Easily annoyed or irritable 0 0 1 0  Afraid - awful might happen 0 1 0 0  Total GAD 7 Score 4 3 6 3   Anxiety Difficulty Somewhat difficult  Not difficult at all     Outpatient Medications Prior to Visit  Medication Sig   acetaminophen (TYLENOL) 325 MG tablet Take 650 mg by mouth 4 (four) times daily as needed (for pain.).   amLODipine (NORVASC) 5 MG tablet TAKE 1 TABLET BY MOUTH DAILY   Baclofen 5 MG TABS Take 1 tablet by mouth 2 (two) times daily as needed.   carvedilol (COREG) 6.25 MG tablet TAKE 1 TABLET BY MOUTH TWICE  DAILY   cetirizine (ZYRTEC) 10 MG tablet Take 10 mg by mouth daily.   ferrous sulfate 325 (65 FE) MG tablet Take 1 tablet (325 mg total) by mouth daily with breakfast. Take on Monday, Wednesday, Fridays   gabapentin (NEURONTIN) 100 MG capsule gabapentin 100 mg capsule  Take 1 capsule twice a day by oral route.  May cause drowsiness   methocarbamol (ROBAXIN) 500 MG tablet  methocarbamol 500 mg tablet  Take 1 tablet twice a day by oral route as needed.   naproxen (NAPROSYN) 500 MG tablet TAKE 1 TABLET BY MOUTH TWICE  DAILY WITH MEALS   omeprazole (PRILOSEC) 20 MG capsule TAKE 1 CAPSULE BY MOUTH DAILY   ondansetron (ZOFRAN) 4 MG tablet Take 1 tablet (4 mg total) by mouth every 8 (eight) hours as needed for nausea or vomiting.   Polyethyl Glycol-Propyl Glycol 0.4-0.3 % SOLN Place 1 drop into both eyes 3 (three) times daily as needed (for dry/irritated eyes.).   rosuvastatin (CRESTOR) 20 MG tablet TAKE 1 TABLET BY MOUTH DAILY   trimethoprim (TRIMPEX) 100 MG tablet Take 100 mg by mouth daily. At bed time   [DISCONTINUED] FLUoxetine (PROZAC) 20 MG capsule TAKE 1 CAPSULE BY MOUTH DAILY   [DISCONTINUED] fluticasone (FLONASE) 50 MCG/ACT nasal spray Place 2 sprays into both nostrils daily.   [DISCONTINUED] HYDROcodone-acetaminophen  (NORCO/VICODIN) 5-325 MG tablet Take 1 tablet by mouth every 6 (six) hours as needed for moderate pain or severe pain.    [DISCONTINUED] predniSONE (STERAPRED UNI-PAK 21 TAB) 10 MG (21) TBPK tablet 6 day taper - take by mouth as directed for 6 days   [DISCONTINUED] triamcinolone cream (KENALOG) 0.1 % Apply 1 Application topically 2 (two) times daily.   No facility-administered medications prior to visit.    ROS Negative unless otherwise noted in HPI   Objective:     BP 134/75 (BP Location: Left Arm, Patient Position: Sitting, Cuff Size: Large)   Pulse 67   Resp 18   Ht 5\' 3"  (1.6 m)   Wt 198 lb (89.8 kg)   SpO2 95%   BMI 35.07 kg/m   Physical Exam Constitutional:      General: She is not in acute distress.    Appearance: Normal appearance.  HENT:     Head: Normocephalic and atraumatic.  Cardiovascular:     Rate and Rhythm: Normal rate and regular rhythm.     Heart sounds: Murmur (systolic) heard.     No friction rub. No gallop.  Pulmonary:     Effort: Pulmonary effort is normal. No respiratory distress.     Breath sounds: Normal breath sounds. No wheezing, rhonchi or rales.  Skin:    General: Skin is warm and dry.     Comments: Flat, hyperkeratotic, scaly plaques ranging from red to tan in color scattered over crown of head. 2 cm tan, nodular hyperkeratotic, scaly lesion in hairline over left temple.  Neurological:     Mental Status: She is alert and oriented to person, place, and time.     Assessment & Plan:  Essential hypertension Assessment & Plan: BP goal <140/90.  Stable, at goal.  Continue amlodipine 5 mg daily, carvedilol 6.25 mg twice daily.  Also followed by cardiology.  Continue ambulatory blood pressure monitoring, will collect CMP for electrolytes and renal function.   Depression, unspecified depression type Assessment & Plan: Patient states she is happy with her regimen and does not wish to change at this time.  Continue Prozac 20 mg daily.  Will  continue to monitor.  Orders: -     FLUoxetine HCl; Take 1 capsule (20 mg total) by mouth daily.  Dispense: 90 capsule; Refill: 3  Allergic rhinitis, unspecified seasonality, unspecified trigger Assessment & Plan: Stable. Continue oral antihistamine and Flonase.   Orders: -     Fluticasone Propionate; Place 2 sprays into both nostrils daily.  Dispense: 48 g; Refill: 2  Rash and nonspecific skin  eruption -     Triamcinolone Acetonide; Apply 1 Application topically 2 (two) times daily.  Dispense: 80 g; Refill: 3  Keratotic lesion -     Ambulatory referral to Dermatology -     Salicylic Acid; Apply topically daily as needed for itching.  Dispense: 236 mL; Refill: 3  Providing salicylic acid shampoo for keratotic lesions of the scalp.  Referral to dermatology for keratotic lesion in hairline specifically to rule out skin malignancy.  Return in about 1 week (around 02/02/2023) for fasting blood work; 6 months for HTN, mood, fasting labs 1 week before.    Melida Quitter, PA

## 2023-01-26 NOTE — Assessment & Plan Note (Signed)
-  Stable. Continue oral antihistamine and Flonase.

## 2023-01-26 NOTE — Assessment & Plan Note (Signed)
BP goal <140/90.  Stable, at goal.  Continue amlodipine 5 mg daily, carvedilol 6.25 mg twice daily.  Also followed by cardiology.  Continue ambulatory blood pressure monitoring, will collect CMP for electrolytes and renal function.

## 2023-01-26 NOTE — Patient Instructions (Addendum)
I am actually going to put in a referral for you to see a dermatologist so that they can get a better look at the spots on your scalp.  I will also send a shampoo for you to try to see if it offers any relief until you see dermatology.

## 2023-02-01 ENCOUNTER — Other Ambulatory Visit: Payer: Medicare Other

## 2023-02-01 DIAGNOSIS — E782 Mixed hyperlipidemia: Secondary | ICD-10-CM | POA: Diagnosis not present

## 2023-02-01 DIAGNOSIS — R7303 Prediabetes: Secondary | ICD-10-CM | POA: Diagnosis not present

## 2023-02-01 DIAGNOSIS — E669 Obesity, unspecified: Secondary | ICD-10-CM

## 2023-02-01 DIAGNOSIS — I10 Essential (primary) hypertension: Secondary | ICD-10-CM

## 2023-02-02 ENCOUNTER — Other Ambulatory Visit: Payer: Medicare Other

## 2023-02-02 ENCOUNTER — Other Ambulatory Visit: Payer: Self-pay | Admitting: Family Medicine

## 2023-02-02 DIAGNOSIS — I10 Essential (primary) hypertension: Secondary | ICD-10-CM

## 2023-02-02 LAB — CBC WITH DIFFERENTIAL/PLATELET
Basophils Absolute: 0 10*3/uL (ref 0.0–0.2)
Basos: 1 %
EOS (ABSOLUTE): 0.2 10*3/uL (ref 0.0–0.4)
Eos: 6 %
Hematocrit: 39.4 % (ref 34.0–46.6)
Hemoglobin: 12.5 g/dL (ref 11.1–15.9)
Immature Grans (Abs): 0 10*3/uL (ref 0.0–0.1)
Immature Granulocytes: 1 %
Lymphocytes Absolute: 1.2 10*3/uL (ref 0.7–3.1)
Lymphs: 29 %
MCH: 31.3 pg (ref 26.6–33.0)
MCHC: 31.7 g/dL (ref 31.5–35.7)
MCV: 99 fL — ABNORMAL HIGH (ref 79–97)
Monocytes Absolute: 0.6 10*3/uL (ref 0.1–0.9)
Monocytes: 14 %
Neutrophils Absolute: 2 10*3/uL (ref 1.4–7.0)
Neutrophils: 49 %
Platelets: 262 10*3/uL (ref 150–450)
RBC: 4 x10E6/uL (ref 3.77–5.28)
RDW: 12.4 % (ref 11.7–15.4)
WBC: 4.1 10*3/uL (ref 3.4–10.8)

## 2023-02-02 LAB — COMPREHENSIVE METABOLIC PANEL
ALT: 16 [IU]/L (ref 0–32)
AST: 21 [IU]/L (ref 0–40)
Albumin: 4 g/dL (ref 3.7–4.7)
Alkaline Phosphatase: 93 [IU]/L (ref 44–121)
BUN/Creatinine Ratio: 40 — ABNORMAL HIGH (ref 12–28)
BUN: 37 mg/dL — ABNORMAL HIGH (ref 8–27)
Bilirubin Total: 0.3 mg/dL (ref 0.0–1.2)
CO2: 25 mmol/L (ref 20–29)
Calcium: 9.3 mg/dL (ref 8.7–10.3)
Chloride: 103 mmol/L (ref 96–106)
Creatinine, Ser: 0.93 mg/dL (ref 0.57–1.00)
Globulin, Total: 2.7 g/dL (ref 1.5–4.5)
Glucose: 86 mg/dL (ref 70–99)
Potassium: 4.8 mmol/L (ref 3.5–5.2)
Sodium: 140 mmol/L (ref 134–144)
Total Protein: 6.7 g/dL (ref 6.0–8.5)
eGFR: 62 mL/min/{1.73_m2} (ref >59)

## 2023-02-02 LAB — LIPID PANEL
Chol/HDL Ratio: 3.3 ratio (ref 0.0–4.4)
Cholesterol, Total: 154 mg/dL (ref 100–199)
HDL: 46 mg/dL (ref >39)
LDL Chol Calc (NIH): 96 mg/dL (ref 0–99)
Triglycerides: 56 mg/dL (ref 0–149)
VLDL Cholesterol Cal: 12 mg/dL (ref 5–40)

## 2023-02-02 LAB — HEMOGLOBIN A1C
Est. average glucose Bld gHb Est-mCnc: 111 mg/dL
Hgb A1c MFr Bld: 5.5 % (ref 4.8–5.6)

## 2023-02-02 LAB — TSH RFX ON ABNORMAL TO FREE T4: TSH: 0.991 u[IU]/mL (ref 0.450–4.500)

## 2023-02-02 LAB — VITAMIN D 25 HYDROXY (VIT D DEFICIENCY, FRACTURES): Vit D, 25-Hydroxy: 46.3 ng/mL (ref 30.0–100.0)

## 2023-02-24 ENCOUNTER — Other Ambulatory Visit: Payer: Self-pay | Admitting: Cardiovascular Disease

## 2023-02-28 DIAGNOSIS — M5459 Other low back pain: Secondary | ICD-10-CM | POA: Diagnosis not present

## 2023-02-28 DIAGNOSIS — R296 Repeated falls: Secondary | ICD-10-CM | POA: Diagnosis not present

## 2023-02-28 DIAGNOSIS — Z79891 Long term (current) use of opiate analgesic: Secondary | ICD-10-CM | POA: Diagnosis not present

## 2023-02-28 DIAGNOSIS — Z79899 Other long term (current) drug therapy: Secondary | ICD-10-CM | POA: Diagnosis not present

## 2023-02-28 DIAGNOSIS — Z5181 Encounter for therapeutic drug level monitoring: Secondary | ICD-10-CM | POA: Diagnosis not present

## 2023-02-28 DIAGNOSIS — Z96651 Presence of right artificial knee joint: Secondary | ICD-10-CM | POA: Diagnosis not present

## 2023-03-15 ENCOUNTER — Other Ambulatory Visit: Payer: Self-pay

## 2023-03-15 DIAGNOSIS — E785 Hyperlipidemia, unspecified: Secondary | ICD-10-CM

## 2023-03-15 DIAGNOSIS — I10 Essential (primary) hypertension: Secondary | ICD-10-CM

## 2023-03-15 MED ORDER — OMEPRAZOLE 20 MG PO CPDR: 20.0000 mg | DELAYED_RELEASE_CAPSULE | Freq: Every day | ORAL | 1 refills | Status: DC

## 2023-03-21 ENCOUNTER — Other Ambulatory Visit: Payer: Self-pay | Admitting: Nurse Practitioner

## 2023-03-21 DIAGNOSIS — I10 Essential (primary) hypertension: Secondary | ICD-10-CM

## 2023-03-27 ENCOUNTER — Ambulatory Visit: Payer: Self-pay | Admitting: Family Medicine

## 2023-03-27 DIAGNOSIS — J018 Other acute sinusitis: Secondary | ICD-10-CM | POA: Diagnosis not present

## 2023-03-27 NOTE — Telephone Encounter (Signed)
Copied from CRM [id number]. Topic: Clinical - Pink Word Triage >> Mar 27, 2023  8:29 AM Geroge Baseman wrote: Patient/patient representative is calling to schedule an appointment. Refer to attachments for appointment information.   Chief Complaint: cold like symptoms Symptoms: cough, wheezing, runny nose Frequency: started 2 weeks ago and is getting worse Pertinent Negatives: Patient denies fever, pain Disposition: [] ED /[x] Urgent Care (no appt availability in office) / [] Appointment(In office/virtual)/ []  [facility]/ [] Home Care/ [] Refused Recommended Disposition /[] [facility]/ []  Follow-up with PCP Additional Notes: daughter called in for mother, states she has had a cold for 2 weeks and it is not getting better.  Runny nose, congestion, cough with green sputum, sore throat.  No apt. Available at this time, instructed to go to [facility] and to ER if becomes worse.   Reason for Disposition  [1] SEVERE sore throat AND [2] present > 24 hours  Answer Assessment - Initial Assessment Questions 1. ONSET: "When did the nasal discharge start?"      2 weeks, she's had a cold  3. COUGH: "Do you have a cough?" If Yes, ask: "Describe the color of your sputum" (clear, white, yellow, green)     green 4. RESPIRATORY DISTRESS: "Describe your breathing."      wheezes 5. FEVER: "Do you have a fever?" If Yes, ask: "What is your temperature, how was it measured, and when did it start?"     no 6. SEVERITY: "Overall, how bad are you feeling right now?" (e.g., doesn't interfere with normal activities, staying home from school/work, staying in bed)      Yes, but very tired 7. OTHER SYMPTOMS: "Do you have any other symptoms?" (e.g., sore throat, earache, wheezing, vomiting)     denies  Protocols used: Common Cold-A-AH

## 2023-04-12 ENCOUNTER — Other Ambulatory Visit (INDEPENDENT_AMBULATORY_CARE_PROVIDER_SITE_OTHER): Payer: Medicare Other

## 2023-04-12 DIAGNOSIS — R300 Dysuria: Secondary | ICD-10-CM | POA: Diagnosis not present

## 2023-04-12 DIAGNOSIS — I10 Essential (primary) hypertension: Secondary | ICD-10-CM | POA: Diagnosis not present

## 2023-04-12 DIAGNOSIS — R7303 Prediabetes: Secondary | ICD-10-CM | POA: Diagnosis not present

## 2023-04-12 DIAGNOSIS — R82998 Other abnormal findings in urine: Secondary | ICD-10-CM | POA: Diagnosis not present

## 2023-04-12 DIAGNOSIS — E669 Obesity, unspecified: Secondary | ICD-10-CM

## 2023-04-12 DIAGNOSIS — E782 Mixed hyperlipidemia: Secondary | ICD-10-CM

## 2023-04-12 DIAGNOSIS — N3000 Acute cystitis without hematuria: Secondary | ICD-10-CM

## 2023-04-12 DIAGNOSIS — R799 Abnormal finding of blood chemistry, unspecified: Secondary | ICD-10-CM

## 2023-04-12 LAB — POCT URINALYSIS DIP (CLINITEK)
Bilirubin, UA: NEGATIVE
Blood, UA: NEGATIVE
Glucose, UA: NEGATIVE mg/dL
Ketones, POC UA: NEGATIVE mg/dL
Nitrite, UA: NEGATIVE
POC PROTEIN,UA: NEGATIVE
Spec Grav, UA: <=1.005 — AB (ref 1.010–1.025)
Urobilinogen, UA: 0.2 U/dL
pH, UA: 5.5 (ref 5.0–8.0)

## 2023-04-12 NOTE — Addendum Note (Signed)
 Addended by: Tonny Bollman on: 04/12/2023 11:31 AM   Modules accepted: Orders

## 2023-04-12 NOTE — Addendum Note (Signed)
 Addended by: Tonny Bollman on: 04/12/2023 10:08 AM   Modules accepted: Orders

## 2023-04-13 LAB — CBC WITH DIFFERENTIAL/PLATELET
Basophils Absolute: 0 10*3/uL (ref 0.0–0.2)
Basos: 1 %
EOS (ABSOLUTE): 0.3 10*3/uL (ref 0.0–0.4)
Eos: 7 %
Hematocrit: 39.9 % (ref 34.0–46.6)
Hemoglobin: 12.9 g/dL (ref 11.1–15.9)
Immature Grans (Abs): 0 10*3/uL (ref 0.0–0.1)
Immature Granulocytes: 0 %
Lymphocytes Absolute: 1.2 10*3/uL (ref 0.7–3.1)
Lymphs: 32 %
MCH: 30.2 pg (ref 26.6–33.0)
MCHC: 32.3 g/dL (ref 31.5–35.7)
MCV: 93 fL (ref 79–97)
Monocytes Absolute: 0.5 10*3/uL (ref 0.1–0.9)
Monocytes: 14 %
Neutrophils Absolute: 1.8 10*3/uL (ref 1.4–7.0)
Neutrophils: 46 %
Platelets: 331 10*3/uL (ref 150–450)
RBC: 4.27 x10E6/uL (ref 3.77–5.28)
RDW: 13.1 % (ref 11.7–15.4)
WBC: 3.8 10*3/uL (ref 3.4–10.8)

## 2023-04-13 LAB — LIPID PANEL
Chol/HDL Ratio: 3.8 {ratio} (ref 0.0–4.4)
Cholesterol, Total: 171 mg/dL (ref 100–199)
HDL: 45 mg/dL (ref >39)
LDL Chol Calc (NIH): 114 mg/dL — ABNORMAL HIGH (ref 0–99)
Triglycerides: 63 mg/dL (ref 0–149)
VLDL Cholesterol Cal: 12 mg/dL (ref 5–40)

## 2023-04-13 LAB — COMPREHENSIVE METABOLIC PANEL
ALT: 14 [IU]/L (ref 0–32)
AST: 19 [IU]/L (ref 0–40)
Albumin: 4.1 g/dL (ref 3.7–4.7)
Alkaline Phosphatase: 88 [IU]/L (ref 44–121)
BUN/Creatinine Ratio: 36 — ABNORMAL HIGH (ref 12–28)
BUN: 41 mg/dL — ABNORMAL HIGH (ref 8–27)
Bilirubin Total: 0.3 mg/dL (ref 0.0–1.2)
CO2: 24 mmol/L (ref 20–29)
Calcium: 9.2 mg/dL (ref 8.7–10.3)
Chloride: 103 mmol/L (ref 96–106)
Creatinine, Ser: 1.14 mg/dL — ABNORMAL HIGH (ref 0.57–1.00)
Globulin, Total: 3 g/dL (ref 1.5–4.5)
Glucose: 82 mg/dL (ref 70–99)
Potassium: 4.8 mmol/L (ref 3.5–5.2)
Sodium: 141 mmol/L (ref 134–144)
Total Protein: 7.1 g/dL (ref 6.0–8.5)
eGFR: 48 mL/min/{1.73_m2} — ABNORMAL LOW (ref >59)

## 2023-04-13 LAB — HEMOGLOBIN A1C
Est. average glucose Bld gHb Est-mCnc: 126 mg/dL
Hgb A1c MFr Bld: 6 % — ABNORMAL HIGH (ref 4.8–5.6)

## 2023-04-16 ENCOUNTER — Telehealth: Payer: Self-pay

## 2023-04-16 NOTE — Telephone Encounter (Signed)
-----   Message from Joesph DELENA Sear sent at 04/13/2023 10:32 AM EST ----- Routing to [place] as [name].  This will be a nonfasting lab appointment.  Hi Tracey Walton, LDL bad cholesterol has increased from the last time that it was checked.  We will continue to monitor this number in addition to your A1c which has also increased.  Both of these numbers could be related to the holidays.  Your labs also indicate some kidney dysfunction.  With the urinary symptoms that you have been experiencing, it is possibly related to this.  I will let you know when we get the results of the urine culture, but in the meantime please make sure that you are drinking a lots of water.  If you experience significant abdominal pain, side pain, back pain, fever, chills, nausea or vomiting, please go to the emergency room.  I would like to recheck your kidney function in about 1 week.  Please call to schedule that lab appointment.

## 2023-04-16 NOTE — Telephone Encounter (Signed)
 Pt is coming on 04/20/23 for lab recheck

## 2023-04-18 LAB — URINE CULTURE

## 2023-04-18 MED ORDER — CEPHALEXIN 500 MG PO CAPS: 500.0000 mg | ORAL_CAPSULE | Freq: Two times a day (BID) | ORAL | 0 refills | Status: DC

## 2023-04-18 NOTE — Addendum Note (Signed)
 Addended by: WALLACE SEARCH on: 04/18/2023 02:11 PM   Modules accepted: Orders

## 2023-04-19 ENCOUNTER — Encounter: Payer: Self-pay | Admitting: Podiatry

## 2023-04-19 ENCOUNTER — Ambulatory Visit: Payer: Medicare Other | Admitting: Podiatry

## 2023-04-19 VITALS — Ht 63.0 in | Wt 198.0 lb

## 2023-04-19 DIAGNOSIS — M79675 Pain in left toe(s): Secondary | ICD-10-CM

## 2023-04-19 DIAGNOSIS — B351 Tinea unguium: Secondary | ICD-10-CM

## 2023-04-19 DIAGNOSIS — L84 Corns and callosities: Secondary | ICD-10-CM | POA: Diagnosis not present

## 2023-04-19 DIAGNOSIS — M79674 Pain in right toe(s): Secondary | ICD-10-CM

## 2023-04-19 DIAGNOSIS — M216X1 Other acquired deformities of right foot: Secondary | ICD-10-CM

## 2023-04-19 DIAGNOSIS — M216X2 Other acquired deformities of left foot: Secondary | ICD-10-CM | POA: Diagnosis not present

## 2023-04-19 NOTE — Progress Notes (Signed)
This patient presents to the office with chief complaint of long thick painful nails.  Patient says the nails are painful walking and wearing shoes.  This patient is unable to self treat.  This patient is unable to trim her nails since she is unable to reach her nails.  She also has painful callus under the outside ball of her left foot.  She says the pain is severe and presents for evaluation and treatment. She presents to the office for preventative foot care services.  General Appearance  Alert, conversant and in no acute stress.  Vascular  Dorsalis pedis and posterior tibial  pulses are palpable  bilaterally.  Capillary return is within normal limits  bilaterally. Temperature is within normal limits  bilaterally.  Neurologic  Senn-Weinstein monofilament wire test within normal limits  bilaterally. Muscle power within normal limits bilaterally.  Nails Thick disfigured discolored nails with subungual debris  from hallux to fifth toes bilaterally. No evidence of bacterial infection or drainage bilaterally.  Orthopedic  No limitations of motion  feet .  No crepitus or effusions noted.  HAV  B/L.  Hammer toe second toe right foot. Plantar flex fifth met  B/L.  Skin  normotropic skin with no porokeratosis noted bilaterally.  No signs of infections or ulcers noted.  Callus sub 5th met left foot.   Onychomycosis  Nails  B/L.  Pain in right toes  Pain in left toes Callus secondary fifth met left foot.  IE.  Debridement of nails both feet followed trimming the nails with dremel tool.   Debride callus with # 15 blade and dremel tool.  RTC 3 months.   Helane Gunther DPM

## 2023-04-19 NOTE — Telephone Encounter (Signed)
Copied from CRM [id number]. Topic: Clinical - Medical Advice >> Apr 19, 2023  1:18 PM Donita Brooks wrote: Reason for CRM: pt call not to long ago regarding if her provider still wants her to have lab work done tomorrow while she has a kidney infection. She provided incorrect call back number. This is the correct call back number - [phone]

## 2023-04-19 NOTE — Telephone Encounter (Signed)
We can hold off on the lab work while she has this kidney infection as it will likely be abnormal again.  Please reschedule the lab appointment for 3-4 weeks from today instead to make sure that kidney function goes back to normal after the infection is over

## 2023-04-19 NOTE — Telephone Encounter (Signed)
Copied from CRM [id number]. Topic: Clinical - Medical Advice >> Apr 19, 2023  1:11 PM Elle L wrote: Reason for CRM: The patient wants to know if her provider still wants her to have lab work done tomorrow while she has a kidney infection. Her call back number is [phone].

## 2023-04-20 ENCOUNTER — Other Ambulatory Visit: Payer: Medicare Other

## 2023-05-03 ENCOUNTER — Other Ambulatory Visit: Payer: Self-pay

## 2023-05-03 ENCOUNTER — Ambulatory Visit: Payer: Medicare Other

## 2023-05-03 DIAGNOSIS — Z0000 Encounter for general adult medical examination without abnormal findings: Secondary | ICD-10-CM

## 2023-05-03 DIAGNOSIS — N289 Disorder of kidney and ureter, unspecified: Secondary | ICD-10-CM

## 2023-05-03 NOTE — Progress Notes (Signed)
Subjective:   Tracey Walton is a 83 y.o. female who presents for Medicare Annual (Subsequent) preventive examination.  Visit Complete: Virtual I connected with  Herbert Seta on 05/03/23 by a audio enabled telemedicine application and verified that I am speaking with the correct person using two identifiers.  Interactive audio and video telecommunications were attempted between this provider and patient, however failed, due to patient having technical difficulties OR patient did not have access to video capability.  We continued and completed visit with audio only.  Patient Location: Home  Provider Location: Home Office  I discussed the limitations of evaluation and management by telemedicine. The patient expressed understanding and agreed to proceed.  Vital Signs: Because this visit was a virtual/telehealth visit, some criteria may be missing or patient reported. Any vitals not documented were not able to be obtained and vitals that have been documented are patient reported.    Cardiac Risk Factors include: advanced age (>39men, >31 women);dyslipidemia;hypertension     Objective:    Today's Vitals   05/03/23 1418  PainSc: 4    There is no height or weight on file to calculate BMI.     05/03/2023    2:26 PM 01/20/2021    2:33 AM 12/27/2020   11:57 AM 11/07/2017   11:11 AM 08/09/2017    4:08 PM 08/09/2017    3:00 PM 07/27/2017    3:06 PM  Advanced Directives  Does Patient Have a Medical Advance Directive? Yes No No No  Yes Yes  Type of Estate agent of [place];Living will    Healthcare Power of [place];Living will Healthcare Power of [place];Living will Healthcare Power of [place];Living will  Does patient want to make changes to medical advance directive?      No - Patient declined   Copy of Healthcare Power of Attorney in Chart? No - copy requested    No - copy requested  No - copy requested  Would patient like information on creating a medical  advance directive?   No - Patient declined No - Patient declined       Current Medications (verified) Outpatient Encounter Medications as of 05/03/2023  Medication Sig   acetaminophen (TYLENOL) 325 MG tablet Take 650 mg by mouth 4 (four) times daily as needed (for pain.).   amLODipine (NORVASC) 5 MG tablet TAKE 1 TABLET BY MOUTH DAILY   Baclofen 5 MG TABS Take 1 tablet by mouth 2 (two) times daily as needed.   carvedilol (COREG) 6.25 MG tablet TAKE 1 TABLET BY MOUTH TWICE  DAILY   cetirizine (ZYRTEC) 10 MG tablet Take 10 mg by mouth daily.   ferrous sulfate 325 (65 FE) MG tablet Take 1 tablet (325 mg total) by mouth daily with breakfast. Take on Monday, Wednesday, Fridays   FLUoxetine (PROZAC) 20 MG capsule Take 1 capsule (20 mg total) by mouth daily.   fluticasone (FLONASE) 50 MCG/ACT nasal spray Place 2 sprays into both nostrils daily.   methocarbamol (ROBAXIN) 500 MG tablet methocarbamol 500 mg tablet  Take 1 tablet twice a day by oral route as needed.   naproxen (NAPROSYN) 500 MG tablet TAKE 1 TABLET BY MOUTH TWICE  DAILY WITH MEALS   omeprazole (PRILOSEC) 20 MG capsule Take 1 capsule (20 mg total) by mouth daily.   ondansetron (ZOFRAN) 4 MG tablet Take 1 tablet (4 mg total) by mouth every 8 (eight) hours as needed for nausea or vomiting.   Polyethyl Glycol-Propyl Glycol 0.4-0.3 % SOLN Place 1  drop into both eyes 3 (three) times daily as needed (for dry/irritated eyes.).   rosuvastatin (CRESTOR) 20 MG tablet TAKE 1 TABLET BY MOUTH DAILY   triamcinolone cream (KENALOG) 0.1 % Apply 1 Application topically 2 (two) times daily.   trimethoprim (TRIMPEX) 100 MG tablet Take 100 mg by mouth daily. At bed time   cephALEXin (KEFLEX) 500 MG capsule Take 1 capsule (500 mg total) by mouth 2 (two) times daily. (Patient not taking: Reported on 05/03/2023)   coal tar-salicylic acid 2 % shampoo Apply topically daily as needed for itching. (Patient not taking: Reported on 05/03/2023)   gabapentin (NEURONTIN)  100 MG capsule gabapentin 100 mg capsule  Take 1 capsule twice a day by oral route.  May cause drowsiness (Patient not taking: Reported on 05/03/2023)   No facility-administered encounter medications on file as of 05/03/2023.    Allergies (verified) Penicillins   History: Past Medical History:  Diagnosis Date   Anxiety    Arthritis    "qwhere" (08/09/2017)   Chest pain 2011   negative for MI   Chronic kidney disease    renal insufficiency. 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 down after her knee surgery back in 2008   Chronic lower back pain    Depression    Dyspnea    SOB while walking   GERD (gastroesophageal reflux disease)    Heart murmur    History of hiatal hernia    Hyperlipidemia    Hypertension    Osteoporosis    Pneumonia ~ 2014   Polymyalgia (HCC)    Strep throat    Vertigo    Past Surgical History:  Procedure Laterality Date   ABDOMINAL HYSTERECTOMY     partial   BILATERAL CARPAL TUNNEL RELEASE  2003-2005   right-left   CATARACT EXTRACTION W/ INTRAOCULAR LENS  IMPLANT, BILATERAL Bilateral    JOINT REPLACEMENT     REVERSE SHOULDER ARTHROPLASTY Left 10/12/2016   Procedure: REVERSE LEFT SHOULDER ARTHROPLASTY;  Surgeon: Francena Hanly, MD;  Location: [facility] OR;  Service: Orthopedics;  Laterality: Left;   REVERSE SHOULDER ARTHROPLASTY Right 08/09/2017   Procedure: RIGHT REVERSE SHOULDER ARTHROPLASTY;  Surgeon: Francena Hanly, MD;  Location: [facility] OR;  Service: Orthopedics;  Laterality: Right;   SHOULDER ARTHROSCOPY Right 2003   TOOTH EXTRACTION     "1 tooth" (08/09/2017)   TOTAL KNEE ARTHROPLASTY Bilateral    Family History  Problem Relation Age of Onset   Heart attack Mother    Heart disease Mother    Hypertension Father    Heart failure Father    Kidney failure Father    Hyperlipidemia Father    Stroke Father    Hypertension Sister    Hyperlipidemia Sister    Social History   Socioeconomic History   Marital status: Widowed    Spouse name: Not on file   Number of  children: 3   Years of education: Not on file   Highest education level: 10th grade  Occupational History   Not on file  Tobacco Use   Smoking status: Never    Passive exposure: Never   Smokeless tobacco: Never  Vaping Use   Vaping status: Never Used  Substance and Sexual Activity   Alcohol use: Never   Drug use: Never   Sexual activity: Not Currently    Birth control/protection: None  Other Topics Concern   Not on file  Social History Narrative   Lives with daughter   caffeine not much at all   Social Drivers of  Health   Financial Resource Strain: Low Risk  (05/03/2023)   Overall Financial Resource Strain (CARDIA)    Difficulty of Paying Living Expenses: Not hard at all  Food Insecurity: No Food Insecurity (05/03/2023)   Hunger Vital Sign    Worried About Running Out of Food in the Last Year: Never true    Ran Out of Food in the Last Year: Never true  Transportation Needs: No Transportation Needs (05/03/2023)   PRAPARE - Administrator, Civil Service (Medical): No    Lack of Transportation (Non-Medical): No  Physical Activity: Inactive (05/03/2023)   Exercise Vital Sign    Days of Exercise per Week: 0 days    Minutes of Exercise per Session: 0 min  Stress: No Stress Concern Present (05/03/2023)   [employer] of Occupational Health - Occupational Stress Questionnaire    Feeling of Stress : Not at all  Social Connections: Socially Isolated (05/03/2023)   Social Connection and Isolation Panel [NHANES]    Frequency of Communication with Friends and Family: More than three times a week    Frequency of Social Gatherings with Friends and Family: More than three times a week    Attends Religious Services: Never    Database administrator or Organizations: No    Attends Banker Meetings: Never    Marital Status: Widowed    Tobacco Counseling Counseling given: Not Answered   Clinical Intake:  Pre-visit preparation completed: Yes  Pain :  0-10 Pain Score: 4  Pain Type: Chronic pain Pain Location: Back Pain Orientation: Lower Pain Descriptors / Indicators: Aching Pain Onset: More than a month ago Pain Frequency: Constant     Nutritional Risks: None Diabetes: No  How often do you need to have someone help you when you read instructions, pamphlets, or other written materials from your doctor or pharmacy?: 1 - Never  Interpreter Needed?: No  Information entered by :: NAllen LPN   Activities of Daily Living    05/03/2023    2:19 PM  In your present state of health, do you have any difficulty performing the following activities:  Hearing? 0  Vision? 0  Difficulty concentrating or making decisions? 0  Walking or climbing stairs? 1  Dressing or bathing? 0  Doing errands, shopping? 0  Preparing Food and eating ? N  Using the Toilet? N  In the past six months, have you accidently leaked urine? Y  Comment due to UTI  Do you have problems with loss of bowel control? N  Managing your Medications? N  Managing your Finances? N  Housekeeping or managing your Housekeeping? N    Patient Care Team: Melida Quitter, PA as PCP - General (Family Medicine) Nahser, Deloris Ping, MD as PCP - Cardiology (Cardiology)  Indicate any recent Medical Services you may have received from other than [facility] providers in the past year (date may be approximate).     Assessment:   This is a routine wellness examination for Mikhia.  Hearing/Vision screen Hearing Screening - Comments:: Denies hearing issues Vision Screening - Comments:: Regular eye exams, [facility]   Goals Addressed             This Visit's Progress    Patient Stated       05/03/2023, denies goals       Depression Screen    05/03/2023    2:28 PM 01/26/2023   11:34 AM 04/27/2022    2:26 PM 12/27/2021    1:22 PM 12/27/2020  11:59 AM 12/27/2020   11:54 AM 11/10/2020    9:36 AM  PHQ 2/9 Scores  PHQ - 2 Score 0 1 0 1 0 0 0  PHQ- 9 Score  8 0 6 0 0 0    Fall  Risk    05/03/2023    2:27 PM 04/27/2022    2:29 PM 12/27/2021    1:21 PM 12/27/2020   11:58 AM 11/10/2020    9:37 AM  Fall Risk   Falls in the past year? 1 0 1 1 0  Comment leg gives out      Number falls in past yr: 1 1 1 1 1   Injury with Fall? 0 0 1 0 0  Risk for fall due to : Impaired balance/gait;Impaired mobility;History of fall(s);Medication side effect  History of fall(s);Impaired balance/gait    Follow up Falls prevention discussed;Falls evaluation completed Falls evaluation completed Falls evaluation completed Falls evaluation completed Falls evaluation completed    MEDICARE RISK AT HOME: Medicare Risk at Home Any stairs in or around the home?: No If so, are there any without handrails?: No Home free of loose throw rugs in walkways, pet beds, electrical cords, etc?: Yes Adequate lighting in your home to reduce risk of falls?: Yes Life alert?: No Use of a cane, walker or w/c?: Yes Grab bars in the bathroom?: Yes Shower chair or bench in shower?: No Elevated toilet seat or a handicapped toilet?: Yes  TIMED UP AND GO:  Was the test performed?  No    Cognitive Function:        05/03/2023    2:29 PM 04/27/2022    2:24 PM 12/27/2020   12:02 PM 12/03/2019    1:56 PM  6CIT Screen  What Year? 0 points 0 points 0 points 0 points  What month? 0 points 0 points 0 points 0 points  What time? 0 points 0 points 0 points 0 points  Count back from 20 0 points 0 points 2 points 2 points  Months in reverse 0 points 0 points 0 points 0 points  Repeat phrase 0 points 0 points 0 points 6 points  Total Score 0 points 0 points 2 points 8 points    Immunizations Immunization History  Administered Date(s) Administered   Influenza, High Dose Seasonal PF 02/21/2018, 12/21/2018, 01/22/2022   Influenza,inj,quad, With Preservative 02/11/2017   Influenza-Unspecified 01/25/2020   Moderna Sars-Covid-2 Vaccination 07/22/2019, 08/27/2019, 04/06/2020   Pneumococcal Conjugate-13 03/19/2016    Pneumococcal Polysaccharide-23 11/07/2017, 11/14/2017   Unspecified SARS-COV-2 Vaccination 01/30/2023   Zoster Recombinant(Shingrix) 04/16/2022, 07/29/2022   Zoster, Live 12/16/2011    TDAP status: Up to date  Flu Vaccine status: Due, Education has been provided regarding the importance of this vaccine. Advised may receive this vaccine at local pharmacy or Health Dept. Aware to provide a copy of the vaccination record if obtained from local pharmacy or Health Dept. Verbalized acceptance and understanding.  Pneumococcal vaccine status: Up to date  Covid-19 vaccine status: Completed vaccines  Qualifies for Shingles Vaccine? Yes   Zostavax completed Yes   Shingrix Completed?: Yes  Screening Tests Health Maintenance  Topic Date Due   DTaP/Tdap/Td (1 - Tdap) Never done   COVID-19 Vaccine (5 - 2024-25 season) 03/27/2023   DEXA SCAN  05/28/2023 (Originally 03/29/2006)   INFLUENZA VACCINE  07/02/2023 (Originally 11/02/2022)   Medicare Annual Wellness (AWV)  05/02/2024   Pneumonia Vaccine 59+ Years old  Completed   Zoster Vaccines- Shingrix  Completed   HPV VACCINES  Aged  Out   Hepatitis C Screening  Discontinued    Health Maintenance  Health Maintenance Due  Topic Date Due   DTaP/Tdap/Td (1 - Tdap) Never done   COVID-19 Vaccine (5 - 2024-25 season) 03/27/2023    Colorectal cancer screening: No longer required.   Mammogram status: No longer required due to age.  Bone Density status: declines  Lung Cancer Screening: (Low Dose CT Chest recommended if Age 59-80 years, 20 pack-year currently smoking OR have quit w/in 15years.) does not qualify.   Lung Cancer Screening Referral: no  Additional Screening:  Hepatitis C Screening: does not qualify;  Vision Screening: Recommended annual ophthalmology exams for early detection of glaucoma and other disorders of the eye. Is the patient up to date with their annual eye exam?  Yes  Who is the provider or what is the name of the office  in which the patient attends annual eye exams? [facility] If pt is not established with a provider, would they like to be referred to a provider to establish care? No .   Dental Screening: Recommended annual dental exams for proper oral hygiene  Diabetic Foot Exam: n/a  Community Resource Referral / Chronic Care Management: CRR required this visit?  No   CCM required this visit?  No     Plan:     I have personally reviewed and noted the following in the patient's chart:   Medical and social history Use of alcohol, tobacco or illicit drugs  Current medications and supplements including opioid prescriptions. Patient is not currently taking opioid prescriptions. Functional ability and status Nutritional status Physical activity Advanced directives List of other physicians Hospitalizations, surgeries, and ER visits in previous 12 months Vitals Screenings to include cognitive, depression, and falls Referrals and appointments  In addition, I have reviewed and discussed with patient certain preventive protocols, quality metrics, and best practice recommendations. A written personalized care plan for preventive services as well as general preventive health recommendations were provided to patient.     Barb Merino, LPN   1/[id number]   After Visit Summary: (Pick Up) Due to this being a telephonic visit, with patients personalized plan was offered to patient and patient has requested to Pick up at office.  Nurse Notes: none

## 2023-05-03 NOTE — Patient Instructions (Signed)
Tracey Walton , Thank you for taking time to come for your Medicare Wellness Visit. I appreciate your ongoing commitment to your health goals. Please review the following plan we discussed and let me know if I can assist you in the future.   Referrals/Orders/Follow-Ups/Clinician Recommendations: none  This is a list of the screening recommended for you and due dates:  Health Maintenance  Topic Date Due   DTaP/Tdap/Td vaccine (1 - Tdap) Never done   COVID-19 Vaccine (5 - 2024-25 season) 03/27/2023   DEXA scan (bone density measurement)  05/28/2023*   Flu Shot  07/02/2023*   Medicare Annual Wellness Visit  05/02/2024   Pneumonia Vaccine  Completed   Zoster (Shingles) Vaccine  Completed   HPV Vaccine  Aged Out   Hepatitis C Screening  Discontinued  *Topic was postponed. The date shown is not the original due date.    Advanced directives: (Copy Requested) Please bring a copy of your health care power of attorney and living will to the office to be added to your chart at your convenience.  Next Medicare Annual Wellness Visit scheduled for next year: Yes  insert Preventive Care attachment Insert FALL PREVENTION attachment if needed

## 2023-05-10 ENCOUNTER — Encounter: Payer: Self-pay | Admitting: Family Medicine

## 2023-05-17 ENCOUNTER — Other Ambulatory Visit: Payer: Medicare Other

## 2023-05-17 DIAGNOSIS — N289 Disorder of kidney and ureter, unspecified: Secondary | ICD-10-CM | POA: Diagnosis not present

## 2023-05-18 LAB — RENAL FUNCTION PANEL
Albumin: 4 g/dL (ref 3.7–4.7)
BUN/Creatinine Ratio: 30 — ABNORMAL HIGH (ref 12–28)
BUN: 33 mg/dL — ABNORMAL HIGH (ref 8–27)
CO2: 24 mmol/L (ref 20–29)
Calcium: 9.4 mg/dL (ref 8.7–10.3)
Chloride: 102 mmol/L (ref 96–106)
Creatinine, Ser: 1.11 mg/dL — ABNORMAL HIGH (ref 0.57–1.00)
Glucose: 84 mg/dL (ref 70–99)
Phosphorus: 4.1 mg/dL (ref 3.0–4.3)
Potassium: 5 mmol/L (ref 3.5–5.2)
Sodium: 140 mmol/L (ref 134–144)
eGFR: 50 mL/min/{1.73_m2} — ABNORMAL LOW (ref >59)

## 2023-05-20 ENCOUNTER — Other Ambulatory Visit: Payer: Self-pay | Admitting: Family Medicine

## 2023-05-20 DIAGNOSIS — I10 Essential (primary) hypertension: Secondary | ICD-10-CM

## 2023-05-20 DIAGNOSIS — E785 Hyperlipidemia, unspecified: Secondary | ICD-10-CM

## 2023-05-22 ENCOUNTER — Encounter: Payer: Self-pay | Admitting: Family Medicine

## 2023-05-25 DIAGNOSIS — N3021 Other chronic cystitis with hematuria: Secondary | ICD-10-CM | POA: Diagnosis not present

## 2023-05-25 DIAGNOSIS — R3915 Urgency of urination: Secondary | ICD-10-CM | POA: Diagnosis not present

## 2023-05-29 ENCOUNTER — Other Ambulatory Visit: Payer: Self-pay | Admitting: Nurse Practitioner

## 2023-05-29 DIAGNOSIS — I10 Essential (primary) hypertension: Secondary | ICD-10-CM

## 2023-06-04 DIAGNOSIS — M48062 Spinal stenosis, lumbar region with neurogenic claudication: Secondary | ICD-10-CM | POA: Diagnosis not present

## 2023-06-06 ENCOUNTER — Ambulatory Visit: Admitting: Internal Medicine

## 2023-06-06 ENCOUNTER — Encounter: Payer: Self-pay | Admitting: Internal Medicine

## 2023-06-06 ENCOUNTER — Ambulatory Visit: Payer: Self-pay | Admitting: Family Medicine

## 2023-06-06 VITALS — BP 120/78 | HR 71 | Temp 97.7°F | Ht 63.0 in | Wt 199.0 lb

## 2023-06-06 DIAGNOSIS — H6591 Unspecified nonsuppurative otitis media, right ear: Secondary | ICD-10-CM

## 2023-06-06 MED ORDER — CEFDINIR 300 MG PO CAPS: 300.0000 mg | ORAL_CAPSULE | Freq: Two times a day (BID) | ORAL | 0 refills | Status: AC

## 2023-06-06 NOTE — Telephone Encounter (Signed)
  Chief Complaint: R ear pain Symptoms: pain, nausea, dizziness, decreased hearing  Frequency: 1 week Pertinent Negatives: Patient denies CP, SOB, fever, weakness Disposition: [] ED /[] Urgent Care (no appt availability in office) / [x] Appointment(In office/virtual)/ []  [facility]/ [] Home Care/ [] Refused Recommended Disposition /[]  Mobile Bus/ []  Follow-up with PCP Additional Notes: Patient calls reporting R ear pain x 1 week. States she has had vertigo in the past and this has some similarities, reports she does not take any medication for vertigo. Per protocol, patient to be evaluated within 4 hours. First available appointment with PCP or other provider in clinic outside of guidelines. Patient scheduled with first available provider in alt clinic for today at 1500 at patient request- patient reports bad weather and asks for latest appt today. Care advice reviewed, patient verbalized understanding and denies further questions at this time. Alerting PCP for review.    Copied from CRM [id number]. Topic: Clinical - Red Word Triage >> Jun 06, 2023  8:40 AM Carlatta H wrote: [facility] that prompted transfer to Nurse Triage: Right ear//symptoms for about 1 week//Patient stated she is extremely dizzy Reason for Disposition  Walking is very unsteady or feels very dizzy  Answer Assessment - Initial Assessment Questions 1. LOCATION: "Which ear is involved?"     R ear 2. ONSET: "When did the ear start hurting"      1 week 3. SEVERITY: "How bad is the pain?"  (Scale 1-10; mild, moderate or severe)   - MILD (1-3): doesn't interfere with normal activities    - MODERATE (4-7): interferes with normal activities or awakens from sleep    - SEVERE (8-10): excruciating pain, unable to do any normal activities      Uncomfortable 4. URI SYMPTOMS: "Do you have a runny nose or cough?"     Denies 5. FEVER: "Do you have a fever?" If Yes, ask: "What is your temperature, how was it measured, and  when did it start?"     Denies 6. CAUSE: "Have you been swimming recently?", "How often do you use Q-TIPS?", "Have you had any recent air travel or scuba diving?"     Denies 7. OTHER SYMPTOMS: "Do you have any other symptoms?" (e.g., headache, stiff neck, dizziness, vomiting, runny nose, decreased hearing)     Dizziness, nausea, head feels full, decreased hearing in R ear  Protocols used: Earache-A-AH

## 2023-06-06 NOTE — Patient Instructions (Signed)
 Use Claritin 10mg   once daily Flonase nasal spray 1-2 times a day

## 2023-06-06 NOTE — Progress Notes (Signed)
 [facility] [address] [place] [address] Dept: [phone] Dept Fax: [phone]  Acute Care Office Visit  Subjective:   Tracey Walton 1940-05-02 06/06/2023  Chief Complaint  Patient presents with   Ear Pain    Started last week    HPI: Discussed the use of AI scribe software for clinical note transcription with the patient, who gave verbal consent to proceed.  History of Present Illness   The patient, with a history of fluid behind the ear, presents with worsening bilateral ear pressure, more pronounced on the right, and dizziness. The symptoms started last week and have progressively worsened. She denies fever, sinus congestion, cough, ear drainage, and sore throat. She has tried Dramamine for symptom relief, which provides temporary relief before the symptoms return. She continues to use Flonase nasal spray daily.       The following portions of the patient's history were reviewed and updated as appropriate: past medical history, past surgical history, family history, social history, allergies, medications, and problem list.   Patient Active Problem List   Diagnosis Date Noted   Callus 04/19/2023   Plantar flexed metatarsal bone of left foot 04/19/2023   Plantar flexed metatarsal bone of right foot 04/19/2023   Allergic rhinitis 12/27/2021   Bilateral lower extremity edema 10/31/2020   Recurrent UTI (urinary tract infection) 06/07/2020   Falls frequently 06/07/2020   Vertigo 05/12/2019   Fluid level behind tympanic membrane of both ears 05/12/2019   Porokeratosis 03/26/2019   Depression 11/07/2017   Obesity with body mass index of 30.0-39.9 11/07/2017   Long-term current use of opiate analgesic 07/25/2017   S/p reverse total shoulder arthroplasty 10/12/2016   Mild aortic valve stenosis 07/06/2014   Hyperlipidemia 05/29/2014   Essential hypertension 05/29/2014   Polymyalgia (HCC) 01/04/2013   Chronic  low back pain 09/30/2012   Past Medical History:  Diagnosis Date   Anxiety    Arthritis    "qwhere" (08/09/2017)   Chest pain 2011   negative for MI   Chronic kidney disease    renal insufficiency. 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 down after her knee surgery back in 2008   Chronic lower back pain    Depression    Dyspnea    SOB while walking   GERD (gastroesophageal reflux disease)    Heart murmur    History of hiatal hernia    Hyperlipidemia    Hypertension    Osteoporosis    Pneumonia ~ 2014   Polymyalgia (HCC)    Strep throat    Vertigo    Past Surgical History:  Procedure Laterality Date   ABDOMINAL HYSTERECTOMY     partial   BILATERAL CARPAL TUNNEL RELEASE  2003-2005   right-left   CATARACT EXTRACTION W/ INTRAOCULAR LENS  IMPLANT, BILATERAL Bilateral    JOINT REPLACEMENT     REVERSE SHOULDER ARTHROPLASTY Left 10/12/2016   Procedure: REVERSE LEFT SHOULDER ARTHROPLASTY;  Surgeon: Francena Hanly, MD;  Location: [facility] OR;  Service: Orthopedics;  Laterality: Left;   REVERSE SHOULDER ARTHROPLASTY Right 08/09/2017   Procedure: RIGHT REVERSE SHOULDER ARTHROPLASTY;  Surgeon: Francena Hanly, MD;  Location: [facility] OR;  Service: Orthopedics;  Laterality: Right;   SHOULDER ARTHROSCOPY Right 2003   TOOTH EXTRACTION     "1 tooth" (08/09/2017)   TOTAL KNEE ARTHROPLASTY Bilateral    Family History  Problem Relation Age of Onset   Heart attack Mother    Heart disease Mother    Hypertension Father  Heart failure Father    Kidney failure Father    Hyperlipidemia Father    Stroke Father    Hypertension Sister    Hyperlipidemia Sister     Current Outpatient Medications:    acetaminophen (TYLENOL) 325 MG tablet, Take 650 mg by mouth 4 (four) times daily as needed (for pain.)., Disp: , Rfl:    amLODipine (NORVASC) 5 MG tablet, TAKE 1 TABLET BY MOUTH DAILY, Disp: 90 tablet, Rfl: 3   Baclofen 5 MG TABS, Take 1 tablet by mouth 2 (two) times daily as needed., Disp: , Rfl:    carvedilol (COREG) 6.25 MG  tablet, TAKE 1 TABLET BY MOUTH TWICE  DAILY, Disp: 180 tablet, Rfl: 0   cefdinir (OMNICEF) 300 MG capsule, Take 1 capsule (300 mg total) by mouth 2 (two) times daily for 7 days., Disp: 14 capsule, Rfl: 0   cetirizine (ZYRTEC) 10 MG tablet, Take 10 mg by mouth daily., Disp: , Rfl:    ferrous sulfate 325 (65 FE) MG tablet, Take 1 tablet (325 mg total) by mouth daily with breakfast. Take on Monday, Wednesday, Fridays, Disp: 90 tablet, Rfl: 1   FLUoxetine (PROZAC) 20 MG capsule, Take 1 capsule (20 mg total) by mouth daily., Disp: 90 capsule, Rfl: 3   fluticasone (FLONASE) 50 MCG/ACT nasal spray, Place 2 sprays into both nostrils daily., Disp: 48 g, Rfl: 2   naproxen (NAPROSYN) 500 MG tablet, TAKE 1 TABLET BY MOUTH TWICE  DAILY WITH MEALS, Disp: 180 tablet, Rfl: 3   omeprazole (PRILOSEC) 20 MG capsule, TAKE 1 CAPSULE BY MOUTH DAILY, Disp: 100 capsule, Rfl: 2   ondansetron (ZOFRAN) 4 MG tablet, Take 1 tablet (4 mg total) by mouth every 8 (eight) hours as needed for nausea or vomiting., Disp: 20 tablet, Rfl: 0   Polyethyl Glycol-Propyl Glycol 0.4-0.3 % SOLN, Place 1 drop into both eyes 3 (three) times daily as needed (for dry/irritated eyes.)., Disp: , Rfl:    rosuvastatin (CRESTOR) 20 MG tablet, TAKE 1 TABLET BY MOUTH DAILY, Disp: 100 tablet, Rfl: 1   triamcinolone cream (KENALOG) 0.1 %, Apply 1 Application topically 2 (two) times daily., Disp: 80 g, Rfl: 3   trimethoprim (TRIMPEX) 100 MG tablet, Take 100 mg by mouth daily. At bed time, Disp: , Rfl:    coal tar-salicylic acid 2 % shampoo, Apply topically daily as needed for itching. (Patient not taking: Reported on 05/03/2023), Disp: 236 mL, Rfl: 3   gabapentin (NEURONTIN) 100 MG capsule, gabapentin 100 mg capsule  Take 1 capsule twice a day by oral route.  May cause drowsiness (Patient not taking: Reported on 05/03/2023), Disp: , Rfl:    methocarbamol (ROBAXIN) 500 MG tablet, methocarbamol 500 mg tablet  Take 1 tablet twice a day by oral route as needed.  (Patient not taking: Reported on 06/06/2023), Disp: , Rfl:  Allergies  Allergen Reactions   Penicillins Rash    Pt tolerated 2g ancef without issue 10/12/16     ROS: A complete ROS was performed with pertinent positives/negatives noted in the HPI. The remainder of the ROS are negative.    Objective:   Today's Vitals   06/06/23 1458  BP: 120/78  Pulse: 71  Temp: 97.7 F (36.5 C)  TempSrc: Temporal  SpO2: 96%  Weight: 199 lb (90.3 kg)  Height: 5\' 3"  (1.6 m)    GENERAL: Well-appearing, in NAD. Well nourished.  SKIN: Pink, warm and dry. No rash, lesion, ulceration, or ecchymoses.  HEENT:    HEAD: Normocephalic, non-traumatic.  EYES: Conjunctive pink without exudate. PERRL, EOMI.  EARS: External ear w/o redness, swelling, masses, or lesions. EAC clear. TM's intact, right TM injected with bulging, appropriate landmarks visualized.  NOSE: Septum midline w/o deformity. Nares patent, mucosa pink and non-inflamed w/o drainage. No sinus tenderness.  THROAT: Uvula midline. Oropharynx clear. Tonsils non-inflamed w/o exudate. Mucus membranes pink and moist.  NECK: Trachea midline. Full ROM w/o pain or tenderness. No lymphadenopathy.  RESPIRATORY: Chest wall symmetrical. Respirations even and non-labored. Breath sounds clear to auscultation bilaterally.  CARDIAC: S1, S2 present, regular rate and rhythm. Peripheral pulses 2+ bilaterally.  EXTREMITIES: Without clubbing, cyanosis, or edema.  NEUROLOGIC: No motor or sensory deficits. Steady, even gait.  PSYCH/MENTAL STATUS: Alert, oriented x 3. Cooperative, appropriate mood and affect.    No results found for any visits on 06/06/23.    Assessment & Plan:  Assessment and Plan    Bilateral Otitis Media with Effusion Predominantly right-sided ear pressure and dizziness. No fever, sinus congestion, cough, ear drainage, or sore throat. History of fluid behind the ear. Currently using Flonase nasal spray and Claritin. -Prescribe  Cefdinir -Continue Flonase nasal spray and Claritin daily. -Consider using Dramamine for dizziness if symptoms persist after antibiotic course.       Meds ordered this encounter  Medications   cefdinir (OMNICEF) 300 MG capsule    Sig: Take 1 capsule (300 mg total) by mouth 2 (two) times daily for 7 days.    Dispense:  14 capsule    Refill:  0    Supervising Provider:   Garnette Gunner [[id number]]   No orders of the defined types were placed in this encounter.  Lab Orders  No laboratory test(s) ordered today   No images are attached to the encounter or orders placed in the encounter.  Return if symptoms worsen or fail to improve.   Salvatore Decent, FNP

## 2023-06-14 DIAGNOSIS — M48062 Spinal stenosis, lumbar region with neurogenic claudication: Secondary | ICD-10-CM | POA: Diagnosis not present

## 2023-07-05 DIAGNOSIS — M5459 Other low back pain: Secondary | ICD-10-CM | POA: Diagnosis not present

## 2023-07-05 DIAGNOSIS — Z79891 Long term (current) use of opiate analgesic: Secondary | ICD-10-CM | POA: Diagnosis not present

## 2023-07-05 DIAGNOSIS — Z96651 Presence of right artificial knee joint: Secondary | ICD-10-CM | POA: Diagnosis not present

## 2023-07-05 DIAGNOSIS — R296 Repeated falls: Secondary | ICD-10-CM | POA: Diagnosis not present

## 2023-07-10 ENCOUNTER — Other Ambulatory Visit: Payer: Self-pay | Admitting: *Deleted

## 2023-07-10 DIAGNOSIS — R7303 Prediabetes: Secondary | ICD-10-CM

## 2023-07-10 DIAGNOSIS — N189 Chronic kidney disease, unspecified: Secondary | ICD-10-CM

## 2023-07-10 DIAGNOSIS — E782 Mixed hyperlipidemia: Secondary | ICD-10-CM

## 2023-07-12 ENCOUNTER — Other Ambulatory Visit: Payer: Medicare Other

## 2023-07-12 DIAGNOSIS — R7303 Prediabetes: Secondary | ICD-10-CM | POA: Diagnosis not present

## 2023-07-12 DIAGNOSIS — E782 Mixed hyperlipidemia: Secondary | ICD-10-CM | POA: Diagnosis not present

## 2023-07-12 DIAGNOSIS — N189 Chronic kidney disease, unspecified: Secondary | ICD-10-CM | POA: Diagnosis not present

## 2023-07-13 ENCOUNTER — Other Ambulatory Visit: Payer: Medicare Other

## 2023-07-13 LAB — BASIC METABOLIC PANEL WITH GFR
BUN/Creatinine Ratio: 30 — ABNORMAL HIGH (ref 12–28)
BUN: 35 mg/dL — ABNORMAL HIGH (ref 8–27)
CO2: 25 mmol/L (ref 20–29)
Calcium: 9.9 mg/dL (ref 8.7–10.3)
Chloride: 99 mmol/L (ref 96–106)
Creatinine, Ser: 1.15 mg/dL — ABNORMAL HIGH (ref 0.57–1.00)
Glucose: 87 mg/dL (ref 70–99)
Potassium: 4.7 mmol/L (ref 3.5–5.2)
Sodium: 139 mmol/L (ref 134–144)
eGFR: 48 mL/min/{1.73_m2} — ABNORMAL LOW (ref >59)

## 2023-07-13 LAB — LIPID PANEL
Chol/HDL Ratio: 3.7 ratio (ref 0.0–4.4)
Cholesterol, Total: 162 mg/dL (ref 100–199)
HDL: 44 mg/dL (ref >39)
LDL Chol Calc (NIH): 102 mg/dL — ABNORMAL HIGH (ref 0–99)
Triglycerides: 85 mg/dL (ref 0–149)
VLDL Cholesterol Cal: 16 mg/dL (ref 5–40)

## 2023-07-13 LAB — HEMOGLOBIN A1C
Est. average glucose Bld gHb Est-mCnc: 108 mg/dL
Hgb A1c MFr Bld: 5.4 % (ref 4.8–5.6)

## 2023-07-25 NOTE — Progress Notes (Unsigned)
 Cardiology Office Note   Date:  07/26/2023   ID:  [name], DOB 05-Oct-1940, MRN [id number]  PCP:  Noreene Bearded, PA  Cardiologist:   Ahmad Alert, MD   Chief Complaint  Patient presents with   Aortic Stenosis   Hyperlipidemia   Problem List 1. Essential HTN  2. Chest pressure 3. Arthritis 4. Heart murmur 5. Hyperlipidemia 6. Hiatal hernia    Previous notes.  Tracey Walton is a 83 y.o. female who presents for further evaluation of her HTN Has had HTN for many years.  Her medical doctors at the no longer [facility] have had difficulty getting her blood pressure controlled. She has occasional episodes of chest pressure. These chest pain  Tend occur with exertion and are relieved with rest. They last for several minutes. There is no radiation.  She has chronic shortness of breath but the chest pains are not necessarily associated with worsening shortness breath.  She does not get any regular exercise. She tries to avoid eating excessive salt but admits that she still eats occasional salty foods.  She's had hyperlipidemia for the past 7 or 8 years. She is currently on Pravachol.  June 24, 2014:   Tracey Walton presents follow up of her HTN, and CP.  She was scheduled for an echo and myoview   Echo showed: normal LV systolic function. She has mild aortic stenosis, trivial aortic insufficiency, and mild mitral regurgitation.  Left ventricle: The cavity size was normal. Systolic function was   normal. The estimated ejection fraction was in the range of 55%   to 60%. Wall motion was normal; there were no regional wall   motion abnormalities. Doppler parameters are consistent with   abnormal left ventricular relaxation (grade 1 diastolic   dysfunction). - Aortic valve: Cusp separation was reduced. There was very mild   stenosis. There was trivial regurgitation. Mean gradient (S): 11   mm Hg. - Mitral valve: There was mild regurgitation  She had a Lexiscan  myoview   today.  Initial images look good. No evidence of ischemia  Has been limiting the salt in her diet.    June 8.  2016:  [name] is doing well. BP is well controlled at home.   Dec. 9, 2016  Doing well.  BP is normal at home.  Is always elevated at the office.  No CP,  Has some DOE with walking .  Does not walk regularly .   October 01, 2015: Doing well. BP is normal at home.   Always elevated in the doctors office. Does not exercise.   Back pain.   Needs to use a walker if she goes for any distance.   Has pain in her back and hands.   May 14, 2019:  [name] seen today for a follow-up visit.  She has a history of mild aortic stenosis, hypertension, hyperlipidemia. BP at home are better.  She watches her salt  Uses a cane to walk , has some exertional tightness.   Lexiscan  Myoview  study performed September, 2019 revealed no ischemia.  Her echocardiogram showed some aortic stenosis that perhaps was slightly worse than previous. Some DOE   June 17, 2020: Tracey Walton is seen today for follow-up visit.  She has aortic stenosis, hypertension, hyperlipidemia. Echo from Feb, 2021 showed normal LV function , grade 1 DD, Mild - mod MR , mild AI, mild AS   She has had some episodes of hyponatremia.  Her HCTZ was stopped and since that time she has  had some worsening leg edema.  She has chronic leg edema.  She never elevates her legs.  She sleeps in a recliner and walks with a walker or a cane.   July 26, 2023  Tracey Walton is seen for follow up of her HLD Mild AS , HTN Has some DOE with walking  Uses a walker frequently  Has poor balance   Still watches her great grandchildren   She has tried hydrochlorothiazide  in the past but it causes her to be dizzy. She will watch her salt        Past Medical History:  Diagnosis Date   Anxiety    Arthritis    "qwhere" (08/09/2017)   Chest pain 2011   negative for MI   Chronic kidney disease    renal insufficiency. 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  down after her knee surgery back in 2008   Chronic lower back pain    Depression    Dyspnea    SOB while walking   GERD (gastroesophageal reflux disease)    Heart murmur    History of hiatal hernia    Hyperlipidemia    Hypertension    Mild aortic stenosis    Osteoporosis    Pneumonia ~ 2014   Polymyalgia (HCC)    Strep throat    Vertigo     Past Surgical History:  Procedure Laterality Date   ABDOMINAL HYSTERECTOMY     partial   BILATERAL CARPAL TUNNEL RELEASE  2003-2005   right-left   CATARACT EXTRACTION W/ INTRAOCULAR LENS  IMPLANT, BILATERAL Bilateral    JOINT REPLACEMENT     REVERSE SHOULDER ARTHROPLASTY Left 10/12/2016   Procedure: REVERSE LEFT SHOULDER ARTHROPLASTY;  Surgeon: Ellard Gunning, MD;  Location: [facility] OR;  Service: Orthopedics;  Laterality: Left;   REVERSE SHOULDER ARTHROPLASTY Right 08/09/2017   Procedure: RIGHT REVERSE SHOULDER ARTHROPLASTY;  Surgeon: Ellard Gunning, MD;  Location: [facility] OR;  Service: Orthopedics;  Laterality: Right;   SHOULDER ARTHROSCOPY Right 2003   TOOTH EXTRACTION     "1 tooth" (08/09/2017)   TOTAL KNEE ARTHROPLASTY Bilateral      Current Outpatient Medications  Medication Sig Dispense Refill   acetaminophen  (TYLENOL ) 325 MG tablet Take 650 mg by mouth 4 (four) times daily as needed (for pain.).     amLODipine  (NORVASC ) 5 MG tablet TAKE 1 TABLET BY MOUTH DAILY 90 tablet 3   Baclofen 5 MG TABS Take 1 tablet by mouth 2 (two) times daily as needed.     carvedilol  (COREG ) 6.25 MG tablet TAKE 1 TABLET BY MOUTH TWICE  DAILY 180 tablet 0   cetirizine (ZYRTEC) 10 MG tablet Take 10 mg by mouth daily.     ferrous sulfate  325 (65 FE) MG tablet Take 1 tablet (325 mg total) by mouth daily with breakfast. Take on Monday, Wednesday, Fridays 90 tablet 1   FLUoxetine  (PROZAC ) 20 MG capsule Take 1 capsule (20 mg total) by mouth daily. 90 capsule 3   fluticasone  (FLONASE ) 50 MCG/ACT nasal spray Place 2 sprays into both nostrils daily. 48 g 2    HYDROcodone-acetaminophen  (NORCO/VICODIN) 5-325 MG tablet TAKE 2 TABLETS BY MOUTH THREE TIMES DAILY AS NEEDED     naproxen  (NAPROSYN ) 500 MG tablet TAKE 1 TABLET BY MOUTH TWICE  DAILY WITH MEALS 180 tablet 3   omeprazole  (PRILOSEC) 20 MG capsule TAKE 1 CAPSULE BY MOUTH DAILY 100 capsule 2   ondansetron  (ZOFRAN ) 4 MG tablet Take 1 tablet (4 mg total) by mouth every 8 (eight) hours as  needed for nausea or vomiting. 20 tablet 0   Polyethyl Glycol-Propyl Glycol 0.4-0.3 % SOLN Place 1 drop into both eyes 3 (three) times daily as needed (for dry/irritated eyes.).     rosuvastatin  (CRESTOR ) 20 MG tablet TAKE 1 TABLET BY MOUTH DAILY 100 tablet 1   triamcinolone  cream (KENALOG ) 0.1 % Apply 1 Application topically 2 (two) times daily. 80 g 3   trimethoprim  (TRIMPEX ) 100 MG tablet Take 100 mg by mouth daily. At bed time     coal tar-salicylic acid  2 % shampoo Apply topically daily as needed for itching. (Patient not taking: Reported on 05/03/2023) 236 mL 3   gabapentin (NEURONTIN) 100 MG capsule gabapentin 100 mg capsule  Take 1 capsule twice a day by oral route.  May cause drowsiness (Patient not taking: Reported on 05/03/2023)     methocarbamol (ROBAXIN) 500 MG tablet methocarbamol 500 mg tablet  Take 1 tablet twice a day by oral route as needed. (Patient not taking: Reported on 06/06/2023)     No current facility-administered medications for this visit.    Allergies:   Penicillins    Social History:  The patient  reports that she has never smoked. She has never been exposed to tobacco smoke. She has never used smokeless tobacco. She reports that she does not drink alcohol and does not use drugs.   Family History:  The patient's family history includes Heart attack in her mother; Heart disease in her mother; Heart failure in her father; Hyperlipidemia in her father and sister; Hypertension in her father and sister; Kidney failure in her father; Stroke in her father.    ROS:  Please see the history of  present illness.    Physical Exam: Blood pressure (!) 146/80, pulse 64, height 5\' 3"  (1.6 m), weight 195 lb (88.5 kg), SpO2 96%.  HYPERTENSION CONTROL Vitals:   07/26/23 0819 07/26/23 1133  BP: (!) 154/82 (!) 146/80    The patient's blood pressure is elevated above target today.  In order to address the patient's elevated BP: Blood pressure will be monitored at home to determine if medication changes need to be made.       GEN:  Well nourished, well developed in no acute distress HEENT: Normal NECK: No JVD; No carotid bruits LYMPHATICS: No lymphadenopathy CARDIAC: RRR , 2/6 systolic murmur  RESPIRATORY:  Clear to auscultation without rales, wheezing or rhonchi  ABDOMEN: Soft, non-tender, non-distended MUSCULOSKELETAL:  1 + leg edema; No deformity  SKIN: Warm and dry NEUROLOGIC:  Alert and oriented x 3     EKG:  EKG Interpretation Date/Time:  Thursday July 26 2023 08:22:23 EDT Ventricular Rate:  64 PR Interval:  188 QRS Duration:  100 QT Interval:  396 QTC Calculation: 408 R Axis:   12  Text Interpretation: Sinus rhythm with Premature atrial complexes Cannot rule out Anterior infarct , age undetermined When compared with ECG of 09-Oct-2016 08:56, Premature atrial complexes are now Present No significant change since last tracing Confirmed by Ahmad Alert [phone]) on 07/26/2023 8:38:36 AM    Recent Labs: 02/01/2023: TSH 0.991 04/12/2023: ALT 14; Hemoglobin 12.9; Platelets 331 07/12/2023: BUN 35; Creatinine, Ser 1.15; Potassium 4.7; Sodium 139    Lipid Panel    Component Value Date/Time   CHOL 162 07/12/2023 0856   TRIG 85 07/12/2023 0856   HDL 44 07/12/2023 0856   CHOLHDL 3.7 07/12/2023 0856   CHOLHDL 3.6 10/01/2015 0746   VLDL 14 10/01/2015 0746   LDLCALC 102 (H) 07/12/2023 0856  Wt Readings from Last 3 Encounters:  07/26/23 195 lb (88.5 kg)  06/06/23 199 lb (90.3 kg)  04/19/23 198 lb (89.8 kg)      Other studies Reviewed: Additional studies/  records that were reviewed today include: . Review of the above records demonstrates:    ASSESSMENT AND PLAN:  1. Essential HTN  - .   BP remains elevated.   She has tried hydrochlorothiazide  but it caused dizziness.   She will try to take some intermittantly and see how she does.  Advised salt restriction      2. Chest pressure-      3. Aortic stenosis:   has moderate AS /AI .  Will repeat echo.   She may need a referral to the valve team as her AS / AI worsens   4. Hyperlipidemia -      Current medicines are reviewed at length with the patient today.  The patient does not have concerns regarding medicines.  The following changes have been made:  no change   Disposition:    Will see me in 1 year    Signed, Ahmad Alert, MD  07/26/2023 11:33 AM    [facility] [address] [place], [place], [place]  [address] Phone: [phone]; Fax: [phone]

## 2023-07-26 ENCOUNTER — Ambulatory Visit: Payer: Medicare Other | Attending: Cardiovascular Disease | Admitting: Cardiovascular Disease

## 2023-07-26 ENCOUNTER — Encounter: Payer: Self-pay | Admitting: Cardiovascular Disease

## 2023-07-26 ENCOUNTER — Ambulatory Visit: Payer: Medicare Other | Admitting: Family Medicine

## 2023-07-26 VITALS — BP 146/80 | HR 64 | Ht 63.0 in | Wt 195.0 lb

## 2023-07-26 DIAGNOSIS — G8929 Other chronic pain: Secondary | ICD-10-CM | POA: Diagnosis not present

## 2023-07-26 DIAGNOSIS — I10 Essential (primary) hypertension: Secondary | ICD-10-CM

## 2023-07-26 DIAGNOSIS — I350 Nonrheumatic aortic (valve) stenosis: Secondary | ICD-10-CM | POA: Diagnosis not present

## 2023-07-26 DIAGNOSIS — E782 Mixed hyperlipidemia: Secondary | ICD-10-CM | POA: Diagnosis not present

## 2023-07-26 DIAGNOSIS — M545 Low back pain, unspecified: Secondary | ICD-10-CM

## 2023-07-26 NOTE — Patient Instructions (Signed)
 Medication Instructions:   *If you need a refill on your cardiac medications before your next appointment, please call your pharmacy*  Lab Work:  If you have labs (blood work) drawn today and your tests are completely normal, you will receive your results only by: MyChart Message (if you have MyChart) OR A paper copy in the mail If you have any lab test that is abnormal or we need to change your treatment, we will call you to review the results.  Testing/Procedures: Your physician has requested that you have an echocardiogram. Echocardiography is a painless test that uses sound waves to create images of your heart. It provides your doctor with information about the size and shape of your heart and how well your heart's chambers and valves are working. This procedure takes approximately one hour. There are no restrictions for this procedure. Please do NOT wear cologne, perfume, aftershave, or lotions (deodorant is allowed). Please arrive 15 minutes prior to your appointment time.  Please note: We ask at that you not bring children with you during ultrasound (echo/ vascular) testing. Due to room size and safety concerns, children are not allowed in the ultrasound rooms during exams. Our front office staff cannot provide observation of children in our lobby area while testing is being conducted. An adult accompanying a patient to their appointment will only be allowed in the ultrasound room at the discretion of the ultrasound technician under special circumstances. We apologize for any inconvenience.   Follow-Up: At [facility], you and your health needs are our priority.  As part of our continuing mission to provide you with exceptional heart care, our providers are all part of one team.  This team includes your primary Cardiologist (physician) and Advanced Practice Providers or APPs (Physician Assistants and Nurse Practitioners) who all work together to provide you with the care you  need, when you need it.  Your next appointment:  6 months with an APP or follow up MD for Dr Alroy Aspen   We recommend signing up for the patient portal called "MyChart".  Sign up information is provided on this After Visit Summary.  MyChart is used to connect with patients for Virtual Visits (Telemedicine).  Patients are able to view lab/test results, encounter notes, upcoming appointments, etc.  Non-urgent messages can be sent to your provider as well.   To learn more about what you can do with MyChart, go to [email or web address].   Other Instructions       1st Floor: - Lobby - Registration  - Pharmacy  - Lab - Cafe  2nd Floor: - PV Lab - Diagnostic Testing (echo, CT, nuclear med)  3rd Floor: - Vacant  4th Floor: - TCTS (cardiothoracic surgery) - AFib Clinic - Structural Heart Clinic - Vascular Surgery  - Vascular Ultrasound  5th Floor: - [facility]Care Cardiology (general and EP) - Clinical Pharmacy for coumadin, hypertension, lipid, weight-loss medications, and med management appointments    Valet parking services will be available as well.

## 2023-07-26 NOTE — Progress Notes (Signed)
   Established Patient Office Visit  Subjective   Patient ID: Tracey Walton, female    DOB: 08-30-1940  Age: 83 y.o. MRN: [id number]  Chief Complaint  Patient presents with   Medical Management of Chronic Issues    HPI  Htn -patient takes amlodipine  and carvedilol .  Does not tolerate hydrochlorothiazide .  We discussed other blood pressure medications for her hypertension.  She is elevated today and multiple other visits.  Patient takes Prozac  for depression.  No issues with this medication.  Patient complains of being fatigued.  We discussed her recent cardiology visit and upcoming echo.  Discussed waiting until after the echo results before deciding further workup on fatigue.   The ASCVD Risk score (Arnett DK, et al., 2019) failed to calculate for the following reasons:   The 2019 ASCVD risk score is only valid for ages 62 to 48  Health Maintenance Due  Topic Date Due   DTaP/Tdap/Td (1 - Tdap) Never done   DEXA SCAN  Never done   COVID-19 Vaccine (5 - Moderna risk 2024-25 season) 07/31/2023      Objective:     BP (!) 158/85   Pulse 67   Ht 5\' 3"  (1.6 m)   Wt 196 lb 1.9 oz (89 kg)   SpO2 95%   BMI 34.74 kg/m    Physical Exam General: Alert and oriented CV: Regular rate rhythm Pulmonary: Lungs clear bilaterally   No results found for any visits on 07/27/23.      Assessment & Plan:   Essential hypertension Assessment & Plan: Remains elevated.  On amlodipine  and carvedilol .  Intolerant of thiazide.  Was on lisinopril/HCTZ combo pill, this was then switched to HCTZ only but it appears the thiazide is what she was intolerant of.  I do not see where she has tried any other ARB's or restarted the lisinopril.  She has CKD but GFR is 48 and this could help slow the progression of her CKD.  Will monitor creatinine closely with rechecking in 2 weeks for nurse visit and again in 4 weeks when I see her   Depression, unspecified depression type Assessment &  Plan: Stable.  Continue Prozac  20 mg.   Other fatigue Assessment & Plan: Possibly related to worsening of aortic stenosis so we will wait until her echo results before we consider next step in workup   Other orders -     Valsartan; Take 0.5 tablets (20 mg total) by mouth daily.  Dispense: 45 tablet; Refill: 1     Return in about 4 weeks (around 08/24/2023) for HTN.    Laneta Pintos, MD

## 2023-07-27 ENCOUNTER — Ambulatory Visit (INDEPENDENT_AMBULATORY_CARE_PROVIDER_SITE_OTHER): Admitting: Family Medicine

## 2023-07-27 ENCOUNTER — Encounter: Payer: Self-pay | Admitting: Family Medicine

## 2023-07-27 ENCOUNTER — Ambulatory Visit: Payer: Medicare Other | Admitting: Family Medicine

## 2023-07-27 VITALS — BP 158/85 | HR 67 | Ht 63.0 in | Wt 196.1 lb

## 2023-07-27 DIAGNOSIS — F32A Depression, unspecified: Secondary | ICD-10-CM | POA: Diagnosis not present

## 2023-07-27 DIAGNOSIS — I10 Essential (primary) hypertension: Secondary | ICD-10-CM | POA: Diagnosis not present

## 2023-07-27 DIAGNOSIS — R5383 Other fatigue: Secondary | ICD-10-CM | POA: Diagnosis not present

## 2023-07-27 MED ORDER — VALSARTAN 40 MG PO TABS: 20.0000 mg | ORAL_TABLET | Freq: Every day | ORAL | 1 refills | Status: DC

## 2023-07-27 NOTE — Patient Instructions (Signed)
 It was nice to see you today,  We addressed the following topics today: -I am starting valsartan.  I would like you to take a half a tablet a day.  Continue taking your amlodipine  and carvedilol  as well.  You do not need to take your HCTZ while you are taking this. - I would like you to have your creatinine checked again in 2 weeks and then again in 4 weeks when I have an appointment with you - Your fatigue could be due to your heart issues.  We will wait to see what the echo says before deciding on any further workup.  Have a great day,  Etha Henle, MD

## 2023-07-29 DIAGNOSIS — R5383 Other fatigue: Secondary | ICD-10-CM | POA: Insufficient documentation

## 2023-07-29 NOTE — Assessment & Plan Note (Signed)
 Possibly related to worsening of aortic stenosis so we will wait until her echo results before we consider next step in workup

## 2023-07-29 NOTE — Assessment & Plan Note (Signed)
 Remains elevated.  On amlodipine  and carvedilol .  Intolerant of thiazide.  Was on lisinopril/HCTZ combo pill, this was then switched to HCTZ only but it appears the thiazide is what she was intolerant of.  I do not see where she has tried any other ARB's or restarted the lisinopril.  She has CKD but GFR is 48 and this could help slow the progression of her CKD.  Will monitor creatinine closely with rechecking in 2 weeks for nurse visit and again in 4 weeks when I see her

## 2023-07-29 NOTE — Assessment & Plan Note (Signed)
 Stable.  Continue Prozac  20 mg.

## 2023-07-30 DIAGNOSIS — N3021 Other chronic cystitis with hematuria: Secondary | ICD-10-CM | POA: Diagnosis not present

## 2023-07-31 ENCOUNTER — Other Ambulatory Visit: Payer: Self-pay | Admitting: Cardiovascular Disease

## 2023-07-31 DIAGNOSIS — I10 Essential (primary) hypertension: Secondary | ICD-10-CM

## 2023-08-06 ENCOUNTER — Telehealth: Payer: Self-pay | Admitting: *Deleted

## 2023-08-06 ENCOUNTER — Other Ambulatory Visit (HOSPITAL_COMMUNITY)

## 2023-08-06 NOTE — Telephone Encounter (Signed)
 No fasting, just a bmp

## 2023-08-06 NOTE — Telephone Encounter (Signed)
 Copied from CRM [id number]. Topic: Clinical - Request for Lab/Test Order >> Aug 06, 2023  3:04 PM Tracey Walton wrote: Reason for CRM: Patient was in to see Dr on 07/27/23. Was advised to have her Creatine checked again in 2 weeks. Is requesting order to be placed so she can schedule a lab visit.   Patient can be reached at [phone]

## 2023-08-06 NOTE — Telephone Encounter (Signed)
 Contacted pt and scheduled her a lab visit for the 16th and a follow up on 09/06/23 with provider, will she need to fast for these?

## 2023-08-08 ENCOUNTER — Other Ambulatory Visit: Payer: Self-pay | Admitting: *Deleted

## 2023-08-08 DIAGNOSIS — I10 Essential (primary) hypertension: Secondary | ICD-10-CM

## 2023-08-08 NOTE — Telephone Encounter (Signed)
 Pt informed of below.

## 2023-08-16 ENCOUNTER — Other Ambulatory Visit

## 2023-08-16 DIAGNOSIS — I10 Essential (primary) hypertension: Secondary | ICD-10-CM

## 2023-08-17 ENCOUNTER — Ambulatory Visit: Payer: Self-pay | Admitting: Family Medicine

## 2023-08-17 ENCOUNTER — Other Ambulatory Visit

## 2023-08-17 LAB — BASIC METABOLIC PANEL WITH GFR
BUN/Creatinine Ratio: 31 — ABNORMAL HIGH (ref 12–28)
BUN: 34 mg/dL — ABNORMAL HIGH (ref 8–27)
CO2: 22 mmol/L (ref 20–29)
Calcium: 9.2 mg/dL (ref 8.7–10.3)
Chloride: 99 mmol/L (ref 96–106)
Creatinine, Ser: 1.08 mg/dL — ABNORMAL HIGH (ref 0.57–1.00)
Glucose: 88 mg/dL (ref 70–99)
Potassium: 5 mmol/L (ref 3.5–5.2)
Sodium: 138 mmol/L (ref 134–144)
eGFR: 51 mL/min/{1.73_m2} — ABNORMAL LOW (ref >59)

## 2023-08-24 ENCOUNTER — Other Ambulatory Visit (HOSPITAL_COMMUNITY)

## 2023-08-26 ENCOUNTER — Other Ambulatory Visit: Payer: Self-pay | Admitting: Cardiovascular Disease

## 2023-09-05 ENCOUNTER — Other Ambulatory Visit: Payer: Self-pay | Admitting: Family Medicine

## 2023-09-06 ENCOUNTER — Ambulatory Visit (HOSPITAL_COMMUNITY)
Admission: RE | Admit: 2023-09-06 | Discharge: 2023-09-06 | Disposition: A | Discharge status: Home / Self Care | Source: Ambulatory Visit | Attending: Internal Medicine | Admitting: Internal Medicine

## 2023-09-06 ENCOUNTER — Ambulatory Visit (INDEPENDENT_AMBULATORY_CARE_PROVIDER_SITE_OTHER): Admitting: Family Medicine

## 2023-09-06 ENCOUNTER — Encounter: Payer: Self-pay | Admitting: Family Medicine

## 2023-09-06 VITALS — BP 121/72 | HR 76 | Ht 63.0 in | Wt 193.8 lb

## 2023-09-06 DIAGNOSIS — I350 Nonrheumatic aortic (valve) stenosis: Secondary | ICD-10-CM | POA: Diagnosis not present

## 2023-09-06 DIAGNOSIS — I10 Essential (primary) hypertension: Secondary | ICD-10-CM | POA: Diagnosis not present

## 2023-09-06 LAB — ECHOCARDIOGRAM COMPLETE
AR max vel: 1.43 cm2
AV Area VTI: 1.44 cm2
AV Area mean vel: 1.43 cm2
AV Mean grad: 46 mmHg
AV Peak grad: 65 mmHg
Ao pk vel: 4.03 m/s
Area-P 1/2: 4.06 cm2
P 1/2 time: 395 ms
S' Lateral: 2.4 cm

## 2023-09-06 NOTE — Progress Notes (Signed)
   Established Patient Office Visit  Subjective   Patient ID: Tracey Walton, female    DOB: 09-11-1940  Age: 83 y.o. MRN: [id number]  Chief Complaint  Patient presents with   Medical Management of Chronic Issues    HPI  Subjective - Hypertension: BP at home 124-126 - Shoulder fracture 3 years ago from fall - Height loss from 5'6" to 5'3" - Declined bone density scan  Medications: Valsartan , Amlodipine , Carvedilol  for hypertension  PMH, PSH, FH, Social Hx: Hypertension, bilateral knee replacements, bilateral shoulder replacements, arthritis, shoulder fracture 3 years ago from fall  ROS: Height loss from 5'6" to 5'3"   The ASCVD Risk score (Arnett DK, et al., 2019) failed to calculate for the following reasons:   The 2019 ASCVD risk score is only valid for ages 50 to 42  Health Maintenance Due  Topic Date Due   DTaP/Tdap/Td (1 - Tdap) Never done   DEXA SCAN  Never done   COVID-19 Vaccine (5 - Moderna risk 2024-25 season) 07/31/2023      Objective:     BP (!) 147/80   Pulse 76   Ht 5\' 3"  (1.6 m)   Wt 193 lb 12 oz (87.9 kg)   SpO2 96%   BMI 34.32 kg/m    Physical Exam Gen: alert, oriented Pulm: no resp distress Psych: pleasant affect.    No results found for any visits on 09/06/23.      Assessment & Plan:   Essential hypertension Assessment & Plan: - Home BP readings 124-126, improved from previous visit - Current medications: Valsartan , Amlodipine , Carvedilol  - Renal function checked on 07/27/2023 after starting Valsartan , results normal - Continue current medication regimen      Return in about 6 months (around 03/07/2024) for HTN.    Laneta Pintos, MD

## 2023-09-06 NOTE — Assessment & Plan Note (Signed)
-   Home BP readings 124-126, improved from previous visit - Current medications: Valsartan , Amlodipine , Carvedilol  - Renal function checked on 07/27/2023 after starting Valsartan , results normal - Continue current medication regimen

## 2023-09-06 NOTE — Patient Instructions (Signed)
 It was nice to see you today,  We addressed the following topics today: Your blood pressure was better today.  We will not change your medication today.  We will follow-up in 6 months. - If you decide to change your mind on the bone density scan let us  know and we can order it.  Have a great day,  Tracey Henle, MD

## 2023-09-07 ENCOUNTER — Ambulatory Visit: Payer: Self-pay | Admitting: Cardiovascular Disease

## 2023-09-27 DIAGNOSIS — M48062 Spinal stenosis, lumbar region with neurogenic claudication: Secondary | ICD-10-CM | POA: Diagnosis not present
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
## 2023-10-15 ENCOUNTER — Other Ambulatory Visit: Payer: Self-pay

## 2023-10-15 ENCOUNTER — Ambulatory Visit: Attending: Internal Medicine | Admitting: Internal Medicine

## 2023-10-15 ENCOUNTER — Encounter: Payer: Self-pay | Admitting: Internal Medicine

## 2023-10-15 VITALS — BP 160/64 | HR 76 | Ht 64.0 in | Wt 183.0 lb

## 2023-10-15 DIAGNOSIS — I10 Essential (primary) hypertension: Secondary | ICD-10-CM | POA: Diagnosis not present

## 2023-10-15 DIAGNOSIS — N1831 Chronic kidney disease, stage 3a: Secondary | ICD-10-CM

## 2023-10-15 DIAGNOSIS — I350 Nonrheumatic aortic (valve) stenosis: Secondary | ICD-10-CM | POA: Diagnosis not present

## 2023-10-15 DIAGNOSIS — E782 Mixed hyperlipidemia: Secondary | ICD-10-CM

## 2023-10-15 NOTE — Patient Instructions (Addendum)
 Medication Instructions:  Your physician recommends that you continue on your current medications as directed. Please refer to the Current Medication list given to you today.  *If you need a refill on your cardiac medications before your next appointment, please call your pharmacy*  Lab Work: Your physician recommends that you have lab work today- BMET and CBC  If you have labs (blood work) drawn today and your tests are completely normal, you will receive your results only by: MyChart Message (if you have MyChart) OR A paper copy in the mail If you have any lab test that is abnormal or we need to change your treatment, we will call you to review the results.  Testing/Procedures: Your physician has requested that you have a cardiac right and left catheterization. Cardiac catheterization is used to diagnose and/or treat various heart conditions. Doctors may recommend this procedure for a number of different reasons. The most common reason is to evaluate chest pain. Chest pain can be a symptom of coronary artery disease (CAD), and cardiac catheterization can show whether plaque is narrowing or blocking your heart's arteries. This procedure is also used to evaluate the valves, as well as measure the blood flow and oxygen levels in different parts of your heart. For further information please visit [email or web address] Please follow instruction sheet, as given.    [facility] A DEPT OF [facility] [address] A DEPT OF THE [place] [address] [place] [address] Dept: [phone] Loc: [phone]  Tracey Walton  10/15/2023  You are scheduled for a  Left and Right Cardiac Catheterization on Friday, July 18 with Dr. Lurena Red.  1. Please arrive at the [facility] (Main Entrance A) at [facility]: [address] [place], [place] [address] at 10:00 AM (This time is 2 hour(s) before your procedure to ensure your  preparation).   Free valet parking service is available. You will check in at ADMITTING. The support person will be asked to wait in the waiting room.  It is OK to have someone drop you off and come back when you are ready to be discharged.    Special note: Every effort is made to have your procedure done on time. Please understand that emergencies sometimes delay scheduled procedures.  2. Diet: Do not eat solid foods after midnight.  The patient may have clear liquids until 5am upon the day of the procedure.  3. Labs: You will need to have blood drawn on BMET and CBC TODAY   4. Medication instructions in preparation for your procedure:   Contrast Allergy: No   On the morning of your procedure, take your Aspirin 81 mg and any morning medicines NOT listed above.  You may use sips of water.  5. Plan to go home the same day, you will only stay overnight if medically necessary. 6. Bring a current list of your medications and current insurance cards. 7. You MUST have a responsible person to drive you home. 8. Someone MUST be with you the first 24 hours after you arrive home or your discharge will be delayed. 9. Please wear clothes that are easy to get on and off and wear slip-on shoes.  Thank you for allowing us  to care for you!   -- [facility]    Follow-Up: At [facility], you and your health needs are our priority.  As part of our continuing mission to provide you  with exceptional heart care, our providers are all part of one team.  This team includes your primary Cardiologist (physician) and Advanced Practice Providers or APPs (Physician Assistants and Nurse Practitioners) who all work together to provide you with the care you need, when you need it.  Your next appointment:   Will depend on testing  Provider:   Lurena Red, MD    We recommend signing up for the patient portal called MyChart.  Sign up information is provided on this  After Visit Summary.  MyChart is used to connect with patients for Virtual Visits (Telemedicine).  Patients are able to view lab/test results, encounter notes, upcoming appointments, etc.  Non-urgent messages can be sent to your provider as well.   To learn more about what you can do with MyChart, go to [email or web address].   Other Instructions

## 2023-10-15 NOTE — Progress Notes (Signed)
 Pre Surgical Assessment: 5 M Walk Test  29M=16.48ft  5 Meter Walk Test- trial 1: 8.45 seconds 5 Meter Walk Test- trial 2: 8.31 seconds 5 Meter Walk Test- trial 3: 7.91 seconds 5 Meter Walk Test Average: 8.22 seconds

## 2023-10-16 ENCOUNTER — Ambulatory Visit: Payer: Self-pay | Admitting: Internal Medicine

## 2023-10-16 ENCOUNTER — Other Ambulatory Visit: Payer: Self-pay

## 2023-10-16 DIAGNOSIS — I350 Nonrheumatic aortic (valve) stenosis: Secondary | ICD-10-CM

## 2023-10-16 LAB — BASIC METABOLIC PANEL WITH GFR
BUN/Creatinine Ratio: 34 — ABNORMAL HIGH (ref 12–28)
BUN: 42 mg/dL — ABNORMAL HIGH (ref 8–27)
CO2: 21 mmol/L (ref 20–29)
Calcium: 9.4 mg/dL (ref 8.7–10.3)
Chloride: 104 mmol/L (ref 96–106)
Creatinine, Ser: 1.22 mg/dL — ABNORMAL HIGH (ref 0.57–1.00)
Glucose: 82 mg/dL (ref 70–99)
Potassium: 4.6 mmol/L (ref 3.5–5.2)
Sodium: 140 mmol/L (ref 134–144)
eGFR: 44 mL/min/1.73 — ABNORMAL LOW (ref >59)

## 2023-10-16 LAB — CBC WITH DIFFERENTIAL/PLATELET
Basophils Absolute: 0 x10E3/uL (ref 0.0–0.2)
Basos: 1 %
EOS (ABSOLUTE): 0.2 x10E3/uL (ref 0.0–0.4)
Eos: 3 %
Hematocrit: 38.5 % (ref 34.0–46.6)
Hemoglobin: 12.1 g/dL (ref 11.1–15.9)
Immature Grans (Abs): 0 x10E3/uL (ref 0.0–0.1)
Immature Granulocytes: 0 %
Lymphocytes Absolute: 1.2 x10E3/uL (ref 0.7–3.1)
Lymphs: 18 %
MCH: 30.6 pg (ref 26.6–33.0)
MCHC: 31.4 g/dL — ABNORMAL LOW (ref 31.5–35.7)
MCV: 98 fL — ABNORMAL HIGH (ref 79–97)
Monocytes Absolute: 0.6 x10E3/uL (ref 0.1–0.9)
Monocytes: 9 %
Neutrophils Absolute: 4.5 x10E3/uL (ref 1.4–7.0)
Neutrophils: 69 %
Platelets: 284 x10E3/uL (ref 150–450)
RBC: 3.95 x10E6/uL (ref 3.77–5.28)
RDW: 13.1 % (ref 11.7–15.4)
WBC: 6.5 x10E3/uL (ref 3.4–10.8)

## 2023-10-17 ENCOUNTER — Telehealth: Payer: Self-pay | Admitting: *Deleted

## 2023-10-17 NOTE — Telephone Encounter (Signed)
 Cardiac Catheterization scheduled at [facility] for: Friday October 19, 2023 12 Noon Arrival time [facility] Main Entrance A at: 7 AM-pre-procedure hydration-per protocol GFR < 45 (44)  Nothing to eat after midnight prior to procedure, clear liquids until 5 AM day of procedure.  Medication instructions: -Hold:  Naprosyn /Valsartan -day before and day of procedure -per protocol GFR < 60 (44) -Other usual morning medications can be taken with sips of water including aspirin  81 mg.  Plan to go home the same day, you will only stay overnight if medically necessary.  You must have responsible adult to drive you home.  Someone must be with you the first 24 hours after you arrive home.   Reviewed procedure instructions/pre-procedure hydration with patient.

## 2023-10-19 ENCOUNTER — Other Ambulatory Visit: Payer: Self-pay

## 2023-10-19 ENCOUNTER — Encounter (HOSPITAL_COMMUNITY)
Admission: RE | Disposition: A | Discharge status: Home / Self Care | Payer: Self-pay | Source: Home / Self Care | Attending: Internal Medicine

## 2023-10-19 ENCOUNTER — Ambulatory Visit (HOSPITAL_COMMUNITY)
Admission: RE | Admit: 2023-10-19 | Discharge: 2023-10-19 | Disposition: A | Discharge status: Home / Self Care | Attending: Internal Medicine | Admitting: Internal Medicine

## 2023-10-19 DIAGNOSIS — E782 Mixed hyperlipidemia: Secondary | ICD-10-CM | POA: Diagnosis not present

## 2023-10-19 DIAGNOSIS — I350 Nonrheumatic aortic (valve) stenosis: Secondary | ICD-10-CM | POA: Diagnosis not present

## 2023-10-19 DIAGNOSIS — I129 Hypertensive chronic kidney disease with stage 1 through stage 4 chronic kidney disease, or unspecified chronic kidney disease: Secondary | ICD-10-CM | POA: Insufficient documentation

## 2023-10-19 DIAGNOSIS — N1831 Chronic kidney disease, stage 3a: Secondary | ICD-10-CM | POA: Diagnosis not present

## 2023-10-19 DIAGNOSIS — I2510 Atherosclerotic heart disease of native coronary artery without angina pectoris: Secondary | ICD-10-CM | POA: Diagnosis not present

## 2023-10-19 DIAGNOSIS — Z79899 Other long term (current) drug therapy: Secondary | ICD-10-CM | POA: Insufficient documentation

## 2023-10-19 HISTORY — PX: RIGHT HEART CATH AND CORONARY ANGIOGRAPHY: CATH118264

## 2023-10-19 LAB — POCT I-STAT EG7
Acid-Base Excess: 0 mmol/L (ref 0.0–2.0)
Acid-Base Excess: 0 mmol/L (ref 0.0–2.0)
Bicarbonate: 25.5 mmol/L (ref 20.0–28.0)
Bicarbonate: 25.6 mmol/L (ref 20.0–28.0)
Calcium, Ion: 1.23 mmol/L (ref 1.15–1.40)
Calcium, Ion: 1.25 mmol/L (ref 1.15–1.40)
HCT: 37 % (ref 36.0–46.0)
HCT: 37 % (ref 36.0–46.0)
Hemoglobin: 12.6 g/dL (ref 12.0–15.0)
Hemoglobin: 12.6 g/dL (ref 12.0–15.0)
O2 Saturation: 63 %
O2 Saturation: 65 %
Potassium: 4.3 mmol/L (ref 3.5–5.1)
Potassium: 4.3 mmol/L (ref 3.5–5.1)
Sodium: 142 mmol/L (ref 135–145)
Sodium: 142 mmol/L (ref 135–145)
TCO2: 27 mmol/L (ref 22–32)
TCO2: 27 mmol/L (ref 22–32)
pCO2, Ven: 44.1 mmHg (ref 44–60)
pCO2, Ven: 44.7 mmHg (ref 44–60)
pH, Ven: 7.366 (ref 7.25–7.43)
pH, Ven: 7.37 (ref 7.25–7.43)
pO2, Ven: 34 mmHg (ref 32–45)
pO2, Ven: 35 mmHg (ref 32–45)

## 2023-10-19 LAB — POCT I-STAT 7, (LYTES, BLD GAS, ICA,H+H)
Acid-base deficit: 1 mmol/L (ref 0.0–2.0)
Bicarbonate: 24.3 mmol/L (ref 20.0–28.0)
Calcium, Ion: 1.24 mmol/L (ref 1.15–1.40)
HCT: 37 % (ref 36.0–46.0)
Hemoglobin: 12.6 g/dL (ref 12.0–15.0)
O2 Saturation: 90 %
Potassium: 4.3 mmol/L (ref 3.5–5.1)
Sodium: 142 mmol/L (ref 135–145)
TCO2: 25 mmol/L (ref 22–32)
pCO2 arterial: 39.6 mmHg (ref 32–48)
pH, Arterial: 7.396 (ref 7.35–7.45)
pO2, Arterial: 59 mmHg — ABNORMAL LOW (ref 83–108)

## 2023-10-19 SURGERY — RIGHT HEART CATH AND CORONARY ANGIOGRAPHY
Anesthesia: LOCAL

## 2023-10-19 MED ORDER — SODIUM CHLORIDE 0.9 % IV SOLN: INTRAVENOUS | Status: DC

## 2023-10-19 MED ORDER — ONDANSETRON HCL 4 MG/2ML IJ SOLN: 4.0000 mg | Freq: Four times a day (QID) | INTRAMUSCULAR | Status: DC | PRN

## 2023-10-19 MED ORDER — HEPARIN (PORCINE) IN NACL 2-0.9 UNITS/ML
INTRAMUSCULAR | Status: DC | PRN
  Administered 2023-10-19: 5 mL via INTRA_ARTERIAL

## 2023-10-19 MED ORDER — VERAPAMIL HCL 2.5 MG/ML IV SOLN
INTRAVENOUS | Status: AC
  Filled 2023-10-19: qty 2

## 2023-10-19 MED ORDER — HYDRALAZINE HCL 20 MG/ML IJ SOLN: 10.0000 mg | INTRAMUSCULAR | Status: DC | PRN

## 2023-10-19 MED ORDER — LABETALOL HCL 5 MG/ML IV SOLN: 10.0000 mg | INTRAVENOUS | Status: DC | PRN

## 2023-10-19 MED ORDER — FENTANYL CITRATE (PF) 100 MCG/2ML IJ SOLN
INTRAMUSCULAR | Status: AC
Start: 2023-10-19 — End: 2023-10-19
  Filled 2023-10-19: qty 2

## 2023-10-19 MED ORDER — LIDOCAINE HCL (PF) 1 % IJ SOLN
INTRAMUSCULAR | Status: AC
  Filled 2023-10-19: qty 30

## 2023-10-19 MED ORDER — MIDAZOLAM HCL 2 MG/2ML IJ SOLN
INTRAMUSCULAR | Status: AC
  Filled 2023-10-19: qty 2

## 2023-10-19 MED ORDER — SODIUM CHLORIDE 0.9 % WEIGHT BASED INFUSION: 1.0000 mL/kg/h | INTRAVENOUS | Status: DC

## 2023-10-19 MED ORDER — SODIUM CHLORIDE 0.9% FLUSH
3.0000 mL | INTRAVENOUS | Status: DC | PRN
Start: 2023-10-19 — End: 2023-10-19

## 2023-10-19 MED ORDER — FENTANYL CITRATE (PF) 100 MCG/2ML IJ SOLN
INTRAMUSCULAR | Status: DC | PRN
  Administered 2023-10-19: 25 ug via INTRAVENOUS

## 2023-10-19 MED ORDER — ACETAMINOPHEN 325 MG PO TABS: 650.0000 mg | ORAL_TABLET | ORAL | Status: DC | PRN

## 2023-10-19 MED ORDER — SODIUM CHLORIDE 0.9 % WEIGHT BASED INFUSION
3.0000 mL/kg/h | INTRAVENOUS | Status: DC
  Administered 2023-10-19: 3 mL/kg/h via INTRAVENOUS

## 2023-10-19 MED ORDER — MIDAZOLAM HCL 2 MG/2ML IJ SOLN
INTRAMUSCULAR | Status: DC | PRN
  Administered 2023-10-19: 1 mg via INTRAVENOUS

## 2023-10-19 MED ORDER — HEPARIN (PORCINE) IN NACL 1000-0.9 UT/500ML-% IV SOLN
INTRAVENOUS | Status: DC | PRN
  Administered 2023-10-19 (×2): 500 mL

## 2023-10-19 MED ORDER — HEPARIN SODIUM (PORCINE) 1000 UNIT/ML IJ SOLN
INTRAMUSCULAR | Status: AC
  Filled 2023-10-19: qty 10

## 2023-10-19 MED ORDER — ASPIRIN 81 MG PO CHEW: 81.0000 mg | CHEWABLE_TABLET | ORAL | Status: DC

## 2023-10-19 MED ORDER — LIDOCAINE HCL (PF) 1 % IJ SOLN
INTRAMUSCULAR | Status: DC | PRN
  Administered 2023-10-19: 1 mL
  Administered 2023-10-19 (×2): 2 mL

## 2023-10-19 MED ORDER — HEPARIN SODIUM (PORCINE) 1000 UNIT/ML IJ SOLN
INTRAMUSCULAR | Status: DC | PRN
  Administered 2023-10-19: 5000 [IU] via INTRAVENOUS

## 2023-10-19 MED ORDER — IOHEXOL 350 MG/ML SOLN
INTRAVENOUS | Status: DC | PRN
  Administered 2023-10-19: 60 mL

## 2023-10-19 MED ORDER — SODIUM CHLORIDE 0.9% FLUSH: 3.0000 mL | Freq: Two times a day (BID) | INTRAVENOUS | Status: DC

## 2023-10-19 MED ORDER — SODIUM CHLORIDE 0.9 % IV SOLN: 250.0000 mL | INTRAVENOUS | Status: DC | PRN

## 2023-10-19 SURGICAL SUPPLY — 9 items
CATH BALLN WEDGE 5F 110CM (CATHETERS) IMPLANT
CATH INFINITI 6F ANG MULTIPACK (CATHETERS) IMPLANT
DEVICE RAD TR BAND REGULAR (VASCULAR PRODUCTS) IMPLANT
GLIDESHEATH SLEND SS 6F .021 (SHEATH) IMPLANT
GUIDEWIRE ANGLED .035X150CM (WIRE) IMPLANT
PACK CARDIAC CATHETERIZATION (CUSTOM PROCEDURE TRAY) ×1 IMPLANT
SET ATX-X65L (MISCELLANEOUS) IMPLANT
SHEATH GLIDE SLENDER 4/5FR (SHEATH) IMPLANT
WIRE EMERALD 3MM-J .035X260CM (WIRE) IMPLANT

## 2023-10-19 NOTE — Discharge Instructions (Signed)

## 2023-10-19 NOTE — Interval H&P Note (Signed)
 History and Physical Interval Note:  10/19/2023 10:41 AM  Tracey Walton  has presented today for surgery, with the diagnosis of Pre-TAVR - aortic stenosis.  The various methods of treatment have been discussed with the patient and family. After consideration of risks, benefits and other options for treatment, the patient has consented to  Procedure(s): RIGHT/LEFT HEART CATH AND CORONARY ANGIOGRAPHY (N/A) as a surgical intervention.  The patient's history has been reviewed, patient examined, no change in status, stable for surgery.  I have reviewed the patient's chart and labs.  Questions were answered to the patient's satisfaction.     Elmin Wiederholt K Mayrene Bastarache

## 2023-10-20 ENCOUNTER — Encounter (HOSPITAL_COMMUNITY): Payer: Self-pay | Admitting: Internal Medicine

## 2023-10-25 ENCOUNTER — Ambulatory Visit (HOSPITAL_COMMUNITY)
Admission: RE | Admit: 2023-10-25 | Discharge: 2023-10-25 | Disposition: A | Discharge status: Home / Self Care | Source: Ambulatory Visit | Attending: Cardiovascular Disease | Admitting: Cardiovascular Disease

## 2023-10-25 DIAGNOSIS — K8020 Calculus of gallbladder without cholecystitis without obstruction: Secondary | ICD-10-CM | POA: Diagnosis not present

## 2023-10-25 DIAGNOSIS — I281 Aneurysm of pulmonary artery: Secondary | ICD-10-CM | POA: Diagnosis not present

## 2023-10-25 DIAGNOSIS — K5730 Diverticulosis of large intestine without perforation or abscess without bleeding: Secondary | ICD-10-CM | POA: Diagnosis not present

## 2023-10-25 DIAGNOSIS — I358 Other nonrheumatic aortic valve disorders: Secondary | ICD-10-CM | POA: Diagnosis not present

## 2023-10-25 DIAGNOSIS — Z01810 Encounter for preprocedural cardiovascular examination: Secondary | ICD-10-CM | POA: Diagnosis not present

## 2023-10-25 DIAGNOSIS — K449 Diaphragmatic hernia without obstruction or gangrene: Secondary | ICD-10-CM | POA: Diagnosis not present

## 2023-10-25 DIAGNOSIS — I350 Nonrheumatic aortic (valve) stenosis: Secondary | ICD-10-CM | POA: Insufficient documentation

## 2023-10-25 DIAGNOSIS — I2510 Atherosclerotic heart disease of native coronary artery without angina pectoris: Secondary | ICD-10-CM | POA: Diagnosis not present

## 2023-10-25 MED ORDER — IOHEXOL 350 MG/ML SOLN
100.0000 mL | Freq: Once | INTRAVENOUS | Status: AC | PRN
  Administered 2023-10-25: 100 mL via INTRAVENOUS

## 2023-10-29 ENCOUNTER — Ambulatory Visit: Payer: Self-pay | Admitting: Internal Medicine

## 2023-10-31 ENCOUNTER — Telehealth: Payer: Self-pay | Admitting: Internal Medicine

## 2023-10-31 NOTE — Telephone Encounter (Signed)
 Overread shows Left Upper Lobe nodule if no comparison needs f/u Discussed with Dr Wendel forward findings to PCP   Report faxed to PCP  Pt's daughter aware of recommendations ./cy

## 2023-10-31 NOTE — Telephone Encounter (Signed)
 Over read on CT

## 2023-10-31 NOTE — Progress Notes (Addendum)
 Procedure Type: Isolated AVR Perioperative Outcome Estimate % Operative Mortality 3.57% Morbidity & Mortality 7.61% Stroke 1.19% Renal Failure 1.72% Reoperation 2.47% Prolonged Ventilation 4.28% Deep Sternal Wound Infection 0.057% Long Hospital Stay (>14 days) 4.08% Short Hospital Stay (<6 days)* 37.5%

## 2023-11-09 DIAGNOSIS — M5412 Radiculopathy, cervical region: Secondary | ICD-10-CM | POA: Diagnosis not present

## 2023-11-09 DIAGNOSIS — M48062 Spinal stenosis, lumbar region with neurogenic claudication: Secondary | ICD-10-CM | POA: Diagnosis not present

## 2023-11-21 ENCOUNTER — Ambulatory Visit: Attending: Surgery | Admitting: Surgery

## 2023-11-21 ENCOUNTER — Encounter: Payer: Self-pay | Admitting: Surgery

## 2023-11-21 VITALS — BP 164/77 | HR 78 | Resp 18 | Ht 64.0 in | Wt 185.0 lb

## 2023-11-21 DIAGNOSIS — I350 Nonrheumatic aortic (valve) stenosis: Secondary | ICD-10-CM

## 2023-11-21 NOTE — Progress Notes (Unsigned)
 Patient ID: Tracey Walton, female   DOB: 07/11/1940, 83 y.o.   MRN: [id number]  [facility]       [address]       [name] [address]             [phone]          CARDIOTHORACIC SURGERY CONSULTATION REPORT  PCP is Chandra Toribio POUR, MD Referring Provider is Lurena Red, MD Primary Cardiologist is Aleene Passe, MD (Inactive)  Reason for consultation:  Severe aortic stenosis  HPI:  The patient is an 83 year old woman with history of hypertension, hyperlipidemia, stage IIIa chronic kidney disease, osteoarthritis, and severe aortic stenosis who was referred for consideration of TAVR.  She has been followed by Dr. Passe and had an echo in June 2024 showing a mean aortic valve gradient of 22.4 mmHg.  She now presents with worsening exertional shortness of breath and fatigue as well as exertional substernal chest tightness and episodes of dizziness.  She has had orthopnea and sleeps in a recliner.  She has had some lower extremity edema.  She has undergone knee and shoulder replacements and uses a walker for help with mobility but is still takes care of her grandchildren.  Her most recent echo in June 2025 showed a calcified aortic valve with a mean gradient of 46 mmHg and a valve area of 1.44 cm by VTI.  Left ventricular ejection fraction is 60 to 65% with grade 1 diastolic dysfunction.  She lives with her daughter. Past Medical History:  Diagnosis Date   Anxiety    Arthritis    qwhere (08/09/2017)   Chest pain 2011   negative for MI   Chronic kidney disease    renal insufficiency. 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 down after her knee surgery back in 2008   Chronic lower back pain    Depression    Dyspnea    SOB while walking   GERD (gastroesophageal reflux disease)    Heart murmur    History of hiatal hernia    Hyperlipidemia    Hypertension    Mild aortic stenosis    Osteoporosis    Pneumonia ~ 2014    Polymyalgia (HCC)    Strep throat    Vertigo     Past Surgical History:  Procedure Laterality Date   ABDOMINAL HYSTERECTOMY     partial   BILATERAL CARPAL TUNNEL RELEASE  2003-2005   right-left   CATARACT EXTRACTION W/ INTRAOCULAR LENS  IMPLANT, BILATERAL Bilateral    JOINT REPLACEMENT     REVERSE SHOULDER ARTHROPLASTY Left 10/12/2016   Procedure: REVERSE LEFT SHOULDER ARTHROPLASTY;  Surgeon: Melita Drivers, MD;  Location: [facility] OR;  Service: Orthopedics;  Laterality: Left;   REVERSE SHOULDER ARTHROPLASTY Right 08/09/2017   Procedure: RIGHT REVERSE SHOULDER ARTHROPLASTY;  Surgeon: Melita Drivers, MD;  Location: [facility] OR;  Service: Orthopedics;  Laterality: Right;   RIGHT HEART CATH AND CORONARY ANGIOGRAPHY N/A 10/19/2023   Procedure: RIGHT HEART CATH AND CORONARY ANGIOGRAPHY;  Surgeon: Red Lurena POUR, MD;  Location: [facility] INVASIVE CV LAB;  Service: Cardiovascular;  Laterality: N/A;   SHOULDER ARTHROSCOPY Right 2003   TOOTH EXTRACTION     1 tooth (08/09/2017)   TOTAL KNEE ARTHROPLASTY Bilateral     Family History  Problem Relation Age of Onset   Heart attack Mother    Heart disease Mother    Hypertension Father    Heart failure Father  Kidney failure Father    Hyperlipidemia Father    Stroke Father    Hypertension Sister    Hyperlipidemia Sister     Social History   Socioeconomic History   Marital status: Widowed    Spouse name: Not on file   Number of children: 3   Years of education: Not on file   Highest education level: 10th grade  Occupational History   Not on file  Tobacco Use   Smoking status: Never    Passive exposure: Never   Smokeless tobacco: Never  Vaping Use   Vaping status: Never Used  Substance and Sexual Activity   Alcohol use: Never   Drug use: Never   Sexual activity: Not Currently    Birth control/protection: None  Other Topics Concern   Not on file  Social History Narrative   Lives with daughter   caffeine not much at all   Social Drivers of  Health   Financial Resource Strain: Low Risk  (09/04/2023)   Overall Financial Resource Strain (CARDIA)    Difficulty of Paying Living Expenses: Not hard at all  Food Insecurity: No Food Insecurity (09/04/2023)   Hunger Vital Sign    Worried About Running Out of Food in the Last Year: Never true    Ran Out of Food in the Last Year: Never true  Transportation Needs: No Transportation Needs (09/04/2023)   PRAPARE - Administrator, Civil Service (Medical): No    Lack of Transportation (Non-Medical): No  Physical Activity: Inactive (09/04/2023)   Exercise Vital Sign    Days of Exercise per Week: 1 day    Minutes of Exercise per Session: 0 min  Stress: No Stress Concern Present (09/04/2023)   [employer] of Occupational Health - Occupational Stress Questionnaire    Feeling of Stress : Only a little  Social Connections: Socially Isolated (09/04/2023)   Social Connection and Isolation Panel    Frequency of Communication with Friends and Family: More than three times a week    Frequency of Social Gatherings with Friends and Family: More than three times a week    Attends Religious Services: Never    Database administrator or Organizations: No    Attends Banker Meetings: Never    Marital Status: Widowed  Intimate Partner Violence: Not At Risk (05/03/2023)   Humiliation, Afraid, Rape, and Kick questionnaire    Fear of Current or Ex-Partner: No    Emotionally Abused: No    Physically Abused: No    Sexually Abused: No    Prior to Admission medications   Medication Sig Start Date End Date Taking? Authorizing Provider  acetaminophen  (TYLENOL ) 325 MG tablet Take 650 mg by mouth 4 (four) times daily as needed (for pain.).   Yes [provider]  amLODipine  (NORVASC ) 5 MG tablet TAKE 1 TABLET BY MOUTH DAILY 08/29/23  Yes Nahser, Aleene PARAS, MD  baclofen (LIORESAL) 10 MG tablet Take 10 mg by mouth daily as needed for muscle spasms. 08/09/23  Yes [provider]   carvedilol  (COREG ) 6.25 MG tablet TAKE 1 TABLET BY MOUTH TWICE  DAILY 08/03/23  Yes Nahser, Aleene PARAS, MD  cetirizine (ZYRTEC) 10 MG tablet Take 10 mg by mouth daily.   Yes [provider]  coal tar-salicylic acid  2 % shampoo Apply topically daily as needed for itching. 01/26/23  Yes Wallace Joesph LABOR, PA  ferrous sulfate  325 (65 FE) MG tablet Take 1 tablet (325 mg total) by mouth daily  with breakfast. Take on Monday, Wednesday, Fridays 03/14/18  Yes Danford, Rockie D, NP  FLUoxetine  (PROZAC ) 20 MG capsule Take 1 capsule (20 mg total) by mouth daily. 01/26/23  Yes Wallace Search A, PA  fluticasone  (FLONASE ) 50 MCG/ACT nasal spray Place 2 sprays into both nostrils daily. 01/26/23  Yes Wallace Search A, PA  HYDROcodone-acetaminophen  (NORCO/VICODIN) 5-325 MG tablet Take 2 tablets by mouth 3 (three) times daily as needed for moderate pain (pain score 4-6). 02/15/23  Yes [provider]  naproxen  (NAPROSYN ) 500 MG tablet TAKE 1 TABLET BY MOUTH TWICE  DAILY WITH MEALS Patient taking differently: Take 500 mg by mouth daily. 12/21/21  Yes Abonza, Maritza, PA-C  omeprazole  (PRILOSEC) 20 MG capsule TAKE 1 CAPSULE BY MOUTH DAILY 05/21/23  Yes Wallace Search A, PA  OVER THE COUNTER MEDICATION Take 0.5 tablets by mouth daily as needed (Nausea). Antiemetic   Yes [provider]  Polyethyl Glycol-Propyl Glycol 0.4-0.3 % SOLN Place 1 drop into both eyes 3 (three) times daily as needed (for dry/irritated eyes.).   Yes [provider]  rosuvastatin  (CRESTOR ) 20 MG tablet TAKE 1 TABLET BY MOUTH DAILY 08/29/23  Yes Nahser, Aleene PARAS, MD  triamcinolone  cream (KENALOG ) 0.1 % Apply 1 Application topically 2 (two) times daily. 01/26/23  Yes Wallace Search A, PA  trimethoprim  (TRIMPEX ) 100 MG tablet Take 100 mg by mouth daily. At bed time   Yes [provider]  valsartan  (DIOVAN ) 40 MG tablet TAKE ONE-HALF TABLET BY MOUTH  DAILY 09/06/23  Yes Chandra Toribio POUR, MD    Current Outpatient  Medications  Medication Sig Dispense Refill   acetaminophen  (TYLENOL ) 325 MG tablet Take 650 mg by mouth 4 (four) times daily as needed (for pain.).     amLODipine  (NORVASC ) 5 MG tablet TAKE 1 TABLET BY MOUTH DAILY 90 tablet 3   baclofen (LIORESAL) 10 MG tablet Take 10 mg by mouth daily as needed for muscle spasms.     carvedilol  (COREG ) 6.25 MG tablet TAKE 1 TABLET BY MOUTH TWICE  DAILY 180 tablet 3   cetirizine (ZYRTEC) 10 MG tablet Take 10 mg by mouth daily.     coal tar-salicylic acid  2 % shampoo Apply topically daily as needed for itching. 236 mL 3   ferrous sulfate  325 (65 FE) MG tablet Take 1 tablet (325 mg total) by mouth daily with breakfast. Take on Monday, Wednesday, Fridays 90 tablet 1   FLUoxetine  (PROZAC ) 20 MG capsule Take 1 capsule (20 mg total) by mouth daily. 90 capsule 3   fluticasone  (FLONASE ) 50 MCG/ACT nasal spray Place 2 sprays into both nostrils daily. 48 g 2   HYDROcodone-acetaminophen  (NORCO/VICODIN) 5-325 MG tablet Take 2 tablets by mouth 3 (three) times daily as needed for moderate pain (pain score 4-6).     naproxen  (NAPROSYN ) 500 MG tablet TAKE 1 TABLET BY MOUTH TWICE  DAILY WITH MEALS (Patient taking differently: Take 500 mg by mouth daily.) 180 tablet 3   omeprazole  (PRILOSEC) 20 MG capsule TAKE 1 CAPSULE BY MOUTH DAILY 100 capsule 2   OVER THE COUNTER MEDICATION Take 0.5 tablets by mouth daily as needed (Nausea). Antiemetic     Polyethyl Glycol-Propyl Glycol 0.4-0.3 % SOLN Place 1 drop into both eyes 3 (three) times daily as needed (for dry/irritated eyes.).     rosuvastatin  (CRESTOR ) 20 MG tablet TAKE 1 TABLET BY MOUTH DAILY 90 tablet 3   triamcinolone  cream (KENALOG ) 0.1 % Apply 1 Application topically 2 (two) times daily. 80 g 3  trimethoprim  (TRIMPEX ) 100 MG tablet Take 100 mg by mouth daily. At bed time     valsartan  (DIOVAN ) 40 MG tablet TAKE ONE-HALF TABLET BY MOUTH  DAILY 50 tablet 2   No current facility-administered medications for this visit.     Allergies  Allergen Reactions   Penicillins Rash    Pt tolerated 2g ancef  without issue 10/12/16      Review of Systems:   General:  normal appetite, + decreased energy, no weight gain, no weight loss, no fever  Cardiac:  + chest pain with exertion, + chest pain at rest, +SOB with minimal exertion, + resting SOB, no PND, + orthopnea, +palpitations, + arrhythmia, no atrial fibrillation, + LE edema, + dizzy spells, no syncope  Respiratory:  +  shortness of breath, no home oxygen, no productive cough, no dry cough, no bronchitis, + wheezing, no hemoptysis, no asthma, no pain with inspiration or cough, no sleep apnea, no CPAP at night  GI:   no difficulty swallowing, + reflux, no frequent heartburn, + hiatal hernia, no abdominal pain, + constipation, no diarrhea, no hematochezia, no hematemesis, no melena  GU:   no dysuria,  no frequency, + urinary tract infection, no hematuria,  no kidney stones, + chronic kidney disease  Vascular:  no pain suggestive of claudication, no pain in feet, + leg cramps, no varicose veins, no DVT, no non-healing foot ulcer  Neuro:   no stroke, no TIA's, no seizures, + headaches, no temporary blindness one eye,  no slurred speech, + peripheral neuropathy, + chronic pain, + instability of gait, no memory/cognitive dysfunction  Musculoskeletal: + arthritis, no joint swelling, no myalgias, + difficulty walking, + reduced mobility   Skin:   no rash, no itching, no skin infections, no pressure sores or ulcerations  Psych:   + anxiety, + depression, no nervousness, no unusual recent stress  Eyes:   no blurry vision, no floaters, no recent vision changes, + wears glasses   ENT:   no hearing loss, no loose or painful teeth, no dentures, last saw dentist last year  Hematologic:  + easy bruising, no abnormal bleeding, no clotting disorder, no frequent epistaxis  Endocrine:  no diabetes, does not check CBG's at home     Physical Exam:   BP (!) 164/77   Pulse 78   Resp  18   Ht 5' 4 (1.626 m)   Wt 185 lb (83.9 kg)   SpO2 90%   BMI 31.76 kg/m   General:  Elderly,  well-appearing  HEENT:  Unremarkable, NCAT, PERLA, EOMI  Neck:   no JVD, no bruits, no adenopathy   Chest:   clear to auscultation, symmetrical breath sounds, no wheezes, no rhonchi   CV:   RRR, 3/6 systolic murmur RSB, no diastolic murmur  Abdomen:  soft, non-tender, no masses   Extremities:  warm, well-perfused, pedal pulses palpable, + moderate lower extremity edema  Rectal/GU  Deferred  Neuro:   Grossly non-focal and symmetrical throughout  Skin:   Clean and dry, no rashes, no breakdown  Diagnostic Tests:  ECHOCARDIOGRAM REPORT       Patient Name:   ASBERRY LULLA EAGLES Date of Exam: 09/06/2023  Medical Rec #:  [id number]        Height:       63.0 in  Accession #:    [id number]       Weight:       196.1 lb  Date of Birth:  [birth year]  BSA:          1.918 m  Patient Age:    82 years         BP:           158/85 mmHg  Patient Gender: F                HR:           89 bpm.  Exam Location:  [address]   Procedure: 2D Echo, Cardiac Doppler and Color Doppler (Both Spectral and  Color            Flow Doppler were utilized during procedure).   Indications:    I35.0 Aortic Stenosis    History:        Patient has prior history of Echocardiogram examinations,  most                 recent 09/05/2022. Signs/Symptoms:Murmur; Risk                  Factors:Hypertension and Dyslipidemia.    Sonographer:    Carl Rodgers-Jones RDCS  Referring Phys: [id number] PHILIP J NAHSER   IMPRESSIONS     1. Left ventricular ejection fraction, by estimation, is 60 to 65%. The  left ventricle has normal function. The left ventricle has no regional  wall motion abnormalities. Left ventricular diastolic parameters are  consistent with Grade I diastolic  dysfunction (impaired relaxation).   2. Right ventricular systolic function is normal. The right ventricular  size is normal. There is normal  pulmonary artery systolic pressure.   3. Left atrial size was moderately dilated.   4. The mitral valve is normal in structure. Trivial mitral valve  regurgitation. No evidence of mitral stenosis.   5. The aortic valve is normal in structure. Aortic valve regurgitation is  not visualized. Severe aortic valve stenosis. Aortic regurgitation PHT  measures 395 msec. Aortic valve area, by VTI measures 1.44 cm. Aortic  valve mean gradient measures 46.0  mmHg. Aortic valve Vmax measures 4.03 m/s.   6. The inferior vena cava is normal in size with <50% respiratory  variability, suggesting right atrial pressure of 8 mmHg.   FINDINGS   Left Ventricle: Left ventricular ejection fraction, by estimation, is 60  to 65%. The left ventricle has normal function. The left ventricle has no  regional wall motion abnormalities. The left ventricular internal cavity  size was normal in size. There is   no left ventricular hypertrophy. Left ventricular diastolic parameters  are consistent with Grade I diastolic dysfunction (impaired relaxation).   Right Ventricle: The right ventricular size is normal. No increase in  right ventricular wall thickness. Right ventricular systolic function is  normal. There is normal pulmonary artery systolic pressure. The tricuspid  regurgitant velocity is 2.48 m/s, and   with an assumed right atrial pressure of 8 mmHg, the estimated right  ventricular systolic pressure is 32.6 mmHg.   Left Atrium: Left atrial size was moderately dilated.   Right Atrium: Right atrial size was normal in size.   Pericardium: There is no evidence of pericardial effusion.   Mitral Valve: The mitral valve is normal in structure. Trivial mitral  valve regurgitation. No evidence of mitral valve stenosis.   Tricuspid Valve: The tricuspid valve is normal in structure. Tricuspid  valve regurgitation is trivial. No evidence of tricuspid stenosis.   The aortic valve is normal in structure. Aortic  valve regurgitation is not  visualized. Severe aortic stenosis is present.  Pulmonic Valve: The pulmonic valve was normal in structure. Pulmonic valve  regurgitation is not visualized. No evidence of pulmonic stenosis.   Aorta: The aortic root is normal in size and structure.   Venous: The inferior vena cava is normal in size with less than 50%  respiratory variability, suggesting right atrial pressure of 8 mmHg.   IAS/Shunts: No atrial level shunt detected by color flow Doppler.     LEFT VENTRICLE  PLAX 2D  LVIDd:         4.20 cm   Diastology  LVIDs:         2.40 cm   LV e' medial:    6.96 cm/s  LV PW:         0.80 cm   LV E/e' medial:  13.8  LV IVS:        0.90 cm   LV e' lateral:   8.71 cm/s  LVOT diam:     2.00 cm   LV E/e' lateral: 11.0  LV SV:         121  LV SV Index:   63  LVOT Area:     3.14 cm     RIGHT VENTRICLE             IVC  RV Basal diam:  4.30 cm     IVC diam: 1.30 cm  RV S prime:     14.37 cm/s  TAPSE (M-mode): 2.8 cm   LEFT ATRIUM             Index        RIGHT ATRIUM           Index  LA diam:        3.40 cm 1.77 cm/m   RA Area:     13.20 cm  LA Vol (A2C):   50.2 ml 26.18 ml/m  RA Volume:   35.60 ml  18.56 ml/m  LA Vol (A4C):   77.2 ml 40.26 ml/m  LA Biplane Vol: 67.4 ml 35.15 ml/m   AORTIC VALVE  AV Area (Vmax):    1.43 cm  AV Area (Vmean):   1.43 cm  AV Area (VTI):     1.44 cm  AV Vmax:           403.00 cm/s  AV Vmean:          278.800 cm/s  AV VTI:            0.846 m  AV Peak Grad:      65.0 mmHg  AV Mean Grad:      46.0 mmHg  LVOT Vmax:         183.00 cm/s  LVOT Vmean:        127.333 cm/s  LVOT VTI:          0.387 m  LVOT/AV VTI ratio: 0.46  AI PHT:            395 msec    AORTA  Ao Root diam: 3.20 cm  Ao Asc diam:  3.40 cm   MITRAL VALVE                TRICUSPID VALVE  MV Area (PHT): 4.06 cm     TR Peak grad:   24.6 mmHg  MV Decel Time: 187 msec     TR Vmax:        248.00 cm/s  MV E velocity: 96.10 cm/s  MV A velocity: 101.87  cm/s  SHUNTS  MV E/A  ratio:  0.94         Systemic VTI:  0.39 m                              Systemic Diam: 2.00 cm   Annabella Scarce MD  Electronically signed by Annabella Scarce MD  Signature Date/Time: 09/06/2023/3:14:08 PM        Final     Physicians  Panel Physicians Referring Physician Case Authorizing Physician  Thukkani, Arun K, MD (Primary)     Procedures  RIGHT HEART CATH AND CORONARY ANGIOGRAPHY   Conclusion      1st Diag lesion is 60% stenosed.   1.  Likely  right radial loop; sheath was never placed and manual pressure applied to the site after an attempt at access; hemostasis was obtained.  A left radial approach was pursued. 2.  Single-vessel ostial diagonal disease which will be treated medically. 3.  Fick cardiac output of 5.7 L/min and Fick cardiac index of 3.0 L/min/m with the following hemodynamics:            RA mean of 6 mmHg            RV 44/-3 with an end-diastolic pressure of 8 mmHg            Wedge pressure mean of 20 mmHg with V waves to 32 mmHg            PA pressures of 41/18 with a mean of 30 mmHg            PVR of 1.75 Woods units            PA pulsatility index of 3.8 4.  Capacious iliofemoral vessels bilaterally.  Exam aided by fluoroscopic markers indicates that a femoral approach for TAVR is feasible.   Recommendation: Continue evaluation for aortic valve intervention.     Indications  Nonrheumatic aortic valve stenosis [I35.0 (ICD-10-CM)]   Procedural Details  Technical Details The patient is 83 year old female with a history of severe symptomatic aortic stenosis, hypertension, hyperlipidemia, and CKD stage IIIa was seen in the outpatient setting due to dyspnea.  She is referred for right heart catheterization and coronary angiography study as part of her evaluation.  After obtaining consent, the patient brought to the cardiac catheterization laboratory and prepped hip sterile fashion.  Xylocaine  was used to anesthetize the right  wrist but we were unable to place a triple glide sheath.  Manual pressure was applied for 10 minutes to the site which was stable.  Xylocaine  was then used to anesthetize the left wrist and a 6 French femoral glide sheath was placed.  5000 units heparin  5 mg verapamil  were administered through the sheath.  Xylocaine  was used to anesthetize the area around a previous placed right antecubital IV and a 5 French Terumo glide sheath was placed.  Standard 6 [place] JR4 catheters were used for selective coronary angiography.  A 5 French balloontipped catheter was used for right heart catheterization.  A 6 French pigtail was maneuvered to the distal abdominal aorta and abdominal aortography and bilateral iliofemoral angiography was pursued.  After review of the angiographic images and hemodynamic data, no further interventions were pursued.  A TR band was placed on the left radial site and manual pressure applied to the right antecubital site.  There were no acute complications. Estimated blood loss <50 mL.   During this procedure medications were administered to achieve and maintain moderate  conscious sedation while the patient's heart rate, blood pressure, and oxygen saturation were continuously monitored and I was present face-to-face 100% of this time. Nicole Zafiris Cardiovascular Specialist and Joesph Sauers Cardiovascular Specialist  Corean Sar Cardiovascular Specialist are independent, trained observers who assisted in the monitoring of the patient's level of consciousness.   Medications (Filter: Administrations occurring from 1211 to 1331 on 10/19/23) Heparin  (Porcine) in NaCl 1000-0.9 UT/500ML-% SOLN (mL)  Total volume: 1,000 mL Date/Time Rate/Dose/Volume Action   10/19/23 1227 500 mL Given   1228 500 mL Given   midazolam  (VERSED ) injection (mg)  Total dose: 1 mg Date/Time Rate/Dose/Volume Action   10/19/23 1231 1 mg Given   fentaNYL  (SUBLIMAZE ) injection (mcg)  Total dose: 25  mcg Date/Time Rate/Dose/Volume Action   10/19/23 1231 25 mcg Given   lidocaine  (PF) (XYLOCAINE ) 1 % injection (mL)  Total volume: 5 mL Date/Time Rate/Dose/Volume Action   10/19/23 1237 2 mL Given   1237 1 mL Given   1249 2 mL Given   heparin  sodium (porcine) injection (Units)  Total dose: 5,000 Units Date/Time Rate/Dose/Volume Action   10/19/23 1251 5,000 Units Given   Radial Cocktail/Verapamil  only (mL)  Total volume: 5 mL Date/Time Rate/Dose/Volume Action   10/19/23 1251 5 mL Given   iohexol  (OMNIPAQUE ) 350 MG/ML injection (mL)  Total volume: 60 mL Date/Time Rate/Dose/Volume Action   10/19/23 1313 60 mL Given    Sedation Time  Sedation Time Physician-1: 39 minutes 51 seconds Contrast     Administrations occurring from 1211 to 1331 on 10/19/23:  Medication Name Total Dose  iohexol  (OMNIPAQUE ) 350 MG/ML injection 60 mL   Radiation/Fluoro  Fluoro time: 4.1 (min) DAP: 12820 (mGycm2) Cumulative Air Kerma: 156 (mGy) Complications  Complications documented before study signed (10/19/2023  1:37 PM)   No complications were associated with this study.  Documented by Sauers Joesph BRAVO, RCIS - 10/19/2023 12:03 PM     Coronary Findings  Diagnostic Dominance: Right Left Anterior Descending    First Diagonal Branch  1st Diag lesion is 60% stenosed.    Intervention   No interventions have been documented.   Coronary Diagrams  Diagnostic Dominance: Right  Intervention   Implants   No implant documentation for this case.   [employer] Images   Show images for CARDIAC CATHETERIZATION Images on Long Term Storage   Show images for [place], [place] Link to Procedure Log  Procedure Log    Hemodynamics  Pressures Phases Resting  Right     RA Mean  mmHg 6    RA A-Wave  mmHg 10    RA V-Wave  mmHg 8  Pulmonary     PA  mmHg 41/18 (30)    PCW Mean  mmHg 20.0    PCW A-Wave  mmHg 32.0    PCW V-Wave  mmHg 24.0    PAPi   3.8    Saturations Phases  Resting    PA  % 65    Arterial  % 90    Hemo Data  Flowsheet Row Most Recent Value  Fick Cardiac Output 5.67 L/min  Fick Cardiac Output Index 2.99 (L/min)/BSA  RA A Wave 10 mmHg  RA V Wave 8 mmHg  RA Mean 6 mmHg  RV Systolic Pressure 44 mmHg  RV Diastolic Pressure -3 mmHg  RV EDP 8 mmHg  PA Systolic Pressure 41 mmHg  PA Diastolic Pressure 18 mmHg  PA Mean 30 mmHg  PW A Wave 32 mmHg  PW V Wave 24 mmHg  PW Mean 20 mmHg  AO Systolic Pressure 160 mmHg  AO Diastolic Pressure 74 mmHg  AO Mean 112 mmHg  QP/QS 1.08  TPVR Index 9.28 HRUI  TSVR Index 37.41 HRUI  PVR SVR Ratio 0.09  TPVR/TSVR Ratio 0.25   Narrative & Impression  CLINICAL DATA:  Aortic valve replacement (TAVR), pre-op eval   EXAM: Cardiac TAVR CT   TECHNIQUE: A non-contrast, gated CT scan was obtained with axial slices of 2.5 mm through the heart for aortic valve scoring. A 120 kV retrospective, gated, contrast cardiac scan was obtained. Gantry rotation speed was 230 msec and collimation was 0.63 mm. Nitroglycerin was not given. A delayed scan was obtained to exclude left atrial appendage thrombus. The 3D dataset was reconstructed in systole with motion correction. The 3D data set was reconstructed in 5% intervals of the 0-95% of the R-R cycle. Systolic and diastolic phases were analyzed on a dedicated workstation using MPR, MIP, and VRT modes. The patient received 100 cc of contrast.   FINDINGS: Aortic Valve:   Tricuspid aortic valve with severely reduced cusp excursion. Severely thickened and severely calcified aortic valve cusps.   AV calcium  score: 1565   Virtual Basal Annulus Measurements:   Maximum/Minimum Diameter: 25.9 x 22.5 mm mm   Perimeter: 75.3 mm   Area:  435 mm2   No significant LVOT calcifications.   Membranous septal length: 7.5 mm   Based on these measurements, the annulus would be suitable for a 26 mm Sapien 3 valve, however annulus sizing is borderline between upper limit  of 23 mm and lower limit of 26 mm. Alternatively, Heart Team can consider 29mm Evolut valve. Recommend Heart Team discussion for valve selection.   Sinus of Valsalva Measurements:   Non-coronary:  33 mm   Right - coronary:  33 mm   Left - coronary:  32 mm   Coronary height and sinus of Valsalva Height:   Left main: 15.7 mm, Left sinus: 22.3 mm   Right coronary: 17 mm, Right sinus: 23 mm   Aorta: Variant branch pattern of aortic arch: Common origin of the brachiocephalic and left common carotid arteries.   Sinotubular Junction:  28 mm   Ascending Thoracic Aorta:  35 mm   Aortic Arch:  31 mm   Descending Thoracic Aorta:  27 mm   Coronary Arteries: Normal coronary origin. Right dominance. The study was performed without use of NTG and insufficient for plaque evaluation. Coronary artery calcium  seen in LM, LAD distribution.   Optimum Fluoroscopic Angle for Delivery: RAO 1 CRA 1   OTHER:   Atria: Dilated left atrium.   Left atrial appendage: No thrombus.   Mitral valve: Grossly normal, no mitral annular calcifications.   Pulmonary artery: Severely dilated main pulmonary artery, 44 mm, suggests pulmonary hypertension.   Pulmonary veins: Normal anatomy.   Very large hiatal hernia.   IMPRESSION: 1. Tricuspid aortic valve with severely reduced cusp excursion. Severely thickened and severely calcified aortic valve cusps. 2. Aortic valve calcium  score: 1565 3. Annulus area: 435 mm2, suitable for 26 mm Sapien 3 valve however valve sizing is borderline between 23 mm and 26 mm, recommend heart team discussion for valve sizing. No LVOT calcifications. Membranous septal length 7.5 mm. 4. Sufficient coronary artery heights from annulus. 5. Optimum fluoroscopic angle for delivery: RAO 1 CRA 1 6. Severely dilated main pulmonary artery, 44 mm, suggests pulmonary hypertension. 7. Very large esophageal hiatal hernia.   Electronically Signed: By: Soyla Merck M.D. On:  10/29/2023  07:55   Narrative & Impression  CLINICAL DATA:  Preoperative aortic valve replacement   EXAM: CT ANGIOGRAPHY CHEST, ABDOMEN AND PELVIS   TECHNIQUE: Multidetector CT imaging through the chest, abdomen and pelvis was performed using the standard protocol during bolus administration of intravenous contrast. Multiplanar reconstructed images and MIPs were obtained and reviewed to evaluate the vascular anatomy.   RADIATION DOSE REDUCTION: This exam was performed according to the departmental dose-optimization program which includes automated exposure control, adjustment of the mA and/or kV according to patient size and/or use of iterative reconstruction technique.   CONTRAST:  OMNIPAQUE  IOHEXOL  350 MG/ML SOLN   COMPARISON:  None Available.   FINDINGS: CTA CHEST FINDINGS   Cardiovascular: Mild cardiomegaly. No aortic dissection or aneurysm. Central pulmonary vasculature is mildly dilated. No central pulmonary arterial filling defects. Aortic and coronary artery atherosclerosis.   Mediastinum/Nodes: No axillary, mediastinal, or hilar lymphadenopathy. Multinodular thyroid  gland with largest nodule measuring 1.7 cm. Large hiatal hernia. Trachea within normal limits.   Lungs/Pleura: Mild bibasilar atelectasis. Lungs are otherwise clear. No pleural effusion or pneumothorax.   Musculoskeletal: Bilateral shoulder prostheses. No acute bony or chest wall abnormality.   Review of the MIP images confirms the above findings.   CTA ABDOMEN AND PELVIS FINDINGS   VASCULAR   Moderate-severe aortoiliac atherosclerosis without aneurysm. Negative for aortic dissection. Greater than 50% stenosis at the origins of the bilateral renal arteries secondary to calcified atherosclerotic plaque. No additional sites of significant stenosis.   Review of the MIP images confirms the above findings.   NON-VASCULAR   Hepatobiliary: No focal liver abnormality. Tiny layering stones  in the gallbladder. No pericholecystic inflammatory changes by CT. No ductal dilatation.   Pancreas: Unremarkable. No pancreatic ductal dilatation or surrounding inflammatory changes.   Spleen: Normal in size without focal abnormality.   Adrenals/Urinary Tract: Unremarkable adrenal glands. Bilateral renal cysts, requiring no follow-up imaging. No solid renal lesion, stone, or hydronephrosis. Urinary bladder is partially distended.   Stomach/Bowel: Large hiatal hernia containing the entirety of the stomach within the chest. No dilated loops of bowel. No bowel wall thickening or inflammatory changes. Extensive colonic diverticulosis.   Lymphatic: No abdominopelvic lymphadenopathy.   Reproductive: Status post hysterectomy. No adnexal masses.   Other: No free fluid. No abdominopelvic fluid collection. No pneumoperitoneum. No abdominal wall hernia.   Musculoskeletal: Degenerative lumbar spondylosis. Thoracolumbar scoliosis. No acute osseous abnormality.   Review of the MIP images confirms the above findings.   IMPRESSION: 1. No acute findings within the chest, abdomen, or pelvis. 2. Aortic atherosclerosis without aneurysm or dissection. 3. Greater than 50% stenosis at the origins of the bilateral renal arteries secondary to calcified atherosclerotic plaque. 4. Large hiatal hernia containing the entirety of the stomach within the chest. 5. Cholelithiasis without evidence of acute cholecystitis. 6. Extensive colonic diverticulosis without evidence of acute diverticulitis. 7. Multinodular thyroid  gland. Recommend thyroid  US  (ref: J Am Coll Radiol. 2015 Feb;12(2): 143-50). 8. Aortic and coronary artery atherosclerosis (ICD10-I70.0).     Electronically Signed   By: Mabel Converse D.O.   On: 11/02/2023 10:11      Impression:  This 83 year old woman has Stage D, severe, symptomatic aortic stenosis with NYHA class III symptoms of exertional fatigue and shortness of breath  associated with exertional substernal chest discomfort, orthopnea, and dizziness.  She has significant lower extremity edema.  I have personally reviewed her 2D echocardiogram, cardiac catheterization, and CTA studies.  Her echocardiogram shows a severely calcified aortic valve with restricted leaflet  mobility.  The mean gradient is 46 mmHg.  The valve area by VTI is 1.44 cm but I do not think that is accurate.  It looks like a severely stenotic valve.  Left ventricular ejection fraction is 60 to 65%.  Cardiac catheterization shows 60% stenosis in the first diagonal branch at the ostium but otherwise no significant disease.  This can be treated medically.  Given her age I agree that transcatheter aortic valve replacement be the best option for treating her.  Her gated cardiac CTA shows anatomy suitable for TAVR using a SAPIEN 3 valve.  Her annular dimensions are sitting between a 23 and 26 mm valve but the LVOT narrows to the mid 23 range.  Her abdominal and pelvic CTA shows adequate pelvic vascular anatomy to allow transfemoral insertion.  The patient was counseled at length regarding treatment alternatives for management of severe symptomatic aortic stenosis. The risks and benefits of surgical intervention has been discussed in detail. Long-term prognosis with medical therapy was discussed. Alternative approaches such as conventional surgical aortic valve replacement, transcatheter aortic valve replacement, and palliative medical therapy were compared and contrasted at length. This discussion was placed in the context of the patient's own specific clinical presentation and past medical history. All of her questions have been addressed.   Following the decision to proceed with transcatheter aortic valve replacement, a discussion was held regarding what types of management strategies would be attempted intraoperatively in the event of life-threatening complications, including whether or not the patient would be  considered a candidate for the use of cardiopulmonary bypass and/or conversion to open sternotomy for attempted surgical intervention.  Given her age, frailty, and comorbidities I do not think she is a candidate for emergent median sternotomy to manage any intraoperative complications.  The patient has been advised of a variety of complications that might develop including but not limited to risks of death, stroke, paravalvular leak, aortic dissection or other major vascular complications, aortic annulus rupture, device embolization, cardiac rupture or perforation, mitral regurgitation, acute myocardial infarction, arrhythmia, heart block or bradycardia requiring permanent pacemaker placement, congestive heart failure, respiratory failure, renal failure, pneumonia, infection, other late complications related to structural valve deterioration or migration, or other complications that might ultimately cause a temporary or permanent loss of functional independence or other long term morbidity. The patient provides full informed consent for the procedure as described and all questions were answered.      Plan:   She will be scheduled for transfemoral TAVR using a SAPIEN 3 valve in the near future.  I spent 60 minutes performing this consultation and > 50% of this time was spent face to face counseling and coordinating the care of this patient's severe symptomatic aortic stenosis.   Dorise LOIS Fellers, MD 11/21/2023

## 2023-11-21 NOTE — H&P (View-Only) (Signed)
 Patient ID: Tracey Walton, female   DOB: [birth year], 83 y.o.   MRN: [id number]  [facility]       [address]       [place] [address]             [phone]          CARDIOTHORACIC SURGERY CONSULTATION REPORT  PCP is Chandra Toribio POUR, MD Referring Provider is Lurena Red, MD Primary Cardiologist is Aleene Passe, MD (Inactive)  Reason for consultation:  Severe aortic stenosis  HPI:  The patient is an 83 year old woman with history of hypertension, hyperlipidemia, stage IIIa chronic kidney disease, osteoarthritis, and severe aortic stenosis who was referred for consideration of TAVR.  She has been followed by Dr. Passe and had an echo in June 2024 showing a mean aortic valve gradient of 22.4 mmHg.  She now presents with worsening exertional shortness of breath and fatigue as well as exertional substernal chest tightness and episodes of dizziness.  She has had orthopnea and sleeps in a recliner.  She has had some lower extremity edema.  She has undergone knee and shoulder replacements and uses a walker for help with mobility but is still takes care of her grandchildren.  Her most recent echo in June 2025 showed a calcified aortic valve with a mean gradient of 46 mmHg and a valve area of 1.44 cm by VTI.  Left ventricular ejection fraction is 60 to 65% with grade 1 diastolic dysfunction.  She lives with her daughter. Past Medical History:  Diagnosis Date   Anxiety    Arthritis    qwhere (08/09/2017)   Chest pain 2011   negative for MI   Chronic kidney disease    renal insufficiency. 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 down after her knee surgery back in 2008   Chronic lower back pain    Depression    Dyspnea    SOB while walking   GERD (gastroesophageal reflux disease)    Heart murmur    History of hiatal hernia    Hyperlipidemia    Hypertension    Mild aortic stenosis    Osteoporosis    Pneumonia ~ 2014    Polymyalgia (HCC)    Strep throat    Vertigo     Past Surgical History:  Procedure Laterality Date   ABDOMINAL HYSTERECTOMY     partial   BILATERAL CARPAL TUNNEL RELEASE  2003-2005   right-left   CATARACT EXTRACTION W/ INTRAOCULAR LENS  IMPLANT, BILATERAL Bilateral    JOINT REPLACEMENT     REVERSE SHOULDER ARTHROPLASTY Left 10/12/2016   Procedure: REVERSE LEFT SHOULDER ARTHROPLASTY;  Surgeon: Melita Drivers, MD;  Location: [facility] OR;  Service: Orthopedics;  Laterality: Left;   REVERSE SHOULDER ARTHROPLASTY Right 08/09/2017   Procedure: RIGHT REVERSE SHOULDER ARTHROPLASTY;  Surgeon: Melita Drivers, MD;  Location: [facility] OR;  Service: Orthopedics;  Laterality: Right;   RIGHT HEART CATH AND CORONARY ANGIOGRAPHY N/A 10/19/2023   Procedure: RIGHT HEART CATH AND CORONARY ANGIOGRAPHY;  Surgeon: Red Lurena POUR, MD;  Location: [facility] INVASIVE CV [facility];  Service: Cardiovascular;  Laterality: N/A;   SHOULDER ARTHROSCOPY Right 2003   TOOTH EXTRACTION     1 tooth (08/09/2017)   TOTAL KNEE ARTHROPLASTY Bilateral     Family History  Problem Relation Age of Onset   Heart attack Mother    Heart disease Mother    Hypertension Father    Heart failure Father  Kidney failure Father    Hyperlipidemia Father    Stroke Father    Hypertension Sister    Hyperlipidemia Sister     Social History   Socioeconomic History   Marital status: Widowed    Spouse name: Not on file   Number of children: 3   Years of education: Not on file   Highest education level: 10th grade  Occupational History   Not on file  Tobacco Use   Smoking status: Never    Passive exposure: Never   Smokeless tobacco: Never  Vaping Use   Vaping status: Never Used  Substance and Sexual Activity   Alcohol use: Never   Drug use: Never   Sexual activity: Not Currently    Birth control/protection: None  Other Topics Concern   Not on file  Social History Narrative   Lives with daughter   caffeine not much at all   Social Drivers of  Health   Financial Resource Strain: Low Risk  (09/04/2023)   Overall Financial Resource Strain (CARDIA)    Difficulty of Paying Living Expenses: Not hard at all  Food Insecurity: No Food Insecurity (09/04/2023)   Hunger Vital Sign    Worried About Running Out of Food in the Last Year: Never true    Ran Out of Food in the Last Year: Never true  Transportation Needs: No Transportation Needs (09/04/2023)   PRAPARE - Administrator, Civil Service (Medical): No    Lack of Transportation (Non-Medical): No  Physical Activity: Inactive (09/04/2023)   Exercise Vital Sign    Days of Exercise per Week: 1 day    Minutes of Exercise per Session: 0 min  Stress: No Stress Concern Present (09/04/2023)   [employer] of Occupational Health - Occupational Stress Questionnaire    Feeling of Stress : Only a little  Social Connections: Socially Isolated (09/04/2023)   Social Connection and Isolation Panel    Frequency of Communication with Friends and Family: More than three times a week    Frequency of Social Gatherings with Friends and Family: More than three times a week    Attends Religious Services: Never    Database administrator or Organizations: No    Attends Banker Meetings: Never    Marital Status: Widowed  Intimate Partner Violence: Not At Risk (05/03/2023)   Humiliation, Afraid, Rape, and Kick questionnaire    Fear of Current or Ex-Partner: No    Emotionally Abused: No    Physically Abused: No    Sexually Abused: No    Prior to Admission medications   Medication Sig Start Date End Date Taking? Authorizing Provider  acetaminophen  (TYLENOL ) 325 MG tablet Take 650 mg by mouth 4 (four) times daily as needed (for pain.).   Yes [provider]  amLODipine  (NORVASC ) 5 MG tablet TAKE 1 TABLET BY MOUTH DAILY 08/29/23  Yes Nahser, Aleene PARAS, MD  baclofen  (LIORESAL ) 10 MG tablet Take 10 mg by mouth daily as needed for muscle spasms. 08/09/23  Yes [provider]   carvedilol  (COREG ) 6.25 MG tablet TAKE 1 TABLET BY MOUTH TWICE  DAILY 08/03/23  Yes Nahser, Aleene PARAS, MD  cetirizine (ZYRTEC) 10 MG tablet Take 10 mg by mouth daily.   Yes [provider]  coal tar-salicylic acid  2 % shampoo Apply topically daily as needed for itching. 01/26/23  Yes Wallace Search A, PA  ferrous sulfate  325 (65 FE) MG tablet Take 1 tablet (325 mg total) by mouth daily  with breakfast. Take on Monday, Wednesday, Fridays 03/14/18  Yes Danford, Rockie D, NP  FLUoxetine  (PROZAC ) 20 MG capsule Take 1 capsule (20 mg total) by mouth daily. 01/26/23  Yes Wallace Search A, PA  fluticasone  (FLONASE ) 50 MCG/ACT nasal spray Place 2 sprays into both nostrils daily. 01/26/23  Yes Wallace Search LABOR, PA  HYDROcodone -acetaminophen  (NORCO/VICODIN) 5-325 MG tablet Take 2 tablets by mouth 3 (three) times daily as needed for moderate pain (pain score 4-6). 02/15/23  Yes [provider]  naproxen  (NAPROSYN ) 500 MG tablet TAKE 1 TABLET BY MOUTH TWICE  DAILY WITH MEALS Patient taking differently: Take 500 mg by mouth daily. 12/21/21  Yes Abonza, Maritza, PA-C  omeprazole  (PRILOSEC) 20 MG capsule TAKE 1 CAPSULE BY MOUTH DAILY 05/21/23  Yes Wallace Search A, PA  OVER THE COUNTER MEDICATION Take 0.5 tablets by mouth daily as needed (Nausea). Antiemetic   Yes [provider]  Polyethyl Glycol-Propyl Glycol 0.4-0.3 % SOLN Place 1 drop into both eyes 3 (three) times daily as needed (for dry/irritated eyes.).   Yes [provider]  rosuvastatin  (CRESTOR ) 20 MG tablet TAKE 1 TABLET BY MOUTH DAILY 08/29/23  Yes Nahser, Aleene PARAS, MD  triamcinolone  cream (KENALOG ) 0.1 % Apply 1 Application topically 2 (two) times daily. 01/26/23  Yes Wallace Search A, PA  trimethoprim  (TRIMPEX ) 100 MG tablet Take 100 mg by mouth daily. At bed time   Yes [provider]  valsartan  (DIOVAN ) 40 MG tablet TAKE ONE-HALF TABLET BY MOUTH  DAILY 09/06/23  Yes Chandra Toribio POUR, MD    Current Outpatient  Medications  Medication Sig Dispense Refill   acetaminophen  (TYLENOL ) 325 MG tablet Take 650 mg by mouth 4 (four) times daily as needed (for pain.).     amLODipine  (NORVASC ) 5 MG tablet TAKE 1 TABLET BY MOUTH DAILY 90 tablet 3   baclofen  (LIORESAL ) 10 MG tablet Take 10 mg by mouth daily as needed for muscle spasms.     carvedilol  (COREG ) 6.25 MG tablet TAKE 1 TABLET BY MOUTH TWICE  DAILY 180 tablet 3   cetirizine (ZYRTEC) 10 MG tablet Take 10 mg by mouth daily.     coal tar-salicylic acid  2 % shampoo Apply topically daily as needed for itching. 236 mL 3   ferrous sulfate  325 (65 FE) MG tablet Take 1 tablet (325 mg total) by mouth daily with breakfast. Take on Monday, Wednesday, Fridays 90 tablet 1   FLUoxetine  (PROZAC ) 20 MG capsule Take 1 capsule (20 mg total) by mouth daily. 90 capsule 3   fluticasone  (FLONASE ) 50 MCG/ACT nasal spray Place 2 sprays into both nostrils daily. 48 g 2   HYDROcodone -acetaminophen  (NORCO/VICODIN) 5-325 MG tablet Take 2 tablets by mouth 3 (three) times daily as needed for moderate pain (pain score 4-6).     naproxen  (NAPROSYN ) 500 MG tablet TAKE 1 TABLET BY MOUTH TWICE  DAILY WITH MEALS (Patient taking differently: Take 500 mg by mouth daily.) 180 tablet 3   omeprazole  (PRILOSEC) 20 MG capsule TAKE 1 CAPSULE BY MOUTH DAILY 100 capsule 2   OVER THE COUNTER MEDICATION Take 0.5 tablets by mouth daily as needed (Nausea). Antiemetic     Polyethyl Glycol-Propyl Glycol 0.4-0.3 % SOLN Place 1 drop into both eyes 3 (three) times daily as needed (for dry/irritated eyes.).     rosuvastatin  (CRESTOR ) 20 MG tablet TAKE 1 TABLET BY MOUTH DAILY 90 tablet 3   triamcinolone  cream (KENALOG ) 0.1 % Apply 1 Application topically 2 (two) times daily. 80 g 3  trimethoprim  (TRIMPEX ) 100 MG tablet Take 100 mg by mouth daily. At bed time     valsartan  (DIOVAN ) 40 MG tablet TAKE ONE-HALF TABLET BY MOUTH  DAILY 50 tablet 2   No current facility-administered medications for this visit.     Allergies  Allergen Reactions   Penicillins Rash    Pt tolerated 2g ancef  without issue 10/12/16      Review of Systems:   General:  normal appetite, + decreased energy, no weight gain, no weight loss, no fever  Cardiac:  + chest pain with exertion, + chest pain at rest, +SOB with minimal exertion, + resting SOB, no PND, + orthopnea, +palpitations, + arrhythmia, no atrial fibrillation, + LE edema, + dizzy spells, no syncope  Respiratory:  +  shortness of breath, no home oxygen, no productive cough, no dry cough, no bronchitis, + wheezing, no hemoptysis, no asthma, no pain with inspiration or cough, no sleep apnea, no CPAP at night  GI:   no difficulty swallowing, + reflux, no frequent heartburn, + hiatal hernia, no abdominal pain, + constipation, no diarrhea, no hematochezia, no hematemesis, no melena  GU:   no dysuria,  no frequency, + urinary tract infection, no hematuria,  no kidney stones, + chronic kidney disease  Vascular:  no pain suggestive of claudication, no pain in feet, + leg cramps, no varicose veins, no DVT, no non-healing foot ulcer  Neuro:   no stroke, no TIA's, no seizures, + headaches, no temporary blindness one eye,  no slurred speech, + peripheral neuropathy, + chronic pain, + instability of gait, no memory/cognitive dysfunction  Musculoskeletal: + arthritis, no joint swelling, no myalgias, + difficulty walking, + reduced mobility   Skin:   no rash, no itching, no skin infections, no pressure sores or ulcerations  Psych:   + anxiety, + depression, no nervousness, no unusual recent stress  Eyes:   no blurry vision, no floaters, no recent vision changes, + wears glasses   ENT:   no hearing loss, no loose or painful teeth, no dentures, last saw dentist last year  Hematologic:  + easy bruising, no abnormal bleeding, no clotting disorder, no frequent epistaxis  Endocrine:  no diabetes, does not check CBG's at home     Physical Exam:   BP (!) 164/77   Pulse 78   Resp  18   Ht 5' 4 (1.626 m)   Wt 185 lb (83.9 kg)   SpO2 90%   BMI 31.76 kg/m   General:  Elderly,  well-appearing  HEENT:  Unremarkable, NCAT, PERLA, EOMI  Neck:   no JVD, no bruits, no adenopathy   Chest:   clear to auscultation, symmetrical breath sounds, no wheezes, no rhonchi   CV:   RRR, 3/6 systolic murmur RSB, no diastolic murmur  Abdomen:  soft, non-tender, no masses   Extremities:  warm, well-perfused, pedal pulses palpable, + moderate lower extremity edema  Rectal/GU  Deferred  Neuro:   Grossly non-focal and symmetrical throughout  Skin:   Clean and dry, no rashes, no breakdown  Diagnostic Tests:  ECHOCARDIOGRAM REPORT       Patient Name:   ASBERRY LULLA EAGLES Date of Exam: 09/06/2023  Medical Rec #:  [id number]        Height:       63.0 in  Accession #:    [id number]       Weight:       196.1 lb  Date of Birth:  April 06, 1940  BSA:          1.918 m  Patient Age:    82 years         BP:           158/85 mmHg  Patient Gender: F                HR:           89 bpm.  Exam Location:  [address]   Procedure: 2D Echo, Cardiac Doppler and Color Doppler (Both Spectral and  Color            Flow Doppler were utilized during procedure).   Indications:    I35.0 Aortic Stenosis    History:        Patient has prior history of Echocardiogram examinations,  most                 recent 09/05/2022. Signs/Symptoms:Murmur; Risk                  Factors:Hypertension and Dyslipidemia.    Sonographer:    Carl Rodgers-Jones RDCS  Referring Phys: [id number] PHILIP J NAHSER   IMPRESSIONS     1. Left ventricular ejection fraction, by estimation, is 60 to 65%. The  left ventricle has normal function. The left ventricle has no regional  wall motion abnormalities. Left ventricular diastolic parameters are  consistent with Grade I diastolic  dysfunction (impaired relaxation).   2. Right ventricular systolic function is normal. The right ventricular  size is normal. There is normal  pulmonary artery systolic pressure.   3. Left atrial size was moderately dilated.   4. The mitral valve is normal in structure. Trivial mitral valve  regurgitation. No evidence of mitral stenosis.   5. The aortic valve is normal in structure. Aortic valve regurgitation is  not visualized. Severe aortic valve stenosis. Aortic regurgitation PHT  measures 395 msec. Aortic valve area, by VTI measures 1.44 cm. Aortic  valve mean gradient measures 46.0  mmHg. Aortic valve Vmax measures 4.03 m/s.   6. The inferior vena cava is normal in size with <50% respiratory  variability, suggesting right atrial pressure of 8 mmHg.   FINDINGS   Left Ventricle: Left ventricular ejection fraction, by estimation, is 60  to 65%. The left ventricle has normal function. The left ventricle has no  regional wall motion abnormalities. The left ventricular internal cavity  size was normal in size. There is   no left ventricular hypertrophy. Left ventricular diastolic parameters  are consistent with Grade I diastolic dysfunction (impaired relaxation).   Right Ventricle: The right ventricular size is normal. No increase in  right ventricular wall thickness. Right ventricular systolic function is  normal. There is normal pulmonary artery systolic pressure. The tricuspid  regurgitant velocity is 2.48 m/s, and   with an assumed right atrial pressure of 8 mmHg, the estimated right  ventricular systolic pressure is 32.6 mmHg.   Left Atrium: Left atrial size was moderately dilated.   Right Atrium: Right atrial size was normal in size.   Pericardium: There is no evidence of pericardial effusion.   Mitral Valve: The mitral valve is normal in structure. Trivial mitral  valve regurgitation. No evidence of mitral valve stenosis.   Tricuspid Valve: The tricuspid valve is normal in structure. Tricuspid  valve regurgitation is trivial. No evidence of tricuspid stenosis.   The aortic valve is normal in structure. Aortic  valve regurgitation is not  visualized. Severe aortic stenosis is present.  Pulmonic Valve: The pulmonic valve was normal in structure. Pulmonic valve  regurgitation is not visualized. No evidence of pulmonic stenosis.   Aorta: The aortic root is normal in size and structure.   Venous: The inferior vena cava is normal in size with less than 50%  respiratory variability, suggesting right atrial pressure of 8 mmHg.   IAS/Shunts: No atrial level shunt detected by color flow Doppler.     LEFT VENTRICLE  PLAX 2D  LVIDd:         4.20 cm   Diastology  LVIDs:         2.40 cm   LV e' medial:    6.96 cm/s  LV PW:         0.80 cm   LV E/e' medial:  13.8  LV IVS:        0.90 cm   LV e' lateral:   8.71 cm/s  LVOT diam:     2.00 cm   LV E/e' lateral: 11.0  LV SV:         121  LV SV Index:   63  LVOT Area:     3.14 cm     RIGHT VENTRICLE             IVC  RV Basal diam:  4.30 cm     IVC diam: 1.30 cm  RV S prime:     14.37 cm/s  TAPSE (M-mode): 2.8 cm   LEFT ATRIUM             Index        RIGHT ATRIUM           Index  LA diam:        3.40 cm 1.77 cm/m   RA Area:     13.20 cm  LA Vol (A2C):   50.2 ml 26.18 ml/m  RA Volume:   35.60 ml  18.56 ml/m  LA Vol (A4C):   77.2 ml 40.26 ml/m  LA Biplane Vol: 67.4 ml 35.15 ml/m   AORTIC VALVE  AV Area (Vmax):    1.43 cm  AV Area (Vmean):   1.43 cm  AV Area (VTI):     1.44 cm  AV Vmax:           403.00 cm/s  AV Vmean:          278.800 cm/s  AV VTI:            0.846 m  AV Peak Grad:      65.0 mmHg  AV Mean Grad:      46.0 mmHg  LVOT Vmax:         183.00 cm/s  LVOT Vmean:        127.333 cm/s  LVOT VTI:          0.387 m  LVOT/AV VTI ratio: 0.46  AI PHT:            395 msec    AORTA  Ao Root diam: 3.20 cm  Ao Asc diam:  3.40 cm   MITRAL VALVE                TRICUSPID VALVE  MV Area (PHT): 4.06 cm     TR Peak grad:   24.6 mmHg  MV Decel Time: 187 msec     TR Vmax:        248.00 cm/s  MV E velocity: 96.10 cm/s  MV A velocity: 101.87  cm/s  SHUNTS  MV E/A  ratio:  0.94         Systemic VTI:  0.39 m                              Systemic Diam: 2.00 cm   Annabella Scarce MD  Electronically signed by Annabella Scarce MD  Signature Date/Time: 09/06/2023/3:14:08 PM        Final     Physicians  Panel Physicians Referring Physician Case Authorizing Physician  Thukkani, Arun K, MD (Primary)     Procedures  RIGHT HEART CATH AND CORONARY ANGIOGRAPHY   Conclusion      1st Diag lesion is 60% stenosed.   1.  Likely  right radial loop; sheath was never placed and manual pressure applied to the site after an attempt at access; hemostasis was obtained.  A left radial approach was pursued. 2.  Single-vessel ostial diagonal disease which will be treated medically. 3.  Fick cardiac output of 5.7 L/min and Fick cardiac index of 3.0 L/min/m with the following hemodynamics:            RA mean of 6 mmHg            RV 44/-3 with an end-diastolic pressure of 8 mmHg            Wedge pressure mean of 20 mmHg with V waves to 32 mmHg            PA pressures of 41/18 with a mean of 30 mmHg            PVR of 1.75 Woods units            PA pulsatility index of 3.8 4.  Capacious iliofemoral vessels bilaterally.  Exam aided by fluoroscopic markers indicates that a femoral approach for TAVR is feasible.   Recommendation: Continue evaluation for aortic valve intervention.     Indications  Nonrheumatic aortic valve stenosis [I35.0 (ICD-10-CM)]   Procedural Details  Technical Details The patient is 83 year old female with a history of severe symptomatic aortic stenosis, hypertension, hyperlipidemia, and CKD stage IIIa was seen in the outpatient setting due to dyspnea.  She is referred for right heart catheterization and coronary angiography study as part of her evaluation.  After obtaining consent, the patient brought to the cardiac catheterization laboratory and prepped hip sterile fashion.  Xylocaine  was used to anesthetize the right  wrist but we were unable to place a triple glide sheath.  Manual pressure was applied for 10 minutes to the site which was stable.  Xylocaine  was then used to anesthetize the left wrist and a 6 French femoral glide sheath was placed.  5000 units heparin  5 mg verapamil  were administered through the sheath.  Xylocaine  was used to anesthetize the area around a previous placed right antecubital IV and a 5 French Terumo glide sheath was placed.  Standard 6 [place] JR4 catheters were used for selective coronary angiography.  A 5 French balloontipped catheter was used for right heart catheterization.  A 6 French pigtail was maneuvered to the distal abdominal aorta and abdominal aortography and bilateral iliofemoral angiography was pursued.  After review of the angiographic images and hemodynamic data, no further interventions were pursued.  A TR band was placed on the left radial site and manual pressure applied to the right antecubital site.  There were no acute complications. Estimated blood loss <50 mL.   During this procedure medications were administered to achieve and maintain moderate  conscious sedation while the patient's heart rate, blood pressure, and oxygen saturation were continuously monitored and I was present face-to-face 100% of this time. Nicole Zafiris Cardiovascular Specialist and Joesph Sauers Cardiovascular Specialist  Corean Sar Cardiovascular Specialist are independent, trained observers who assisted in the monitoring of the patient's level of consciousness.   Medications (Filter: Administrations occurring from 1211 to 1331 on 10/19/23) Heparin  (Porcine) in NaCl 1000-0.9 UT/500ML-% SOLN (mL)  Total volume: 1,000 mL Date/Time Rate/Dose/Volume Action   10/19/23 1227 500 mL Given   1228 500 mL Given   midazolam  (VERSED ) injection (mg)  Total dose: 1 mg Date/Time Rate/Dose/Volume Action   10/19/23 1231 1 mg Given   fentaNYL  (SUBLIMAZE ) injection (mcg)  Total dose: 25  mcg Date/Time Rate/Dose/Volume Action   10/19/23 1231 25 mcg Given   lidocaine  (PF) (XYLOCAINE ) 1 % injection (mL)  Total volume: 5 mL Date/Time Rate/Dose/Volume Action   10/19/23 1237 2 mL Given   1237 1 mL Given   1249 2 mL Given   heparin  sodium (porcine) injection (Units)  Total dose: 5,000 Units Date/Time Rate/Dose/Volume Action   10/19/23 1251 5,000 Units Given   Radial Cocktail/Verapamil  only (mL)  Total volume: 5 mL Date/Time Rate/Dose/Volume Action   10/19/23 1251 5 mL Given   iohexol  (OMNIPAQUE ) 350 MG/ML injection (mL)  Total volume: 60 mL Date/Time Rate/Dose/Volume Action   10/19/23 1313 60 mL Given    Sedation Time  Sedation Time Physician-1: 39 minutes 51 seconds Contrast     Administrations occurring from 1211 to 1331 on 10/19/23:  Medication Name Total Dose  iohexol  (OMNIPAQUE ) 350 MG/ML injection 60 mL   Radiation/Fluoro  Fluoro time: 4.1 (min) DAP: 12820 (mGycm2) Cumulative Air Kerma: 156 (mGy) Complications  Complications documented before study signed (10/19/2023  1:37 PM)   No complications were associated with this study.  Documented by Sauers Joesph BRAVO, RCIS - 10/19/2023 12:03 PM     Coronary Findings  Diagnostic Dominance: Right Left Anterior Descending    First Diagonal Branch  1st Diag lesion is 60% stenosed.    Intervention   No interventions have been documented.   Coronary Diagrams  Diagnostic Dominance: Right  Intervention   Implants   No implant documentation for this case.   [employer]ng[employer] Images   Show images for CARDIAC CATHETERIZATION Images on Long Term Storage   Show images for Sindi, Beckworth Link to Procedure Log  Procedure Log    Hemodynamics  Pressures Phases Resting  Right     RA Mean  mmHg 6    RA A-Wave  mmHg 10    RA V-Wave  mmHg 8  Pulmonary     PA  mmHg 41/18 (30)    PCW Mean  mmHg 20.0    PCW A-Wave  mmHg 32.0    PCW V-Wave  mmHg 24.0    PAPi   3.8    Saturations Phases  Resting    PA  % 65    Arterial  % 90    Hemo Data  Flowsheet Row Most Recent Value  Fick Cardiac Output 5.67 L/min  Fick Cardiac Output Index 2.99 (L/min)/BSA  RA A Wave 10 mmHg  RA V Wave 8 mmHg  RA Mean 6 mmHg  RV Systolic Pressure 44 mmHg  RV Diastolic Pressure -3 mmHg  RV EDP 8 mmHg  PA Systolic Pressure 41 mmHg  PA Diastolic Pressure 18 mmHg  PA Mean 30 mmHg  PW A Wave 32 mmHg  PW V Wave 24 mmHg  PW Mean 20 mmHg  AO Systolic Pressure 160 mmHg  AO Diastolic Pressure 74 mmHg  AO Mean 112 mmHg  QP/QS 1.08  TPVR Index 9.28 HRUI  TSVR Index 37.41 HRUI  PVR SVR Ratio 0.09  TPVR/TSVR Ratio 0.25   Narrative & Impression  CLINICAL DATA:  Aortic valve replacement (TAVR), pre-op eval   EXAM: Cardiac TAVR CT   TECHNIQUE: A non-contrast, gated CT scan was obtained with axial slices of 2.5 mm through the heart for aortic valve scoring. A 120 kV retrospective, gated, contrast cardiac scan was obtained. Gantry rotation speed was 230 msec and collimation was 0.63 mm. Nitroglycerin  was not given. A delayed scan was obtained to exclude left atrial appendage thrombus. The 3D dataset was reconstructed in systole with motion correction. The 3D data set was reconstructed in 5% intervals of the 0-95% of the R-R cycle. Systolic and diastolic phases were analyzed on a dedicated workstation using MPR, MIP, and VRT modes. The patient received 100 cc of contrast.   FINDINGS: Aortic Valve:   Tricuspid aortic valve with severely reduced cusp excursion. Severely thickened and severely calcified aortic valve cusps.   AV calcium  score: 1565   Virtual Basal Annulus Measurements:   Maximum/Minimum Diameter: 25.9 x 22.5 mm mm   Perimeter: 75.3 mm   Area:  435 mm2   No significant LVOT calcifications.   Membranous septal length: 7.5 mm   Based on these measurements, the annulus would be suitable for a 26 mm Sapien 3 valve, however annulus sizing is borderline between upper limit  of 23 mm and lower limit of 26 mm. Alternatively, Heart Team can consider 29mm Evolut valve. Recommend Heart Team discussion for valve selection.   Sinus of Valsalva Measurements:   Non-coronary:  33 mm   Right - coronary:  33 mm   Left - coronary:  32 mm   Coronary height and sinus of Valsalva Height:   Left main: 15.7 mm, Left sinus: 22.3 mm   Right coronary: 17 mm, Right sinus: 23 mm   Aorta: Variant branch pattern of aortic arch: Common origin of the brachiocephalic and left common carotid arteries.   Sinotubular Junction:  28 mm   Ascending Thoracic Aorta:  35 mm   Aortic Arch:  31 mm   Descending Thoracic Aorta:  27 mm   Coronary Arteries: Normal coronary origin. Right dominance. The study was performed without use of NTG and insufficient for plaque evaluation. Coronary artery calcium  seen in LM, LAD distribution.   Optimum Fluoroscopic Angle for Delivery: RAO 1 CRA 1   OTHER:   Atria: Dilated left atrium.   Left atrial appendage: No thrombus.   Mitral valve: Grossly normal, no mitral annular calcifications.   Pulmonary artery: Severely dilated main pulmonary artery, 44 mm, suggests pulmonary hypertension.   Pulmonary veins: Normal anatomy.   Very large hiatal hernia.   IMPRESSION: 1. Tricuspid aortic valve with severely reduced cusp excursion. Severely thickened and severely calcified aortic valve cusps. 2. Aortic valve calcium  score: 1565 3. Annulus area: 435 mm2, suitable for 26 mm Sapien 3 valve however valve sizing is borderline between 23 mm and 26 mm, recommend heart team discussion for valve sizing. No LVOT calcifications. Membranous septal length 7.5 mm. 4. Sufficient coronary artery heights from annulus. 5. Optimum fluoroscopic angle for delivery: RAO 1 CRA 1 6. Severely dilated main pulmonary artery, 44 mm, suggests pulmonary hypertension. 7. Very large esophageal hiatal hernia.   Electronically Signed: By: Soyla Merck M.D. On:  10/29/2023  07:55   Narrative & Impression  CLINICAL DATA:  Preoperative aortic valve replacement   EXAM: CT ANGIOGRAPHY CHEST, ABDOMEN AND PELVIS   TECHNIQUE: Multidetector CT imaging through the chest, abdomen and pelvis was performed using the standard protocol during bolus administration of intravenous contrast. Multiplanar reconstructed images and MIPs were obtained and reviewed to evaluate the vascular anatomy.   RADIATION DOSE REDUCTION: This exam was performed according to the departmental dose-optimization program which includes automated exposure control, adjustment of the mA and/or kV according to patient size and/or use of iterative reconstruction technique.   CONTRAST:  OMNIPAQUE  IOHEXOL  350 MG/ML SOLN   COMPARISON:  None Available.   FINDINGS: CTA CHEST FINDINGS   Cardiovascular: Mild cardiomegaly. No aortic dissection or aneurysm. Central pulmonary vasculature is mildly dilated. No central pulmonary arterial filling defects. Aortic and coronary artery atherosclerosis.   Mediastinum/Nodes: No axillary, mediastinal, or hilar lymphadenopathy. Multinodular thyroid  gland with largest nodule measuring 1.7 cm. Large hiatal hernia. Trachea within normal limits.   Lungs/Pleura: Mild bibasilar atelectasis. Lungs are otherwise clear. No pleural effusion or pneumothorax.   Musculoskeletal: Bilateral shoulder prostheses. No acute bony or chest wall abnormality.   Review of the MIP images confirms the above findings.   CTA ABDOMEN AND PELVIS FINDINGS   VASCULAR   Moderate-severe aortoiliac atherosclerosis without aneurysm. Negative for aortic dissection. Greater than 50% stenosis at the origins of the bilateral renal arteries secondary to calcified atherosclerotic plaque. No additional sites of significant stenosis.   Review of the MIP images confirms the above findings.   NON-VASCULAR   Hepatobiliary: No focal liver abnormality. Tiny layering stones  in the gallbladder. No pericholecystic inflammatory changes by CT. No ductal dilatation.   Pancreas: Unremarkable. No pancreatic ductal dilatation or surrounding inflammatory changes.   Spleen: Normal in size without focal abnormality.   Adrenals/Urinary Tract: Unremarkable adrenal glands. Bilateral renal cysts, requiring no follow-up imaging. No solid renal lesion, stone, or hydronephrosis. Urinary bladder is partially distended.   Stomach/Bowel: Large hiatal hernia containing the entirety of the stomach within the chest. No dilated loops of bowel. No bowel wall thickening or inflammatory changes. Extensive colonic diverticulosis.   Lymphatic: No abdominopelvic lymphadenopathy.   Reproductive: Status post hysterectomy. No adnexal masses.   Other: No free fluid. No abdominopelvic fluid collection. No pneumoperitoneum. No abdominal wall hernia.   Musculoskeletal: Degenerative lumbar spondylosis. Thoracolumbar scoliosis. No acute osseous abnormality.   Review of the MIP images confirms the above findings.   IMPRESSION: 1. No acute findings within the chest, abdomen, or pelvis. 2. Aortic atherosclerosis without aneurysm or dissection. 3. Greater than 50% stenosis at the origins of the bilateral renal arteries secondary to calcified atherosclerotic plaque. 4. Large hiatal hernia containing the entirety of the stomach within the chest. 5. Cholelithiasis without evidence of acute cholecystitis. 6. Extensive colonic diverticulosis without evidence of acute diverticulitis. 7. Multinodular thyroid  gland. Recommend thyroid  US  (ref: J Am Coll Radiol. 2015 Feb;12(2): 143-50). 8. Aortic and coronary artery atherosclerosis (ICD10-I70.0).     Electronically Signed   By: Mabel Converse D.O.   On: 11/02/2023 10:11      Impression:  This 83 year old woman has Stage D, severe, symptomatic aortic stenosis with NYHA class III symptoms of exertional fatigue and shortness of breath  associated with exertional substernal chest discomfort, orthopnea, and dizziness.  She has significant lower extremity edema.  I have personally reviewed her 2D echocardiogram, cardiac catheterization, and CTA studies.  Her echocardiogram shows a severely calcified aortic valve with restricted leaflet  mobility.  The mean gradient is 46 mmHg.  The valve area by VTI is 1.44 cm but I do not think that is accurate.  It looks like a severely stenotic valve.  Left ventricular ejection fraction is 60 to 65%.  Cardiac catheterization shows 60% stenosis in the first diagonal branch at the ostium but otherwise no significant disease.  This can be treated medically.  Given her age I agree that transcatheter aortic valve replacement be the best option for treating her.  Her gated cardiac CTA shows anatomy suitable for TAVR using a SAPIEN 3 valve.  Her annular dimensions are sitting between a 23 and 26 mm valve but the LVOT narrows to the mid 23 range.  Her abdominal and pelvic CTA shows adequate pelvic vascular anatomy to allow transfemoral insertion.  The patient was counseled at length regarding treatment alternatives for management of severe symptomatic aortic stenosis. The risks and benefits of surgical intervention has been discussed in detail. Long-term prognosis with medical therapy was discussed. Alternative approaches such as conventional surgical aortic valve replacement, transcatheter aortic valve replacement, and palliative medical therapy were compared and contrasted at length. This discussion was placed in the context of the patient's own specific clinical presentation and past medical history. All of her questions have been addressed.   Following the decision to proceed with transcatheter aortic valve replacement, a discussion was held regarding what types of management strategies would be attempted intraoperatively in the event of life-threatening complications, including whether or not the patient would be  considered a candidate for the use of cardiopulmonary bypass and/or conversion to open sternotomy for attempted surgical intervention.  Given her age, frailty, and comorbidities I do not think she is a candidate for emergent median sternotomy to manage any intraoperative complications.  The patient has been advised of a variety of complications that might develop including but not limited to risks of death, stroke, paravalvular leak, aortic dissection or other major vascular complications, aortic annulus rupture, device embolization, cardiac rupture or perforation, mitral regurgitation, acute myocardial infarction, arrhythmia, heart block or bradycardia requiring permanent pacemaker placement, congestive heart failure, respiratory failure, renal failure, pneumonia, infection, other late complications related to structural valve deterioration or migration, or other complications that might ultimately cause a temporary or permanent loss of functional independence or other long term morbidity. The patient provides full informed consent for the procedure as described and all questions were answered.      Plan:   She will be scheduled for transfemoral TAVR using a SAPIEN 3 valve in the near future.  I spent 60 minutes performing this consultation and > 50% of this time was spent face to face counseling and coordinating the care of this patient's severe symptomatic aortic stenosis.   Dorise LOIS Fellers, MD 11/21/2023

## 2023-11-22 DIAGNOSIS — Z79891 Long term (current) use of opiate analgesic: Secondary | ICD-10-CM | POA: Diagnosis not present

## 2023-11-22 DIAGNOSIS — Z5181 Encounter for therapeutic drug level monitoring: Secondary | ICD-10-CM | POA: Diagnosis not present

## 2023-11-23 DIAGNOSIS — R3915 Urgency of urination: Secondary | ICD-10-CM | POA: Diagnosis not present

## 2023-11-23 DIAGNOSIS — R350 Frequency of micturition: Secondary | ICD-10-CM | POA: Diagnosis not present

## 2023-11-23 DIAGNOSIS — N3021 Other chronic cystitis with hematuria: Secondary | ICD-10-CM | POA: Diagnosis not present

## 2023-11-26 ENCOUNTER — Encounter: Payer: Self-pay | Admitting: Internal Medicine

## 2023-11-26 ENCOUNTER — Other Ambulatory Visit: Payer: Self-pay

## 2023-11-26 DIAGNOSIS — I350 Nonrheumatic aortic (valve) stenosis: Secondary | ICD-10-CM

## 2023-11-28 ENCOUNTER — Encounter: Admitting: Surgery

## 2023-11-30 ENCOUNTER — Other Ambulatory Visit: Payer: Self-pay

## 2023-11-30 ENCOUNTER — Ambulatory Visit (HOSPITAL_COMMUNITY)
Admission: RE | Admit: 2023-11-30 | Discharge: 2023-11-30 | Disposition: A | Discharge status: Home / Self Care | Source: Ambulatory Visit | Attending: Internal Medicine | Admitting: Internal Medicine

## 2023-11-30 ENCOUNTER — Encounter (HOSPITAL_COMMUNITY)
Admission: RE | Admit: 2023-11-30 | Discharge: 2023-11-30 | Disposition: A | Discharge status: Home / Self Care | Source: Ambulatory Visit | Attending: Internal Medicine | Admitting: Internal Medicine

## 2023-11-30 DIAGNOSIS — Z96611 Presence of right artificial shoulder joint: Secondary | ICD-10-CM | POA: Diagnosis not present

## 2023-11-30 DIAGNOSIS — I350 Nonrheumatic aortic (valve) stenosis: Secondary | ICD-10-CM | POA: Insufficient documentation

## 2023-11-30 DIAGNOSIS — Z01818 Encounter for other preprocedural examination: Secondary | ICD-10-CM | POA: Insufficient documentation

## 2023-11-30 DIAGNOSIS — Z96612 Presence of left artificial shoulder joint: Secondary | ICD-10-CM | POA: Diagnosis not present

## 2023-11-30 LAB — URINALYSIS, ROUTINE W REFLEX MICROSCOPIC
Bilirubin Urine: NEGATIVE
Glucose, UA: NEGATIVE mg/dL
Hgb urine dipstick: NEGATIVE
Ketones, ur: NEGATIVE mg/dL
Nitrite: NEGATIVE
Protein, ur: NEGATIVE mg/dL
Specific Gravity, Urine: 1.006 (ref 1.005–1.030)
pH: 6 (ref 5.0–8.0)

## 2023-11-30 LAB — CBC
HCT: 38.5 % (ref 36.0–46.0)
Hemoglobin: 12.4 g/dL (ref 12.0–15.0)
MCH: 31.6 pg (ref 26.0–34.0)
MCHC: 32.2 g/dL (ref 30.0–36.0)
MCV: 98 fL (ref 80.0–100.0)
Platelets: 293 K/uL (ref 150–400)
RBC: 3.93 MIL/uL (ref 3.87–5.11)
RDW: 14 % (ref 11.5–15.5)
WBC: 5.5 K/uL (ref 4.0–10.5)
nRBC: 0 % (ref 0.0–0.2)

## 2023-11-30 LAB — COMPREHENSIVE METABOLIC PANEL WITH GFR
ALT: 11 U/L (ref 0–44)
AST: 17 U/L (ref 15–41)
Albumin: 3.6 g/dL (ref 3.5–5.0)
Alkaline Phosphatase: 66 U/L (ref 38–126)
Anion gap: 10 (ref 5–15)
BUN: 39 mg/dL — ABNORMAL HIGH (ref 8–23)
CO2: 27 mmol/L (ref 22–32)
Calcium: 9.2 mg/dL (ref 8.9–10.3)
Chloride: 100 mmol/L (ref 98–111)
Creatinine, Ser: 1.15 mg/dL — ABNORMAL HIGH (ref 0.44–1.00)
GFR, Estimated: 48 mL/min — ABNORMAL LOW (ref >60)
Glucose, Bld: 88 mg/dL (ref 70–99)
Potassium: 4.1 mmol/L (ref 3.5–5.1)
Sodium: 137 mmol/L (ref 135–145)
Total Bilirubin: 0.5 mg/dL (ref 0.0–1.2)
Total Protein: 7.1 g/dL (ref 6.5–8.1)

## 2023-11-30 LAB — SURGICAL PCR SCREEN
MRSA, PCR: NEGATIVE
Staphylococcus aureus: NEGATIVE

## 2023-11-30 LAB — PROTIME-INR
INR: 1 (ref 0.8–1.2)
Prothrombin Time: 13.4 s (ref 11.4–15.2)

## 2023-11-30 LAB — TYPE AND SCREEN
ABO/RH(D): O POS
Antibody Screen: NEGATIVE

## 2023-11-30 MED ORDER — DEXMEDETOMIDINE HCL IN NACL 400 MCG/100ML IV SOLN
0.1000 ug/kg/h | INTRAVENOUS | Status: AC
  Administered 2023-12-04: 1 ug/kg/h via INTRAVENOUS
  Administered 2023-12-04: 41.96 ug via INTRAVENOUS
  Filled 2023-11-30: qty 100

## 2023-11-30 MED ORDER — NOREPINEPHRINE 4 MG/250ML-% IV SOLN
0.0000 ug/min | INTRAVENOUS | Status: AC
  Administered 2023-12-04: 2 ug/min via INTRAVENOUS
  Filled 2023-11-30: qty 250

## 2023-11-30 MED ORDER — MAGNESIUM SULFATE 50 % IJ SOLN
40.0000 meq | INTRAMUSCULAR | Status: DC
  Filled 2023-11-30: qty 9.85

## 2023-11-30 MED ORDER — CEFAZOLIN SODIUM-DEXTROSE 2-4 GM/100ML-% IV SOLN
2.0000 g | INTRAVENOUS | Status: AC
  Administered 2023-12-04: 2 g via INTRAVENOUS
  Filled 2023-11-30: qty 100

## 2023-11-30 MED ORDER — POTASSIUM CHLORIDE 2 MEQ/ML IV SOLN
80.0000 meq | INTRAVENOUS | Status: DC
  Filled 2023-11-30: qty 40

## 2023-11-30 MED ORDER — HEPARIN 30,000 UNITS/1000 ML (OHS) CELLSAVER SOLUTION
Status: DC
  Filled 2023-11-30: qty 1000

## 2023-11-30 NOTE — Progress Notes (Signed)
 Patient signed all consents at PAT lab appointment. CHG soap and instructions were given to patient. CHG surgical prep reviewed with patient and all questions answered.  Patients chart send to anesthesia for review. Pt denies any respiratory illness/infection in the last two months.

## 2023-12-04 ENCOUNTER — Inpatient Hospital Stay (HOSPITAL_COMMUNITY): Admitting: Anesthesiology

## 2023-12-04 ENCOUNTER — Encounter (HOSPITAL_COMMUNITY): Payer: Self-pay | Admitting: Internal Medicine

## 2023-12-04 ENCOUNTER — Encounter (HOSPITAL_COMMUNITY)
Admission: RE | Disposition: A | Discharge status: Home / Self Care | Source: Home / Self Care | Attending: Internal Medicine

## 2023-12-04 ENCOUNTER — Inpatient Hospital Stay (HOSPITAL_COMMUNITY)

## 2023-12-04 ENCOUNTER — Inpatient Hospital Stay (HOSPITAL_COMMUNITY)
Admission: RE | Admit: 2023-12-04 | Discharge: 2023-12-05 | DRG: 267 | Disposition: A | Discharge status: Home / Self Care | Attending: Internal Medicine | Admitting: Internal Medicine

## 2023-12-04 ENCOUNTER — Other Ambulatory Visit: Payer: Self-pay

## 2023-12-04 DIAGNOSIS — I10 Essential (primary) hypertension: Secondary | ICD-10-CM | POA: Diagnosis present

## 2023-12-04 DIAGNOSIS — Z8249 Family history of ischemic heart disease and other diseases of the circulatory system: Secondary | ICD-10-CM | POA: Diagnosis not present

## 2023-12-04 DIAGNOSIS — Z952 Presence of prosthetic heart valve: Secondary | ICD-10-CM

## 2023-12-04 DIAGNOSIS — N1831 Chronic kidney disease, stage 3a: Secondary | ICD-10-CM | POA: Diagnosis not present

## 2023-12-04 DIAGNOSIS — Z96612 Presence of left artificial shoulder joint: Secondary | ICD-10-CM | POA: Diagnosis present

## 2023-12-04 DIAGNOSIS — Z87440 Personal history of urinary (tract) infections: Secondary | ICD-10-CM | POA: Diagnosis not present

## 2023-12-04 DIAGNOSIS — I129 Hypertensive chronic kidney disease with stage 1 through stage 4 chronic kidney disease, or unspecified chronic kidney disease: Secondary | ICD-10-CM | POA: Diagnosis present

## 2023-12-04 DIAGNOSIS — F418 Other specified anxiety disorders: Secondary | ICD-10-CM

## 2023-12-04 DIAGNOSIS — E785 Hyperlipidemia, unspecified: Secondary | ICD-10-CM | POA: Diagnosis not present

## 2023-12-04 DIAGNOSIS — Z96653 Presence of artificial knee joint, bilateral: Secondary | ICD-10-CM | POA: Diagnosis present

## 2023-12-04 DIAGNOSIS — M1990 Unspecified osteoarthritis, unspecified site: Secondary | ICD-10-CM | POA: Diagnosis present

## 2023-12-04 DIAGNOSIS — K219 Gastro-esophageal reflux disease without esophagitis: Secondary | ICD-10-CM | POA: Diagnosis not present

## 2023-12-04 DIAGNOSIS — Z6830 Body mass index (BMI) 30.0-30.9, adult: Secondary | ICD-10-CM | POA: Diagnosis not present

## 2023-12-04 DIAGNOSIS — G8929 Other chronic pain: Secondary | ICD-10-CM | POA: Diagnosis present

## 2023-12-04 DIAGNOSIS — E669 Obesity, unspecified: Secondary | ICD-10-CM | POA: Diagnosis present

## 2023-12-04 DIAGNOSIS — R0902 Hypoxemia: Secondary | ICD-10-CM | POA: Diagnosis not present

## 2023-12-04 DIAGNOSIS — I517 Cardiomegaly: Secondary | ICD-10-CM | POA: Diagnosis not present

## 2023-12-04 DIAGNOSIS — Z96611 Presence of right artificial shoulder joint: Secondary | ICD-10-CM | POA: Diagnosis present

## 2023-12-04 DIAGNOSIS — I447 Left bundle-branch block, unspecified: Secondary | ICD-10-CM | POA: Diagnosis not present

## 2023-12-04 DIAGNOSIS — R0600 Dyspnea, unspecified: Secondary | ICD-10-CM | POA: Diagnosis not present

## 2023-12-04 DIAGNOSIS — Z7982 Long term (current) use of aspirin: Secondary | ICD-10-CM

## 2023-12-04 DIAGNOSIS — Z006 Encounter for examination for normal comparison and control in clinical research program: Secondary | ICD-10-CM | POA: Diagnosis not present

## 2023-12-04 DIAGNOSIS — M810 Age-related osteoporosis without current pathological fracture: Secondary | ICD-10-CM | POA: Diagnosis not present

## 2023-12-04 DIAGNOSIS — M353 Polymyalgia rheumatica: Secondary | ICD-10-CM | POA: Diagnosis not present

## 2023-12-04 DIAGNOSIS — Z880 Allergy status to penicillin: Secondary | ICD-10-CM | POA: Diagnosis not present

## 2023-12-04 DIAGNOSIS — N390 Urinary tract infection, site not specified: Secondary | ICD-10-CM | POA: Diagnosis present

## 2023-12-04 DIAGNOSIS — I350 Nonrheumatic aortic (valve) stenosis: Secondary | ICD-10-CM | POA: Diagnosis not present

## 2023-12-04 DIAGNOSIS — R9389 Abnormal findings on diagnostic imaging of other specified body structures: Secondary | ICD-10-CM | POA: Diagnosis not present

## 2023-12-04 DIAGNOSIS — K449 Diaphragmatic hernia without obstruction or gangrene: Secondary | ICD-10-CM | POA: Diagnosis not present

## 2023-12-04 HISTORY — DX: Nonrheumatic aortic (valve) stenosis: I35.0

## 2023-12-04 HISTORY — PX: INTRAOPERATIVE TRANSTHORACIC ECHOCARDIOGRAM: SHX6523

## 2023-12-04 LAB — ECHOCARDIOGRAM LIMITED
AR max vel: 4.33 cm2
AV Area VTI: 4.17 cm2
AV Area mean vel: 4.17 cm2
AV Mean grad: 7 mmHg
AV Peak grad: 12 mmHg
Ao pk vel: 1.73 m/s
Calc EF: 79.4 %
S' Lateral: 2.6 cm
Single Plane A2C EF: 79.7 %
Single Plane A4C EF: 76.1 %

## 2023-12-04 LAB — POCT I-STAT, CHEM 8
BUN: 39 mg/dL — ABNORMAL HIGH (ref 8–23)
Calcium, Ion: 1.29 mmol/L (ref 1.15–1.40)
Chloride: 102 mmol/L (ref 98–111)
Creatinine, Ser: 1.1 mg/dL — ABNORMAL HIGH (ref 0.44–1.00)
Glucose, Bld: 111 mg/dL — ABNORMAL HIGH (ref 70–99)
HCT: 31 % — ABNORMAL LOW (ref 36.0–46.0)
Hemoglobin: 10.5 g/dL — ABNORMAL LOW (ref 12.0–15.0)
Potassium: 4.4 mmol/L (ref 3.5–5.1)
Sodium: 139 mmol/L (ref 135–145)
TCO2: 26 mmol/L (ref 22–32)

## 2023-12-04 LAB — POCT ACTIVATED CLOTTING TIME: Activated Clotting Time: 279 s

## 2023-12-04 MED ORDER — FENTANYL CITRATE (PF) 100 MCG/2ML IJ SOLN
INTRAMUSCULAR | Status: AC
Start: 2023-12-04 — End: 2023-12-05
  Filled 2023-12-04: qty 2

## 2023-12-04 MED ORDER — ONDANSETRON HCL 4 MG/2ML IJ SOLN: 4.0000 mg | Freq: Once | INTRAMUSCULAR | Status: DC | PRN

## 2023-12-04 MED ORDER — OXYCODONE HCL 5 MG PO TABS
ORAL_TABLET | ORAL | Status: AC
Start: 2023-12-04 — End: 2023-12-04
  Filled 2023-12-04: qty 1

## 2023-12-04 MED ORDER — ROSUVASTATIN CALCIUM 20 MG PO TABS
20.0000 mg | ORAL_TABLET | Freq: Every day | ORAL | Status: DC
  Administered 2023-12-05: 20 mg via ORAL
  Filled 2023-12-04: qty 1

## 2023-12-04 MED ORDER — SODIUM CHLORIDE 0.9 % IV SOLN: 250.0000 mL | INTRAVENOUS | Status: DC | PRN

## 2023-12-04 MED ORDER — LIDOCAINE HCL (PF) 1 % IJ SOLN
INTRAMUSCULAR | Status: DC | PRN
  Administered 2023-12-04 (×2): 5 mL via INTRADERMAL

## 2023-12-04 MED ORDER — ONDANSETRON HCL 4 MG/2ML IJ SOLN
4.0000 mg | Freq: Four times a day (QID) | INTRAMUSCULAR | Status: DC | PRN
  Administered 2023-12-05: 4 mg via INTRAVENOUS
  Filled 2023-12-04: qty 2

## 2023-12-04 MED ORDER — TRAMADOL HCL 50 MG PO TABS: 50.0000 mg | ORAL_TABLET | ORAL | Status: DC | PRN

## 2023-12-04 MED ORDER — PROPOFOL 10 MG/ML IV BOLUS
INTRAVENOUS | Status: DC | PRN
  Administered 2023-12-04: 15 mg via INTRAVENOUS

## 2023-12-04 MED ORDER — SODIUM CHLORIDE 0.9 % IV SOLN: INTRAVENOUS | Status: AC

## 2023-12-04 MED ORDER — LACTATED RINGERS IV SOLN: INTRAVENOUS | Status: DC

## 2023-12-04 MED ORDER — SODIUM CHLORIDE 0.9 % IV SOLN: INTRAVENOUS | Status: DC

## 2023-12-04 MED ORDER — LIDOCAINE HCL (PF) 1 % IJ SOLN
INTRAMUSCULAR | Status: AC
  Filled 2023-12-04: qty 30

## 2023-12-04 MED ORDER — PROPOFOL 500 MG/50ML IV EMUL
INTRAVENOUS | Status: DC | PRN
  Administered 2023-12-04: 30 ug/kg/min via INTRAVENOUS

## 2023-12-04 MED ORDER — MORPHINE SULFATE (PF) 2 MG/ML IV SOLN: 1.0000 mg | INTRAVENOUS | Status: DC | PRN

## 2023-12-04 MED ORDER — CHLORHEXIDINE GLUCONATE 4 % EX SOLN: 60.0000 mL | Freq: Once | CUTANEOUS | Status: DC

## 2023-12-04 MED ORDER — FENTANYL CITRATE (PF) 100 MCG/2ML IJ SOLN
INTRAMUSCULAR | Status: AC
Start: 2023-12-04 — End: 2023-12-04
  Filled 2023-12-04: qty 2

## 2023-12-04 MED ORDER — ACETAMINOPHEN 500 MG PO TABS
1000.0000 mg | ORAL_TABLET | Freq: Once | ORAL | Status: AC
  Administered 2023-12-04: 1000 mg via ORAL
  Filled 2023-12-04: qty 2

## 2023-12-04 MED ORDER — CHLORHEXIDINE GLUCONATE 0.12 % MT SOLN
15.0000 mL | Freq: Once | OROMUCOSAL | Status: AC
  Administered 2023-12-04: 15 mL via OROMUCOSAL
  Filled 2023-12-04: qty 15

## 2023-12-04 MED ORDER — NOREPINEPHRINE 4 MG/250ML-% IV SOLN: 0.0000 ug/min | INTRAVENOUS | Status: DC

## 2023-12-04 MED ORDER — PROTAMINE SULFATE 10 MG/ML IV SOLN
INTRAVENOUS | Status: DC | PRN
  Administered 2023-12-04: 130 mg via INTRAVENOUS

## 2023-12-04 MED ORDER — SODIUM CHLORIDE 0.9% FLUSH
3.0000 mL | Freq: Two times a day (BID) | INTRAVENOUS | Status: DC
  Administered 2023-12-05: 3 mL via INTRAVENOUS

## 2023-12-04 MED ORDER — FERROUS SULFATE 325 (65 FE) MG PO TABS
325.0000 mg | ORAL_TABLET | Freq: Every day | ORAL | Status: DC
Start: 2023-12-05 — End: 2023-12-05
  Administered 2023-12-05: 325 mg via ORAL
  Filled 2023-12-04: qty 1

## 2023-12-04 MED ORDER — BACLOFEN 10 MG PO TABS
10.0000 mg | ORAL_TABLET | Freq: Every day | ORAL | Status: DC | PRN
  Administered 2023-12-04: 10 mg via ORAL
  Filled 2023-12-04: qty 1

## 2023-12-04 MED ORDER — IODIXANOL 320 MG/ML IV SOLN
INTRAVENOUS | Status: DC | PRN
  Administered 2023-12-04: 50 mL via INTRA_ARTERIAL

## 2023-12-04 MED ORDER — FLUTICASONE PROPIONATE 50 MCG/ACT NA SUSP
2.0000 | Freq: Every day | NASAL | Status: DC
  Administered 2023-12-05: 2 via NASAL
  Filled 2023-12-04: qty 16

## 2023-12-04 MED ORDER — PANTOPRAZOLE SODIUM 40 MG PO TBEC
40.0000 mg | DELAYED_RELEASE_TABLET | Freq: Every day | ORAL | Status: DC
  Administered 2023-12-05: 40 mg via ORAL
  Filled 2023-12-04: qty 1

## 2023-12-04 MED ORDER — ONDANSETRON HCL 4 MG/2ML IJ SOLN
INTRAMUSCULAR | Status: DC | PRN
  Administered 2023-12-04: 4 mg via INTRAVENOUS

## 2023-12-04 MED ORDER — FENTANYL CITRATE (PF) 100 MCG/2ML IJ SOLN
INTRAMUSCULAR | Status: DC | PRN
  Administered 2023-12-04: 25 ug via INTRAVENOUS

## 2023-12-04 MED ORDER — NITROGLYCERIN IN D5W 200-5 MCG/ML-% IV SOLN: 0.0000 ug/min | INTRAVENOUS | Status: DC

## 2023-12-04 MED ORDER — SODIUM CHLORIDE 0.9% FLUSH: 3.0000 mL | INTRAVENOUS | Status: DC | PRN

## 2023-12-04 MED ORDER — CEFAZOLIN SODIUM-DEXTROSE 2-4 GM/100ML-% IV SOLN
2.0000 g | Freq: Three times a day (TID) | INTRAVENOUS | Status: AC
  Administered 2023-12-04 (×2): 2 g via INTRAVENOUS
  Filled 2023-12-04 (×2): qty 100

## 2023-12-04 MED ORDER — LORATADINE 10 MG PO TABS
10.0000 mg | ORAL_TABLET | Freq: Every day | ORAL | Status: DC
  Administered 2023-12-04 – 2023-12-05 (×2): 10 mg via ORAL
  Filled 2023-12-04 (×2): qty 1

## 2023-12-04 MED ORDER — HEPARIN SODIUM (PORCINE) 1000 UNIT/ML IJ SOLN
INTRAMUSCULAR | Status: DC | PRN
  Administered 2023-12-04: 13000 [IU] via INTRAVENOUS

## 2023-12-04 MED ORDER — ACETAMINOPHEN 650 MG RE SUPP: 650.0000 mg | Freq: Four times a day (QID) | RECTAL | Status: DC | PRN

## 2023-12-04 MED ORDER — FLUOXETINE HCL 20 MG PO CAPS
20.0000 mg | ORAL_CAPSULE | Freq: Every day | ORAL | Status: DC
  Administered 2023-12-04 – 2023-12-05 (×2): 20 mg via ORAL
  Filled 2023-12-04 (×2): qty 1

## 2023-12-04 MED ORDER — TRIMETHOPRIM 100 MG PO TABS
100.0000 mg | ORAL_TABLET | Freq: Every day | ORAL | Status: DC
  Administered 2023-12-04: 100 mg via ORAL
  Filled 2023-12-04 (×2): qty 1

## 2023-12-04 MED ORDER — ACETAMINOPHEN 325 MG PO TABS
650.0000 mg | ORAL_TABLET | Freq: Four times a day (QID) | ORAL | Status: DC | PRN
  Administered 2023-12-04: 650 mg via ORAL

## 2023-12-04 MED ORDER — OXYCODONE HCL 5 MG PO TABS
5.0000 mg | ORAL_TABLET | ORAL | Status: DC | PRN
  Administered 2023-12-04: 10 mg via ORAL
  Administered 2023-12-04: 5 mg via ORAL
  Administered 2023-12-04 – 2023-12-05 (×2): 10 mg via ORAL
  Filled 2023-12-04 (×3): qty 2

## 2023-12-04 MED ORDER — AMLODIPINE BESYLATE 5 MG PO TABS
5.0000 mg | ORAL_TABLET | Freq: Every day | ORAL | Status: DC
Start: 2023-12-04 — End: 2023-12-05
  Administered 2023-12-04 – 2023-12-05 (×2): 5 mg via ORAL
  Filled 2023-12-04 (×2): qty 1

## 2023-12-04 MED ORDER — FENTANYL CITRATE (PF) 100 MCG/2ML IJ SOLN
25.0000 ug | INTRAMUSCULAR | Status: DC | PRN
  Administered 2023-12-04: 50 ug via INTRAVENOUS
  Administered 2023-12-04 (×2): 25 ug via INTRAVENOUS

## 2023-12-04 MED ORDER — ACETAMINOPHEN 325 MG PO TABS
ORAL_TABLET | ORAL | Status: AC
  Filled 2023-12-04: qty 2

## 2023-12-04 MED ORDER — CHLORHEXIDINE GLUCONATE 4 % EX SOLN: 30.0000 mL | CUTANEOUS | Status: DC

## 2023-12-04 NOTE — Progress Notes (Signed)
  [facility]   MULTIDISCIPLINARY HEART VALVE TEAM  Patient doing well s/p TAVR. She is hemodynamically stable, but BP on soft side. Groin sites stable. ECG with new LBBB but no high grade block. Having bad back pain (chronic) and holding breath. Given pain medication. Noted to by hypoxic with 02 sats 92% on 2L [place] currently. Plan to transfer to from cath lab holding to 4E when bed available. Early ambulation after bedrest completed and hopeful discharge over the next 24-48 hours.   Lamarr Hummer PA-C  [facility]  Pager [phone]

## 2023-12-04 NOTE — Anesthesia Preprocedure Evaluation (Signed)
 Anesthesia Evaluation  Patient identified by MRN, date of birth, ID band Patient awake    Reviewed: Allergy & Precautions, NPO status , Patient's Chart, lab work & pertinent test results, reviewed documented beta blocker date and time   Airway Mallampati: III  TM Distance: >3 FB Neck ROM: Full    Dental  (+) Teeth Intact, Dental Advisory Given   Pulmonary neg pulmonary ROS   Pulmonary exam normal breath sounds clear to auscultation       Cardiovascular hypertension, Pt. on home beta blockers and Pt. on medications + Valvular Problems/Murmurs AS  Rhythm:Regular Rate:Normal + Systolic murmurs    Neuro/Psych  PSYCHIATRIC DISORDERS Anxiety Depression     Neuromuscular disease    GI/Hepatic Neg liver ROS, hiatal hernia,GERD  Medicated and Controlled,,  Endo/Other  negative endocrine ROS    Renal/GU Renal InsufficiencyRenal disease     Musculoskeletal  (+) Arthritis ,    Abdominal   Peds  Hematology negative hematology ROS (+)   Anesthesia Other Findings Day of surgery medications reviewed with the patient.  Reproductive/Obstetrics                              Anesthesia Physical Anesthesia Plan  ASA: 4  Anesthesia Plan: MAC   Post-op Pain Management: Minimal or no pain anticipated and Tylenol  PO (pre-op)*   Induction: Intravenous  PONV Risk Score and Plan: 2 and TIVA, Dexamethasone  and Ondansetron   Airway Management Planned: Natural Airway and Simple Face Mask  Additional Equipment:   Intra-op Plan:   Post-operative Plan:   Informed Consent: I have reviewed the patients History and Physical, chart, labs and discussed the procedure including the risks, benefits and alternatives for the proposed anesthesia with the patient or authorized representative who has indicated his/her understanding and acceptance.     Dental advisory given  Plan Discussed with: CRNA  Anesthesia Plan  Comments:         Anesthesia Quick Evaluation

## 2023-12-04 NOTE — Transfer of Care (Signed)
 Immediate Anesthesia Transfer of Care Note  Patient: Tracey Walton  Procedure(s) Performed: Transcatheter Aortic Valve Replacement, Transfemoral (Right) ECHOCARDIOGRAM, TRANSTHORACIC  Patient Location: PACU  Anesthesia Type:MAC  Level of Consciousness: drowsy  Airway & Oxygen Therapy: Patient Spontanous Breathing and Patient connected to face mask oxygen  Post-op Assessment: Report given to RN and Post -op Vital signs reviewed and stable  Post vital signs: Reviewed and stable  Last Vitals:  Vitals Value Taken Time  BP 98/49 12/04/23 13:30  Temp    Pulse 76 12/04/23 13:33  Resp 20 12/04/23 13:33  SpO2 92 % 12/04/23 13:33  Vitals shown include unfiled device data.  Last Pain:  Vitals:   12/04/23 1317  TempSrc:   PainSc: Asleep         Complications: There were no known notable events for this encounter.

## 2023-12-04 NOTE — Op Note (Signed)
 [facility]   MULTIDISCIPLINARY HEART VALVE TEAM   TAVR OPERATIVE NOTE     Date of Procedure:                12/04/2023   Preoperative Diagnosis:      Severe Aortic Stenosis    Postoperative Diagnosis:    Same    Procedure:        Transcatheter Aortic Valve Replacement - Transfemoral Approach             Edwards Sapien 3 Resilia THV (size 26 mm, model # 9755RLS, serial # [id number])              Co-Surgeons:                        Linnie Rayas, MD and Lurena Red, MD Anesthesiologist:                  Corinne   Echocardiographer:              Delford   Pre-operative Echo Findings: Severe aortic stenosis Normal left ventricular systolic function   Post-operative Echo Findings: No paravalvular leak Normal left ventricular systolic function   Left Heart Catheterization Findings: Left ventricular end-diastolic pressure of 16 mmHg     BRIEF CLINICAL NOTE AND INDICATIONS FOR SURGERY   The patient is an 83 year old female with a history of hypertension, hyperlipidemia, CKD stage IIIa, osteoarthritis, frailty, and severe symptomatic aortic stenosis who was referred for an elective transcatheter aortic valve replacement with a 26 mm SAPIEN 3 valve from the right transfemoral approach.   During the course of the patient's preoperative work up they have been evaluated comprehensively by a multidisciplinary team of specialists coordinated through the Multidisciplinary Heart Valve Clinic in the [facility].  They have been demonstrated to suffer from symptomatic severe aortic stenosis as noted above. The patient has been counseled extensively as to the relative risks and benefits of all options for the treatment of severe aortic stenosis including long term medical therapy, conventional surgery for aortic valve replacement, and transcatheter aortic valve replacement.  The patient has been independently evaluated by Dr. Rayas with CT surgery and  they are felt to be at high risk for conventional surgical aortic valve replacement. The surgeon indicated the patient would be a poor candidate for conventional surgery. Based upon review of all of the patient's preoperative diagnostic tests they are felt to be candidate for transcatheter aortic valve replacement using the transfemoral approach as an alternative to high risk conventional surgery.     Following the decision to proceed with transcatheter aortic valve replacement, a discussion has been held regarding what types of management strategies would be attempted intraoperatively in the event of life-threatening complications, including whether or not the patient would be considered a candidate for the use of cardiopulmonary bypass and/or conversion to open sternotomy for attempted surgical intervention.  The patient has been advised of a variety of complications that might develop peculiar to this approach including but not limited to risks of death, stroke, paravalvular leak, aortic dissection or other major vascular complications, aortic annulus rupture, device embolization, cardiac rupture or perforation, acute myocardial infarction, arrhythmia, heart block or bradycardia requiring permanent pacemaker placement, congestive heart failure, respiratory failure, renal failure, pneumonia, infection, other late complications related to structural valve deterioration or migration, or other complications that might ultimately cause a temporary or permanent loss of functional independence or other  long term morbidity.  The patient provides full informed consent for the procedure as described and all questions were answered preoperatively.       DETAILS OF THE OPERATIVE PROCEDURE   PREPARATION:   The patient is brought to the operating room on the above mentioned date and central monitoring was established by the anesthesia team. The patient is placed in the supine position on the operating table.   Intravenous antibiotics are administered. Conscious sedation is used.    Baseline transthoracic echocardiogram was performed. The patient's chest, abdomen, both groins, and both lower extremities are prepared and draped in a sterile manner. A time out procedure is performed.     PERIPHERAL ACCESS:   Using the modified Seldinger technique, femoral arterial and venous access were obtained with placement of a 6 Fr sheath in the left common femoral artery using u/s guidance.  A pigtail diagnostic catheter was passed through the femoral arterial sheath under fluoroscopic guidance into the aortic root.  Aortic root angiography was performed in order to determine the optimal angiographic angle for valve deployment.   TRANSFEMORAL ACCESS:  A micropuncture kit was used to gain access to the right common femoral artery using u/s guidance. Position confirmed with angiography. Pre-closure with double ProGlide closure devices. The patient was heparinized systemically and ACT verified > 250 seconds.     A 14 Fr transfemoral E-sheath was introduced into the right common femoral artery after progressively dilating over an Amplatz superstiff wire. An AL-1 catheter was used to direct a straight-tip exchange length wire across the native aortic valve into the left ventricle. This was exchanged out for a pigtail catheter and position was confirmed in the LV apex. Simultaneous left ventricular, aortic, and left ventricular end-diastolic pressures were recorded.  The pigtail catheter was then exchanged for an Safari wire in the LV apex.  Direct LV pacing thresholds were assessed and found to be adequate.    TRANSCATHETER HEART VALVE DEPLOYMENT:  An Edwards Sapien 3 THV (size 26 mm) was prepared and crimped per manufacturer's guidelines, and the proper orientation of the valve is confirmed on the [employer] delivery system. The valve was advanced through the introducer sheath using normal technique until in an  appropriate position in the abdominal aorta beyond the sheath tip. The balloon was then retracted and using the fine-tuning wheel was centered on the valve. The valve was then advanced across the aortic arch using appropriate flexion of the catheter. The valve was carefully positioned across the aortic valve annulus. The Commander catheter was retracted using normal technique. Once final position of the valve has been confirmed by angiographic assessment, the valve is deployed while temporarily holding ventilation and during rapid ventricular pacing to maintain systolic blood pressure < 50 mmHg and pulse pressure < 10 mmHg. The balloon inflation is held for >3 seconds after reaching full deployment volume. Once the balloon has fully deflated the balloon is retracted into the ascending aorta and valve function is assessed using TTE. There is felt to be no paravalvular leak and no central aortic insufficiency.  The patient's hemodynamic recovery following valve deployment is good.  The deployment balloon and guidewire are both removed. Echo demostrated acceptable post-procedural gradients, stable mitral valve function, and no AI.    PROCEDURE COMPLETION:  The sheath was then removed and closure devices were completed. Protamine  was administered once femoral arterial repair was complete. The temporary pacemaker, pigtail catheters and femoral sheaths were removed with  a Mynx closure device placed in the  artery and manual pressure used for venous hemostasis.     The patient tolerated the procedure well and is transported to the surgical intensive care in stable condition. There were no immediate intraoperative complications. All sponge instrument and needle counts are verified correct at completion of the operation.    No blood products were administered during the operation.   The patient received a total of 50 mL of intravenous contrast during the procedure.

## 2023-12-04 NOTE — Plan of Care (Signed)

## 2023-12-04 NOTE — Interval H&P Note (Signed)
 History and Physical Interval Note:  12/04/2023 11:35 AM  Tracey Walton  has presented today for surgery, with the diagnosis of Severe Aortic Stenosis.  The various methods of treatment have been discussed with the patient and family. After consideration of risks, benefits and other options for treatment, the patient has consented to  Procedure(s): Transcatheter Aortic Valve Replacement, Transfemoral (Right) ECHOCARDIOGRAM, TRANSTHORACIC (N/A) as a surgical intervention.  The patient's history has been reviewed, patient examined, no change in status, stable for surgery.  I have reviewed the patient's chart and labs.  Questions were answered to the patient's satisfaction.     Tracey Walton

## 2023-12-04 NOTE — Op Note (Signed)
 HEART AND VASCULAR CENTER  TAVR OPERATIVE NOTE   Date of Procedure:  12/04/2023  Preoperative Diagnosis: Severe Aortic Stenosis   Postoperative Diagnosis: Same   Procedure:   Transcatheter Aortic Valve Replacement - Transfemoral Approach  Edwards Sapien 3 Resilia THV (size 26 mm, model # 9755RLS, serial # [id number])   Co-Surgeons:  Linnie Rayas, MD and Lurena Red, MD Anesthesiologist:  Corinne  Echocardiographer:  Delford  Pre-operative Echo Findings: Severe aortic stenosis Normal left ventricular systolic function  Post-operative Echo Findings: No paravalvular leak Normal left ventricular systolic function  Left Heart Catheterization Findings: Left ventricular end-diastolic pressure of 16 mmHg   BRIEF CLINICAL NOTE AND INDICATIONS FOR SURGERY  The patient is an 83 year old female with a history of hypertension, hyperlipidemia, CKD stage IIIa, osteoarthritis, frailty, and severe symptomatic aortic stenosis who was referred for an elective transcatheter aortic valve replacement with a 26 mm SAPIEN 3 valve from the right transfemoral approach.  During the course of the patient's preoperative work up they have been evaluated comprehensively by a multidisciplinary team of specialists coordinated through the Multidisciplinary Heart Valve Clinic in the [facility].  They have been demonstrated to suffer from symptomatic severe aortic stenosis as noted above. The patient has been counseled extensively as to the relative risks and benefits of all options for the treatment of severe aortic stenosis including long term medical therapy, conventional surgery for aortic valve replacement, and transcatheter aortic valve replacement.  The patient has been independently evaluated by Dr. Rayas with CT surgery and they are felt to be at high risk for conventional surgical aortic valve replacement. The surgeon indicated the patient would be a poor candidate for  conventional surgery. Based upon review of all of the patient's preoperative diagnostic tests they are felt to be candidate for transcatheter aortic valve replacement using the transfemoral approach as an alternative to high risk conventional surgery.    Following the decision to proceed with transcatheter aortic valve replacement, a discussion has been held regarding what types of management strategies would be attempted intraoperatively in the event of life-threatening complications, including whether or not the patient would be considered a candidate for the use of cardiopulmonary bypass and/or conversion to open sternotomy for attempted surgical intervention.  The patient has been advised of a variety of complications that might develop peculiar to this approach including but not limited to risks of death, stroke, paravalvular leak, aortic dissection or other major vascular complications, aortic annulus rupture, device embolization, cardiac rupture or perforation, acute myocardial infarction, arrhythmia, heart block or bradycardia requiring permanent pacemaker placement, congestive heart failure, respiratory failure, renal failure, pneumonia, infection, other late complications related to structural valve deterioration or migration, or other complications that might ultimately cause a temporary or permanent loss of functional independence or other long term morbidity.  The patient provides full informed consent for the procedure as described and all questions were answered preoperatively.    DETAILS OF THE OPERATIVE PROCEDURE  PREPARATION:   The patient is brought to the operating room on the above mentioned date and central monitoring was established by the anesthesia team. The patient is placed in the supine position on the operating table.  Intravenous antibiotics are administered. Conscious sedation is used.   Baseline transthoracic echocardiogram was performed. The patient's chest, abdomen, both  groins, and both lower extremities are prepared and draped in a sterile manner. A time out procedure is performed.   PERIPHERAL ACCESS:   Using the modified Seldinger technique, femoral  arterial and venous access were obtained with placement of a 6 Fr sheath in the left common femoral artery using u/s guidance.  A pigtail diagnostic catheter was passed through the femoral arterial sheath under fluoroscopic guidance into the aortic root.  Aortic root angiography was performed in order to determine the optimal angiographic angle for valve deployment.  TRANSFEMORAL ACCESS:  A micropuncture kit was used to gain access to the right common femoral artery using u/s guidance. Position confirmed with angiography. Pre-closure with double ProGlide closure devices. The patient was heparinized systemically and ACT verified > 250 seconds.    A 14 Fr transfemoral E-sheath was introduced into the right common femoral artery after progressively dilating over an Amplatz superstiff wire. An AL-1 catheter was used to direct a straight-tip exchange length wire across the native aortic valve into the left ventricle. This was exchanged out for a pigtail catheter and position was confirmed in the LV apex. Simultaneous left ventricular, aortic, and left ventricular end-diastolic pressures were recorded.  The pigtail catheter was then exchanged for an Safari wire in the LV apex.  Direct LV pacing thresholds were assessed and found to be adequate.   TRANSCATHETER HEART VALVE DEPLOYMENT:  An Edwards Sapien 3 THV (size 26 mm) was prepared and crimped per manufacturer's guidelines, and the proper orientation of the valve is confirmed on the [employer] delivery system. The valve was advanced through the introducer sheath using normal technique until in an appropriate position in the abdominal aorta beyond the sheath tip. The balloon was then retracted and using the fine-tuning wheel was centered on the valve. The valve was then  advanced across the aortic arch using appropriate flexion of the catheter. The valve was carefully positioned across the aortic valve annulus. The Commander catheter was retracted using normal technique. Once final position of the valve has been confirmed by angiographic assessment, the valve is deployed while temporarily holding ventilation and during rapid ventricular pacing to maintain systolic blood pressure < 50 mmHg and pulse pressure < 10 mmHg. The balloon inflation is held for >3 seconds after reaching full deployment volume. Once the balloon has fully deflated the balloon is retracted into the ascending aorta and valve function is assessed using TTE. There is felt to be no paravalvular leak and no central aortic insufficiency.  The patient's hemodynamic recovery following valve deployment is good.  The deployment balloon and guidewire are both removed. Echo demostrated acceptable post-procedural gradients, stable mitral valve function, and no AI.   PROCEDURE COMPLETION:  The sheath was then removed and closure devices were completed. Protamine  was administered once femoral arterial repair was complete. The temporary pacemaker, pigtail catheters and femoral sheaths were removed with  a Mynx closure device placed in the artery and manual pressure used for venous hemostasis.    The patient tolerated the procedure well and is transported to the surgical intensive care in stable condition. There were no immediate intraoperative complications. All sponge instrument and needle counts are verified correct at completion of the operation.   No blood products were administered during the operation.  The patient received a total of 50 mL of intravenous contrast during the procedure.  Jax Abdelrahman K Lounell Schumacher MD 12/04/2023 1:30 PM

## 2023-12-04 NOTE — Interval H&P Note (Signed)
 History and Physical Interval Note:  12/04/2023 9:25 AM  Tracey Walton  has presented today for surgery, with the diagnosis of Severe Aortic Stenosis.  The various methods of treatment have been discussed with the patient and family. After consideration of risks, benefits and other options for treatment, the patient has consented to  Procedure(s): Transcatheter Aortic Valve Replacement, Transfemoral (Right) ECHOCARDIOGRAM, TRANSTHORACIC (N/A) as a surgical intervention.  The patient's history has been reviewed, patient examined, no change in status, stable for surgery.  I have reviewed the patient's chart and labs.  Questions were answered to the patient's satisfaction.     Dolorez Jeffrey K Carlota Philley

## 2023-12-04 NOTE — Discharge Summary (Incomplete)
 HEART AND VASCULAR CENTER   MULTIDISCIPLINARY HEART VALVE TEAM  Discharge Summary    Patient ID: Tracey Walton MRN: [id number]; DOB: December 31, 1940  Admit date: 12/04/2023 Discharge date: 12/05/2023  PCP:  Chandra Toribio POUR, MD  [facility] Cardiologist:  Lurena POUR Red, MD (previously Nahser pt) [facility] Structural heart: Lurena POUR Red, MD [facility] Electrophysiologist:  None   Discharge Diagnoses    Principal Problem:   S/P TAVR (transcatheter aortic valve replacement) Active Problems:   Hyperlipidemia   Obesity with body mass index of 30.0-39.9   Severe aortic stenosis   Polymyalgia (HCC)   Essential hypertension   Recurrent UTI (urinary tract infection)   Allergies Allergies  Allergen Reactions   Penicillins Rash    Pt tolerated 2g ancef  without issue 10/12/16    Diagnostic Studies/Procedures    HEART AND VASCULAR CENTER  TAVR OPERATIVE NOTE     Date of Procedure:                12/04/2023   Preoperative Diagnosis:      Severe Aortic Stenosis    Postoperative Diagnosis:    Same    Procedure:        Transcatheter Aortic Valve Replacement - Transfemoral Approach             Edwards Sapien 3 Resilia THV (size 26 mm, model # 9755RLS, serial # [id number])              Co-Surgeons:                        Linnie Rayas, MD and Lurena Red, MD Anesthesiologist:                  Corinne   Echocardiographer:              Delford   Pre-operative Echo Findings: Severe aortic stenosis Normal left ventricular systolic function   Post-operative Echo Findings: No paravalvular leak Normal left ventricular systolic function   Left Heart Catheterization Findings: Left ventricular end-diastolic pressure of 16 mmHg _____________    Echo 12/05/23:  IMPRESSIONS   1. Left ventricular ejection fraction, by estimation, is 65 to 70%. The  left ventricle has normal function. The left ventricle has no regional  wall motion abnormalities. There is mild left  ventricular hypertrophy.  Left ventricular diastolic parameters  are consistent with Grade I diastolic dysfunction (impaired relaxation).   2. Right ventricular systolic function is normal. The right ventricular  size is mildly enlarged. There is normal pulmonary artery systolic  pressure. The estimated right ventricular systolic pressure is 31.7 mmHg.   3. Left atrial size was moderately dilated.   4. The mitral valve is normal in structure. Trivial mitral valve  regurgitation.   5. The aortic valve has been repaired/replaced. Aortic valve  regurgitation is not visualized. There is a 26 mm Edwards Sapien  prosthetic (TAVR) valve present in the aortic position. Echo findings are  consistent with normal structure and function of the  aortic valve prosthesis. Vmax 2.5 m/s, MG , DI 0.8, EOA 2.8 cm^2   6. The inferior vena cava is normal in size with greater than 50%  respiratory variability, suggesting right atrial pressure of 3 mmHg.   History of Present Illness     BRIENNA Walton is a 83 y.o. female with a history of HTN, HLD, CKD stage IIIa, and severe AS who presented to [facility] on 12/04/23 for planned TAVR.  She was seen by Dr. Alveta recently and did report occasional episodes of chest pressure associated with exertion as well as shortness of breath. Echo 09/06/23 showed EF 60% and severe AS with a mean grad 46 mmHg,AVA 1.44 cm2 (not felt to be accurate). [facility] 10/19/23 showed single-vessel ostial diagonal disease which will be treated medically. Fick cardiac output of 5.7 L/min and Fick cardiac index of 3.0 L/min/m with the following hemodynamics: RA mean of 6 mmHg, RV 44/-3 with an end-diastolic pressure of 8 mmHg, Wedge pressure mean of 20 mmHg with V waves to 32 mmHg, PA pressures of 41/18 with a mean of 30 mmHg, PVR of 1.75 Woods units, PA pulsatility index of 3.8.  The patient was evaluated by the multidisciplinary valve team and felt to have severe, symptomatic aortic stenosis and  to be a suitable candidate for TAVR, which was set up for 12/04/23.   Hospital Course     Consultants: none   Severe AS:  -- S/p successful TAVR with a 26 mm Edwards Sapien 3 Ultra Resilia THV via the TF approach on 12/04/23.  -- Post operative echo showed EF 65%, mild RVE, normally functioning TAVR with a mean gradient of 13 mmHg and no PVL. -- Groin sites are stable.  -- Started on a baby aspirin  81mg  daily.  -- Met with cardiac rehab to discuss CRP phase II.  -- Plan for discharge home today with close follow up in the outpatient setting.   New LBBB: -- Developed a new LBBB after TAVR.  -- Will discharge with a Zio AT to rule out delayed HAVB.   HTN: -- BP well controlled.  -- Resume amlodipine  5mg  daily. -- Resume carvedolol 6.25mg  BID. -- Resume valsartan  40mg  daily.  HLD: -- Continue Crestor  20mg  daily.  CKD stage IIIa:  -- Creat stable 1.09.   Hypoxia:  -- Noted s/p TAVR. -- CXR with no acute abnormalities aside from large hiatal. -- Treated with one dose if IV Lasix .  -- ? If related to lung collapse with entire stomach in chest 2/2 hiatal hernia. -- Discharge home with home 02.  Hiatal hernia:  -- Large hiatal hernia containing the entirety of the stomach within the chest. -- Will have Dr .Shyrl review.   Recurrent UTI: -- Continue trimethoprim  100 mg daily.  _____________  Discharge Vitals Blood pressure (!) 131/57, pulse 77, temperature 98.6 F (37 C), temperature source Oral, resp. rate (!) 22, height 5' 4 (1.626 m), weight 91.5 kg, SpO2 92%.  Filed Weights   12/04/23 0907 12/04/23 1734 12/05/23 0500  Weight: 88.5 kg 88.9 kg 91.5 kg     GEN: Well nourished, well developed in no acute distress NECK: No JVD CARDIAC: RRR, soft flow murmurs, rubs, gallops RESPIRATORY:  Clear to auscultation without rales, wheezing or rhonchi  ABDOMEN: Soft, non-tender, non-distended EXTREMITIES:  No edema; No deformity.  Groin sites clear without hematoma or  ecchymosis.    Disposition   Pt is being discharged home today in good condition.  Follow-up Plans & Appointments     Follow-up Information     Sebastian Lamarr SAUNDERS, PA-C. Go on 12/04/2023.   Specialties: Cardiology, Radiology Contact information: [address] [place] [id number] [phone]                  Discharge Medications   Allergies as of 12/05/2023       Reactions   Penicillins Rash   Pt tolerated 2g ancef  without issue 10/12/16  Medication List     TAKE these medications    acetaminophen  325 MG tablet Commonly known as: TYLENOL  Take 650 mg by mouth 4 (four) times daily as needed for moderate pain (pain score 4-6).   amLODipine  5 MG tablet Commonly known as: NORVASC  TAKE 1 TABLET BY MOUTH DAILY   aspirin  EC 81 MG tablet Take 1 tablet (81 mg total) by mouth daily. Swallow whole.   baclofen  10 MG tablet Commonly known as: LIORESAL  Take 10 mg by mouth daily as needed for muscle spasms.   carvedilol  6.25 MG tablet Commonly known as: COREG  TAKE 1 TABLET BY MOUTH TWICE  DAILY   cetirizine 10 MG tablet Commonly known as: ZYRTEC Take 10 mg by mouth daily.   coal tar-salicylic acid  2 % shampoo Apply topically daily as needed for itching.   ferrous sulfate  325 (65 FE) MG tablet Take 1 tablet (325 mg total) by mouth daily with breakfast. Take on Monday, Wednesday, Fridays   FLUoxetine  20 MG capsule Commonly known as: PROZAC  Take 1 capsule (20 mg total) by mouth daily.   fluticasone  50 MCG/ACT nasal spray Commonly known as: FLONASE  Place 2 sprays into both nostrils daily.   HYDROcodone -acetaminophen  5-325 MG tablet Commonly known as: NORCO/VICODIN Take 2 tablets by mouth every 4 (four) hours as needed for moderate pain (pain score 4-6).   naproxen  500 MG tablet Commonly known as: NAPROSYN  TAKE 1 TABLET BY MOUTH TWICE  DAILY WITH MEALS What changed: when to take this   omeprazole  20 MG capsule Commonly known as:  PRILOSEC TAKE 1 CAPSULE BY MOUTH DAILY   Polyethyl Glycol-Propyl Glycol 0.4-0.3 % Soln Place 1 drop into both eyes 3 (three) times daily as needed (for dry/irritated eyes.).   rosuvastatin  20 MG tablet Commonly known as: CRESTOR  TAKE 1 TABLET BY MOUTH DAILY   triamcinolone  cream 0.1 % Commonly known as: KENALOG  Apply 1 Application topically 2 (two) times daily.   trimethoprim  100 MG tablet Commonly known as: TRIMPEX  Take 100 mg by mouth at bedtime.   valsartan  40 MG tablet Commonly known as: DIOVAN  TAKE ONE-HALF TABLET BY MOUTH  DAILY               Durable Medical Equipment  (From admission, onward)           Start     Ordered   12/05/23 1525  For home use only DME oxygen  Once       Question Answer Comment  Length of Need 12 Months   Mode or (Route) Nasal cannula   Liters per Minute 2   Frequency Continuous (stationary and portable oxygen unit needed)   Oxygen conserving device Yes   Oxygen delivery system Gas      12/05/23 1525            Outstanding Labs/Studies   none  ______________________  Duration of Discharge Encounter: APP Time: 20 minutes    Signed, Lamarr Hummer, PA-C 12/05/2023, 3:28 PM [phone]   ATTENDING ATTESTATION:  After conducting a review of all available clinical information with the care team, interviewing the patient, and performing a physical exam, I agree with the findings and plan described in this note.   GEN: No acute distress, AO x 3 HEENT:  MMM, no JVD, no scleral icterus Cardiac: RRR, no murmurs, rubs, or gallops.  Respiratory: Clear to auscultation bilaterally. GI: Soft, nontender, non-distended  MS: No edema; No deformity. Neuro:  Nonfocal  Vasc:  +2 radial pulses  Patient doing well after  uncomplicated TAVR.  Patient access sites are stable and she has developed no signs or symptoms of stroke.  She remains hypoxic.  A chest x-ray demonstrated a large hiatal hernia with underexpansion/collapse of  the right lung.  This likely is contributing to the patient's hypoxia.  Will discharge today with supplemental oxygen.  Will arrange outpatient follow-up for TAVR and determine follow-up for hiatal hernia.   APP discharge time:20 MD discharge time:22  Lurena Red, MD Pager [phone]

## 2023-12-04 NOTE — Progress Notes (Signed)
  Echocardiogram 2D Echocardiogram has been performed.  [place]ora [place] R 12/04/2023, 1:20 PM

## 2023-12-04 NOTE — Anesthesia Procedure Notes (Signed)
 Procedure Name: MAC Date/Time: 12/04/2023 12:05 PM  Performed by: Thomasina Laurence GRADE, RNPre-anesthesia Checklist: Patient identified, Emergency Drugs available, Suction available and Patient being monitored Patient Re-evaluated:Patient Re-evaluated prior to induction Oxygen Delivery Method: Simple face mask Dental Injury: Teeth and Oropharynx as per pre-operative assessment

## 2023-12-04 NOTE — Anesthesia Postprocedure Evaluation (Signed)
 Anesthesia Post Note  Patient: Tracey Walton  Procedure(s) Performed: Transcatheter Aortic Valve Replacement, Transfemoral (Right) ECHOCARDIOGRAM, TRANSTHORACIC     Patient location during evaluation: Cath Lab Anesthesia Type: MAC Level of consciousness: awake and alert Pain management: pain level controlled Vital Signs Assessment: post-procedure vital signs reviewed and stable Respiratory status: spontaneous breathing, nonlabored ventilation, respiratory function stable and patient connected to nasal cannula oxygen Cardiovascular status: stable and blood pressure returned to baseline Postop Assessment: no apparent nausea or vomiting Anesthetic complications: no   There were no known notable events for this encounter.  Last Vitals:  Vitals:   12/04/23 1405 12/04/23 1410  BP: (!) 98/55 (!) 101/56  Pulse: 69 70  Resp: (!) 25 (!) 21  Temp:    SpO2: 92% 92%    Last Pain:  Vitals:   12/04/23 1335  TempSrc: Oral  PainSc: 0-No pain                 Garnette FORBES Skillern

## 2023-12-05 ENCOUNTER — Encounter (HOSPITAL_COMMUNITY): Payer: Self-pay | Admitting: Internal Medicine

## 2023-12-05 ENCOUNTER — Inpatient Hospital Stay (HOSPITAL_COMMUNITY)

## 2023-12-05 ENCOUNTER — Inpatient Hospital Stay (HOSPITAL_COMMUNITY)
Admit: 2023-12-05 | Discharge: 2023-12-05 | Disposition: A | Discharge status: Home / Self Care | Attending: Physician Assistant

## 2023-12-05 DIAGNOSIS — Z952 Presence of prosthetic heart valve: Secondary | ICD-10-CM

## 2023-12-05 DIAGNOSIS — I447 Left bundle-branch block, unspecified: Secondary | ICD-10-CM

## 2023-12-05 LAB — ECHOCARDIOGRAM COMPLETE
AR max vel: 2.92 cm2
AV Area VTI: 3.06 cm2
AV Area mean vel: 3.11 cm2
AV Mean grad: 11.7 mmHg
AV Peak grad: 21.7 mmHg
Ao pk vel: 2.33 m/s
Area-P 1/2: 3.6 cm2
Height: 64 in
S' Lateral: 3.1 cm
Weight: 3227.2 [oz_av]

## 2023-12-05 LAB — BASIC METABOLIC PANEL WITH GFR
Anion gap: 11 (ref 5–15)
BUN: 31 mg/dL — ABNORMAL HIGH (ref 8–23)
CO2: 25 mmol/L (ref 22–32)
Calcium: 9 mg/dL (ref 8.9–10.3)
Chloride: 102 mmol/L (ref 98–111)
Creatinine, Ser: 1.09 mg/dL — ABNORMAL HIGH (ref 0.44–1.00)
GFR, Estimated: 51 mL/min — ABNORMAL LOW (ref >60)
Glucose, Bld: 125 mg/dL — ABNORMAL HIGH (ref 70–99)
Potassium: 4.3 mmol/L (ref 3.5–5.1)
Sodium: 138 mmol/L (ref 135–145)

## 2023-12-05 LAB — CBC
HCT: 34.7 % — ABNORMAL LOW (ref 36.0–46.0)
Hemoglobin: 11.5 g/dL — ABNORMAL LOW (ref 12.0–15.0)
MCH: 31.9 pg (ref 26.0–34.0)
MCHC: 33.1 g/dL (ref 30.0–36.0)
MCV: 96.4 fL (ref 80.0–100.0)
Platelets: 208 K/uL (ref 150–400)
RBC: 3.6 MIL/uL — ABNORMAL LOW (ref 3.87–5.11)
RDW: 13.6 % (ref 11.5–15.5)
WBC: 11.6 K/uL — ABNORMAL HIGH (ref 4.0–10.5)
nRBC: 0 % (ref 0.0–0.2)

## 2023-12-05 LAB — MAGNESIUM: Magnesium: 1.8 mg/dL (ref 1.7–2.4)

## 2023-12-05 MED ORDER — ASPIRIN 81 MG PO TBEC: 81.0000 mg | DELAYED_RELEASE_TABLET | Freq: Every day | ORAL | Status: AC

## 2023-12-05 MED ORDER — HYDROCODONE-ACETAMINOPHEN 5-325 MG PO TABS
2.0000 | ORAL_TABLET | ORAL | Status: DC | PRN
  Administered 2023-12-05: 2 via ORAL
  Filled 2023-12-05: qty 2

## 2023-12-05 MED ORDER — FUROSEMIDE 10 MG/ML IJ SOLN
40.0000 mg | Freq: Once | INTRAMUSCULAR | Status: AC
  Administered 2023-12-05: 40 mg via INTRAVENOUS
  Filled 2023-12-05: qty 4

## 2023-12-05 NOTE — Progress Notes (Signed)
 Patient given discharge instructions. PIVs removed. Telemetry box removed, CCMD notified. Patients belongings packed up and taken with patient in wheelchair to vehicle by staff. Oxygen tank delivered and taken with patient.  Braeson Rupe L Shaliyah Taite, RN

## 2023-12-05 NOTE — Progress Notes (Signed)
 Thi morning around 0540, Patient stood at the bedside with walker for more than 5 minutes , sat on the bed and dangle both legs for some minutes, and later used BSC as well, pt couldn't walk a long distance because she was feeling dizzy and her oxygen level dropped below 90 as well. She was steady on her feet throughout , will continue to monitor

## 2023-12-05 NOTE — Progress Notes (Signed)
 Discussed with pt and daughter restrictions, walking for exercise, and CRPII. Receptive, not interested in CRPII as she cares for her great grandchildren.  Checked SpO2 on pts ear and SpO2 98-100 RA. Finger at the same time was 84-91 RA. Pt with does st she has h/o living with a smoker 26 years ago.  [id number] Tracey Walton BS, ACSM-CEP 12/05/2023 12:00 PM

## 2023-12-05 NOTE — Progress Notes (Addendum)
 Patient ambulated on  RA with oxygen saturation with consistent pleth at 85%.  Ambulatory oxygen saturation on 1L with consistent pleth at 88%.  Ambulatory oxygen saturation on 2L with consistent pleth at 94%.

## 2023-12-05 NOTE — Progress Notes (Signed)
 Pts daughter is requesting we change her pain medication from oxy to norco. She takes Norco for chronic pain management at home. Daughter states the oxy makes her out of it. MD notified.  Mykel Mohl L Anyjah Roundtree, RN

## 2023-12-05 NOTE — Progress Notes (Signed)
ZIO AT applied at hospital ?Dr. Ali Lowe to read. ?

## 2023-12-05 NOTE — TOC Transition Note (Signed)
 Transition of Care (TOC) - Discharge Note Rayfield Gobble RN, BSN Inpatient Care Management Unit 4E- RN Case Manager See Treatment Team for direct phone #   Patient Details  Name: Tracey Walton MRN: [id number] Date of Birth: 10-20-1940  Transition of Care [facility]) CM/SW Contact:  Gobble Rayfield Hurst, RN Phone Number: 12/05/2023, 3:48 PM   Clinical Narrative:    Pt s/p TAVR, stable for transition home, Zio patch has been placed per Cards.  Per ambulating 02 check- pt will need home 02- order placed.   CM in to speak with pt at bedside- daughter Madeline also present- who lives with pt.  Dicussed home 02 needs- and choice offered for home-02 provider- pt voiced she does not have a preference- just whichever one can get portable here the quickest for discharge home. Defers to this CM to secure agency.  Pt confirmed she has RW at home- no other DME needs noted.   Daughter at bedside to transport home once portable home 02 delivered.   Address, phone # and PCP all confirmed.  Alternate contact will be daughter Madeline- cell #- [phone]  Call made to [employer] liaison- referral accepted and deliver of portable 02 ETA within the hour or sooner.   No further IP CM needs noted.    Final next level of care: Home/Self Care Barriers to Discharge: No Barriers Identified   Patient Goals and CMS Choice Patient states their goals for this hospitalization and ongoing recovery are:: return home   Choice offered to / list presented to : Patient      Discharge Placement               Home         Discharge Plan and Services Additional resources added to the After Visit Summary for     Discharge Planning Services: CM Consult Post Acute Care Choice: Durable Medical Equipment          DME Arranged: Oxygen DME Agency: [facility] Date DME Agency Contacted: 12/05/23 Time DME Agency Contacted: 1547 Representative spoke with at DME Agency: [facility] Arranged: NA HH Agency: NA         Social Drivers of Health (SDOH) Interventions SDOH Screenings   Food Insecurity: No Food Insecurity (12/04/2023)  Housing: Unknown (12/04/2023)  Transportation Needs: No Transportation Needs (12/04/2023)  Utilities: Not At Risk (12/04/2023)  Alcohol Screen: Low Risk  (05/03/2023)  Depression (PHQ2-9): Medium Risk (09/06/2023)  Financial Resource Strain: Low Risk  (09/04/2023)  Physical Activity: Inactive (09/04/2023)  Social Connections: Unknown (12/04/2023)  Stress: No Stress Concern Present (09/04/2023)  Tobacco Use: Low Risk  (12/04/2023)  Health Literacy: Adequate Health Literacy (05/03/2023)     Readmission Risk Interventions    12/05/2023    3:48 PM  Readmission Risk Prevention Plan  Post Dischage Appt Complete  Medication Screening Complete  Transportation Screening Complete

## 2023-12-06 ENCOUNTER — Telehealth: Payer: Self-pay

## 2023-12-06 NOTE — Transitions of Care (Post Inpatient/ED Visit) (Signed)
 12/06/2023  Name: Tracey Walton MRN: [id number] DOB: 07-29-1940  Today's TOC FU Call Status: Today's TOC FU Call Status:: Successful TOC FU Call Completed TOC FU Call Complete Date: 12/06/23 Patient's Name and Date of Birth confirmed.  Transition Care Management Follow-up Telephone Call Date of Discharge: 12/05/23 Discharge Facility: [facility]) Type of Discharge: Inpatient Admission Primary Inpatient Discharge Diagnosis:: s/p TAVR How have you been since you were released from the hospital?: Better Any questions or concerns?: No  Items Reviewed: Did you receive and understand the discharge instructions provided?: Yes Medications obtained,verified, and reconciled?: Yes (Medications Reviewed) Any new allergies since your discharge?: No Dietary orders reviewed?: NA Do you have support at home?: Yes People in Home [RPT]: child(ren), adult  Medications Reviewed Today: Medications Reviewed Today     Reviewed by Rumalda Alan PENNER, RN (Registered Nurse) on 12/06/23 at 1043  Med List Status: <None>   Medication Order Taking? Sig Documenting Provider Last Dose Status Informant  acetaminophen  (TYLENOL ) 325 MG tablet [id number] Yes Take 650 mg by mouth 4 (four) times daily as needed for moderate pain (pain score 4-6). [provider]  Active Self  amLODipine  (NORVASC ) 5 MG tablet [id number] Yes TAKE 1 TABLET BY MOUTH DAILY Nahser, Aleene PARAS, MD  Active Self  aspirin  EC 81 MG tablet [id number] Yes Take 1 tablet (81 mg total) by mouth daily. Swallow whole. Sebastian Lamarr SAUNDERS, PA-C  Active   baclofen  (LIORESAL ) 10 MG tablet [id number] Yes Take 10 mg by mouth daily as needed for muscle spasms. [provider]  Active Self  carvedilol  (COREG ) 6.25 MG tablet [id number] Yes TAKE 1 TABLET BY MOUTH TWICE  DAILY Nahser, Aleene PARAS, MD  Active Self  cetirizine (ZYRTEC) 10 MG tablet [id number] Yes Take 10 mg by mouth daily. [provider]  Active Self  coal tar-salicylic acid  2  % shampoo [id number] Yes Apply topically daily as needed for itching. Wallace Joesph LABOR, PA  Active Self  ferrous sulfate  325 (65 FE) MG tablet [id number] Yes Take 1 tablet (325 mg total) by mouth daily with breakfast. Take on Monday, Wednesday, Fridays Jonel Pee D, NP  Active Self  FLUoxetine  (PROZAC ) 20 MG capsule [id number] Yes Take 1 capsule (20 mg total) by mouth daily. Wallace Joesph LABOR, PA  Active Self  fluticasone  (FLONASE ) 50 MCG/ACT nasal spray [id number] Yes Place 2 sprays into both nostrils daily. Wallace Joesph LABOR, PA  Active Self  HYDROcodone -acetaminophen  (NORCO/VICODIN) 5-325 MG tablet [id number] Yes Take 2 tablets by mouth every 4 (four) hours as needed for moderate pain (pain score 4-6). [provider]  Active Self  naproxen  (NAPROSYN ) 500 MG tablet [id number]  TAKE 1 TABLET BY MOUTH TWICE  DAILY WITH MEALS  Patient not taking: Reported on 12/06/2023   Abonza, Maritza, PA-C  Active Self  omeprazole  (PRILOSEC) 20 MG capsule [id number] Yes TAKE 1 CAPSULE BY MOUTH DAILY Wallace Joesph LABOR, PA  Active Self  Polyethyl Glycol-Propyl Glycol 0.4-0.3 % SOLN [id number] Yes Place 1 drop into both eyes 3 (three) times daily as needed (for dry/irritated eyes.). [provider]  Active Self  rosuvastatin  (CRESTOR ) 20 MG tablet [id number] Yes TAKE 1 TABLET BY MOUTH DAILY Nahser, Aleene PARAS, MD  Active Self  triamcinolone  cream (KENALOG ) 0.1 % [id number] Yes Apply 1 Application topically 2 (two) times daily. Wallace Joesph LABOR, PA  Active Self  trimethoprim  (TRIMPEX ) 100 MG tablet [id number] Yes Take 100 mg by mouth at bedtime. [provider]  Active Self  valsartan  (  DIOVAN ) 40 MG tablet [id number] Yes TAKE ONE-HALF TABLET BY MOUTH  DAILY Chandra Toribio POUR, MD  Active Self           Today's Vitals   12/06/23 1051 12/06/23 1052  BP:  (!) 102/52  Pulse:  81  SpO2:  91%  PainSc: 2       Home Care and Equipment/Supplies: Were Home Health Services Ordered?: No Any new  equipment or medical supplies ordered?: Yes Name of Medical supply agency?: [employer] Do you have any questions related to the use of the equipment/supplies?: No  Functional Questionnaire: Do you need assistance with bathing/showering or dressing?: No Do you need assistance with meal preparation?: No Do you need assistance with eating?: No Do you have difficulty maintaining continence: No Do you need assistance with getting out of bed/getting out of a chair/moving?: No Do you have difficulty managing or taking your medications?: No  Follow up appointments reviewed: PCP Follow-up appointment confirmed?: No (Will call today and she know about transportation) MD Provider Line [phone] Given: No Specialist Hospital Follow-up appointment confirmed?: Yes Date of Specialist follow-up appointment?: 12/13/23 Follow-Up Specialty Provider:: cardiology Do you need transportation to your follow-up appointment?: No Do you understand care options if your condition(s) worsen?: Yes-patient verbalized understanding  SDOH Interventions Today    Flowsheet Row Most Recent Value  SDOH Interventions   Food Insecurity Interventions Intervention Not Indicated  Housing Interventions Intervention Not Indicated  Transportation Interventions Intervention Not Indicated  Utilities Interventions Intervention Not Indicated   Call with patient today. She reports that she is doing well. Reports that she has her oxygen but is not currently wearing it.  Encouraged patient to check her pulse ox and it is 91%.  Reviewed importance of patient wearing her oxygen all the time.  She put it back on.   Patient reports chronic pain and denies any changes in pain today. States she had an episodes of reflux last night after eating chocolate candy. Reports she took tums and it went away.  Denies any chest pain at this time. Reviewed groin incisions and she reports no signs of infection. Reports she is wearing her  heart monitor and it was put on yesterday at 4pm. Reviewed all discharge instructions and medications. Reviewed fall prevention Review bleeding precautions. Reviewed with patient when to call 911 for chest pain or shortness of breath.  Encouraged patient to call PCP office and schedule office visit. She reports she will but needs to find out when she will have transportation. Reviewed and offered 30 day TOC program and patient declined. Reports granddaughter is a Engineer, civil (consulting) and she is assisting. Provided my contact information and encouraged patient to call me if needed.   Alan Ee, RN, BSN, CEN [employer]- Transition of Care Team.  [facility] [phone]

## 2023-12-06 NOTE — Telephone Encounter (Signed)
 Patient contacted regarding discharge from [facility] on 12/05/23.  Patient understands to follow up with provider Izetta Hummer, PA-C on 12/13/23 at 1:30PM at [address] Location. Patient understands discharge instructions? Yes Patient understands medications and regiment? Yes Patient understands to bring all medications to this visit? Yes

## 2023-12-07 ENCOUNTER — Encounter (HOSPITAL_COMMUNITY): Payer: Self-pay

## 2023-12-07 ENCOUNTER — Emergency Department (HOSPITAL_COMMUNITY)
Admission: EM | Admit: 2023-12-07 | Discharge: 2023-12-07 | Disposition: A | Discharge status: Home / Self Care | Attending: Emergency Medicine | Admitting: Emergency Medicine

## 2023-12-07 ENCOUNTER — Emergency Department (HOSPITAL_COMMUNITY)

## 2023-12-07 ENCOUNTER — Other Ambulatory Visit: Payer: Self-pay

## 2023-12-07 DIAGNOSIS — N189 Chronic kidney disease, unspecified: Secondary | ICD-10-CM | POA: Insufficient documentation

## 2023-12-07 DIAGNOSIS — Z7982 Long term (current) use of aspirin: Secondary | ICD-10-CM | POA: Diagnosis not present

## 2023-12-07 DIAGNOSIS — R9389 Abnormal findings on diagnostic imaging of other specified body structures: Secondary | ICD-10-CM | POA: Diagnosis not present

## 2023-12-07 DIAGNOSIS — I517 Cardiomegaly: Secondary | ICD-10-CM | POA: Diagnosis not present

## 2023-12-07 DIAGNOSIS — R0602 Shortness of breath: Secondary | ICD-10-CM | POA: Insufficient documentation

## 2023-12-07 DIAGNOSIS — K449 Diaphragmatic hernia without obstruction or gangrene: Secondary | ICD-10-CM | POA: Diagnosis not present

## 2023-12-07 DIAGNOSIS — R112 Nausea with vomiting, unspecified: Secondary | ICD-10-CM | POA: Diagnosis not present

## 2023-12-07 DIAGNOSIS — R600 Localized edema: Secondary | ICD-10-CM | POA: Insufficient documentation

## 2023-12-07 DIAGNOSIS — R531 Weakness: Secondary | ICD-10-CM | POA: Diagnosis not present

## 2023-12-07 DIAGNOSIS — R110 Nausea: Secondary | ICD-10-CM | POA: Diagnosis not present

## 2023-12-07 LAB — COMPREHENSIVE METABOLIC PANEL WITH GFR
ALT: 8 U/L (ref 0–44)
AST: 19 U/L (ref 15–41)
Albumin: 3.3 g/dL — ABNORMAL LOW (ref 3.5–5.0)
Alkaline Phosphatase: 61 U/L (ref 38–126)
Anion gap: 12 (ref 5–15)
BUN: 34 mg/dL — ABNORMAL HIGH (ref 8–23)
CO2: 24 mmol/L (ref 22–32)
Calcium: 9.2 mg/dL (ref 8.9–10.3)
Chloride: 101 mmol/L (ref 98–111)
Creatinine, Ser: 1.24 mg/dL — ABNORMAL HIGH (ref 0.44–1.00)
GFR, Estimated: 43 mL/min — ABNORMAL LOW (ref >60)
Glucose, Bld: 118 mg/dL — ABNORMAL HIGH (ref 70–99)
Potassium: 3.7 mmol/L (ref 3.5–5.1)
Sodium: 137 mmol/L (ref 135–145)
Total Bilirubin: 0.9 mg/dL (ref 0.0–1.2)
Total Protein: 7.1 g/dL (ref 6.5–8.1)

## 2023-12-07 LAB — CBC WITH DIFFERENTIAL/PLATELET
Abs Immature Granulocytes: 0.03 K/uL (ref 0.00–0.07)
Basophils Absolute: 0 K/uL (ref 0.0–0.1)
Basophils Relative: 0 %
Eosinophils Absolute: 0.1 K/uL (ref 0.0–0.5)
Eosinophils Relative: 1 %
HCT: 36.2 % (ref 36.0–46.0)
Hemoglobin: 11.6 g/dL — ABNORMAL LOW (ref 12.0–15.0)
Immature Granulocytes: 0 %
Lymphocytes Relative: 9 %
Lymphs Abs: 0.8 K/uL (ref 0.7–4.0)
MCH: 31.2 pg (ref 26.0–34.0)
MCHC: 32 g/dL (ref 30.0–36.0)
MCV: 97.3 fL (ref 80.0–100.0)
Monocytes Absolute: 0.6 K/uL (ref 0.1–1.0)
Monocytes Relative: 7 %
Neutro Abs: 7.6 K/uL (ref 1.7–7.7)
Neutrophils Relative %: 83 %
Platelets: 146 K/uL — ABNORMAL LOW (ref 150–400)
RBC: 3.72 MIL/uL — ABNORMAL LOW (ref 3.87–5.11)
RDW: 13.5 % (ref 11.5–15.5)
WBC: 9.3 K/uL (ref 4.0–10.5)
nRBC: 0 % (ref 0.0–0.2)

## 2023-12-07 LAB — URINALYSIS, ROUTINE W REFLEX MICROSCOPIC
Bilirubin Urine: NEGATIVE
Glucose, UA: NEGATIVE mg/dL
Hgb urine dipstick: NEGATIVE
Ketones, ur: NEGATIVE mg/dL
Leukocytes,Ua: NEGATIVE
Nitrite: NEGATIVE
Protein, ur: NEGATIVE mg/dL
Specific Gravity, Urine: 1.01 (ref 1.005–1.030)
pH: 5 (ref 5.0–8.0)

## 2023-12-07 LAB — TROPONIN I (HIGH SENSITIVITY)
Troponin I (High Sensitivity): 202 ng/L (ref <18)
Troponin I (High Sensitivity): 172 ng/L (ref <18)

## 2023-12-07 LAB — BRAIN NATRIURETIC PEPTIDE: B Natriuretic Peptide: 117.2 pg/mL — ABNORMAL HIGH (ref 0.0–100.0)

## 2023-12-07 LAB — LIPASE, BLOOD: Lipase: 33 U/L (ref 11–51)

## 2023-12-07 MED ORDER — ONDANSETRON HCL 4 MG/2ML IJ SOLN
4.0000 mg | Freq: Once | INTRAMUSCULAR | Status: AC
  Administered 2023-12-07: 4 mg via INTRAVENOUS
  Filled 2023-12-07: qty 2

## 2023-12-07 NOTE — ED Triage Notes (Signed)
 Pt to ED via EMS with c/o generalized weakness. +N/V today. Pt had aortic valve repair on Tuesday. Pt took 4 5mg  vicodin yesterday at 1800. Pt A&Ox4. Pt wears 2L nasal cannula baseline.

## 2023-12-07 NOTE — ED Provider Notes (Signed)
  EMERGENCY DEPARTMENT AT [facility] Provider Note   CSN: [id number] Arrival date & time: 12/07/23  9142     Patient presents with: Weakness   Tracey Walton is a 83 y.o. female.   Patient with general lysed weakness nausea and vomiting and some shortness of breath.  She states that she has been on 2 L of oxygen since she was discharged from the hospital a few days ago after having aortic valve repair via TAVR.  Is not on any anticoagulation.  She thinks that she has been maybe overdoing it with her pain medicine.  She has felt weak.  She is felt nauseous.  She has history of high cholesterol CKD anxiety.  She has not noticed any major leg swelling.  She states she is not on any anticoagulation.  Is not on any fluid pills she states.  She denies any cough or fever.  The history is provided by the patient.       Prior to Admission medications   Medication Sig Start Date End Date Taking? Authorizing Provider  acetaminophen  (TYLENOL ) 325 MG tablet Take 650 mg by mouth 4 (four) times daily as needed for moderate pain (pain score 4-6).    [provider]  amLODipine  (NORVASC ) 5 MG tablet TAKE 1 TABLET BY MOUTH DAILY 08/29/23   Nahser, Aleene PARAS, MD  aspirin  EC 81 MG tablet Take 1 tablet (81 mg total) by mouth daily. Swallow whole. 12/05/23 12/04/24  Sebastian Lamarr SAUNDERS, PA-C  baclofen  (LIORESAL ) 10 MG tablet Take 10 mg by mouth daily as needed for muscle spasms. 08/09/23   [provider]  carvedilol  (COREG ) 6.25 MG tablet TAKE 1 TABLET BY MOUTH TWICE  DAILY 08/03/23   Nahser, Aleene PARAS, MD  cetirizine (ZYRTEC) 10 MG tablet Take 10 mg by mouth daily.    [provider]  coal tar-salicylic acid  2 % shampoo Apply topically daily as needed for itching. 01/26/23   Wallace Joesph LABOR, PA  ferrous sulfate  325 (65 FE) MG tablet Take 1 tablet (325 mg total) by mouth daily with breakfast. Take on Monday, Wednesday, Fridays 03/14/18   Jonel Pee D, NP  FLUoxetine   (PROZAC ) 20 MG capsule Take 1 capsule (20 mg total) by mouth daily. 01/26/23   Wallace Joesph LABOR, PA  fluticasone  (FLONASE ) 50 MCG/ACT nasal spray Place 2 sprays into both nostrils daily. 01/26/23   Wallace Joesph LABOR, PA  HYDROcodone -acetaminophen  (NORCO/VICODIN) 5-325 MG tablet Take 2 tablets by mouth every 4 (four) hours as needed for moderate pain (pain score 4-6). 02/15/23   [provider]  naproxen  (NAPROSYN ) 500 MG tablet TAKE 1 TABLET BY MOUTH TWICE  DAILY WITH MEALS Patient not taking: Reported on 12/06/2023 12/21/21   Abonza, Maritza, PA-C  omeprazole  (PRILOSEC) 20 MG capsule TAKE 1 CAPSULE BY MOUTH DAILY 05/21/23   Wallace Joesph LABOR, PA  Polyethyl Glycol-Propyl Glycol 0.4-0.3 % SOLN Place 1 drop into both eyes 3 (three) times daily as needed (for dry/irritated eyes.).    [provider]  rosuvastatin  (CRESTOR ) 20 MG tablet TAKE 1 TABLET BY MOUTH DAILY 08/29/23   Nahser, Aleene PARAS, MD  triamcinolone  cream (KENALOG ) 0.1 % Apply 1 Application topically 2 (two) times daily. 01/26/23   Wallace Joesph LABOR, PA  trimethoprim  (TRIMPEX ) 100 MG tablet Take 100 mg by mouth at bedtime.    [provider]  valsartan  (DIOVAN ) 40 MG tablet TAKE ONE-HALF TABLET BY MOUTH  DAILY 09/06/23   Chandra Toribio POUR, MD  Allergies: Penicillins    Review of Systems  Updated Vital Signs BP (!) 124/90   Pulse 82   Temp 98.1 F (36.7 C) (Oral)   Resp (!) 22   Ht 5' 4 (1.626 m)   Wt 90.7 kg   SpO2 96%   BMI 34.33 kg/m   Physical Exam Vitals and nursing note reviewed.  Constitutional:      General: She is not in acute distress.    Appearance: She is well-developed. She is not ill-appearing.  HENT:     Head: Normocephalic and atraumatic.     Nose: Nose normal.     Mouth/Throat:     Mouth: Mucous membranes are moist.  Eyes:     Extraocular Movements: Extraocular movements intact.     Conjunctiva/sclera: Conjunctivae normal.     Pupils: Pupils are equal, round, and reactive to  light.  Cardiovascular:     Rate and Rhythm: Normal rate and regular rhythm.     Pulses: Normal pulses.     Heart sounds: Normal heart sounds. No murmur heard. Pulmonary:     Effort: Pulmonary effort is normal. No respiratory distress.     Breath sounds: Normal breath sounds.  Abdominal:     Palpations: Abdomen is soft.     Tenderness: There is no abdominal tenderness.  Musculoskeletal:        General: No swelling.     Cervical back: Normal range of motion and neck supple.     Right lower leg: Edema present.     Left lower leg: Edema present.     Comments: Trace edema both legs bilaterally  Skin:    General: Skin is warm and dry.     Capillary Refill: Capillary refill takes less than 2 seconds.  Neurological:     General: No focal deficit present.     Mental Status: She is alert and oriented to person, place, and time.     Cranial Nerves: No cranial nerve deficit.     Sensory: No sensory deficit.     Motor: No weakness.     Coordination: Coordination normal.  Psychiatric:        Mood and Affect: Mood normal.     (all labs ordered are listed, but only abnormal results are displayed) Labs Reviewed  CBC WITH DIFFERENTIAL/PLATELET - Abnormal; Notable for the following components:      Result Value   RBC 3.72 (*)    Hemoglobin 11.6 (*)    Platelets 146 (*)    All other components within normal limits  COMPREHENSIVE METABOLIC PANEL WITH GFR - Abnormal; Notable for the following components:   Glucose, Bld 118 (*)    BUN 34 (*)    Creatinine, Ser 1.24 (*)    Albumin 3.3 (*)    GFR, Estimated 43 (*)    All other components within normal limits  BRAIN NATRIURETIC PEPTIDE - Abnormal; Notable for the following components:   B Natriuretic Peptide 117.2 (*)    All other components within normal limits  TROPONIN I (HIGH SENSITIVITY) - Abnormal; Notable for the following components:   Troponin I (High Sensitivity) 202 (*)    All other components within normal limits  TROPONIN I  (HIGH SENSITIVITY) - Abnormal; Notable for the following components:   Troponin I (High Sensitivity) 172 (*)    All other components within normal limits  LIPASE, BLOOD  URINALYSIS, ROUTINE W REFLEX MICROSCOPIC  BLOOD TRANSFUSION REPORT - SCANNED   Narrative:    Ordered by an  unspecified provider.    EKG: EKG Interpretation Date/Time:  Friday December 07 2023 09:13:18 EDT Ventricular Rate:  89 PR Interval:  44 QRS Duration:  162 QT Interval:  411 QTC Calculation: 501 R Axis:   -33  Text Interpretation: Sinus rhythm Short PR interval Left bundle branch block Confirmed by Ruthe Cornet [phone]) on 12/07/2023 9:18:34 AM  Radiology: [facility] Chest Portable 1 View Result Date: 12/07/2023 CLINICAL DATA:  weakness EXAM: PORTABLE CHEST - 1 VIEW COMPARISON:  December 05, 2023 FINDINGS: Similar elevation of the right hemidiaphragm. No focal airspace consolidation, pleural effusion, or pneumothorax. Mild cardiomegaly. Large hiatal hernia. Endovascular aortic valve replacement. Cardiac loop recorder device. Tortuous aorta. No acute fracture or destructive lesions. Multilevel thoracic osteophytosis. Bilateral glenohumeral joint arthroplasties again noted. IMPRESSION: Similar elevation of the right hemidiaphragm. Otherwise, no acute cardiopulmonary abnormality. Electronically Signed   By: Rogelia Myers M.D.   On: 12/07/2023 10:07     Procedures   Medications Ordered in the ED  ondansetron  (ZOFRAN ) injection 4 mg (4 mg Intravenous Given 12/07/23 0949)                                    Medical Decision Making Amount and/or Complexity of Data Reviewed Labs: ordered. Radiology: ordered.  Risk Prescription drug management.   Tracey Walton is here with generalized weakness, shortness of breath, nausea.  She just had aortic valve replacement a couple days ago.  She has been home for about 2 days.  She states she thinks that she might have been overdoing it with her pain medicine making her  nauseous and weak.  She felt a little bit short of breath this morning.  She denies any chest pain abdominal pain.  She denies any major leg swelling.  She states she is not on any anticoagulation or fluid pills.  She overall seems well but a little bit uncapped.  Skin little bit of edema in her legs.  Fairly clear breath sounds.  Seems like she has been on 2 L of oxygen since discharge.  Overall differential diagnosis could be polypharmacy as she does suspect that maybe the pain medicines making her feel weaker than normal but will evaluate for any ACS or volume overload or anemia or electrolyte abnormality.  Her vital signs overall appear unremarkable.  She looks like she has a EKG with sinus rhythm with left bundle branch she is wearing a Zio patch because she developed this bundle branch after the surgery.  There is some artifact on EKG.  Overall we will look for infectious process as well.  Overall lab work is reassuring.  Troponin was 200 and then 172.  Likely expected given recent valvular surgery.  Urinalysis negative for infection.  No significant anemia or leukocytosis.  Unremarkable creatinine.  BNP was 117.  Chest x-ray with no signs of volume overload or pneumonia.  Overall I talked with Dr. Mona with cardiology.  Agrees stable for discharge home.  Troponin elevation likely in the setting of recent surgery.  Is not having any chest pain.  EKG was reassuring.  I have no concern for PE at this time.  She is not tachycardic.  She is resting comfortably here on exam.  Overall gave strict return precautions with family.  We talked about how I thought this is likely expected postop process.  I have very low suspicion for PE and we discussed that.  I did tell  them to monitor for fever, worsening respiratory status.  If that occurs to return for evaluation.  But I think the focus should be on trying to wean off of the narcotic pain medicine to see if that is playing a role but overall I expect that this is  likely expected fatigue and weakness from recent surgery.  Family comfortable with discharge.  Patient hemodynamically stable throughout my care.  Discharge.  Urinalysis was also negative for infection.  This chart was dictated using voice recognition software.  Despite best efforts to proofread,  errors can occur which can change the documentation meaning.      Final diagnoses:  Weakness    ED Discharge Orders     None          Ruthe Cornet, DO 12/07/23 1229

## 2023-12-07 NOTE — ED Notes (Signed)
 X-ray at bedside.

## 2023-12-07 NOTE — Discharge Instructions (Signed)
 Please continue to monitor for any respiratory distress at home and fever or other concerning symptoms.  Workup today is unremarkable.  Follow-up with your cardiologist as scheduled.  Try to wean off of narcotic pain medicine.

## 2023-12-10 ENCOUNTER — Ambulatory Visit: Admitting: Physician Assistant

## 2023-12-11 ENCOUNTER — Other Ambulatory Visit: Payer: Self-pay | Admitting: Family Medicine

## 2023-12-11 ENCOUNTER — Encounter: Payer: Self-pay | Admitting: Family Medicine

## 2023-12-11 MED ORDER — FLUOXETINE HCL 10 MG PO CAPS: 10.0000 mg | ORAL_CAPSULE | Freq: Every day | ORAL | 1 refills | Status: DC

## 2023-12-13 ENCOUNTER — Ambulatory Visit: Attending: Physician Assistant | Admitting: Physician Assistant

## 2023-12-13 VITALS — BP 142/78 | HR 77 | Ht 64.0 in | Wt 194.2 lb

## 2023-12-13 DIAGNOSIS — E782 Mixed hyperlipidemia: Secondary | ICD-10-CM | POA: Diagnosis not present

## 2023-12-13 DIAGNOSIS — R0902 Hypoxemia: Secondary | ICD-10-CM | POA: Diagnosis not present

## 2023-12-13 DIAGNOSIS — I447 Left bundle-branch block, unspecified: Secondary | ICD-10-CM

## 2023-12-13 DIAGNOSIS — Z952 Presence of prosthetic heart valve: Secondary | ICD-10-CM | POA: Diagnosis not present

## 2023-12-13 DIAGNOSIS — I10 Essential (primary) hypertension: Secondary | ICD-10-CM

## 2023-12-13 DIAGNOSIS — N1831 Chronic kidney disease, stage 3a: Secondary | ICD-10-CM | POA: Diagnosis not present

## 2023-12-13 DIAGNOSIS — K449 Diaphragmatic hernia without obstruction or gangrene: Secondary | ICD-10-CM | POA: Diagnosis not present

## 2023-12-13 MED ORDER — AZITHROMYCIN 500 MG PO TABS: 500.0000 mg | ORAL_TABLET | ORAL | 12 refills | Status: AC

## 2023-12-13 NOTE — Patient Instructions (Signed)
 Medication Instructions:  Your physician has recommended you make the following change in your medication: Discontinue Oxygen.  Start Azithromycin   500mg , take 1 tablet by mouth 1 hour prior to dental procedures and cleanings.   *If you need a refill on your cardiac medications before your next appointment, please call your pharmacy*  Lab Work: None needed If you have labs (blood work) drawn today and your tests are completely normal, you will receive your results only by: MyChart Message (if you have MyChart) OR A paper copy in the mail If you have any lab test that is abnormal or we need to change your treatment, we will call you to review the results.  Testing/Procedures: 01/14/24 Your physician has requested that you have an echocardiogram. Echocardiography is a painless test that uses sound waves to create images of your heart. It provides your doctor with information about the size and shape of your heart and how well your heart's chambers and valves are working. This procedure takes approximately one hour. There are no restrictions for this procedure. Please do NOT wear cologne, perfume, aftershave, or lotions (deodorant is allowed). Please arrive 15 minutes prior to your appointment time.  Please note: We ask at that you not bring children with you during ultrasound (echo/ vascular) testing. Due to room size and safety concerns, children are not allowed in the ultrasound rooms during exams. Our front office staff cannot provide observation of children in our lobby area while testing is being conducted. An adult accompanying a patient to their appointment will only be allowed in the ultrasound room at the discretion of the ultrasound technician under special circumstances. We apologize for any inconvenience.   Follow-Up: At [facility], you and your health needs are our priority.  As part of our continuing mission to provide you with exceptional heart care, our providers are all  part of one team.  This team includes your primary Cardiologist (physician) and Advanced Practice Providers or APPs (Physician Assistants and Nurse Practitioners) who all work together to provide you with the care you need, when you need it.  Your next appointment:   As scheduled  Provider:   Izetta Hummer, PA-C    We recommend signing up for the patient portal called MyChart.  Sign up information is provided on this After Visit Summary.  MyChart is used to connect with patients for Virtual Visits (Telemedicine).  Patients are able to view lab/test results, encounter notes, upcoming appointments, etc.  Non-urgent messages can be sent to your provider as well.   To learn more about what you can do with MyChart, go to [email or web address].

## 2023-12-13 NOTE — Progress Notes (Signed)
 HEART AND VASCULAR CENTER   MULTIDISCIPLINARY HEART VALVE CLINIC                                     Cardiology Office Note:    Date:  12/13/2023   ID:  Tracey Walton, DOB Nov 06, 1940, MRN [id number]  PCP:  Chandra Toribio POUR, MD  [facility] Cardiologist:  Lurena POUR Red, MD  [facility] Structural heart: Lurena POUR Red, MD [facility] Electrophysiologist:  None   Referring MD: Chandra Toribio POUR, MD   [facility] s/p TAVR  History of Present Illness:    Tracey Walton is a 83 y.o. female with a hx of HTN, HLD, CKD stage IIIa, and severe AS s/p TAVR 12/04/23 who presents to clinic for follow up.    She developed chest pressure and exertional shortness of breath. Echo 09/06/23 showed EF 60% and severe AS with a mean grad 46 mmHg, AVA 1.44 cm2 (not felt to be accurate). [facility] 10/19/23 showed single-vessel ostial diagonal disease which will be treated medically. S/p successful TAVR with a 26 mm Edwards Sapien 3 Ultra Resilia THV via the TF approach on 12/04/23. Post operative echo showed EF 65%, mild RVE, normally functioning TAVR with a mean gradient of 13 mmHg and no PVL. Noted to have some hypoxia and discharged with home 02.   Seen 12/07/23 in ER for nausea and vomitting felt to be related to taking too many pain pills.  Today the patient presents to clinic for follow up. Here with daughter. Doing great. No CP or SOB. No LE edema, orthopnea or PND. No dizziness or syncope. No blood in stool or urine. No palpitations. Does not want oxygen anymore    Past Medical History:  Diagnosis Date   Anxiety    Arthritis    qwhere (08/09/2017)   Chest pain 2011   negative for MI   Chronic kidney disease    renal insufficiency. 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 down after her knee surgery back in 2008   Chronic lower back pain    Depression    GERD (gastroesophageal reflux disease)    History of hiatal hernia    Hyperlipidemia    Hypertension    Osteoporosis    Polymyalgia (HCC)    S/P TAVR  (transcatheter aortic valve replacement) 12/04/2023   s/p TAVR with a 26 mm Edwards Sapien 3 Ultra Resilia THV via the TF approach by Dr. Red and Dr. Shyrl   Severe aortic stenosis    Vertigo      Current Medications: Current Meds  Medication Sig   amLODipine  (NORVASC ) 5 MG tablet TAKE 1 TABLET BY MOUTH DAILY   aspirin  EC 81 MG tablet Take 1 tablet (81 mg total) by mouth daily. Swallow whole.   azithromycin  (ZITHROMAX ) 500 MG tablet Take 1 tablet (500 mg total) by mouth as directed. Take one tablet 1 hour before any dental work including cleanings.   baclofen  (LIORESAL ) 10 MG tablet Take 10 mg by mouth daily as needed for muscle spasms.   carvedilol  (COREG ) 6.25 MG tablet TAKE 1 TABLET BY MOUTH TWICE  DAILY   cetirizine (ZYRTEC) 10 MG tablet Take 10 mg by mouth daily.   FLUoxetine  (PROZAC ) 10 MG capsule Take 1 capsule (10 mg total) by mouth daily.   FLUoxetine  (PROZAC ) 20 MG capsule Take 1 capsule (20 mg total) by mouth daily.   fluticasone  (FLONASE ) 50 MCG/ACT nasal  spray Place 2 sprays into both nostrils daily.   HYDROcodone -acetaminophen  (NORCO/VICODIN) 5-325 MG tablet Take 2 tablets by mouth every 4 (four) hours as needed for moderate pain (pain score 4-6).   omeprazole  (PRILOSEC) 20 MG capsule TAKE 1 CAPSULE BY MOUTH DAILY   Polyethyl Glycol-Propyl Glycol 0.4-0.3 % SOLN Place 1 drop into both eyes 3 (three) times daily as needed (for dry/irritated eyes.).   rosuvastatin  (CRESTOR ) 20 MG tablet TAKE 1 TABLET BY MOUTH DAILY   trimethoprim  (TRIMPEX ) 100 MG tablet Take 100 mg by mouth at bedtime.   valsartan  (DIOVAN ) 40 MG tablet TAKE ONE-HALF TABLET BY MOUTH  DAILY      ROS:   Please see the history of present illness.    All other systems reviewed and are negative.  EKGs   EKG Interpretation Date/Time:  Thursday December 13 2023 13:42:56 EDT Ventricular Rate:  77 PR Interval:  194 QRS Duration:  108 QT Interval:  382 QTC Calculation: 432 R Axis:   -15  Text  Interpretation: Normal sinus rhythm Moderate voltage criteria for LVH, may be normal variant Cannot rule out Anterior infarct , age undetermined Confirmed by Sebastian Collar [phone]) on 12/13/2023 1:55:55 PM   Risk Assessment/Calculations:          Physical Exam:    VS:  BP (!) 142/78   Pulse 77   Ht 5' 4 (1.626 m)   Wt 194 lb 3.2 oz (88.1 kg)   SpO2 96%   BMI 33.33 kg/m     Wt Readings from Last 3 Encounters:  12/13/23 194 lb 3.2 oz (88.1 kg)  12/07/23 200 lb (90.7 kg)  12/05/23 201 lb 11.2 oz (91.5 kg)     GEN: Well nourished, well developed in no acute distress NECK: No JVD CARDIAC: RRR, soft flow murmur. no rubs, gallops RESPIRATORY:  Clear to auscultation without rales, wheezing or rhonchi  ABDOMEN: Soft, non-tender, non-distended EXTREMITIES:  No edema; No deformity.  Groin sites clear without hematoma or ecchymosis.   ASSESSMENT:    1. S/P TAVR (transcatheter aortic valve replacement)   2. LBBB (left bundle branch block)   3. Essential hypertension   4. Mixed hyperlipidemia   5. Stage 3a chronic kidney disease (HCC)   6. Hypoxia   7. Hiatal hernia     PLAN:    In order of problems listed above:  Severe AS s/p TAVR:  -- Pt doing well s/p TAVR.  -- ECG with no HAVB.  -- Groin sites healing well.  -- SBE prophylaxis discussed; I have RX'd azithromycin  due to a PCN allergy.   -- Continue Aspirin  81mg  daily. -- Cleared to resume all activities without restriction. -- I will see back for 1 month echo and OV.  New LBBB: -- Developed a new LBBB after TAVR.  -- Zio with no high risk alerts.    HTN: -- BP well controlled.  -- Continue amlodipine  5mg  daily. -- Continue carvedolol 6.25mg  BID. -- Continue valsartan  40mg  daily.   HLD: -- Continue Crestor  20mg  daily.   CKD stage IIIa:  -- Creat stable 1.09.    Hypoxia:  -- Noted s/p TAVR. -- Discharge home with home 02. -- Now resolved and pt asks for order for the tank to be removed.    Hiatal  hernia:  -- Large hiatal hernia containing the entirety of the stomach within the chest. -- Dr .Shyrl reviewed and felt this was amenable to repair but pt prefers conservative management for now.  Medication Adjustments/Labs and Tests Ordered: Current medicines are reviewed at length with the patient today.  Concerns regarding medicines are outlined above.  Orders Placed This Encounter  Procedures   EKG 12-Lead   Meds ordered this encounter  Medications   azithromycin  (ZITHROMAX ) 500 MG tablet    Sig: Take 1 tablet (500 mg total) by mouth as directed. Take one tablet 1 hour before any dental work including cleanings.    Dispense:  6 tablet    Refill:  12    Supervising Provider:   WONDA, MICHAEL [[id number]]    There are no Patient Instructions on file for this visit.   Signed, Lamarr Hummer, PA-C  12/13/2023 2:10 PM    [facility]

## 2023-12-20 ENCOUNTER — Ambulatory Visit (INDEPENDENT_AMBULATORY_CARE_PROVIDER_SITE_OTHER): Admitting: Family Medicine

## 2023-12-20 ENCOUNTER — Encounter: Payer: Self-pay | Admitting: Family Medicine

## 2023-12-20 VITALS — BP 144/78 | HR 78 | Ht 64.0 in | Wt 186.1 lb

## 2023-12-20 DIAGNOSIS — N3281 Overactive bladder: Secondary | ICD-10-CM | POA: Insufficient documentation

## 2023-12-20 DIAGNOSIS — Z952 Presence of prosthetic heart valve: Secondary | ICD-10-CM | POA: Diagnosis not present

## 2023-12-20 MED ORDER — SOLIFENACIN SUCCINATE 5 MG PO TABS: 5.0000 mg | ORAL_TABLET | Freq: Every day | ORAL | 1 refills | Status: DC

## 2023-12-20 NOTE — Progress Notes (Unsigned)
 Established Patient Office Visit  Subjective   Patient ID: Tracey Walton, female    DOB: April 09, 1940  Age: 83 y.o. MRN: [id number]  Chief Complaint  Patient presents with   Hospitalization Follow-up    HPI  Subjective - Follow-up post-aortic valve replacement (AVR) on 12/05/2023. Reports feeling better and is no longer requiring supplemental oxygen. Reports an ED visit on approximately 12/07/2023 for unresponsiveness; daughter was unable to arouse them. Suspects it was related to medication timing. - New complaint of insomnia. Reports sleeping only 1-1.5 hours per night. Wakes frequently (every 1-1.5 hours) with the urge to urinate. Wonders if large hernia is pressing on the bladder. Reports feeling nerves are getting better. - Reports continued presence of a large hernia. - No longer wearing the Zio patch, monitoring period completed yesterday. Reports feeling fluttering or palpitations. - Denies any current pain. Reports oxycodone  causes adverse effects and requests it be added to the allergy/contraindication list.  Medications: Carvedilol , amlodipine , valsartan  (blood pressure), low-dose aspirin  81 mg (post-AVR), fluoxetine  (mood). Reports prior prescription for overactive bladder was discontinued due to cost and side effect of high blood pressure.  PMH, PSH, FH, Social Hx: PMHx: Hypertension, anxiety, large hernia, overactive bladder. History of knee replacements. Penicillin allergy. No history of major bleeding episodes or stomach bleeds. PSHx: Aortic valve replacement (12/05/2023), hysterectomy, bladder tack.  ROS: Constitutional: Denies fatigue with activity. Cardiovascular: Reports palpitations/fluttering. No chest pain. GU: Reports urinary frequency and nocturia, waking every 1-1.5 hours to void. Psychiatric: Reports improving anxiety but significant insomnia.    The ASCVD Risk score (Arnett DK, et al., 2019) failed to calculate for the following reasons:   The 2019  ASCVD risk score is only valid for ages 8 to 12  Health Maintenance Due  Topic Date Due   DTaP/Tdap/Td (1 - Tdap) Never done   DEXA SCAN  Never done   Influenza Vaccine  11/02/2023   COVID-19 Vaccine (5 - Moderna risk 2024-25 season) 12/03/2023      Objective:     BP (!) 169/76   Pulse 78   Ht 5' 4 (1.626 m)   Wt 186 lb 1.9 oz (84.4 kg)   SpO2 94%   BMI 31.95 kg/m  {Vitals History (Optional):23777}  Physical Exam Objective General: No acute distress. CV: Regular rate and rhythm, no murmurs, rubs, or gallops. PULM: Lungs clear to auscultation bilaterally.    No results found for any visits on 12/20/23.      Assessment & Plan:   S/P TAVR (transcatheter aortic valve replacement) Assessment & Plan: Stable post-operatively. No longer requiring oxygen. ED visit for unresponsiveness post-op was self-limited. Continues on low-dose aspirin . The rationale for aspirin  over clopidogrel is unclear post-AVR. Zio patch monitoring is complete. Reports palpitations. - Continue current medications including aspirin . Will investigate standard of care anticoagulation/antiplatelet therapy post-AVR. - Follow up on Zio patch results when available.   OAB (overactive bladder) Assessment & Plan: Reports significant sleep disturbance secondary to nocturia. Previous medication for OAB was stopped due to side effects (hypertension) and cost. - Prescribed Vesicare  (solifenacin ) 30-day supply to [facility]. - Counseled to notify office if medication is too expensive. - Discussed that medication is specific to bladder receptors and should not interact with other medications or increase blood pressure.   Other orders -     Solifenacin  Succinate; Take 1 tablet (5 mg total) by mouth daily.  Dispense: 30 tablet; Refill: 1     No follow-ups on file.    Toribio MARLA Slain,  MD

## 2023-12-20 NOTE — Assessment & Plan Note (Signed)
 Reports significant sleep disturbance secondary to nocturia. Previous medication for OAB was stopped due to side effects (hypertension) and cost. - Prescribed Vesicare  (solifenacin ) 30-day supply to [facility]. - Counseled to notify office if medication is too expensive. - Discussed that medication is specific to bladder receptors and should not interact with other medications or increase blood pressure.

## 2023-12-20 NOTE — Patient Instructions (Addendum)
 It was nice to see you today,  We addressed the following topics today: -I have prescribed a medication called Vesicare  to help with your urinary frequency at night - Before your next appointment in December we will get some labs and discussed them at your visit. - Continue to take your duloxetine.  Have a great day,  Rolan Slain, MD

## 2023-12-20 NOTE — Assessment & Plan Note (Signed)
 Stable post-operatively. No longer requiring oxygen. ED visit for unresponsiveness post-op was self-limited. Continues on low-dose aspirin . The rationale for aspirin  over clopidogrel is unclear post-AVR. Zio patch monitoring is complete. Reports palpitations. - Continue current medications including aspirin . Will investigate standard of care anticoagulation/antiplatelet therapy post-AVR. - Follow up on Zio patch results when available.

## 2023-12-27 DIAGNOSIS — M5413 Radiculopathy, cervicothoracic region: Secondary | ICD-10-CM | POA: Diagnosis not present

## 2023-12-31 ENCOUNTER — Ambulatory Visit: Payer: Self-pay | Admitting: Internal Medicine

## 2023-12-31 DIAGNOSIS — I447 Left bundle-branch block, unspecified: Secondary | ICD-10-CM

## 2023-12-31 NOTE — Addendum Note (Signed)
 Encounter addended by: Demaya Hardge N on: 12/31/2023 11:09 AM  Actions taken: Imaging Exam ended

## 2023-12-31 NOTE — Addendum Note (Signed)
 Encounter addended by: Veeda Virgo N on: 12/31/2023 11:08 AM  Actions taken: Imaging Exam ended

## 2024-01-03 ENCOUNTER — Other Ambulatory Visit: Payer: Self-pay | Admitting: Family Medicine

## 2024-01-03 MED ORDER — SOLIFENACIN SUCCINATE 5 MG PO TABS: 5.0000 mg | ORAL_TABLET | Freq: Every day | ORAL | 1 refills | Status: DC

## 2024-01-03 NOTE — Telephone Encounter (Signed)
 Copied from CRM [id number]. Topic: Clinical - Medication Refill >> Jan 03, 2024  2:21 PM Carlatta H wrote: Medication: solifenacin  (VESICARE ) 5 MG tablet  Has the patient contacted their pharmacy? No (Agent: If no, request that the patient contact the pharmacy for the refill. If patient does not wish to contact the pharmacy document the reason why and proceed with request.) (Agent: If yes, when and what did the pharmacy advise?)  This is the patient's preferred pharmacy:  [facility] [place] [place] - [address] [place] [address] Phone: [phone] Fax: [phone]    Is this the correct pharmacy for this prescription? Yes If no, delete pharmacy and type the correct one.   Has the prescription been filled recently? No  Is the patient out of the medication? No  Has the patient been seen for an appointment in the last year OR does the patient have an upcoming appointment? Yes  Can we respond through MyChart? No  Agent: Please be advised that Rx refills may take up to 3 business days. We ask that you follow-up with your pharmacy.

## 2024-01-14 ENCOUNTER — Ambulatory Visit: Payer: Self-pay | Admitting: Physician Assistant

## 2024-01-14 ENCOUNTER — Ambulatory Visit (HOSPITAL_COMMUNITY)
Admission: RE | Admit: 2024-01-14 | Discharge: 2024-01-14 | Disposition: A | Discharge status: Home / Self Care | Source: Ambulatory Visit | Attending: Cardiovascular Disease | Admitting: Cardiovascular Disease

## 2024-01-14 ENCOUNTER — Ambulatory Visit: Admitting: Physician Assistant

## 2024-01-14 VITALS — BP 138/60 | HR 66 | Ht 64.0 in | Wt 189.0 lb

## 2024-01-14 DIAGNOSIS — I10 Essential (primary) hypertension: Secondary | ICD-10-CM | POA: Insufficient documentation

## 2024-01-14 DIAGNOSIS — Z952 Presence of prosthetic heart valve: Secondary | ICD-10-CM | POA: Diagnosis present

## 2024-01-14 DIAGNOSIS — N1831 Chronic kidney disease, stage 3a: Secondary | ICD-10-CM | POA: Diagnosis not present

## 2024-01-14 DIAGNOSIS — I447 Left bundle-branch block, unspecified: Secondary | ICD-10-CM

## 2024-01-14 DIAGNOSIS — R0902 Hypoxemia: Secondary | ICD-10-CM | POA: Diagnosis present

## 2024-01-14 DIAGNOSIS — R911 Solitary pulmonary nodule: Secondary | ICD-10-CM

## 2024-01-14 DIAGNOSIS — E782 Mixed hyperlipidemia: Secondary | ICD-10-CM | POA: Insufficient documentation

## 2024-01-14 DIAGNOSIS — E041 Nontoxic single thyroid nodule: Secondary | ICD-10-CM | POA: Insufficient documentation

## 2024-01-14 DIAGNOSIS — K449 Diaphragmatic hernia without obstruction or gangrene: Secondary | ICD-10-CM | POA: Insufficient documentation

## 2024-01-14 LAB — ECHOCARDIOGRAM COMPLETE
AR max vel: 0.97 cm2
AV Area VTI: 1.09 cm2
AV Area mean vel: 1.08 cm2
AV Mean grad: 13 mmHg
AV Peak grad: 25.2 mmHg
Ao pk vel: 2.51 m/s
Area-P 1/2: 3.45 cm2
S' Lateral: 3.1 cm

## 2024-01-14 NOTE — Progress Notes (Addendum)
 HEART AND VASCULAR CENTER   MULTIDISCIPLINARY HEART VALVE CLINIC                                     Cardiology Office Note:    Date:  12/13/2023   ID:  Tracey Walton, DOB 30-Jun-1940, MRN [id number]  PCP:  Chandra Toribio POUR, MD  [facility] Cardiologist:  Lurena POUR Red, MD  [facility] Structural heart: Lurena POUR Red, MD [facility] Electrophysiologist:  None   Referring MD: Chandra Toribio POUR, MD   1 month s/p TAVR  History of Present Illness:    Tracey Walton is a 83 y.o. female with a hx of HTN, HLD, CKD stage IIIa, and severe AS s/p TAVR 12/04/23 who presents to clinic for follow up.    She developed chest pressure and exertional shortness of breath. Echo 09/06/23 showed EF 60% and severe AS with a mean grad 46 mmHg, AVA 1.44 cm2 (not felt to be accurate). [facility] 10/19/23 showed single-vessel ostial diagonal disease which will be treated medically. S/p successful TAVR with a 26 mm Edwards Sapien 3 Ultra Resilia THV via the TF approach on 12/04/23. Post operative echo showed EF 65%, mild RVE, normally functioning TAVR with a mean gradient of 13 mmHg and no PVL. Noted to have some hypoxia and discharged with home 02. Discharged with Zio AT given new LBBB which did not show any HAVB.  Seen 12/07/23 in ER for nausea and vomitting felt to be related to taking too many pain pills. She has done great subsequently and asked for oxygent to be removed from their home.  Today the patient presents to clinic for follow up. Here with her daughter. She is doing great with an improvement in her breathing but still have SOB that she thinks might be related to her hiatal hernia. Notices it mostly with exertion. But no more wheezing since TAVR. No CP. Occasional ankle edema, but no orthopnea or PND. No dizziness or syncope. No blood in stool or urine. No palpitations.      Past Medical History:  Diagnosis Date   Anxiety    Arthritis    qwhere (08/09/2017)   Chest pain 2011   negative for MI    Chronic kidney disease    renal insufficiency. 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 down after her knee surgery back in 2008   Chronic lower back pain    Depression    GERD (gastroesophageal reflux disease)    History of hiatal hernia    Hyperlipidemia    Hypertension    Osteoporosis    Polymyalgia (HCC)    S/P TAVR (transcatheter aortic valve replacement) 12/04/2023   s/p TAVR with a 26 mm Edwards Sapien 3 Ultra Resilia THV via the TF approach by Dr. Red and Dr. Shyrl   Severe aortic stenosis    Vertigo      Current Medications: Current Meds  Medication Sig   amLODipine  (NORVASC ) 5 MG tablet TAKE 1 TABLET BY MOUTH DAILY   aspirin  EC 81 MG tablet Take 1 tablet (81 mg total) by mouth daily. Swallow whole.   azithromycin  (ZITHROMAX ) 500 MG tablet Take 1 tablet (500 mg total) by mouth as directed. Take one tablet 1 hour before any dental work including cleanings.   baclofen  (LIORESAL ) 10 MG tablet Take 10 mg by mouth daily as needed for muscle spasms.   carvedilol  (COREG ) 6.25 MG tablet TAKE  1 TABLET BY MOUTH TWICE  DAILY   cetirizine (ZYRTEC) 10 MG tablet Take 10 mg by mouth daily.   FLUoxetine  (PROZAC ) 10 MG capsule Take 1 capsule (10 mg total) by mouth daily.   FLUoxetine  (PROZAC ) 20 MG capsule Take 1 capsule (20 mg total) by mouth daily.   fluticasone  (FLONASE ) 50 MCG/ACT nasal spray Place 2 sprays into both nostrils daily.   HYDROcodone -acetaminophen  (NORCO/VICODIN) 5-325 MG tablet Take 2 tablets by mouth every 4 (four) hours as needed for moderate pain (pain score 4-6).   omeprazole  (PRILOSEC) 20 MG capsule TAKE 1 CAPSULE BY MOUTH DAILY   Polyethyl Glycol-Propyl Glycol 0.4-0.3 % SOLN Place 1 drop into both eyes 3 (three) times daily as needed (for dry/irritated eyes.).   rosuvastatin  (CRESTOR ) 20 MG tablet TAKE 1 TABLET BY MOUTH DAILY   trimethoprim  (TRIMPEX ) 100 MG tablet Take 100 mg by mouth at bedtime.   valsartan  (DIOVAN ) 40 MG tablet TAKE ONE-HALF TABLET BY MOUTH  DAILY       ROS:   Please see the history of present illness.    All other systems reviewed and are negative.  EKGs   EKG Interpretation Date/Time:  Thursday December 13 2023 13:42:56 EDT Ventricular Rate:  77 PR Interval:  194 QRS Duration:  108 QT Interval:  382 QTC Calculation: 432 R Axis:   -15  Text Interpretation: Normal sinus rhythm Moderate voltage criteria for LVH, may be normal variant Cannot rule out Anterior infarct , age undetermined Confirmed by Sebastian Collar [phone]) on 12/13/2023 1:55:55 PM   Risk Assessment/Calculations:          Physical Exam:    VS:  BP (!) 136/60   Pulse 77   Ht 5' 4 (1.626 m)   Wt 194 lb 3.2 oz (88.1 kg)   SpO2 96%   BMI 33.33 kg/m     Wt Readings from Last 3 Encounters:  12/13/23 194 lb 3.2 oz (88.1 kg)  12/07/23 200 lb (90.7 kg)  12/05/23 201 lb 11.2 oz (91.5 kg)     GEN: Well nourished, well developed in no acute distress NECK: No JVD CARDIAC: RRR, 2/6 flow murmur @ R/LUSB. no rubs, gallops RESPIRATORY:  Clear to auscultation without rales, wheezing or rhonchi  ABDOMEN: Soft, non-tender, non-distended EXTREMITIES:  No edema; No deformity.    ASSESSMENT:    1. S/P TAVR (transcatheter aortic valve replacement)   2. LBBB (left bundle branch block)   3. Essential hypertension   4. Mixed hyperlipidemia   5. Stage 3a chronic kidney disease (HCC)   6. Hypoxia   7. Hiatal hernia   8. Pulmonary nodule  9. Thyroid  noudle    PLAN:    In order of problems listed above:  Severe AS s/p TAVR:  -- Echo today shows EF 65%, mild LVH, normally functioning TAVR with a mean gradient of 13 mm hg and no PVL.  -- NYHA class II but has had a significant improvement since TAVR.  -- SBE prophylaxis discussed; she has azithromycin  due to a PCN allergy.   -- Continue Aspirin  81mg  daily. -- I will see back for 1 year echo and OV.  New LBBB: -- Developed a new LBBB after TAVR.  -- Zio AT with no HAVB.    HTN: -- BP well controlled.   -- Continue amlodipine  5mg  daily. -- Continue carvedolol 6.25mg  BID. -- Continue valsartan  40mg  daily.   HLD: -- Continue Crestor  20mg  daily.   CKD stage IIIa:  -- Creat stable 1.09.  Hypoxia:  -- Noted s/p TAVR. -- Discharge home with home 02. -- Now resolved and 02 tank removed.    Hiatal hernia:  -- Large hiatal hernia containing the entirety of the stomach within the chest. -- Dr .Shyrl reviewed and felt this was amenable to repair and pt would like to discuss this. -- Apt made with Dr. Shyrl for consultation on 01/18/24.  Pulmonary nodule: -- Pre TAVR CT noted a left upper lobe lung nodule, measuring 4 mm (axial 16).  -- Pt is low risk with no history of previous malignancy or smoking history so no follow up is necessary.   Thyroid  nodule: -- Pre TAVR CTs noted a multinodular thyroid  gland and recommend thyroid  US .  -- Will send to Dr. Flynn her PCP to follow up on ( faxed to his office and also printed for pt to bring into her apt in December).    Medication Adjustments/Labs and Tests Ordered: Current medicines are reviewed at length with the patient today.  Concerns regarding medicines are outlined above.  Orders Placed This Encounter  Procedures   EKG 12-Lead   Meds ordered this encounter  Medications   azithromycin  (ZITHROMAX ) 500 MG tablet    Sig: Take 1 tablet (500 mg total) by mouth as directed. Take one tablet 1 hour before any dental work including cleanings.    Dispense:  6 tablet    Refill:  12    Supervising Provider:   WONDA, MICHAEL [[id number]]    There are no Patient Instructions on file for this visit.   Signed, Lamarr Hummer, PA-C  12/13/2023 2:10 PM    [facility]

## 2024-01-14 NOTE — Patient Instructions (Signed)
 Medication Instructions:  Your physician recommends that you continue on your current medications as directed. Please refer to the Current Medication list given to you today.  *If you need a refill on your cardiac medications before your next appointment, please call your pharmacy*  Lab Work: None needed If you have labs (blood work) drawn today and your tests are completely normal, you will receive your results only by: MyChart Message (if you have MyChart) OR A paper copy in the mail If you have any lab test that is abnormal or we need to change your treatment, we will call you to review the results.  Testing/Procedures: 12/04/2024 Your physician has requested that you have an echocardiogram. Echocardiography is a painless test that uses sound waves to create images of your heart. It provides your doctor with information about the size and shape of your heart and how well your heart's chambers and valves are working. This procedure takes approximately one hour. There are no restrictions for this procedure. Please do NOT wear cologne, perfume, aftershave, or lotions (deodorant is allowed). Please arrive 15 minutes prior to your appointment time.  Please note: We ask at that you not bring children with you during ultrasound (echo/ vascular) testing. Due to room size and safety concerns, children are not allowed in the ultrasound rooms during exams. Our front office staff cannot provide observation of children in our lobby area while testing is being conducted. An adult accompanying a patient to their appointment will only be allowed in the ultrasound room at the discretion of the ultrasound technician under special circumstances. We apologize for any inconvenience.   Follow-Up: At [facility], you and your health needs are our priority.  As part of our continuing mission to provide you with exceptional heart care, our providers are all part of one team.  This team includes your primary  Cardiologist (physician) and Advanced Practice Providers or APPs (Physician Assistants and Nurse Practitioners) who all work together to provide you with the care you need, when you need it.  Your next appointment:   6 month(s)  Provider:   Lurena Red, MD    We recommend signing up for the patient portal called MyChart.  Sign up information is provided on this After Visit Summary.  MyChart is used to connect with patients for Virtual Visits (Telemedicine).  Patients are able to view lab/test results, encounter notes, upcoming appointments, etc.  Non-urgent messages can be sent to your provider as well.   To learn more about what you can do with MyChart, go to [email or web address].   Other Instructions You have been referred to see Dr. Shyrl, a cardiothoracic surgeon. Your appointment has been scheduled for this Friday, 01/18/24 at 11:30AM.

## 2024-01-16 ENCOUNTER — Other Ambulatory Visit: Payer: Self-pay | Admitting: Family Medicine

## 2024-01-16 ENCOUNTER — Telehealth: Payer: Self-pay

## 2024-01-16 DIAGNOSIS — F32A Depression, unspecified: Secondary | ICD-10-CM

## 2024-01-16 DIAGNOSIS — E042 Nontoxic multinodular goiter: Secondary | ICD-10-CM

## 2024-01-16 DIAGNOSIS — I10 Essential (primary) hypertension: Secondary | ICD-10-CM

## 2024-01-16 DIAGNOSIS — E785 Hyperlipidemia, unspecified: Secondary | ICD-10-CM

## 2024-01-16 NOTE — Progress Notes (Signed)
 Called patient she is advised of the referral and recommendation

## 2024-01-16 NOTE — Telephone Encounter (Signed)
 Called pt she is advised about the referral and recommendation

## 2024-01-16 NOTE — Progress Notes (Signed)
 Please let the pt know I ordered a ultrasound of her thyroid  to further evaluate the findings on her recent CT scan.  I ordered it now so that we can discuss the results when I see her again at her next visit.  Someone will call her to schedule it.

## 2024-01-18 ENCOUNTER — Other Ambulatory Visit: Payer: Self-pay

## 2024-01-18 ENCOUNTER — Ambulatory Visit
Attending: Thoracic Surgery (Cardiothoracic Vascular Surgery) | Admitting: Thoracic Surgery (Cardiothoracic Vascular Surgery)

## 2024-01-18 VITALS — BP 149/70 | HR 60 | Resp 20 | Ht 64.0 in | Wt 189.0 lb

## 2024-01-18 DIAGNOSIS — K449 Diaphragmatic hernia without obstruction or gangrene: Secondary | ICD-10-CM

## 2024-01-18 NOTE — Progress Notes (Signed)
 [address]       [place] [address]             [phone]                    Tracey Walton [facility] Medical Record #[id number] Date of Birth: 08/04/1940  Referring: Wendel Lurena POUR, MD Primary Care: Chandra Toribio POUR, MD Primary Cardiologist: Arun K Thukkani, MD  Chief Complaint:    Chief Complaint  Patient presents with   Hiatal Hernia    CTA Chest 10/25/23    History of Present Illness:    Tracey Walton 83 y.o. female presents for surgical evaluation of a paraesophageal hernia.  She previously underwent a TAVR on September 2 of this year, but continues to have shortness of breath.  She also complains of significant reflux, weekly dysphagia, and worsening shortness of breath with meals and liquids.  She denies any odynophagia.  She is unable to lay flat, and has been sleeping in a recliner for several years.       Past Medical History:  Diagnosis Date   Anxiety    Arthritis    qwhere (08/09/2017)   Chest pain 2011   negative for MI   Chronic kidney disease    renal insufficiency. 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 down after her knee surgery back in 2008   Chronic lower back pain    Depression    GERD (gastroesophageal reflux disease)    History of hiatal hernia    Hyperlipidemia    Hypertension    Osteoporosis    Polymyalgia    S/P TAVR (transcatheter aortic valve replacement) 12/04/2023   s/p TAVR with a 26 mm Edwards Sapien 3 Ultra Resilia THV via the TF approach by Dr. Wendel and Dr. Shyrl   Severe aortic stenosis    Vertigo     Past Surgical History:  Procedure Laterality Date   ABDOMINAL HYSTERECTOMY     partial   BILATERAL CARPAL TUNNEL RELEASE  2003-2005   right-left   CATARACT EXTRACTION W/ INTRAOCULAR LENS  IMPLANT, BILATERAL Bilateral    INTRAOPERATIVE TRANSTHORACIC ECHOCARDIOGRAM N/A 12/04/2023   Procedure: ECHOCARDIOGRAM, TRANSTHORACIC;  Surgeon: Thukkani, Arun K, MD;  Location: [facility];  Service:  Cardiovascular;  Laterality: N/A;   JOINT REPLACEMENT     REVERSE SHOULDER ARTHROPLASTY Left 10/12/2016   Procedure: REVERSE LEFT SHOULDER ARTHROPLASTY;  Surgeon: Melita Drivers, MD;  Location: [facility] OR;  Service: Orthopedics;  Laterality: Left;   REVERSE SHOULDER ARTHROPLASTY Right 08/09/2017   Procedure: RIGHT REVERSE SHOULDER ARTHROPLASTY;  Surgeon: Melita Drivers, MD;  Location: [facility] OR;  Service: Orthopedics;  Laterality: Right;   RIGHT HEART CATH AND CORONARY ANGIOGRAPHY N/A 10/19/2023   Procedure: RIGHT HEART CATH AND CORONARY ANGIOGRAPHY;  Surgeon: Wendel Lurena POUR, MD;  Location: [facility];  Service: Cardiovascular;  Laterality: N/A;   SHOULDER ARTHROSCOPY Right 2003   TOOTH EXTRACTION     1 tooth (08/09/2017)   TOTAL KNEE ARTHROPLASTY Bilateral     Family History  Problem Relation Age of Onset   Heart attack Mother    Heart disease Mother    Hypertension Father    Heart failure Father    Kidney failure Father    Hyperlipidemia Father    Stroke Father    Hypertension Sister    Hyperlipidemia Sister      Social History   Tobacco Use  Smoking Status Never   Passive  exposure: Never  Smokeless Tobacco Never    Social History   Substance and Sexual Activity  Alcohol Use Never     Allergies  Allergen Reactions   Oxycodone  Hcl Other (See Comments)   Penicillins Rash    Pt tolerated 2g ancef  without issue 10/12/16    Current Outpatient Medications  Medication Sig Dispense Refill   amLODipine  (NORVASC ) 5 MG tablet TAKE 1 TABLET BY MOUTH DAILY 90 tablet 3   aspirin  EC 81 MG tablet Take 1 tablet (81 mg total) by mouth daily. Swallow whole.     azithromycin  (ZITHROMAX ) 500 MG tablet Take 1 tablet (500 mg total) by mouth as directed. Take one tablet 1 hour before any dental work including cleanings. 6 tablet 12   baclofen  (LIORESAL ) 10 MG tablet Take 10 mg by mouth daily as needed for muscle spasms.     carvedilol  (COREG ) 6.25 MG tablet TAKE 1 TABLET BY MOUTH TWICE   DAILY 180 tablet 3   cetirizine (ZYRTEC) 10 MG tablet Take 10 mg by mouth daily.     FLUoxetine  (PROZAC ) 10 MG capsule Take 1 capsule (10 mg total) by mouth daily. 30 capsule 1   FLUoxetine  (PROZAC ) 20 MG capsule Take 1 capsule (20 mg total) by mouth daily. 90 capsule 3   fluticasone  (FLONASE ) 50 MCG/ACT nasal spray Place 2 sprays into both nostrils daily. 48 g 2   HYDROcodone -acetaminophen  (NORCO/VICODIN) 5-325 MG tablet Take 2 tablets by mouth every 4 (four) hours as needed for moderate pain (pain score 4-6).     omeprazole  (PRILOSEC) 20 MG capsule TAKE 1 CAPSULE BY MOUTH DAILY 100 capsule 2   Polyethyl Glycol-Propyl Glycol 0.4-0.3 % SOLN Place 1 drop into both eyes 3 (three) times daily as needed (for dry/irritated eyes.).     rosuvastatin  (CRESTOR ) 20 MG tablet TAKE 1 TABLET BY MOUTH DAILY 90 tablet 3   solifenacin  (VESICARE ) 5 MG tablet Take 1 tablet (5 mg total) by mouth daily. 90 tablet 1   trimethoprim  (TRIMPEX ) 100 MG tablet Take 100 mg by mouth at bedtime.     valsartan  (DIOVAN ) 40 MG tablet TAKE ONE-HALF TABLET BY MOUTH  DAILY 50 tablet 2   No current facility-administered medications for this visit.    Review of Systems  Respiratory:  Positive for shortness of breath.   Cardiovascular:  Negative for chest pain.  Gastrointestinal:  Positive for abdominal pain and heartburn. Negative for nausea.     PHYSICAL EXAMINATION: BP (!) 149/70   Pulse 60   Resp 20   Ht 5' 4 (1.626 m)   Wt 189 lb (85.7 kg)   SpO2 93% Comment: RA  BMI 32.44 kg/m  Physical Exam Constitutional:      General: She is not in acute distress.    Appearance: She is not ill-appearing.  Eyes:     Extraocular Movements: Extraocular movements intact.  Cardiovascular:     Rate and Rhythm: Normal rate.  Musculoskeletal:        General: Normal range of motion.     Cervical back: Normal range of motion.  Skin:    General: Skin is warm and dry.  Neurological:     General: No focal deficit present.      Mental Status: She is alert and oriented to person, place, and time.     Diagnostic Studies & Laboratory data:          I have independently reviewed the above radiology studies  and reviewed the findings with the patient.  Recent Lab Findings: Lab Results  Component Value Date   WBC 9.3 12/07/2023   HGB 11.6 (L) 12/07/2023   HCT 36.2 12/07/2023   PLT 146 (L) 12/07/2023   GLUCOSE 118 (H) 12/07/2023   CHOL 162 07/12/2023   TRIG 85 07/12/2023   HDL 44 07/12/2023   LDLCALC 102 (H) 07/12/2023   ALT 8 12/07/2023   AST 19 12/07/2023   NA 137 12/07/2023   K 3.7 12/07/2023   CL 101 12/07/2023   CREATININE 1.24 (H) 12/07/2023   BUN 34 (H) 12/07/2023   CO2 24 12/07/2023   TSH 0.991 02/01/2023   INR 1.0 11/30/2023   HGBA1C 5.4 07/12/2023      Assessment / Plan:   83 year old female with a large paraesophageal hernia.  She also has an elevated right hemidiaphragm which could be contributing to some of her shortness of breath.  After undergoing a TAVR she continues to be dyspneic, thus I think that the bulk of her symptoms is due to to her hernia.  We discussed the risks and benefits of an EGD followed by a robotic assisted paraesophageal hernia repair with toupee fundoplication.  She is agreeable to proceed.     I  spent 40 minutes with  the patient face to face and greater then 50% of the time was spent in counseling and coordination of care.    Linnie MALVA Rayas 01/18/2024 2:55 PM

## 2024-01-22 ENCOUNTER — Other Ambulatory Visit: Payer: Self-pay | Admitting: *Deleted

## 2024-02-04 ENCOUNTER — Ambulatory Visit
Admission: RE | Admit: 2024-02-04 | Discharge: 2024-02-04 | Disposition: A | Source: Ambulatory Visit | Attending: Family Medicine

## 2024-02-04 DIAGNOSIS — E042 Nontoxic multinodular goiter: Secondary | ICD-10-CM

## 2024-02-11 ENCOUNTER — Ambulatory Visit: Payer: Self-pay | Admitting: Family Medicine

## 2024-02-11 ENCOUNTER — Other Ambulatory Visit: Payer: Self-pay | Admitting: Family Medicine

## 2024-02-11 DIAGNOSIS — E041 Nontoxic single thyroid nodule: Secondary | ICD-10-CM

## 2024-02-12 NOTE — Progress Notes (Signed)
Called pt she is advised

## 2024-02-15 NOTE — Pre-Procedure Instructions (Signed)
 Surgical Instructions   Your procedure is scheduled on February 20, 2024. Report to [facility] Main Entrance A at 6:30 A.M., then check in with the Admitting office. Any questions or running late day of surgery: call [phone]  Questions prior to your surgery date: call [phone], Monday-Friday, 8am-4pm. If you experience any cold or flu symptoms such as cough, fever, chills, shortness of breath, etc. between now and your scheduled surgery, please notify us  at the above number.     Remember:  Do not eat or drink after midnight the night before your surgery    Take these medicines the morning of surgery with A SIP OF WATER: amLODipine  (NORVASC )  carvedilol  (COREG )  cetirizine (ZYRTEC)  FLUoxetine  (PROZAC )  fluticasone  (FLONASE ) nasal spray  rosuvastatin  (CRESTOR )  solifenacin  (VESICARE )    May take these medicines IF NEEDED: baclofen  (LIORESAL )  HYDROcodone -acetaminophen  (NORCO/VICODIN)  omeprazole  (PRILOSEC)  Polyethyl Glycol-Propyl Glycol eye drops   Continue taking your Aspirin  through the day before surgery. DO NOT take any the morning of surgery.   One week prior to surgery, STOP taking any Aleve , Naproxen , Ibuprofen, Motrin, Advil, Goody's, BC's, all herbal medications, fish oil, and non-prescription vitamins.                     Do NOT Smoke (Tobacco/Vaping) for 24 hours prior to your procedure.  If you use a CPAP at night, you may bring your mask/headgear for your overnight stay.   You will be asked to remove any contacts, glasses, piercing's, hearing aid's, dentures/partials prior to surgery. Please bring cases for these items if needed.    Patients discharged the day of surgery will not be allowed to drive home, and someone needs to stay with them for 24 hours.  SURGICAL WAITING ROOM VISITATION Patients may have no more than 2 support people in the waiting area - these visitors may rotate.   Pre-op nurse will coordinate an appropriate time for 1 ADULT  support person, who may not rotate, to accompany patient in pre-op.  Children under the age of 57 must have an adult with them who is not the patient and must remain in the main waiting area with an adult.  If the patient needs to stay at the hospital during part of their recovery, the visitor guidelines for inpatient rooms apply.  Please refer to the [facility] website for the visitor guidelines for any additional information.   If you received a COVID test during your pre-op visit  it is requested that you wear a mask when out in public, stay away from anyone that may not be feeling well and notify your surgeon if you develop symptoms. If you have been in contact with anyone that has tested positive in the last 10 days please notify you surgeon.      Pre-operative CHG Bathing Instructions   You can play a key role in reducing the risk of infection after surgery. Your skin needs to be as free of germs as possible. You can reduce the number of germs on your skin by washing with CHG (chlorhexidine  gluconate) soap before surgery. CHG is an antiseptic soap that kills germs and continues to kill germs even after washing.   DO NOT use if you have an allergy to chlorhexidine /CHG or antibacterial soaps. If your skin becomes reddened or irritated, stop using the CHG and notify one of our RNs at [phone].              TAKE A  SHOWER THE NIGHT BEFORE SURGERY   Please keep in mind the following:  DO NOT shave, including legs and underarms, 48 hours prior to surgery.   You may shave your face before/day of surgery.  Place clean sheets on your bed the night before surgery Use a clean washcloth (not used since being washed) for shower. DO NOT sleep with pet's night before surgery.  CHG Shower Instructions:  Wash your face and private area with normal soap. If you choose to wash your hair, wash first with your normal shampoo.  After you use shampoo/soap, rinse your hair and body thoroughly to  remove shampoo/soap residue.  Turn the water OFF and apply half the bottle of CHG soap to a CLEAN washcloth.  Apply CHG soap ONLY FROM YOUR NECK DOWN TO YOUR TOES (washing for 3-5 minutes)  DO NOT use CHG soap on face, private areas, open wounds, or sores.  Pay special attention to the area where your surgery is being performed.  If you are having back surgery, having someone wash your back for you may be helpful. Wait 2 minutes after CHG soap is applied, then you may rinse off the CHG soap.  Pat dry with a clean towel  Put on clean pajamas    Additional instructions for the day of surgery: If you choose, you may shower the morning of surgery with an antibacterial soap.  DO NOT APPLY any lotions, deodorants, cologne, or perfumes.   Do not wear jewelry or makeup Do not wear nail polish, gel polish, artificial nails, or any other type of covering on natural nails (fingers and toes) Do not bring valuables to the hospital. [facility] is not responsible for valuables/personal belongings. Put on clean/comfortable clothes.  Please brush your teeth.  Ask your nurse before applying any prescription medications to the skin.

## 2024-02-18 ENCOUNTER — Other Ambulatory Visit: Payer: Self-pay

## 2024-02-18 ENCOUNTER — Encounter (HOSPITAL_COMMUNITY): Payer: Self-pay

## 2024-02-18 ENCOUNTER — Ambulatory Visit (HOSPITAL_COMMUNITY)
Admission: RE | Admit: 2024-02-18 | Discharge: 2024-02-18 | Disposition: A | Discharge status: Home / Self Care | Source: Ambulatory Visit | Attending: Thoracic Surgery (Cardiothoracic Vascular Surgery) | Admitting: Thoracic Surgery (Cardiothoracic Vascular Surgery)

## 2024-02-18 ENCOUNTER — Encounter (HOSPITAL_COMMUNITY)
Admission: RE | Admit: 2024-02-18 | Discharge: 2024-02-18 | Disposition: A | Discharge status: Home / Self Care | Source: Ambulatory Visit | Attending: Thoracic Surgery (Cardiothoracic Vascular Surgery) | Admitting: Thoracic Surgery (Cardiothoracic Vascular Surgery)

## 2024-02-18 VITALS — BP 138/75 | HR 70 | Temp 98.3°F | Resp 17 | Ht 60.0 in | Wt 193.7 lb

## 2024-02-18 DIAGNOSIS — N1831 Chronic kidney disease, stage 3a: Secondary | ICD-10-CM | POA: Diagnosis not present

## 2024-02-18 DIAGNOSIS — I129 Hypertensive chronic kidney disease with stage 1 through stage 4 chronic kidney disease, or unspecified chronic kidney disease: Secondary | ICD-10-CM | POA: Diagnosis not present

## 2024-02-18 DIAGNOSIS — K219 Gastro-esophageal reflux disease without esophagitis: Secondary | ICD-10-CM | POA: Diagnosis not present

## 2024-02-18 DIAGNOSIS — K449 Diaphragmatic hernia without obstruction or gangrene: Secondary | ICD-10-CM | POA: Diagnosis not present

## 2024-02-18 DIAGNOSIS — Z01818 Encounter for other preprocedural examination: Secondary | ICD-10-CM | POA: Diagnosis present

## 2024-02-18 DIAGNOSIS — E785 Hyperlipidemia, unspecified: Secondary | ICD-10-CM | POA: Diagnosis not present

## 2024-02-18 DIAGNOSIS — I447 Left bundle-branch block, unspecified: Secondary | ICD-10-CM | POA: Diagnosis not present

## 2024-02-18 DIAGNOSIS — Z79899 Other long term (current) drug therapy: Secondary | ICD-10-CM | POA: Diagnosis not present

## 2024-02-18 LAB — COMPREHENSIVE METABOLIC PANEL WITH GFR
ALT: 11 U/L (ref 0–44)
AST: 16 U/L (ref 15–41)
Albumin: 3.3 g/dL — ABNORMAL LOW (ref 3.5–5.0)
Alkaline Phosphatase: 106 U/L (ref 38–126)
Anion gap: 10 (ref 5–15)
BUN: 22 mg/dL (ref 8–23)
CO2: 25 mmol/L (ref 22–32)
Calcium: 8.6 mg/dL — ABNORMAL LOW (ref 8.9–10.3)
Chloride: 99 mmol/L (ref 98–111)
Creatinine, Ser: 1.11 mg/dL — ABNORMAL HIGH (ref 0.44–1.00)
GFR, Estimated: 50 mL/min — ABNORMAL LOW (ref >60)
Glucose, Bld: 95 mg/dL (ref 70–99)
Potassium: 4.6 mmol/L (ref 3.5–5.1)
Sodium: 134 mmol/L — ABNORMAL LOW (ref 135–145)
Total Bilirubin: 0.7 mg/dL (ref 0.0–1.2)
Total Protein: 7 g/dL (ref 6.5–8.1)

## 2024-02-18 LAB — APTT: aPTT: 28 s (ref 24–36)

## 2024-02-18 LAB — TYPE AND SCREEN
ABO/RH(D): O POS
Antibody Screen: NEGATIVE

## 2024-02-18 LAB — CBC
HCT: 37.7 % (ref 36.0–46.0)
Hemoglobin: 12.2 g/dL (ref 12.0–15.0)
MCH: 30.7 pg (ref 26.0–34.0)
MCHC: 32.4 g/dL (ref 30.0–36.0)
MCV: 95 fL (ref 80.0–100.0)
Platelets: 240 K/uL (ref 150–400)
RBC: 3.97 MIL/uL (ref 3.87–5.11)
RDW: 13.2 % (ref 11.5–15.5)
WBC: 7 K/uL (ref 4.0–10.5)
nRBC: 0 % (ref 0.0–0.2)

## 2024-02-18 LAB — URINALYSIS, ROUTINE W REFLEX MICROSCOPIC
Bilirubin Urine: NEGATIVE
Glucose, UA: NEGATIVE mg/dL
Hgb urine dipstick: NEGATIVE
Ketones, ur: NEGATIVE mg/dL
Nitrite: NEGATIVE
Protein, ur: NEGATIVE mg/dL
Specific Gravity, Urine: <1.005 — ABNORMAL LOW (ref 1.005–1.030)
pH: 6 (ref 5.0–8.0)

## 2024-02-18 LAB — PROTIME-INR
INR: 1.1 (ref 0.8–1.2)
Prothrombin Time: 14.3 s (ref 11.4–15.2)

## 2024-02-18 LAB — URINALYSIS, MICROSCOPIC (REFLEX): Bacteria, UA: NONE SEEN

## 2024-02-18 LAB — SURGICAL PCR SCREEN
MRSA, PCR: NEGATIVE
Staphylococcus aureus: NEGATIVE

## 2024-02-18 NOTE — Progress Notes (Signed)
 PCP - Chandra Toribio POUR, MD Cardiologist - Primary Cardiologist: Lurena POUR Red, MD   PPM/ICD - denies Device Orders - n/a Rep Notified - n/a  Chest x-ray - 02-18-24 EKG - 02-18-24 Stress Test - 12-28-17 ECHO - 01-14-24 Cardiac Cath - 10-19-23  Sleep Study - denies CPAP - n/a  Dm -denies  Blood Thinner Instructions:denies Aspirin  Instructions:denies  ERAS Protcol - NPO   COVID TEST- n/a   Anesthesia review: htn  Patient denies shortness of breath, fever, cough and chest pain at PAT appointment   All instructions explained to the patient, with a verbal understanding of the material. Patient agrees to go over the instructions while at home for a better understanding. Patient also instructed to self quarantine after being tested for COVID-19. The opportunity to ask questions was provided.

## 2024-02-19 NOTE — Anesthesia Preprocedure Evaluation (Addendum)
 Anesthesia Evaluation  Patient identified by MRN, date of birth, ID band Patient awake    Reviewed: Allergy & Precautions, NPO status , Patient's Chart, lab work & pertinent test results, reviewed documented beta blocker date and time   History of Anesthesia Complications Negative for: history of anesthetic complications  Airway Mallampati: III  TM Distance: >3 FB Neck ROM: Limited    Dental  (+) Dental Advisory Given   Pulmonary neg pulmonary ROS   Pulmonary exam normal        Cardiovascular hypertension, Pt. on medications and Pt. on home beta blockers Normal cardiovascular exam+ Valvular Problems/Murmurs (s/p TAVR)    '25 TTE - EF 65 to 70%. There is mild left ventricular hypertrophy. Left atrial size was mildly dilated. Trivial MR. Post TAVR with 26 mm Sapien 3 valve no significant PVL. Gradients stable since TTE done 12/05/23 AVA calculates lower due to using LVOT  diameter of 1.8 rather than 2.1 .     Neuro/Psych  PSYCHIATRIC DISORDERS Anxiety Depression     Vertigo   Neuromuscular disease    GI/Hepatic Neg liver ROS, hiatal hernia,GERD  Medicated and Controlled,,  Endo/Other   Obesity   Renal/GU CRFRenal disease     Musculoskeletal  (+) Arthritis ,   Polymyalgia    Abdominal   Peds  Hematology negative hematology ROS (+)   Anesthesia Other Findings   Reproductive/Obstetrics                              Anesthesia Physical Anesthesia Plan  ASA: 3  Anesthesia Plan: General   Post-op Pain Management: Tylenol  PO (pre-op)*   Induction: Intravenous  PONV Risk Score and Plan: 3 and Treatment may vary due to age or medical condition, Ondansetron , Dexamethasone  and Propofol  infusion  Airway Management Planned: Oral ETT and Video Laryngoscope Planned  Additional Equipment: ClearSight  Intra-op Plan:   Post-operative Plan: Extubation in OR  Informed Consent: I have  reviewed the patients History and Physical, chart, labs and discussed the procedure including the risks, benefits and alternatives for the proposed anesthesia with the patient or authorized representative who has indicated his/her understanding and acceptance.     Dental advisory given  Plan Discussed with: CRNA and Anesthesiologist  Anesthesia Plan Comments: (See PAT note 2 large bore PIV)         Anesthesia Quick Evaluation

## 2024-02-19 NOTE — Progress Notes (Signed)
 Anesthesia Chart Review:  83 year old female follows with cardiology for history of HTN, HLD, severe AS s/p TAVR 12/04/2023. [facility] Echo 09/06/23 showed EF 60% and severe AS with a mean grad 46 mmHg, AVA 1.44 cm2 (not felt to be accurate). [facility] 10/19/23 showed single-vessel ostial diagonal disease which was treated medically. S/p successful TAVR with a 26 mm Edwards Sapien 3 Ultra Resilia THV via the TF approach on 12/04/23. Post operative echo showed EF 65%, mild RVE, normally functioning TAVR with a mean gradient of 13 mmHg and no PVL. Discharged with Zio AT given new LBBB which did not show any HAVB.  Most recent echo 01/14/2024 showed LVEF 65%, mild LVH, normally functioning TAVR with mean gradient of 13 mmHg and no PVL.  Seen by Izetta Hummer, PA-C 01/14/2024 and noted to be doing well, significantly improved from pre-TAVR.  No changes to management, 1 year follow-up recommended.  Also discussed large hiatal hernia (containing the entirety of the stomach within the chest per CT 10/2023); referred to Dr. Shyrl for further evaluation.  Other pertinent history includes GERD on PPI, CKD 3 AA.  Preop labs reviewed, mild hyponatremia sodium 134, creatinine mildly elevated 1.11, otherwise unremarkable.  EKG 02/18/2024: Normal sinus rhythm.  Rate 68. Minimal voltage criteria for LVH, may be normal variant. Inferior infarct , age undetermined. Cannot rule out Anterior infarct , age undetermined  TTE 01/14/2024: 1. Left ventricular ejection fraction, by estimation, is 65 to 70%. The  left ventricle has normal function. The left ventricle has no regional  wall motion abnormalities. There is mild left ventricular hypertrophy.  Left ventricular diastolic parameters  were normal.   2. Right ventricular systolic function is normal. The right ventricular  size is normal. There is normal pulmonary artery systolic pressure.   3. Left atrial size was mildly dilated.   4. The mitral valve is abnormal. Trivial mitral  valve regurgitation. No  evidence of mitral stenosis.   5. Post TAVR with 26 mm Sapien 3 valve no significant PVL. Gradients  stable since TTE done 12/05/23 AVA calculates lower due to using LVOT  diameter of 1.8 rather than 2.1 . The aortic valve has been  repaired/replaced. Aortic valve regurgitation is not  visualized. No aortic stenosis is present.   6. The inferior vena cava is normal in size with greater than 50%  respiratory variability, suggesting right atrial pressure of 3 mmHg.      Lynwood Geofm RIGGERS [facility]/Anesthesiology Phone [phone] 02/19/2024 9:27 AM

## 2024-02-20 ENCOUNTER — Observation Stay (HOSPITAL_COMMUNITY)
Admission: RE | Admit: 2024-02-20 | Discharge: 2024-02-21 | Disposition: A | Discharge status: Home / Self Care | Attending: Thoracic Surgery (Cardiothoracic Vascular Surgery) | Admitting: Thoracic Surgery (Cardiothoracic Vascular Surgery)

## 2024-02-20 ENCOUNTER — Encounter (HOSPITAL_COMMUNITY): Payer: Self-pay | Admitting: Thoracic Surgery (Cardiothoracic Vascular Surgery)

## 2024-02-20 ENCOUNTER — Ambulatory Visit (HOSPITAL_COMMUNITY): Admitting: Physician Assistant

## 2024-02-20 ENCOUNTER — Other Ambulatory Visit: Payer: Self-pay

## 2024-02-20 ENCOUNTER — Encounter (HOSPITAL_COMMUNITY)
Admission: RE | Disposition: A | Payer: Self-pay | Source: Home / Self Care | Attending: Thoracic Surgery (Cardiothoracic Vascular Surgery)

## 2024-02-20 ENCOUNTER — Inpatient Hospital Stay (HOSPITAL_COMMUNITY): Admitting: Certified Registered Nurse Anesthetist

## 2024-02-20 DIAGNOSIS — Z7982 Long term (current) use of aspirin: Secondary | ICD-10-CM | POA: Insufficient documentation

## 2024-02-20 DIAGNOSIS — N189 Chronic kidney disease, unspecified: Secondary | ICD-10-CM | POA: Diagnosis not present

## 2024-02-20 DIAGNOSIS — I10 Essential (primary) hypertension: Secondary | ICD-10-CM

## 2024-02-20 DIAGNOSIS — K440 Diaphragmatic hernia with obstruction, without gangrene: Secondary | ICD-10-CM | POA: Diagnosis present

## 2024-02-20 DIAGNOSIS — Z96661 Presence of right artificial ankle joint: Secondary | ICD-10-CM | POA: Insufficient documentation

## 2024-02-20 DIAGNOSIS — Z96662 Presence of left artificial ankle joint: Secondary | ICD-10-CM | POA: Insufficient documentation

## 2024-02-20 DIAGNOSIS — I129 Hypertensive chronic kidney disease with stage 1 through stage 4 chronic kidney disease, or unspecified chronic kidney disease: Secondary | ICD-10-CM | POA: Diagnosis not present

## 2024-02-20 DIAGNOSIS — Z79899 Other long term (current) drug therapy: Secondary | ICD-10-CM | POA: Diagnosis not present

## 2024-02-20 DIAGNOSIS — K449 Diaphragmatic hernia without obstruction or gangrene: Secondary | ICD-10-CM

## 2024-02-20 DIAGNOSIS — M810 Age-related osteoporosis without current pathological fracture: Secondary | ICD-10-CM | POA: Insufficient documentation

## 2024-02-20 DIAGNOSIS — Z8719 Personal history of other diseases of the digestive system: Secondary | ICD-10-CM

## 2024-02-20 HISTORY — PX: ESOPHAGOGASTRODUODENOSCOPY: SHX5428

## 2024-02-20 HISTORY — PX: XI ROBOTIC ASSISTED PARAESOPHAGEAL HERNIA REPAIR: SHX6871

## 2024-02-20 MED ORDER — ROCURONIUM BROMIDE 10 MG/ML (PF) SYRINGE
PREFILLED_SYRINGE | INTRAVENOUS | Status: AC
  Filled 2024-02-20: qty 10

## 2024-02-20 MED ORDER — BUPIVACAINE HCL (PF) 0.5 % IJ SOLN
INTRAMUSCULAR | Status: AC
  Filled 2024-02-20: qty 30

## 2024-02-20 MED ORDER — POLYVINYL ALCOHOL 1.4 % OP SOLN: 1.0000 [drp] | Freq: Three times a day (TID) | OPHTHALMIC | Status: DC | PRN

## 2024-02-20 MED ORDER — FLUTICASONE PROPIONATE 50 MCG/ACT NA SUSP
2.0000 | Freq: Every day | NASAL | Status: DC
  Administered 2024-02-21: 2 via NASAL
  Filled 2024-02-20: qty 16

## 2024-02-20 MED ORDER — HYDRALAZINE HCL 20 MG/ML IJ SOLN
10.0000 mg | Freq: Four times a day (QID) | INTRAMUSCULAR | Status: DC | PRN
Start: 2024-02-20 — End: 2024-02-21
  Administered 2024-02-20: 10 mg via INTRAVENOUS
  Filled 2024-02-20: qty 1

## 2024-02-20 MED ORDER — PROPOFOL 10 MG/ML IV BOLUS
INTRAVENOUS | Status: AC
  Filled 2024-02-20: qty 20

## 2024-02-20 MED ORDER — ONDANSETRON HCL 4 MG/2ML IJ SOLN
INTRAMUSCULAR | Status: AC
  Filled 2024-02-20: qty 2

## 2024-02-20 MED ORDER — PHENYLEPHRINE HCL-NACL 20-0.9 MG/250ML-% IV SOLN
INTRAVENOUS | Status: DC | PRN
  Administered 2024-02-20: 30 ug/min via INTRAVENOUS

## 2024-02-20 MED ORDER — 0.9 % SODIUM CHLORIDE (POUR BTL) OPTIME
TOPICAL | Status: DC | PRN
  Administered 2024-02-20: 2000 mL

## 2024-02-20 MED ORDER — ORAL CARE MOUTH RINSE: 15.0000 mL | Freq: Once | OROMUCOSAL | Status: AC

## 2024-02-20 MED ORDER — ENOXAPARIN SODIUM 40 MG/0.4ML IJ SOSY
40.0000 mg | PREFILLED_SYRINGE | INTRAMUSCULAR | Status: DC
  Administered 2024-02-21: 40 mg via SUBCUTANEOUS
  Filled 2024-02-20: qty 0.4

## 2024-02-20 MED ORDER — ONDANSETRON HCL 4 MG/2ML IJ SOLN: 4.0000 mg | Freq: Once | INTRAMUSCULAR | Status: DC | PRN

## 2024-02-20 MED ORDER — PHENYLEPHRINE 80 MCG/ML (10ML) SYRINGE FOR IV PUSH (FOR BLOOD PRESSURE SUPPORT)
PREFILLED_SYRINGE | INTRAVENOUS | Status: AC
  Filled 2024-02-20: qty 10

## 2024-02-20 MED ORDER — MORPHINE SULFATE (PF) 2 MG/ML IV SOLN
2.0000 mg | INTRAVENOUS | Status: DC | PRN
  Administered 2024-02-20 – 2024-02-21 (×7): 2 mg via INTRAVENOUS
  Filled 2024-02-20 (×8): qty 1

## 2024-02-20 MED ORDER — FENTANYL CITRATE (PF) 100 MCG/2ML IJ SOLN
INTRAMUSCULAR | Status: AC
  Filled 2024-02-20: qty 2

## 2024-02-20 MED ORDER — LIDOCAINE 2% (20 MG/ML) 5 ML SYRINGE
INTRAMUSCULAR | Status: DC | PRN
  Administered 2024-02-20: 100 mg via INTRAVENOUS

## 2024-02-20 MED ORDER — LACTATED RINGERS IV SOLN: INTRAVENOUS | Status: DC | PRN

## 2024-02-20 MED ORDER — SUGAMMADEX SODIUM 200 MG/2ML IV SOLN
INTRAVENOUS | Status: DC | PRN
  Administered 2024-02-20: 175.8 mg via INTRAVENOUS

## 2024-02-20 MED ORDER — PHENYLEPHRINE 80 MCG/ML (10ML) SYRINGE FOR IV PUSH (FOR BLOOD PRESSURE SUPPORT)
PREFILLED_SYRINGE | INTRAVENOUS | Status: DC | PRN
  Administered 2024-02-20: 40 ug via INTRAVENOUS
  Administered 2024-02-20 (×2): 80 ug via INTRAVENOUS
  Administered 2024-02-20: 40 ug via INTRAVENOUS
  Administered 2024-02-20 (×2): 80 ug via INTRAVENOUS

## 2024-02-20 MED ORDER — VANCOMYCIN HCL IN DEXTROSE 1-5 GM/200ML-% IV SOLN
1000.0000 mg | INTRAVENOUS | Status: AC
  Administered 2024-02-20: 1000 mg via INTRAVENOUS
  Filled 2024-02-20: qty 200

## 2024-02-20 MED ORDER — FENTANYL CITRATE (PF) 100 MCG/2ML IJ SOLN
25.0000 ug | INTRAMUSCULAR | Status: DC | PRN
  Administered 2024-02-20 (×2): 50 ug via INTRAVENOUS

## 2024-02-20 MED ORDER — LIDOCAINE 2% (20 MG/ML) 5 ML SYRINGE
INTRAMUSCULAR | Status: AC
  Filled 2024-02-20: qty 5

## 2024-02-20 MED ORDER — ACETAMINOPHEN 10 MG/ML IV SOLN: 1000.0000 mg | Freq: Four times a day (QID) | INTRAVENOUS | Status: AC | PRN

## 2024-02-20 MED ORDER — CEFAZOLIN SODIUM-DEXTROSE 2-4 GM/100ML-% IV SOLN
2.0000 g | Freq: Three times a day (TID) | INTRAVENOUS | Status: AC
  Administered 2024-02-20: 2 g via INTRAVENOUS
  Filled 2024-02-20: qty 100

## 2024-02-20 MED ORDER — PROPOFOL 10 MG/ML IV BOLUS
INTRAVENOUS | Status: DC | PRN
  Administered 2024-02-20: 80 mg via INTRAVENOUS
  Administered 2024-02-20: 20 ug/kg/min via INTRAVENOUS

## 2024-02-20 MED ORDER — ROCURONIUM BROMIDE 10 MG/ML (PF) SYRINGE
PREFILLED_SYRINGE | INTRAVENOUS | Status: DC | PRN
  Administered 2024-02-20: 20 mg via INTRAVENOUS
  Administered 2024-02-20: 30 mg via INTRAVENOUS
  Administered 2024-02-20: 10 mg via INTRAVENOUS
  Administered 2024-02-20: 20 mg via INTRAVENOUS
  Administered 2024-02-20: 50 mg via INTRAVENOUS

## 2024-02-20 MED ORDER — FENTANYL CITRATE (PF) 100 MCG/2ML IJ SOLN
INTRAMUSCULAR | Status: DC | PRN
  Administered 2024-02-20 (×2): 50 ug via INTRAVENOUS

## 2024-02-20 MED ORDER — SODIUM CHLORIDE 0.9 % IV SOLN: 8.0000 mg | Freq: Four times a day (QID) | INTRAVENOUS | Status: DC | PRN

## 2024-02-20 MED ORDER — ONDANSETRON HCL 4 MG/2ML IJ SOLN
INTRAMUSCULAR | Status: DC | PRN
  Administered 2024-02-20: 4 mg via INTRAVENOUS

## 2024-02-20 MED ORDER — ACETAMINOPHEN 500 MG PO TABS
500.0000 mg | ORAL_TABLET | Freq: Once | ORAL | Status: AC
  Administered 2024-02-20: 500 mg via ORAL

## 2024-02-20 MED ORDER — BUPIVACAINE LIPOSOME 1.3 % IJ SUSP
INTRAMUSCULAR | Status: AC
  Filled 2024-02-20: qty 20

## 2024-02-20 MED ORDER — CHLORHEXIDINE GLUCONATE 0.12 % MT SOLN
15.0000 mL | Freq: Once | OROMUCOSAL | Status: AC
  Administered 2024-02-20: 15 mL via OROMUCOSAL
  Filled 2024-02-20: qty 15

## 2024-02-20 MED ORDER — LACTATED RINGERS IV SOLN: INTRAVENOUS | Status: DC

## 2024-02-20 MED ORDER — BUPIVACAINE LIPOSOME 1.3 % IJ SUSP: INTRAMUSCULAR | Status: DC | PRN

## 2024-02-20 MED ORDER — ACETAMINOPHEN 500 MG PO TABS
1000.0000 mg | ORAL_TABLET | Freq: Once | ORAL | Status: DC
  Filled 2024-02-20: qty 2

## 2024-02-20 SURGICAL SUPPLY — 3 items
DRIVER NDL LRG 8 DVNC XI (INSTRUMENTS) IMPLANT
DRIVER NDL MEGA SUTCUT DVNCXI (INSTRUMENTS) IMPLANT
NDL HYPO 22X1.5 SAFETY MO (MISCELLANEOUS) ×3 IMPLANT

## 2024-02-20 NOTE — Transfer of Care (Signed)
 Immediate Anesthesia Transfer of Care Note  Patient: Tracey Walton  Procedure(s) Performed: REPAIR, HERNIA, PARAESOPHAGEAL, ROBOT-ASSISTED USING OVITEX REINFORCED TISSUE MATRIX (Chest) EGD (ESOPHAGOGASTRODUODENOSCOPY) OMENTOPEXY, ROBOTIC ASSISTED LAPAROSCOPIC (Abdomen)  Patient Location: PACU  Anesthesia Type:General  Level of Consciousness: awake, alert , and confused  Airway & Oxygen Therapy: Patient Spontanous Breathing and Patient connected to nasal cannula oxygen  Post-op Assessment: Report given to RN, Post -op Vital signs reviewed and stable, Patient moving all extremities X 4, and Patient able to stick tongue midline  Post vital signs: Reviewed and stable  Last Vitals:  Vitals Value Taken Time  BP 159/62   Temp 98.2   Pulse 93 02/20/24 11:57  Resp 17 02/20/24 11:57  SpO2 82 % 02/20/24 11:57  Vitals shown include unfiled device data.  Last Pain:  Vitals:   02/20/24 0814  TempSrc:   PainSc: 0-No pain      Patients Stated Pain Goal: 1 (02/20/24 0708)  Complications: No notable events documented.

## 2024-02-20 NOTE — Care Management CC44 (Signed)
 Condition Code 44 Documentation Completed  Patient Details  Name: LYNDAL ALAMILLO MRN: [id number] Date of Birth: [birth year]   Condition Code 44 given:    Patient signature on Condition Code 44 notice:    Documentation of 2 MD's agreement:    Code 44 added to claim:       Antwain Caliendo G Kennieth Plotts, RN 02/20/2024, 3:18 PM

## 2024-02-20 NOTE — Care Management Obs Status (Signed)
 MEDICARE OBSERVATION STATUS NOTIFICATION   Patient Details  Name: Tracey Walton MRN: [id number] Date of Birth: May 27, 1940   Medicare Observation Status Notification Given:  Yes Ob notice signed and copy given   Claretta Deed 02/20/2024, 2:57 PM

## 2024-02-20 NOTE — Anesthesia Postprocedure Evaluation (Signed)
 Anesthesia Post Note  Patient: Tracey Walton  Procedure(s) Performed: REPAIR, HERNIA, PARAESOPHAGEAL, ROBOT-ASSISTED USING OVITEX REINFORCED TISSUE MATRIX (Chest) EGD (ESOPHAGOGASTRODUODENOSCOPY) OMENTOPEXY, ROBOTIC ASSISTED LAPAROSCOPIC (Abdomen)     Patient location during evaluation: PACU Anesthesia Type: General Level of consciousness: awake and alert Pain management: pain level controlled Vital Signs Assessment: post-procedure vital signs reviewed and stable Respiratory status: spontaneous breathing, nonlabored ventilation, respiratory function stable and patient connected to nasal cannula oxygen Cardiovascular status: stable and blood pressure returned to baseline Anesthetic complications: no   No notable events documented.  Last Vitals:  Vitals:   02/20/24 1215 02/20/24 1230  BP: (!) 153/71 (!) 142/69  Pulse: 83 78  Resp: (!) 24 16  Temp:    SpO2: 91% 95%    Last Pain:  Vitals:   02/20/24 1246  TempSrc:   PainSc: 6                  Debby FORBES Like

## 2024-02-20 NOTE — Op Note (Signed)
 [address] [place] [address]             [phone]      02/20/2024  Patient:  Tracey Walton Pre-Op Dx: paraesophageal hernia   Post-op Dx:  same Procedure: - Esophagoscopy - Robotic assisted laparoscopy - Paraesophageal hernia repair with Ovitex mesh pledgets - Gastropexy   Surgeon and Role:      * Joandy Burget, Linnie KIDD, MD - Primary  Assistant: CHARLENA Shad, PA-C  An experienced assistant was required given the complexity of this surgery and the standard of surgical care. The assistant was needed for exposure, dissection, suctioning, retraction of delicate tissues and sutures, instrument exchange and for overall help during this procedure.   Anesthesia  general EBL:  10ml Blood Administration: none Specimen:  none   Counts: correct   Indications: 83 year old female with a large paraesophageal hernia. She also has an elevated right hemidiaphragm which could be contributing to some of her shortness of breath. After undergoing a TAVR she continues to be dyspneic, thus I think that the bulk of her symptoms is due to to her hernia. We discussed the risks and benefits of an EGD followed by a robotic assisted paraesophageal hernia repair with toupee fundoplication. She is agreeable to proceed.   Findings: Large hernia with stomach.  Wide-mouthed defect.  Lateral plication was requires for closure  Operative Technique: After the risks, benefits and alternatives were thoroughly discussed, the patient was brought to the operative theatre.  Anesthesia was induced, and the esophagoscope was passed through the oropharynx down to the stomach.  The scope was retroflexed and the hiatal hernia was clearly evident.  The scope was pulled back and the mucosal surface of the esophagus was visualized.    The scope was then parked at 25 cm from the incisors.  The patient was then prepped and draped in normal sterile fashion.  An appropriate surgical pause was  performed, and pre-operative antibiotics were dosed accordingly.  We began with a 1 cm incision 15 cm caudad from the xiphoid and slightly lateral to the umbilicus.  Using an Optiview we entered the peritoneal space.  The abdomen was then insufflated with CO2.  3 other robotic ports were placed to triangulate the hiatus.  Another 12 mm port was placed in place at the level of the umbilicus laterally for an assistant port and another 5 mm trocar was placed in the right lower quadrant for liver retractor.  The patient was then placed in steep reverse Trendelenburg and the liver was elevated to expose the esophageal hiatus.  And then the robot was docked.  We began by dividing the gastrohepatic ligament to expose the right diaphragmatic crus and then dissected the hernia sac in a clockwise fashion to mobilize there the stomach and esophagus.  We then divided the short gastrics and moved towards the right crus and completed our dissection along the esophageal hiatus.  A Penrose drain was then used to encircle the the esophagus and we continued our dissection up into the mediastinum.  Once we had achieved 3 to 4 cm of intra-abdominal esophagus we then proceeded to reapproximate the crura with 0 Ethibond sutures in an interrupted fashion.  The gastroscope was passed down through the lower esophageal sphincter into the stomach and would act as our bougie during this repair.  Next the stomach was pexied ot the anterior abdominal wall. The liver retractor was removed and all ports were  removed under direct visualization.  The skin and soft tissue were closed with absorbable suture    The patient tolerated the procedure without any immediate complications, and was transferred to the PACU in stable condition.  Samael Blades MALVA Rayas

## 2024-02-20 NOTE — Discharge Summary (Signed)
 [address] [place] [address]             [phone]        Physician Discharge Summary  Patient ID: Tracey Walton MRN: [id number] DOB/AGE: 1940-10-10 83 y.o.  Admit date: 02/20/2024 Discharge date: 02/21/2024  Admission Diagnoses:  Patient Active Problem List   Diagnosis Date Noted   Hiatal hernia 01/18/2024   OAB (overactive bladder) 12/20/2023   S/P TAVR (transcatheter aortic valve replacement) 12/04/2023   Callus 04/19/2023   Plantar flexed metatarsal bone of right foot 04/19/2023   Allergic rhinitis 12/27/2021   Bilateral lower extremity edema 10/31/2020   Recurrent UTI (urinary tract infection) 06/07/2020   Vertigo 05/12/2019   Porokeratosis 03/26/2019   Depression 11/07/2017   Obesity with body mass index of 30.0-39.9 11/07/2017   Long-term current use of opiate analgesic 07/25/2017   S/p reverse total shoulder arthroplasty 10/12/2016   Severe aortic stenosis 07/06/2014   Hyperlipidemia 05/29/2014   Essential hypertension 05/29/2014   Polymyalgia 01/04/2013   Chronic low back pain 09/30/2012   Discharge Diagnoses:   Patient Active Problem List   Diagnosis Date Noted   S/P Robotic Assisted Laparotomoy with Repair of Hiatal Hernia 02/20/2024   S/P repair of paraesophageal hernia 02/20/2024   Hiatal hernia 01/18/2024   OAB (overactive bladder) 12/20/2023   S/P TAVR (transcatheter aortic valve replacement) 12/04/2023   Callus 04/19/2023   Plantar flexed metatarsal bone of right foot 04/19/2023   Allergic rhinitis 12/27/2021   Bilateral lower extremity edema 10/31/2020   Recurrent UTI (urinary tract infection) 06/07/2020   Vertigo 05/12/2019   Porokeratosis 03/26/2019   Depression 11/07/2017   Obesity with body mass index of 30.0-39.9 11/07/2017   Long-term current use of opiate analgesic 07/25/2017   S/p reverse total shoulder arthroplasty 10/12/2016   Severe aortic stenosis 07/06/2014   Hyperlipidemia 05/29/2014    Essential hypertension 05/29/2014   Polymyalgia 01/04/2013   Chronic low back pain 09/30/2012   Discharged Condition: good  History of Present Illness:  Tracey Walton is an 83 yo female with known history of Aortic Stenosis S/P TAVR, GERD, HLD, and Class 2 Obesity.  She was incidentally found to have a large hiatal hernia in July of this year when she was undergoing TAVR workup.  She presented to Dr. Shyrl for evaluation at which time she complained of significant reflux, dysphagia, and worsening shortness of breath with meals and liquids.  She is unable to lay flat and admits to sleeping in a recliner for the past several years.  Dr. Shyrl recommended surgical repair for symptomatic relief.  The risks and benefits of the procedure were explained to the patient and she was agreeable to proceed.  Hospital Course:  Tracey Walton presented to [facility] on [facility]20/2024.  She was taken to the operating room and underwent Robotic Assisted Laparoscopic Hiatal hernia Repair using Ovitex Mesh.  She tolerated the procedure without difficulty, was extubated, and taken to the PACU in stable condition.  The patient did well post operatively.  She has some mild incisional discomfort.  She denied N/V.  Esophogram was performed and showed no evidence of leak.  She was started on dysphagia diet.  She has ambulated without difficulty.  Surgical incisions are healing without difficulty.  She is medically stable for discharge home today.  Consults: None  Significant Diagnostic Studies: radiology:   CT scan:    ADDENDUM REPORT: 10/30/2023 17:33  EXAM: OVER-READ INTERPRETATION CT CHEST  The following report is an over-read performed by radiologist Dr. Rogelia Hammans [facility] on 10/30/2023. This over-read does not include interpretation of cardiac or coronary anatomy or pathology. The coronary CTA interpretation by the cardiologist is attached. ...  Signed by Carlean Rogelia CROME, MD on 10/30/2023  5:35 PM       CLINICAL DATA:  Aortic valve replacement (TAVR), pre-op eval  EXAM: Cardiac TAVR CT  TECHNIQUE: A non-contrast, gated CT scan was obtained with axial slices of 2.5 mm through the heart for aortic valve scoring. A 120 kV retrospective, gated, contrast cardiac scan was obtained. Gantry rotation speed was 230 msec and collimation was 0.63 mm. ...   Study Result  Narrative & Impression  CLINICAL DATA:  Preoperative aortic valve replacement   EXAM: CT ANGIOGRAPHY CHEST, ABDOMEN AND PELVIS   TECHNIQUE: Multidetector CT imaging through the chest, abdomen and pelvis was performed using the standard protocol during bolus administration of intravenous contrast. Multiplanar reconstructed images and MIPs were obtained and reviewed to evaluate the vascular anatomy.   RADIATION DOSE REDUCTION: This exam was performed according to the departmental dose-optimization program which includes automated exposure control, adjustment of the mA and/or kV according to patient size and/or use of iterative reconstruction technique.   CONTRAST:  OMNIPAQUE  IOHEXOL  350 MG/ML SOLN   COMPARISON:  None Available.   FINDINGS: CTA CHEST FINDINGS   Cardiovascular: Mild cardiomegaly. No aortic dissection or aneurysm. Central pulmonary vasculature is mildly dilated. No central pulmonary arterial filling defects. Aortic and coronary artery atherosclerosis.   Mediastinum/Nodes: No axillary, mediastinal, or hilar lymphadenopathy. Multinodular thyroid  gland with largest nodule measuring 1.7 cm. Large hiatal hernia. Trachea within normal limits.   Lungs/Pleura: Mild bibasilar atelectasis. Lungs are otherwise clear. No pleural effusion or pneumothorax.   Musculoskeletal: Bilateral shoulder prostheses. No acute bony or chest wall abnormality.   Review of the MIP images confirms the above findings.   CTA ABDOMEN AND PELVIS FINDINGS   VASCULAR    Moderate-severe aortoiliac atherosclerosis without aneurysm. Negative for aortic dissection. Greater than 50% stenosis at the origins of the bilateral renal arteries secondary to calcified atherosclerotic plaque. No additional sites of significant stenosis.   Review of the MIP images confirms the above findings.   NON-VASCULAR   Hepatobiliary: No focal liver abnormality. Tiny layering stones in the gallbladder. No pericholecystic inflammatory changes by CT. No ductal dilatation.   Pancreas: Unremarkable. No pancreatic ductal dilatation or surrounding inflammatory changes.   Spleen: Normal in size without focal abnormality.   Adrenals/Urinary Tract: Unremarkable adrenal glands. Bilateral renal cysts, requiring no follow-up imaging. No solid renal lesion, stone, or hydronephrosis. Urinary bladder is partially distended.   Stomach/Bowel: Large hiatal hernia containing the entirety of the stomach within the chest. No dilated loops of bowel. No bowel wall thickening or inflammatory changes. Extensive colonic diverticulosis.   Lymphatic: No abdominopelvic lymphadenopathy.   Reproductive: Status post hysterectomy. No adnexal masses.   Other: No free fluid. No abdominopelvic fluid collection. No pneumoperitoneum. No abdominal wall hernia.   Musculoskeletal: Degenerative lumbar spondylosis. Thoracolumbar scoliosis. No acute osseous abnormality.   Review of the MIP images confirms the above findings.   IMPRESSION: 1. No acute findings within the chest, abdomen, or pelvis. 2. Aortic atherosclerosis without aneurysm or dissection. 3. Greater than 50% stenosis at the origins of the bilateral renal arteries secondary to calcified atherosclerotic plaque. 4. Large hiatal hernia containing the entirety of the stomach within the chest. 5. Cholelithiasis  without evidence of acute cholecystitis. 6. Extensive colonic diverticulosis without evidence of acute diverticulitis. 7.  Multinodular thyroid  gland. Recommend thyroid  US  (ref: J Am Coll Radiol. 2015 Feb;12(2): 143-50). 8. Aortic and coronary artery atherosclerosis (ICD10-I70.0).    Electronically Signed   By: Mabel Converse D.O.   On: 11/02/2023 10:11  EXAM: ESOPHAGUS/BARIUM SWALLOW/TABLET STUDY   TECHNIQUE: Single contrast examination was performed using water soluble contrast. This exam was performed by Laymon Coast, NP, and was supervised and interpreted by Dr. Marcey Moan.   FLUOROSCOPY: Radiation Exposure Index (as provided by the fluoroscopic device): 45.2 mGy Kerma   COMPARISON:  03/06/14 DG Esophagus   FINDINGS: Swallowing: Appears normal. No vestibular penetration or aspiration seen.   Pharynx: Unremarkable.   Esophagus: Dilated appearance.   Esophageal motility: Esophageal dysmotility seen with associated contrast stasis.   Hiatal Hernia: None. GE junction appearance consistent with status post hernia repair with wrap. No evidence of contrast extravasation.   Gastroesophageal reflux: None visualized; however, exam limited by patient inability to completely clear contrast stasis in distal esophagus.   Ingested 13 mm barium tablet: Not given.   Other: Radiopaque device seen consistent with prior TAVR on 12/04/23.   IMPRESSION: 1. Esophageal dysmotility seen with associated contrast stasis.   2. Exam consistent with status post hernia repair without evidence of contrast extravasation.     Electronically Signed   By: Marcey Moan M.D.   On: 02/21/2024 14:38   Treatments: surgery:   04/22/2023   Patient:  Tracey Walton Pre-Op Dx: paraesophageal hernia   Post-op Dx:  same Procedure: - Esophagoscopy - Robotic assisted laparoscopy - Paraesophageal hernia repair with Ovitex mesh pledgets - Gastropexy     Surgeon and Role:      * Lightfoot, Linnie KIDD, MD - Primary   Assistant: CHARLENA Shad, PA-C  An experienced assistant was required given the  complexity of this surgery and the standard of surgical care. The assistant was needed for exposure, dissection, suctioning, retraction of delicate tissues and sutures, instrument exchange and for overall help during this procedure.   Discharge Exam: Blood pressure (!) 153/75, pulse 98, temperature 97.9 F (36.6 C), temperature source Oral, resp. rate 20, height 5' (1.524 m), weight 87.9 kg, SpO2 92%.  General appearance: alert, cooperative, and no distress Heart: regular rate and rhythm Lungs: clear to auscultation bilaterally Abdomen: soft non-distended, non-tender Extremities: extremities normal, atraumatic, no cyanosis or edema Wound: clean and dry  Discharge disposition: 01-Home or Self Care   Allergies as of 02/21/2024       Reactions   Oxycodone  Hcl Other (See Comments)   Penicillins Rash   Pt tolerated 2g ancef  without issue 10/12/16        Medication List     TAKE these medications    amLODipine  5 MG tablet Commonly known as: NORVASC  TAKE 1 TABLET BY MOUTH DAILY   aspirin  EC 81 MG tablet Take 1 tablet (81 mg total) by mouth daily. Swallow whole.   azithromycin  500 MG tablet Commonly known as: Zithromax  Take 1 tablet (500 mg total) by mouth as directed. Take one tablet 1 hour before any dental work including cleanings.   baclofen  10 MG tablet Commonly known as: LIORESAL  Take 10 mg by mouth 3 (three) times daily as needed for muscle spasms.   carvedilol  6.25 MG tablet Commonly known as: COREG  TAKE 1 TABLET BY MOUTH TWICE  DAILY   cetirizine 10 MG tablet Commonly known as: ZYRTEC Take 10 mg by mouth daily.  ferrous sulfate  325 (65 FE) MG EC tablet Take 325 mg by mouth 3 (three) times a week.   FLUoxetine  20 MG capsule Commonly known as: PROZAC  TAKE 1 CAPSULE BY MOUTH DAILY What changed: Another medication with the same name was removed. Continue taking this medication, and follow the directions you see here.   fluticasone  50 MCG/ACT nasal  spray Commonly known as: FLONASE  Place 2 sprays into both nostrils daily.   HYDROcodone -acetaminophen  5-325 MG tablet Commonly known as: NORCO/VICODIN Take 2 tablets by mouth 3 (three) times daily as needed for moderate pain (pain score 4-6).   omeprazole  20 MG capsule Commonly known as: PRILOSEC TAKE 1 CAPSULE BY MOUTH DAILY   Polyethyl Glycol-Propyl Glycol 0.4-0.3 % Soln Place 1 drop into both eyes 3 (three) times daily as needed (for dry/irritated eyes.).   polyethylene glycol 17 g packet Commonly known as: MIRALAX  / GLYCOLAX  Take 17 g by mouth daily.   rosuvastatin  20 MG tablet Commonly known as: CRESTOR  TAKE 1 TABLET BY MOUTH DAILY   solifenacin  5 MG tablet Commonly known as: VESICARE  Take 1 tablet (5 mg total) by mouth daily.   trimethoprim  100 MG tablet Commonly known as: TRIMPEX  Take 100 mg by mouth at bedtime.   valsartan  40 MG tablet Commonly known as: DIOVAN  TAKE ONE-HALF TABLET BY MOUTH  DAILY        Follow-up Information     Shyrl Linnie KIDD, MD Follow up on 03/07/2024.   Specialty: Cardiothoracic Surgery Why: Appointment is at 2:10... This is a virtual appointment.  Dr. Shyrl will call you, you do not need to report to the office Contact information: [address] [place] [address] [phone]                 Signed: Rocky Shad, PA-C  02/22/2024, 8:59 AM

## 2024-02-20 NOTE — Anesthesia Procedure Notes (Signed)
 Procedure Name: Intubation Date/Time: 02/20/2024 8:50 AM  Performed by: Harrold Macintosh, CRNAPre-anesthesia Checklist: Patient identified, Emergency Drugs available, Suction available and Patient being monitored Patient Re-evaluated:Patient Re-evaluated prior to induction Oxygen Delivery Method: Circle system utilized Preoxygenation: Pre-oxygenation with 100% oxygen Induction Type: IV induction Ventilation: Mask ventilation without difficulty Laryngoscope Size: Glidescope and 3 Grade View: Grade I Tube type: Oral Tube size: 7.0 mm Number of attempts: 1 Airway Equipment and Method: Video-laryngoscopy and Rigid stylet Placement Confirmation: ETT inserted through vocal cords under direct vision, positive ETCO2 and breath sounds checked- equal and bilateral Secured at: 20 cm Tube secured with: Tape Dental Injury: Teeth and Oropharynx as per pre-operative assessment  Difficulty Due To: Difficulty was unanticipated Comments: Elective glidescope for limited C-spine ROM

## 2024-02-20 NOTE — Brief Op Note (Signed)
 02/20/2024  11:43 AM  PATIENT:  Tracey Walton  83 y.o. female  PRE-OPERATIVE DIAGNOSIS:  hiatal hernia  POST-OPERATIVE DIAGNOSIS:  hiatal hernia  PROCEDURE:  Procedure(s) with comments: REPAIR, HERNIA, PARAESOPHAGEAL, ROBOT-ASSISTED USING OVITEX REINFORCED TISSUE MATRIX (N/A) - with fundoplication EGD (ESOPHAGOGASTRODUODENOSCOPY) (N/A) OMENTOPEXY, ROBOTIC ASSISTED LAPAROSCOPIC (N/A)  SURGEON:  Surgeons and Role:    * Lightfoot, Linnie KIDD, MD - Primary  PHYSICIAN ASSISTANT: Rocky Shad PA-C  ASSISTANTS: none   ANESTHESIA:   general  EBL:  Minimal   BLOOD ADMINISTERED:none  DRAINS: none   LOCAL MEDICATIONS USED:  BUPIVICAINE   SPECIMEN:  Source of Specimen:  Hernia Sack  DISPOSITION OF SPECIMEN:  PATHOLOGY  COUNTS:  YES  TOURNIQUET:  * No tourniquets in log *  DICTATION: .Dragon Dictation  PLAN OF CARE: Admit for overnight observation  PATIENT DISPOSITION:  PACU - hemodynamically stable.   Delay start of Pharmacological VTE agent (>24hrs) due to surgical blood loss or risk of bleeding: no

## 2024-02-20 NOTE — Care Management Obs Status (Signed)
 MEDICARE OBSERVATION STATUS NOTIFICATION   Patient Details  Name: TIFFANIE BLASSINGAME MRN: [id number] Date of Birth: [birth year]   Medicare Observation Status Notification Given:  Yes    Roxie KANDICE Stain, RN 02/20/2024, 3:18 PM

## 2024-02-20 NOTE — H&P (Signed)
 [address]       [place] [address]             [phone]                                                   FLORICE HINDLE [facility] Medical Record #[id number] Date of Birth: Sep 05, 1940   Referring: Wendel Lurena POUR, MD Primary Care: Chandra Toribio POUR, MD Primary Cardiologist: Arun K Thukkani, MD   Chief Complaint:        Chief Complaint  Patient presents with   Hiatal Hernia      CTA Chest 10/25/23      History of Present Illness:    Tracey Walton 83 y.o. female presents for surgical evaluation of a paraesophageal hernia.  She previously underwent a TAVR on September 2 of this year, but continues to have shortness of breath.  She also complains of significant reflux, weekly dysphagia, and worsening shortness of breath with meals and liquids.  She denies any odynophagia.  She is unable to lay flat, and has been sleeping in a recliner for several years.               Past Medical History:  Diagnosis Date   Anxiety     Arthritis      qwhere (08/09/2017)   Chest pain 2011    negative for MI   Chronic kidney disease      renal insufficiency. 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 down after her knee surgery back in 2008   Chronic lower back pain     Depression     GERD (gastroesophageal reflux disease)     History of hiatal hernia     Hyperlipidemia     Hypertension     Osteoporosis     Polymyalgia     S/P TAVR (transcatheter aortic valve replacement) 12/04/2023    s/p TAVR with a 26 mm Edwards Sapien 3 Ultra Resilia THV via the TF approach by Dr. Wendel and Dr. Shyrl   Severe aortic stenosis     Vertigo                 Past Surgical History:  Procedure Laterality Date   ABDOMINAL HYSTERECTOMY        partial   BILATERAL CARPAL TUNNEL RELEASE   2003-2005    right-left   CATARACT EXTRACTION W/ INTRAOCULAR LENS  IMPLANT, BILATERAL Bilateral     INTRAOPERATIVE TRANSTHORACIC ECHOCARDIOGRAM N/A 12/04/2023    Procedure: ECHOCARDIOGRAM,  TRANSTHORACIC;  Surgeon: Thukkani, Arun K, MD;  Location: [facility] IN[facility];  Service: Cardiovascular;  Laterality: N/A;   JOINT REPLACEMENT       REVERSE SHOULDER ARTHROPLASTY Left 10/12/2016    Procedure: REVERSE LEFT SHOULDER ARTHROPLASTY;  Surgeon: Melita Drivers, MD;  Location: [facility] OR;  Service: Orthopedics;  Laterality: Left;   REVERSE SHOULDER ARTHROPLASTY Right 08/09/2017    Procedure: RIGHT REVERSE SHOULDER ARTHROPLASTY;  Surgeon: Melita Drivers, MD;  Location: [facility] OR;  Service: Orthopedics;  Laterality: Right;   RIGHT HEART CATH AND CORONARY ANGIOGRAPHY N/A 10/19/2023    Procedure: RIGHT HEART CATH AND CORONARY ANGIOGRAPHY;  Surgeon: Wendel Lurena POUR, MD;  Location: [facility];  Service: Cardiovascular;  Laterality: N/A;   SHOULDER ARTHROSCOPY Right 2003   TOOTH EXTRACTION  1 tooth (08/09/2017)   TOTAL KNEE ARTHROPLASTY Bilateral                 Family History  Problem Relation Age of Onset   Heart attack Mother     Heart disease Mother     Hypertension Father     Heart failure Father     Kidney failure Father     Hyperlipidemia Father     Stroke Father     Hypertension Sister     Hyperlipidemia Sister              Tobacco Use History  Social History        Tobacco Use  Smoking Status Never   Passive exposure: Never  Smokeless Tobacco Never      Social History       Substance and Sexual Activity  Alcohol Use Never        Allergies       Allergies  Allergen Reactions   Oxycodone  Hcl Other (See Comments)   Penicillins Rash      Pt tolerated 2g ancef  without issue 10/12/16              Current Outpatient Medications  Medication Sig Dispense Refill   amLODipine  (NORVASC ) 5 MG tablet TAKE 1 TABLET BY MOUTH DAILY 90 tablet 3   aspirin  EC 81 MG tablet Take 1 tablet (81 mg total) by mouth daily. Swallow whole.       azithromycin  (ZITHROMAX ) 500 MG tablet Take 1 tablet (500 mg total) by mouth as directed. Take one tablet 1 hour before any  dental work including cleanings. 6 tablet 12   baclofen  (LIORESAL ) 10 MG tablet Take 10 mg by mouth daily as needed for muscle spasms.       carvedilol  (COREG ) 6.25 MG tablet TAKE 1 TABLET BY MOUTH TWICE  DAILY 180 tablet 3   cetirizine (ZYRTEC) 10 MG tablet Take 10 mg by mouth daily.       FLUoxetine  (PROZAC ) 10 MG capsule Take 1 capsule (10 mg total) by mouth daily. 30 capsule 1   FLUoxetine  (PROZAC ) 20 MG capsule Take 1 capsule (20 mg total) by mouth daily. 90 capsule 3   fluticasone  (FLONASE ) 50 MCG/ACT nasal spray Place 2 sprays into both nostrils daily. 48 g 2   HYDROcodone -acetaminophen  (NORCO/VICODIN) 5-325 MG tablet Take 2 tablets by mouth every 4 (four) hours as needed for moderate pain (pain score 4-6).       omeprazole  (PRILOSEC) 20 MG capsule TAKE 1 CAPSULE BY MOUTH DAILY 100 capsule 2   Polyethyl Glycol-Propyl Glycol 0.4-0.3 % SOLN Place 1 drop into both eyes 3 (three) times daily as needed (for dry/irritated eyes.).       rosuvastatin  (CRESTOR ) 20 MG tablet TAKE 1 TABLET BY MOUTH DAILY 90 tablet 3   solifenacin  (VESICARE ) 5 MG tablet Take 1 tablet (5 mg total) by mouth daily. 90 tablet 1   trimethoprim  (TRIMPEX ) 100 MG tablet Take 100 mg by mouth at bedtime.       valsartan  (DIOVAN ) 40 MG tablet TAKE ONE-HALF TABLET BY MOUTH  DAILY 50 tablet 2      No current facility-administered medications for this visit.        Review of Systems  Respiratory:  Positive for shortness of breath.   Cardiovascular:  Negative for chest pain.  Gastrointestinal:  Positive for abdominal pain and heartburn. Negative for nausea.       PHYSICAL EXAMINATION: BP (!) 149/70  Pulse 60   Resp 20   Ht 5' 4 (1.626 m)   Wt 189 lb (85.7 kg)   SpO2 93% Comment: RA  BMI 32.44 kg/m  Physical Exam Constitutional:      General: She is not in acute distress.    Appearance: She is not ill-appearing.  Eyes:     Extraocular Movements: Extraocular movements intact.  Cardiovascular:     Rate and  Rhythm: Normal rate.  Musculoskeletal:        General: Normal range of motion.     Cervical back: Normal range of motion.  Skin:    General: Skin is warm and dry.  Neurological:     General: No focal deficit present.     Mental Status: She is alert and oriented to person, place, and time.       Diagnostic Studies & Laboratory data:              I have independently reviewed the above radiology studies  and reviewed the findings with the patient.    Recent Lab Findings: Recent Labs       Lab Results  Component Value Date    WBC 9.3 12/07/2023    HGB 11.6 (L) 12/07/2023    HCT 36.2 12/07/2023    PLT 146 (L) 12/07/2023    GLUCOSE 118 (H) 12/07/2023    CHOL 162 07/12/2023    TRIG 85 07/12/2023    HDL 44 07/12/2023    LDLCALC 102 (H) 07/12/2023    ALT 8 12/07/2023    AST 19 12/07/2023    NA 137 12/07/2023    K 3.7 12/07/2023    CL 101 12/07/2023    CREATININE 1.24 (H) 12/07/2023    BUN 34 (H) 12/07/2023    CO2 24 12/07/2023    TSH 0.991 02/01/2023    INR 1.0 11/30/2023    HGBA1C 5.4 07/12/2023           Assessment / Plan:   83 year old female with a large paraesophageal hernia.  She also has an elevated right hemidiaphragm which could be contributing to some of her shortness of breath.  After undergoing a TAVR she continues to be dyspneic, thus I think that the bulk of her symptoms is due to to her hernia.  We discussed the risks and benefits of an EGD followed by a robotic assisted paraesophageal hernia repair with toupee fundoplication.  She is agreeable to proceed.

## 2024-02-20 NOTE — Plan of Care (Signed)
  Problem: Clinical Measurements: Goal: Diagnostic test results will improve Outcome: Progressing Goal: Respiratory complications will improve Outcome: Progressing Goal: Cardiovascular complication will be avoided Outcome: Progressing   Problem: Activity: Goal: Risk for activity intolerance will decrease Outcome: Progressing   Problem: Coping: Goal: Level of anxiety will decrease Outcome: Progressing   Problem: Pain Managment: Goal: General experience of comfort will improve and/or be controlled Outcome: Progressing   Problem: Safety: Goal: Ability to remain free from injury will improve Outcome: Progressing

## 2024-02-20 NOTE — Hospital Course (Addendum)
 History of Present Illness:  Tracey Walton is an 83 yo female with known history of Aortic Stenosis S/P TAVR, GERD, HLD, and Class 2 Obesity.  She was incidentally found to have a large hiatal hernia in July of this year when she was undergoing TAVR workup.  She presented to Dr. Shyrl for evaluation at which time she complained of significant reflux, dysphagia, and worsening shortness of breath with meals and liquids.  She is unable to lay flat and admits to sleeping in a recliner for the past several years.  Dr. Shyrl recommended surgical repair for symptomatic relief.  The risks and benefits of the procedure were explained to the patient and she was agreeable to proceed.  Hospital Course:  Tracey Walton presented to [facility] on [facility]24.  She was taken to the operating room and underwent Robotic Assisted Laparoscopic Hiatal hernia Repair using Ovitex Mesh.  She tolerated the procedure without difficulty, was extubated, and taken to the PACU in stable condition.  The patient did well post operatively.  She has some mild incisional discomfort.  She denied N/V.  Esophogram was performed and showed no evidence of leak.  She was started on dysphagia diet.  She has ambulated without difficulty.  Surgical incisions are healing without difficulty.  She is medically stable for discharge home today.

## 2024-02-21 ENCOUNTER — Observation Stay (HOSPITAL_COMMUNITY)

## 2024-02-21 ENCOUNTER — Encounter (HOSPITAL_COMMUNITY): Payer: Self-pay | Admitting: Thoracic Surgery (Cardiothoracic Vascular Surgery)

## 2024-02-21 DIAGNOSIS — K440 Diaphragmatic hernia with obstruction, without gangrene: Secondary | ICD-10-CM | POA: Diagnosis not present

## 2024-02-21 LAB — BASIC METABOLIC PANEL WITH GFR
Anion gap: 14 (ref 5–15)
BUN: 16 mg/dL (ref 8–23)
CO2: 25 mmol/L (ref 22–32)
Calcium: 9.1 mg/dL (ref 8.9–10.3)
Chloride: 101 mmol/L (ref 98–111)
Creatinine, Ser: 1.07 mg/dL — ABNORMAL HIGH (ref 0.44–1.00)
GFR, Estimated: 52 mL/min — ABNORMAL LOW (ref >60)
Glucose, Bld: 114 mg/dL — ABNORMAL HIGH (ref 70–99)
Potassium: 4.6 mmol/L (ref 3.5–5.1)
Sodium: 140 mmol/L (ref 135–145)

## 2024-02-21 LAB — CBC
HCT: 38.5 % (ref 36.0–46.0)
Hemoglobin: 12.4 g/dL (ref 12.0–15.0)
MCH: 30.5 pg (ref 26.0–34.0)
MCHC: 32.2 g/dL (ref 30.0–36.0)
MCV: 94.8 fL (ref 80.0–100.0)
Platelets: 205 K/uL (ref 150–400)
RBC: 4.06 MIL/uL (ref 3.87–5.11)
RDW: 13.4 % (ref 11.5–15.5)
WBC: 11 K/uL — ABNORMAL HIGH (ref 4.0–10.5)
nRBC: 0 % (ref 0.0–0.2)

## 2024-02-21 LAB — SURGICAL PATHOLOGY

## 2024-02-21 MED ORDER — FUROSEMIDE 10 MG/ML IJ SOLN
20.0000 mg | Freq: Once | INTRAMUSCULAR | Status: AC
  Administered 2024-02-21: 20 mg via INTRAVENOUS
  Filled 2024-02-21: qty 2

## 2024-02-21 MED ORDER — METOPROLOL TARTRATE 5 MG/5ML IV SOLN
5.0000 mg | Freq: Once | INTRAVENOUS | Status: AC
  Administered 2024-02-21: 5 mg via INTRAVENOUS
  Filled 2024-02-21: qty 5

## 2024-02-21 MED ORDER — IOHEXOL 300 MG/ML  SOLN
100.0000 mL | Freq: Once | INTRAMUSCULAR | Status: AC | PRN
  Administered 2024-02-21: 100 mL via ORAL

## 2024-02-21 NOTE — Progress Notes (Signed)
Patient provided with verbal discharge instructions. Paper copy of discharge provided to patient. RN answered all questions. VSS at discharge. IV's removed. Patient belongings sent with patient. Patient dc'd via wheelchair to private vehicle.

## 2024-02-21 NOTE — Progress Notes (Signed)
 Nurse requested Mobility Specialist to perform oxygen saturation test with pt which includes removing pt from oxygen both at rest and while ambulating.  Below are the results from that testing.     Patient Saturations on Room Air at Rest = spO2 91%  Patient Saturations on Room Air while Ambulating = sp02 85% .    Patient Saturations on 2 Liters of oxygen while Ambulating = sp02 92%  At end of testing pt left in room on 0  Liters of oxygen.  Reported results to nurse.    Lauraine Erm Mobility Specialist Please contact via SecureChat or [employer] [phone]

## 2024-02-21 NOTE — TOC Initial Note (Signed)
 Transition of Care [facility]) - Initial/Assessment Note    Patient Details  Name: Tracey Walton MRN: [id number] Date of Birth: May 30, 1940  [facility]) CM/SW Contact:    Roxie KANDICE Stain, RN Phone Number: 02/21/2024, 9:17 AM  Clinical Narrative:                  Spoke to patient and daughter, Tracey Walton, at bedside regarding transition needs.  Patient lives with daughter and has a walker. Daughter assists with transportation needs. Address, Phone number and PCP verified. No ICM (Inpatient Care Management) needs at this time.  Expected Discharge Plan: Home/Self Care Barriers to Discharge: Continued Medical Work up   Patient Goals and CMS Choice Patient states their goals for this hospitalization and ongoing recovery are:: return home w/dtr          Expected Discharge Plan and Services       Living arrangements for the past 2 months: Single Family Home                                      Prior Living Arrangements/Services Living arrangements for the past 2 months: Single Family Home Lives with:: Adult Children Patient language and need for interpreter reviewed:: Yes Do you feel safe going back to the place where you live?: Yes      Need for Family Participation in Patient Care: Yes (Comment) Care giver support system in place?: Yes (comment) Current home services: DME Criminal Activity/Legal Involvement Pertinent to Current Situation/Hospitalization: No - Comment as needed  Activities of Daily Living   ADL Screening (condition at time of admission) Independently performs ADLs?: Yes (appropriate for developmental age) Is the patient deaf or have difficulty hearing?: No Does the patient have difficulty seeing, even when wearing glasses/contacts?: No Does the patient have difficulty concentrating, remembering, or making decisions?: No  Permission Sought/Granted                  Emotional Assessment Appearance:: Appears stated  age Attitude/Demeanor/Rapport: Engaged Affect (typically observed): Accepting Orientation: : Oriented to Self, Oriented to Place, Oriented to  Time, Oriented to Situation Alcohol  / Substance Use: Not Applicable Psych Involvement: No (comment)  Admission diagnosis:  Hiatal hernia [K44.9] S/P repair of paraesophageal hernia [S01.109, Z87.19] Patient Active Problem List   Diagnosis Date Noted   S/P Robotic Assisted Laparotomoy with Repair of Hiatal Hernia 02/20/2024   S/P repair of paraesophageal hernia 02/20/2024   Hiatal hernia 01/18/2024   OAB (overactive bladder) 12/20/2023   S/P TAVR (transcatheter aortic valve replacement) 12/04/2023   Callus 04/19/2023   Plantar flexed metatarsal bone of right foot 04/19/2023   Allergic rhinitis 12/27/2021   Bilateral lower extremity edema 10/31/2020   Recurrent UTI (urinary tract infection) 06/07/2020   Vertigo 05/12/2019   Porokeratosis 03/26/2019   Depression 11/07/2017   Obesity with body mass index of 30.0-39.9 11/07/2017   Long-term current use of opiate analgesic 07/25/2017   S/p reverse total shoulder arthroplasty 10/12/2016   Severe aortic stenosis 07/06/2014   Hyperlipidemia 05/29/2014   Essential hypertension 05/29/2014   Polymyalgia 01/04/2013   Chronic low back pain 09/30/2012   PCP:  Chandra Toribio POUR, MD Pharmacy:   [facility] - [place], [place] - [address] [place] [address] Phone: [phone] Fax: [phone]  [facility] - [address] [place], [place] - [address] [place] [address] Phone: [phone] Fax: [phone]     Social Drivers of Health (SDOH) Social History: SDOH Screenings   Food Insecurity: No Food Insecurity (02/20/2024)  Housing: Low Risk  (02/20/2024)  Transportation Needs: No Transportation Needs (02/20/2024)  Utilities: Not At Risk (02/20/2024)  Alcohol  Screen: Low Risk   (05/03/2023)  Depression (PHQ2-9): Medium Risk (12/20/2023)  Financial Resource Strain: Low Risk  (09/04/2023)  Physical Activity: Inactive (09/04/2023)  Social Connections: Socially Isolated (02/20/2024)  Stress: No Stress Concern Present (09/04/2023)  Tobacco Use: Low Risk  (02/20/2024)  Health Literacy: Adequate Health Literacy (05/03/2023)   SDOH Interventions:     Readmission Risk Interventions    12/05/2023    3:48 PM  Readmission Risk Prevention Plan  Post Dischage Appt Complete  Medication Screening Complete  Transportation Screening Complete

## 2024-02-21 NOTE — TOC Transition Note (Signed)
 Transition of Care [facility]) - Discharge Note   Patient Details  Name: Tracey Walton MRN: [id number] Date of Birth: 1940/12/03  [facility] CM/SW Contact:  Roxie KANDICE Stain, RN Phone Number: 02/21/2024, 3:22 PM   Clinical Narrative:    Tracey Walton is stable to discharge home. Follow up apt on AVS. No ICM (Inpatient Care Management) needs at this time.    Final next level of care: Home/Self Care Barriers to Discharge: Barriers Resolved   Patient Goals and CMS Choice Patient states their goals for this hospitalization and ongoing recovery are:: return home w/dtr          Discharge Placement               home        Discharge Plan and Services Additional resources added to the After Visit Summary for                                       Social Drivers of Health (SDOH) Interventions SDOH Screenings   Food Insecurity: No Food Insecurity (02/20/2024)  Housing: Low Risk  (02/20/2024)  Transportation Needs: No Transportation Needs (02/20/2024)  Utilities: Not At Risk (02/20/2024)  Alcohol Screen: Low Risk  (05/03/2023)  Depression (PHQ2-9): Medium Risk (12/20/2023)  Financial Resource Strain: Low Risk  (09/04/2023)  Physical Activity: Inactive (09/04/2023)  Social Connections: Socially Isolated (02/20/2024)  Stress: No Stress Concern Present (09/04/2023)  Tobacco Use: Low Risk  (02/20/2024)  Health Literacy: Adequate Health Literacy (05/03/2023)     Readmission Risk Interventions    02/21/2024    3:20 PM 12/05/2023    3:48 PM  Readmission Risk Prevention Plan  Post Dischage Appt Complete Complete  Medication Screening Complete Complete  Transportation Screening Complete Complete

## 2024-02-21 NOTE — Discharge Instructions (Addendum)
 CRUSH MEDS  Please wash incisions daily with soap and water  Activity: up as tolerated, walk at least 3 times daily.Avoid heavy lifting for 6 weeks

## 2024-02-21 NOTE — Progress Notes (Signed)
      [address] [place] [address]             [phone]        Patient's esophogram completed.  There was no evidence of leak present.  There is some delay in passage of contrast likely related to edema.  She has been advanced on dysphagia diet and tolerated oral intake without N/V.  Patient feels well and would like to go home today.  She needs to ambulate in hallway off oxygen, will perform and if she continues to remain stable will plan for discharge   Rocky Shad, PA-C 11:34 AM 02/21/24

## 2024-02-21 NOTE — Progress Notes (Addendum)
      [address] [place] [address]             [phone]         1 Day Post-Op Procedure(s) (LRB): REPAIR, HERNIA, PARAESOPHAGEAL, ROBOT-ASSISTED USING OVITEX REINFORCED TISSUE MATRIX (N/A) EGD (ESOPHAGOGASTRODUODENOSCOPY) (N/A) OMENTOPEXY, ROBOTIC ASSISTED LAPAROSCOPIC (N/A)  Subjective:  Patient having some soreness across her belly.  Denies N/V  Objective: Vital signs in last 24 hours: Temp:  [98.2 F (36.8 C)-99.5 F (37.5 C)] 98.7 F (37.1 C) (11/20 0348) Pulse Rate:  [78-108] 88 (11/20 0348) Cardiac Rhythm: Normal sinus rhythm (11/20 0700) Resp:  [16-24] 22 (11/20 0348) BP: (139-177)/(59-85) 153/67 (11/20 0348) SpO2:  [91 %-96 %] 93 % (11/20 0348)  Intake/Output from previous day: 11/19 0701 - 11/20 0700 In: 1500 [I.V.:1500] Out: 1110 [Urine:1100; Blood:10]  General appearance: alert, cooperative, and no distress Heart: regular rate and rhythm Lungs: clear to auscultation bilaterally Abdomen: soft non-distended, non-tender Extremities: extremities normal, atraumatic, no cyanosis or edema Wound: clean and dry  Lab Results: Recent Labs    02/18/24 1030 02/21/24 0234  WBC 7.0 11.0*  HGB 12.2 12.4  HCT 37.7 38.5  PLT 240 205   BMET:  Recent Labs    02/18/24 1030 02/21/24 0234  NA 134* 140  K 4.6 4.6  CL 99 101  CO2 25 25  GLUCOSE 95 114*  BUN 22 16  CREATININE 1.11* 1.07*  CALCIUM  8.6* 9.1    PT/INR:  Recent Labs    02/18/24 1030  LABPROT 14.3  INR 1.1   ABG    Component Value Date/Time   PHART 7.396 10/19/2023 1254   HCO3 25.5 10/19/2023 1302   TCO2 26 12/04/2023 1316   ACIDBASEDEF 1.0 10/19/2023 1254   O2SAT 65 10/19/2023 1302   CBG (last 3)  No results for input(s): GLUCAP in the last 72 hours.  Assessment/Plan: S/P Procedure(s) (LRB): REPAIR, HERNIA, PARAESOPHAGEAL, ROBOT-ASSISTED USING OVITEX REINFORCED TISSUE MATRIX (N/A) EGD (ESOPHAGOGASTRODUODENOSCOPY) (N/A) OMENTOPEXY, ROBOTIC ASSISTED  LAPAROSCOPIC (N/A)  CV- S/P TAVR for AS.SABRA H/O HTN- continue prn Hydralazine  for now.. will resume home meds if swallow is clear Pulm- no acute issues, will wean oxygen as tolerated Renal- creatinine stable at 1.07 GI- NPO for now, Esophogram is pending ID- mild leukocytosis, likely reactive DIspo- patient stable, doing well, will await esophogram to be completed..if no evidence of leak.. will start diet and she feels good to be discharged home later today   LOS: 1 day    Rocky Shad, PA-C 02/21/2024 7:25 AM

## 2024-02-22 ENCOUNTER — Telehealth: Payer: Self-pay | Admitting: *Deleted

## 2024-02-22 ENCOUNTER — Other Ambulatory Visit: Payer: Self-pay | Admitting: *Deleted

## 2024-02-22 MED ORDER — ONDANSETRON 4 MG PO TBDP: 8.0000 mg | ORAL_TABLET | Freq: Three times a day (TID) | ORAL | 0 refills | Status: AC | PRN

## 2024-02-22 NOTE — Telephone Encounter (Signed)
-----   Message from Rocky Shad sent at 02/22/2024  9:00 AM EST ----- Hi,  I discharged this patient yesterday and forgot to provide her zofran .  Would one of you be willing to reach out to her and get a prescription sent to her pharmacy?  Zofran  8 mg.. 1 tab every 8 hours as needed for N/V   Thanks   Rocky

## 2024-02-22 NOTE — Telephone Encounter (Signed)
 Patient made aware of Zofran  being sent to preferred pharmacy per E. Barrett, PA.

## 2024-03-03 ENCOUNTER — Telehealth: Payer: Self-pay

## 2024-03-03 NOTE — Telephone Encounter (Signed)
 Copied from CRM [id number]. Topic: Appointments - Appointment Scheduling >> Mar 03, 2024 10:15 AM [name] wrote: Pls call patient.. needs to orders for labs prior to her 12/29th checkup.Tracey Walton

## 2024-03-04 NOTE — Telephone Encounter (Signed)
 Is she wanting specific labs?  I don't have any labs that I need before her visit.

## 2024-03-05 NOTE — Telephone Encounter (Signed)
 Called patient she stated that she does not need any labs done

## 2024-03-05 NOTE — Telephone Encounter (Signed)
 Called pt unable to LVM if patient call please advise

## 2024-03-06 ENCOUNTER — Telehealth: Payer: Self-pay

## 2024-03-06 ENCOUNTER — Other Ambulatory Visit: Payer: Self-pay

## 2024-03-06 ENCOUNTER — Ambulatory Visit (INDEPENDENT_AMBULATORY_CARE_PROVIDER_SITE_OTHER)

## 2024-03-06 ENCOUNTER — Other Ambulatory Visit (INDEPENDENT_AMBULATORY_CARE_PROVIDER_SITE_OTHER)

## 2024-03-06 DIAGNOSIS — R300 Dysuria: Secondary | ICD-10-CM

## 2024-03-06 LAB — POCT URINALYSIS DIP (CLINITEK)
Bilirubin, UA: NEGATIVE
Glucose, UA: NEGATIVE mg/dL
Ketones, POC UA: NEGATIVE mg/dL
Nitrite, UA: NEGATIVE
POC PROTEIN,UA: NEGATIVE
Spec Grav, UA: 1.01 (ref 1.010–1.025)
Urobilinogen, UA: 0.2 U/dL
pH, UA: 5.5 (ref 5.0–8.0)

## 2024-03-06 MED ORDER — CEPHALEXIN 500 MG PO CAPS: 500.0000 mg | ORAL_CAPSULE | Freq: Three times a day (TID) | ORAL | 0 refills | Status: DC

## 2024-03-06 NOTE — Telephone Encounter (Signed)
 Copied from CRM [id number]. Topic: Clinical - Request for Lab/Test Order >> Mar 06, 2024  9:17 AM Berneda FALCON wrote: Reason for CRM: Patient states she feels like she has a UTI as of this morning. She states she is hurting and when she urinates, her fingers burn. States this is normal and frequent for her and she usually knows when the UTI's are coming. She states she has her grandchildren and is unable to leave and wants to know if her daughter in law can bring in a urine sample so that she can have something called in for her today, please.  Patient callback is [phone] (home)

## 2024-03-06 NOTE — Telephone Encounter (Signed)
 If the daughter has a sterile cup, yes they can bring in a urine sample. If not, the daughter can come by and pick up a sterile cup and she can bring back the urine sample.

## 2024-03-06 NOTE — Telephone Encounter (Signed)
 Called patient she is advise daughter will bring in urine sample

## 2024-03-06 NOTE — Progress Notes (Signed)
 Patient has an uti

## 2024-03-06 NOTE — Telephone Encounter (Signed)
Called pt she is advised of her lab results recommendation

## 2024-03-06 NOTE — Telephone Encounter (Signed)
 Patient's urine sample did show signs of a UTI. I am going to send it for culture to make sure the antibiotic choice is appropriate. But I would like her to go ahead and start Keflex . I have sent this into her local pharmacy, [facility]. It is taken three times a day for 5 days with food.  We will be in touch with her when the culture is back. If symptoms do not improve, please tell her to call us  back.

## 2024-03-07 ENCOUNTER — Ambulatory Visit
Payer: Self-pay | Attending: Thoracic Surgery (Cardiothoracic Vascular Surgery) | Admitting: Thoracic Surgery (Cardiothoracic Vascular Surgery)

## 2024-03-07 DIAGNOSIS — Z8719 Personal history of other diseases of the digestive system: Secondary | ICD-10-CM

## 2024-03-07 DIAGNOSIS — Z9889 Other specified postprocedural states: Secondary | ICD-10-CM

## 2024-03-07 NOTE — Progress Notes (Signed)
     [address] [place] [address]             [phone]     Patient: Home Provider: Office Consent for Telemedicine visit obtained.  Today's visit was completed via a real-time telehealth (see specific modality noted below). The patient/authorized person provided oral consent at the time of the visit to engage in a telemedicine encounter with the present provider at [facility]. The patient/authorized person was informed of the potential benefits, limitations, and risks of telemedicine. The patient/authorized person expressed understanding that the laws that protect confidentiality also apply to telemedicine. The patient/authorized person acknowledged understanding that telemedicine does not provide emergency services and that he or she would need to call 911 or proceed to the nearest hospital for help if such a need arose.   Total time spent in the clinical discussion 10 minutes.  Telehealth Modality: Phone visit (audio only)  I had a telephone visit with Asberry LULLA Eagles Overall she is doing well.  Her breathing is much improved.  She occasionally has some dysphagia.  She has advanced her diet, and is no longer on her ant-acids.  She will follow-up in 1 month with a cxr.  Veryl Winemiller MALVA Rayas

## 2024-03-10 ENCOUNTER — Ambulatory Visit: Payer: Self-pay

## 2024-03-10 LAB — URINE CULTURE

## 2024-03-13 ENCOUNTER — Ambulatory Visit: Admitting: Family Medicine

## 2024-03-19 ENCOUNTER — Other Ambulatory Visit: Payer: Self-pay | Admitting: Family Medicine

## 2024-03-31 ENCOUNTER — Ambulatory Visit: Admitting: Family Medicine

## 2024-03-31 ENCOUNTER — Encounter: Payer: Self-pay | Admitting: Family Medicine

## 2024-03-31 VITALS — BP 166/77 | HR 63 | Ht 60.0 in | Wt 188.0 lb

## 2024-03-31 DIAGNOSIS — E041 Nontoxic single thyroid nodule: Secondary | ICD-10-CM | POA: Diagnosis not present

## 2024-03-31 DIAGNOSIS — I10 Essential (primary) hypertension: Secondary | ICD-10-CM

## 2024-03-31 DIAGNOSIS — L600 Ingrowing nail: Secondary | ICD-10-CM | POA: Insufficient documentation

## 2024-03-31 DIAGNOSIS — E782 Mixed hyperlipidemia: Secondary | ICD-10-CM

## 2024-03-31 DIAGNOSIS — Z9889 Other specified postprocedural states: Secondary | ICD-10-CM | POA: Diagnosis not present

## 2024-03-31 DIAGNOSIS — E669 Obesity, unspecified: Secondary | ICD-10-CM

## 2024-03-31 DIAGNOSIS — Z8719 Personal history of other diseases of the digestive system: Secondary | ICD-10-CM | POA: Diagnosis not present

## 2024-03-31 NOTE — Patient Instructions (Signed)
" °  VISIT SUMMARY: Today, you came in for a follow-up visit to manage your blood pressure. We also discussed your recent hiatal hernia procedure, thyroid  nodule, and an issue with your toenail.  YOUR PLAN: HYPERTENSION: Your blood pressure readings at home are generally in the 120s to 130s systolic range, with a recent reading of 126/65 mmHg. -Record your blood pressure readings at home for a few days and report the results to us .  INGROWN TOENAIL: You have redness and pain in your toenail area, likely due to an ingrown toenail. -Soak the affected toe in soapy water. -Consult with a podiatrist for potential removal of the ingrown nail portion.  THYROID  NODULE: You have a thyroid  nodule and have decided against a fine needle aspiration biopsy at this time. -Continue to monitor the thyroid  nodule without biopsy.  HIATAL HERNIA, POST-PROCEDURE: You have had a recent hiatal hernia repair and report significant improvement in your breathing and heart symptoms. -No current action needed as you are doing well post-procedure.  HYPERLIPIDEMIA: You have a history of high cholesterol. -Continue taking your current medications as prescribed.    "

## 2024-03-31 NOTE — Assessment & Plan Note (Signed)
 Redness and pain upon touch. No signs of infection, likely irritation and inflammation. - Soak the affected toe in soapy water. - Consult with a podiatrist for potential removal of the ingrown nail portion.

## 2024-03-31 NOTE — Assessment & Plan Note (Signed)
 Presence of a thyroid  nodule with previous recommendation for fine needle aspiration biopsy. She declined biopsy due to personal preference and concerns about potential cancer diagnosis. Discussed the non-invasive nature of the procedure and risks of deferring diagnostic procedure. Pt verbalized understanding of the risks. - Continue to monitor thyroid  nodule without biopsy.

## 2024-03-31 NOTE — Assessment & Plan Note (Signed)
 Post-procedure status following hiatal hernia repair. Reports significant improvement in breathing and heart symptoms.

## 2024-03-31 NOTE — Progress Notes (Unsigned)
" ° °  Established Patient Office Visit  Subjective   Patient ID: Tracey Walton, female    DOB: 19-Aug-1940  Age: 83 y.o. MRN: [id number]  Chief Complaint  Patient presents with   Medical Management of Chronic Issues    History of Present Illness   Tracey Walton is an 83 year old female who presents for follow-up and blood pressure management.  She has experienced significant improvement in her breathing following a recent hiatal hernia procedure and no longer requires medication for reflux.  She has a history of thyroid  nodules and was contacted regarding a fine needle aspiration biopsy. However, she has decided against pursuing further intervention at this time, citing her age and past experiences with her husband's illness as influencing factors.  She monitors her blood pressure at home, noting readings typically in the 120s to 130s systolic range, with a recent reading of 126/65 mmHg. She attributes occasional elevations to a possible ingrown toenail. Current medications include amlodipine , carvedilol , valsartan , and rosuvastatin . She is not currently taking any medication for reflux post-hiatal hernia procedure.  She lives independently and has a supportive family network, including four great-grandchildren. She does not drive and relies on family for transportation to appointments. Reports pain and redness in the toenail area, possibly due to an ingrown toenail.       The ASCVD Risk score (Arnett DK, et al., 2019) failed to calculate for the following reasons:   The 2019 ASCVD risk score is only valid for ages 50 to 31   * - Cholesterol units were assumed  Health Maintenance Due  Topic Date Due   Bone Density Scan  Never done   COVID-19 Vaccine (5 - 2025-26 season) 12/03/2023   Medicare Annual Wellness (AWV)  05/02/2024      Objective:     BP (!) 166/77   Pulse 63   Ht 5' (1.524 m)   Wt 188 lb (85.3 kg)   SpO2 96%   BMI 36.72 kg/m    Physical Exam   Gen:  alert, oriented Pulm: no respiratory distress Psych: pleasant affect EXTREMITIES: mild swelling and erythema of the right great toe on the medial aspect of nail       No results found for any visits on 03/31/24.      Assessment & Plan:   Essential hypertension Assessment & Plan: Blood pressure readings at home are generally in the 120s to 130s systolic range. Recent reading was 126/65 mmHg. - Record blood pressure readings at home for a few days and report results.   Ingrown toenail Assessment & Plan: Redness and pain upon touch. No signs of infection, likely irritation and inflammation. - Soak the affected toe in soapy water. - Consult with a podiatrist for potential removal of the ingrown nail portion.   Thyroid  nodule Assessment & Plan: Presence of a thyroid  nodule with previous recommendation for fine needle aspiration biopsy. She declined biopsy due to personal preference and concerns about potential cancer diagnosis. Discussed the non-invasive nature of the procedure and risks of deferring diagnostic procedure. Pt verbalized understanding of the risks. - Continue to monitor thyroid  nodule without biopsy.   S/P repair of paraesophageal hernia Assessment & Plan: Post-procedure status following hiatal hernia repair. Reports significant improvement in breathing and heart symptoms.         Return in about 6 months (around 09/29/2024) for physical.    Toribio MARLA Slain, MD  "

## 2024-03-31 NOTE — Assessment & Plan Note (Signed)
 Blood pressure readings at home are generally in the 120s to 130s systolic range. Recent reading was 126/65 mmHg. - Record blood pressure readings at home for a few days and report results.

## 2024-04-04 ENCOUNTER — Encounter: Payer: Self-pay | Admitting: Podiatry

## 2024-04-04 ENCOUNTER — Ambulatory Visit: Admitting: Podiatry

## 2024-04-04 DIAGNOSIS — L600 Ingrowing nail: Secondary | ICD-10-CM

## 2024-04-04 NOTE — Progress Notes (Signed)
 This patient presents to the office with chief complaint of an ingrown toenail on the outside border right big toe.  She says she has been soaking her foot and the pain has resolved.  She denies pus or drainage from the right big toe.  She presents for an evaluation and treatment.  Vascular  Dorsalis pedis and posterior tibial pulses are palpable  B/L.  Capillary return  WNL.  Temperature gradient is  WNL.  Skin turgor  WNL  Sensorium  Senn Weinstein monofilament wire  WNL. Normal tactile sensation.  Nail Exam  Patient has normal nails with no evidence of bacterial or fungal infection. She has pincer toenail right hallux.  No pus or drainage noted.  Orthopedic  Exam  Muscle tone and muscle strength  WNL.  No limitations of motion feet  B/L.  No crepitus or joint effusion noted.  Foot type is unremarkable and digits show no abnormalities.  Bony prominences are unremarkable.  Skin  No open lesions.  Normal skin texture and turgor.   Ingrown Toenail with no infection.  ROV.  Debride pincer toenail and debridement lateral border right hallux.  RTC 6 months   Cordella Bold DPM

## 2024-04-10 ENCOUNTER — Other Ambulatory Visit: Payer: Self-pay | Admitting: Thoracic Surgery (Cardiothoracic Vascular Surgery)

## 2024-04-10 DIAGNOSIS — K449 Diaphragmatic hernia without obstruction or gangrene: Secondary | ICD-10-CM

## 2024-04-11 ENCOUNTER — Ambulatory Visit

## 2024-04-11 ENCOUNTER — Ambulatory Visit (HOSPITAL_COMMUNITY)
Admission: RE | Admit: 2024-04-11 | Discharge: 2024-04-11 | Disposition: A | Source: Ambulatory Visit | Attending: Cardiology | Admitting: Cardiology

## 2024-04-11 VITALS — BP 132/70 | HR 68 | Resp 20 | Ht 60.0 in | Wt 189.2 lb

## 2024-04-11 DIAGNOSIS — K449 Diaphragmatic hernia without obstruction or gangrene: Secondary | ICD-10-CM | POA: Insufficient documentation

## 2024-04-11 DIAGNOSIS — Z9889 Other specified postprocedural states: Secondary | ICD-10-CM

## 2024-04-11 DIAGNOSIS — Z8719 Personal history of other diseases of the digestive system: Secondary | ICD-10-CM

## 2024-04-11 NOTE — Patient Instructions (Signed)
 Follow up as needed

## 2024-04-11 NOTE — Progress Notes (Signed)
 "     [address] [place]acey [place] [address]             [phone]       HPI:  Patient returns for routine postoperative follow-up having undergone esophagoscopy, robotic assisted laparoscopy, paraesophageal hernia repair with Ovitex mesh pledgets and gastropexy on 02/20/2024 with Dr. Shyrl.  The patient's early postoperative recovery while in the hospital was notable for having an esophogram performed which showed no evidence of leak. Tracey Walton was started on a dysphagia diet. Tracey Walton was stable for discharge home on 02/21/2024.   Since hospital discharge the patient reports that Tracey Walton has been doing well.  Tracey Walton does report having some dysphagia when attempting to swallow a hamburger and sausage patty. Tracey Walton has not needed her acid reflux medication. Tracey Walton does report having one episode of reflux but this was after eating a spaghetti bake. Tracey Walton denies regurgitation. Tracey Walton has advanced her diet back to regular.   Allergies as of 04/11/2024       Reactions   Oxycodone  Hcl Other (See Comments)   Penicillins Rash   Pt tolerated 2g ancef  without issue 10/12/16        Medication List        Accurate as of April 11, 2024 12:03 PM. If you have any questions, ask your nurse or doctor.          amLODipine  5 MG tablet Commonly known as: NORVASC  TAKE 1 TABLET BY MOUTH DAILY   aspirin  EC 81 MG tablet Take 1 tablet (81 mg total) by mouth daily. Swallow whole.   azithromycin  500 MG tablet Commonly known as: Zithromax  Take 1 tablet (500 mg total) by mouth as directed. Take one tablet 1 hour before any dental work including cleanings.   baclofen  10 MG tablet Commonly known as: LIORESAL  Take 10 mg by mouth 3 (three) times daily as needed for muscle spasms.   carvedilol  6.25 MG tablet Commonly known as: COREG  TAKE 1 TABLET BY MOUTH TWICE  DAILY   cetirizine 10 MG tablet Commonly known as: ZYRTEC Take 10 mg by mouth daily.   ferrous sulfate  325 (65 FE) MG EC tablet Take 325  mg by mouth 3 (three) times a week.   FLUoxetine  20 MG capsule Commonly known as: PROZAC  TAKE 1 CAPSULE BY MOUTH DAILY   fluticasone  50 MCG/ACT nasal spray Commonly known as: FLONASE  Place 2 sprays into both nostrils daily.   HYDROcodone -acetaminophen  5-325 MG tablet Commonly known as: NORCO/VICODIN Take 2 tablets by mouth 3 (three) times daily as needed for moderate pain (pain score 4-6).   omeprazole  20 MG capsule Commonly known as: PRILOSEC TAKE 1 CAPSULE BY MOUTH DAILY   ondansetron  4 MG disintegrating tablet Commonly known as: ZOFRAN -ODT Take 2 tablets (8 mg total) by mouth every 8 (eight) hours as needed for nausea or vomiting.   Polyethyl Glycol-Propyl Glycol 0.4-0.3 % Soln Place 1 drop into both eyes 3 (three) times daily as needed (for dry/irritated eyes.).   polyethylene glycol 17 g packet Commonly known as: MIRALAX  / GLYCOLAX  Take 17 g by mouth daily.   rosuvastatin  20 MG tablet Commonly known as: CRESTOR  TAKE 1 TABLET BY MOUTH DAILY   solifenacin  5 MG tablet Commonly known as: VESICARE  TAKE 1 TABLET BY MOUTH DAILY   trimethoprim  100 MG tablet Commonly known as: TRIMPEX  Take 100 mg by mouth at bedtime.   valsartan  40 MG tablet Commonly known as: DIOVAN  TAKE ONE-HALF TABLET BY MOUTH  DAILY  ROS Review of Systems  Constitutional:  Negative for fever and malaise/fatigue.  Respiratory:  Negative for shortness of breath.   Cardiovascular:  Negative for chest pain and leg swelling.  Gastrointestinal:  Negative for abdominal pain, nausea and vomiting.      BP 132/70 (BP Location: Left Arm, Patient Position: Sitting, Cuff Size: Normal)   Pulse 68   Resp 20   Ht 5' (1.524 m)   Wt 189 lb 3.2 oz (85.8 kg)   SpO2 93% Comment: RA  BMI 36.95 kg/m    Physical Exam Constitutional:      Appearance: Normal appearance.  HENT:     Head: Normocephalic and atraumatic.  Cardiovascular:     Rate and Rhythm: Normal rate and regular rhythm.      Heart sounds: Normal heart sounds, S1 normal and S2 normal.  Pulmonary:     Effort: Pulmonary effort is normal.     Breath sounds: Normal breath sounds.  Musculoskeletal:     Cervical back: Normal range of motion.  Skin:    General: Skin is warm and dry.     Comments: Incision sites healed   Neurological:     General: No focal deficit present.     Mental Status: Tracey Walton is alert.      Imaging: CLINICAL DATA:  Postop from TAVR.   EXAM: CHEST - 2 VIEW   COMPARISON:  02/18/2024   FINDINGS: The heart size and mediastinal contours are within normal limits. Prior TAVR again noted. Stable moderate elevation of right hemidiaphragm. Both lungs are clear. Bilateral shoulder prostheses again noted.   IMPRESSION: Stable elevation of right hemidiaphragm. No active cardiopulmonary disease.     Electronically Signed   By: Norleen DELENA Kil M.D.   On: 04/11/2024 11:41   Assessment/Plan:  S/P repair of paraesophageal hernia -We reviewed today's chest xray.  -Discussed that Tracey Walton should continue to advance her diet as tolerated.  -Tracey Walton should take small bites, thoroughly chew her food, take small sips of water while eating and stay upright for a least an hour after eating -Discussed that if Tracey Walton continues to have symptoms of some dysphagia or regurgitation then Tracey Walton should contact our clinic -Follow up with [facility] as needed    Tracey CHRISTELLA Rough, PA-C 12:03 PM 04/11/2024  "

## 2024-04-15 ENCOUNTER — Other Ambulatory Visit: Payer: Self-pay | Admitting: Internal Medicine

## 2024-04-15 DIAGNOSIS — I10 Essential (primary) hypertension: Secondary | ICD-10-CM

## 2024-04-16 MED ORDER — CARVEDILOL 6.25 MG PO TABS: 6.2500 mg | ORAL_TABLET | Freq: Two times a day (BID) | ORAL | 2 refills | Status: AC

## 2024-04-17 ENCOUNTER — Encounter: Payer: Self-pay | Admitting: Family Medicine

## 2024-04-17 ENCOUNTER — Other Ambulatory Visit: Payer: Self-pay | Admitting: Family Medicine

## 2024-04-17 DIAGNOSIS — L819 Disorder of pigmentation, unspecified: Secondary | ICD-10-CM

## 2024-04-25 ENCOUNTER — Telehealth: Payer: Self-pay

## 2024-04-25 NOTE — Telephone Encounter (Signed)
 Copied from CRM #[id number]. Topic: Referral - Prior Authorization Question >> Apr 25, 2024 11:29 AM Tonda B wrote: Reason for CRM: [facility] dermatology calling has questions about prior authorization for pt please call [phone] ask for [place]  Returned called from [place] she stated that the pt insurance plan [facility] required PCP to reach out thru they website for a PA  this is the provider that the pt will be seeing Dr. Devere Forward NPI [id number] DX CODE Z12.83 we can reach back out to  [place] once this PA is placed

## 2024-04-28 NOTE — Telephone Encounter (Signed)
 Contacted pt to have her call [employer] to update her pcp per the referral note.  She will send a message once this is completed and we will notify referral team

## 2024-04-29 ENCOUNTER — Encounter: Payer: Self-pay | Admitting: Family Medicine

## 2024-04-29 ENCOUNTER — Other Ambulatory Visit: Payer: Self-pay | Admitting: Family Medicine

## 2024-04-29 DIAGNOSIS — I350 Nonrheumatic aortic (valve) stenosis: Secondary | ICD-10-CM

## 2024-04-29 DIAGNOSIS — M545 Low back pain, unspecified: Secondary | ICD-10-CM

## 2024-05-01 NOTE — Progress Notes (Unsigned)
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 Valve:  Tricuspid aortic valve with severely reduced cusp excursion. Severely thickened and severely calcified aortic valve cusps.  AV calcium  score: 1565  Virtual Basal Annulus Measurements:  Maximum/Minimum Diameter: 25.9 x 22.5 mm mm  Perimeter: 75.3 mm  Area:  435 mm2  No significant LVOT calcifications.  Membranous septal length: 7.5 mm  Based on these measurements, the annulus would be suitable for a 26 mm Sapien 3 valve, however annulus sizing is borderline between upper limit of 23 mm and lower limit of 26 mm. Alternatively, Heart Team can consider 29mm Evolut valve. Recommend Heart Team discussion for valve selection.  Sinus of Valsalva Measurements:  Non-coronary:  33 mm  Right - coronary:  33 mm  Left - coronary:   32 mm  Coronary height and sinus of Valsalva Height:  Left main: 15.7 mm, Left sinus: 22.3 mm  Right coronary: 17 mm, Right sinus: 23 mm  Aorta: Variant branch pattern of aortic arch: Common origin of the brachiocephalic and left common carotid arteries.  Sinotubular Junction:  28 mm  Ascending Thoracic Aorta:  35 mm  Aortic Arch:  31 mm  Descending Thoracic Aorta:  27 mm  Coronary Arteries: Normal coronary origin. Right dominance. The study was performed without use of NTG and insufficient for plaque evaluation. Coronary artery calcium  seen in LM, LAD distribution.  Optimum Fluoroscopic Angle for Delivery: RAO 1 CRA 1  OTHER:  Atria: Dilated left atrium.  Left atrial appendage: No thrombus.  Mitral valve: Grossly normal, no mitral annular calcifications.  Pulmonary artery: Severely dilated main pulmonary artery, 44 mm, suggests pulmonary hypertension.  Pulmonary veins:  Normal anatomy.  Very large hiatal hernia.  IMPRESSION: 1. Tricuspid aortic valve with severely reduced cusp excursion. Severely thickened and severely calcified aortic valve cusps. 2. Aortic valve calcium  score: 1565 3. Annulus area: 435 mm2, suitable for 26 mm Sapien 3 valve however valve sizing is borderline between 23 mm and 26 mm, recommend heart team discussion for valve sizing. No LVOT calcifications. Membranous septal length 7.5 mm. 4. Sufficient coronary artery heights from annulus. 5. Optimum fluoroscopic angle for delivery: RAO 1 CRA 1 6. Severely dilated main pulmonary artery, 44 mm, suggests pulmonary hypertension. 7. Very large esophageal hiatal hernia.  Electronically Signed: By: Soyla Merck M.D. On: 10/29/2023 07:55     ______________________________________________________________________________________________      12/05/2023: Magnesium  1.8 12/07/2023: B Natriuretic Peptide 117.2 02/18/2024: ALT 11 02/21/2024: BUN 16; Creatinine, Ser 1.07; Hemoglobin 12.4;  Platelets 205; Potassium 4.6; Sodium 140       ASSESSMENT AND PLAN:   1. S/P TAVR (transcatheter aortic valve replacement)   2. Coronary artery disease involving native coronary artery of native heart without angina pectoris   3. Essential hypertension   4. Hyperlipidemia LDL goal <70   5. Stage 3a chronic kidney disease (HCC)      Status post TAVR: Reassuring valve function on 1 month echo in October 2025.  Continue aspirin  81 mg indefinitely.  Needs SBE prophylaxis. Coronary artery disease: Single-vessel diagonal disease.  Continue medical management.  Continue aspirin  81 mg, Coreg  6.25 mg twice daily, rosuvastatin  20 mg Hypertension: Continue amlodipine  5 mg, Coreg  6.25 mg twice daily and valsartan  40 mg.  *** Hyperlipidemia: Continue rosuvastatin  20 mg.  Check lipid panel and LFTs today.  Will defer LP(a) testing this advanced age individual CKD stage IIIa: Continue valsartan  40 mg; SGLT2 inhibitor?***   I have personally reviewed the patients imaging data as summarized above.  I have reviewed the natural history of aortic stenosis with the patient and family members who are present today. We have discussed the limitations of medical therapy and the poor prognosis associated with symptomatic aortic stenosis. We have also reviewed potential treatment options, including palliative medical therapy, conventional surgical aortic valve replacement, and transcatheter aortic valve replacement. We discussed treatment options in the context of this patient's specific comorbid medical conditions.   All of the patient's questions were answered today. Will make further recommendations based on the results of studies outlined above.   I spent *** minutes reviewing all clinical data during and prior to this visit including all relevant imaging studies, laboratories, clinical information from other health systems and prior notes from both Cardiology and other specialties, interviewing the patient,  conducting a complete physical examination, and coordinating care in order to formulate a comprehensive and personalized evaluation and treatment plan.   Hayleen Clinkscales K Tray Klayman, MD  05/01/2024 10:13 AM    [facility] [address] [place], [place], [place]  [address] Phone: [phone]; Fax: [phone]     "

## 2024-05-01 NOTE — Telephone Encounter (Signed)
 [facility] called saying patients referral is going to have to go through the [facility] for them to pay,  Questions you can call [facility] and when you get the PA please fx it to their office   Fax  [phone]  [phone]

## 2024-05-06 ENCOUNTER — Ambulatory Visit: Admitting: Internal Medicine

## 2024-05-06 DIAGNOSIS — E785 Hyperlipidemia, unspecified: Secondary | ICD-10-CM

## 2024-05-06 DIAGNOSIS — N1831 Chronic kidney disease, stage 3a: Secondary | ICD-10-CM

## 2024-05-06 DIAGNOSIS — I10 Essential (primary) hypertension: Secondary | ICD-10-CM

## 2024-05-06 DIAGNOSIS — Z952 Presence of prosthetic heart valve: Secondary | ICD-10-CM

## 2024-05-06 DIAGNOSIS — I2510 Atherosclerotic heart disease of native coronary artery without angina pectoris: Secondary | ICD-10-CM

## 2024-05-08 ENCOUNTER — Ambulatory Visit: Payer: Medicare Other

## 2024-10-17 ENCOUNTER — Ambulatory Visit: Admitting: Podiatry

## 2024-12-04 ENCOUNTER — Ambulatory Visit: Admitting: Physician Assistant

## 2024-12-04 ENCOUNTER — Other Ambulatory Visit (HOSPITAL_COMMUNITY)
